# Patient Record
Sex: Female | Born: 1985 | Hispanic: No | Marital: Single | Smoking: Never smoker
Health system: Southern US, Community
[De-identification: ages and names within clinical notes are randomized; demographics above are authoritative.]

## PROBLEM LIST (undated history)

## (undated) DIAGNOSIS — N926 Irregular menstruation, unspecified: Secondary | ICD-10-CM

## (undated) DIAGNOSIS — J45909 Unspecified asthma, uncomplicated: Secondary | ICD-10-CM

## (undated) DIAGNOSIS — Z8709 Personal history of other diseases of the respiratory system: Secondary | ICD-10-CM

## (undated) DIAGNOSIS — I871 Compression of vein: Secondary | ICD-10-CM

## (undated) DIAGNOSIS — Z87442 Personal history of urinary calculi: Secondary | ICD-10-CM

## (undated) DIAGNOSIS — Z86718 Personal history of other venous thrombosis and embolism: Secondary | ICD-10-CM

## (undated) HISTORY — PX: TUBAL LIGATION: SHX77

---

## 2013-11-24 ENCOUNTER — Ambulatory Visit: Payer: Medicaid Other | Admitting: Advanced Practice Midwife

## 2014-11-09 IMAGING — US US OB TRANSVAGINAL
1 series · 14 of 28 positions shown · non-contrast
Comparison: None for this pregnancy

CLINICAL DATA: 29-year-old pregnant female with lower abdominal
pain.

EXAM:
OBSTETRIC <14 WK US AND TRANSVAGINAL OB US
TECHNIQUE: Both transabdominal and transvaginal ultrasound examinations were
performed for complete evaluation of the gestation as well as the
maternal uterus, adnexal regions, and pelvic cul-de-sac.
Transvaginal technique was performed to assess early pregnancy.

[Series 1: us ob transvaginal · 0.15mm/px · 14 of 70 slices shown]
[im 3/70]
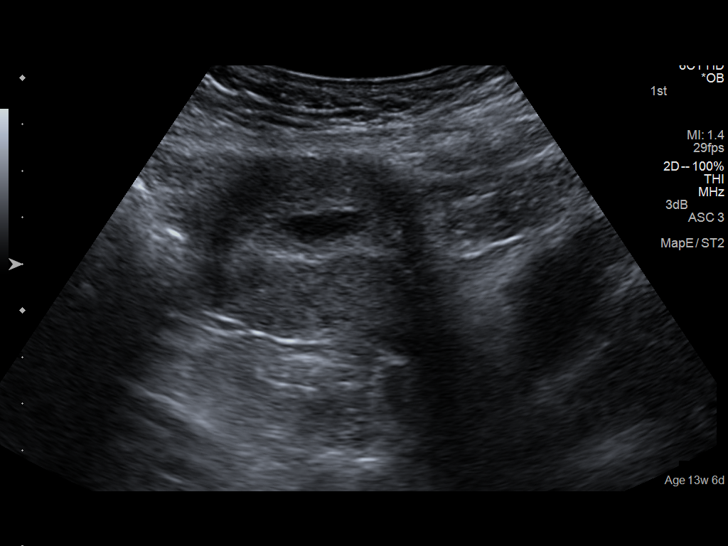
[im 8/70]
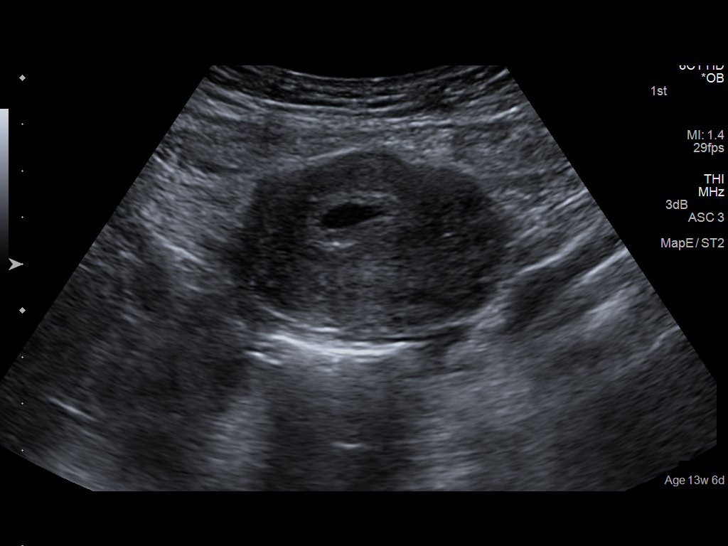
[im 13/70]
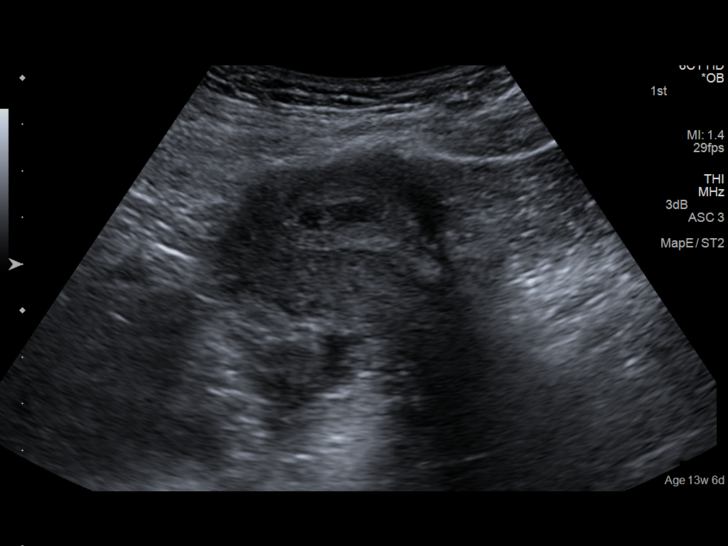
[im 18/70]
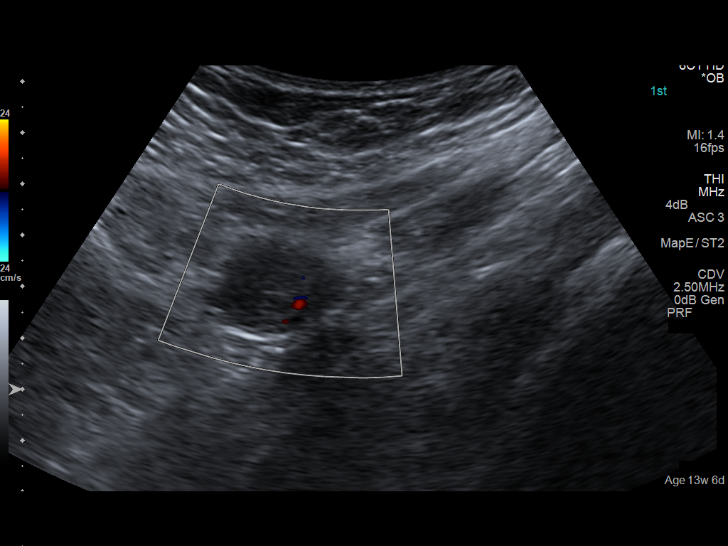
[im 24/70]
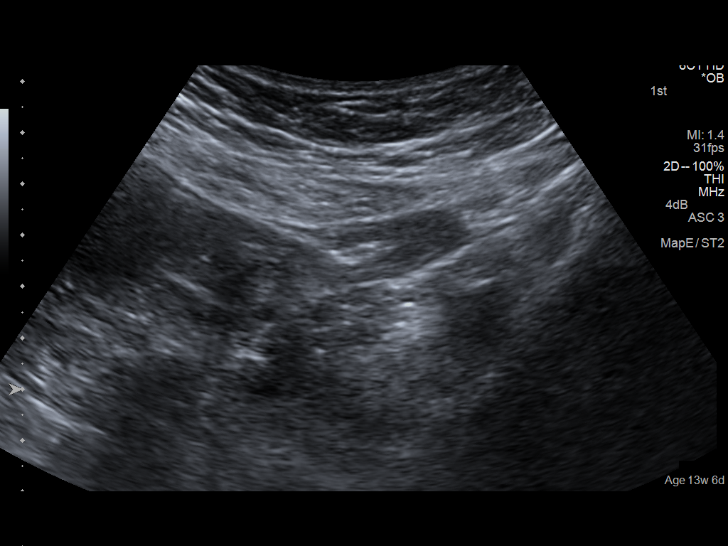
[im 29/70]
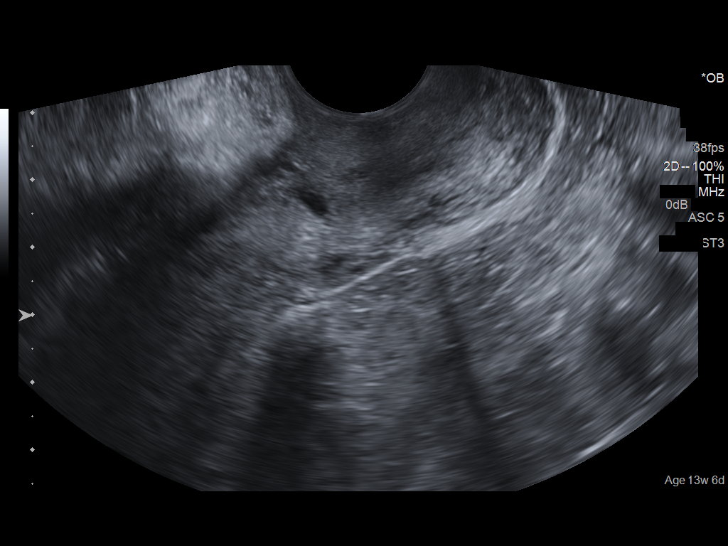
[im 34/70]
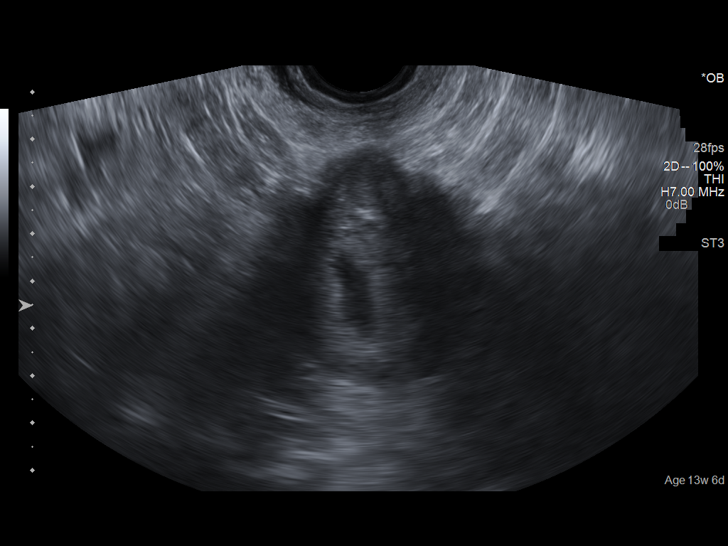
[im 39/70]
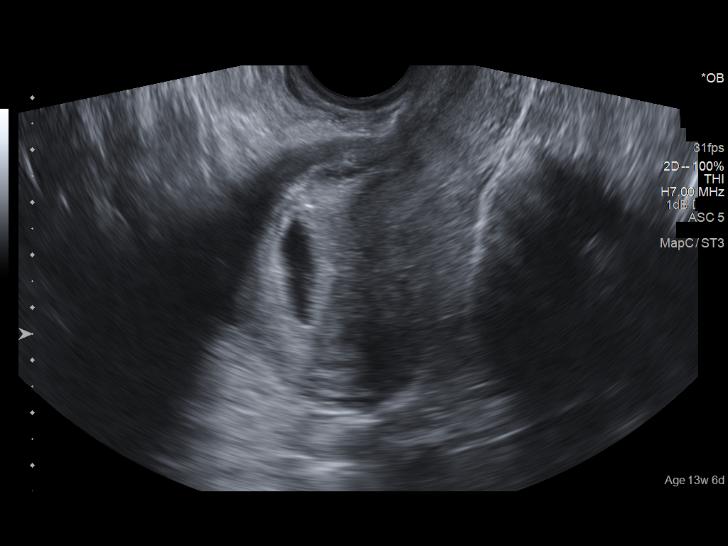
[im 44/70]
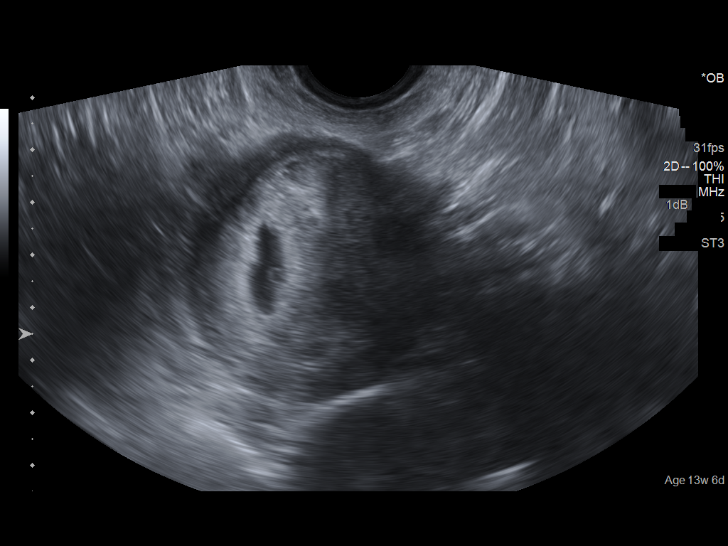
[im 49/70]
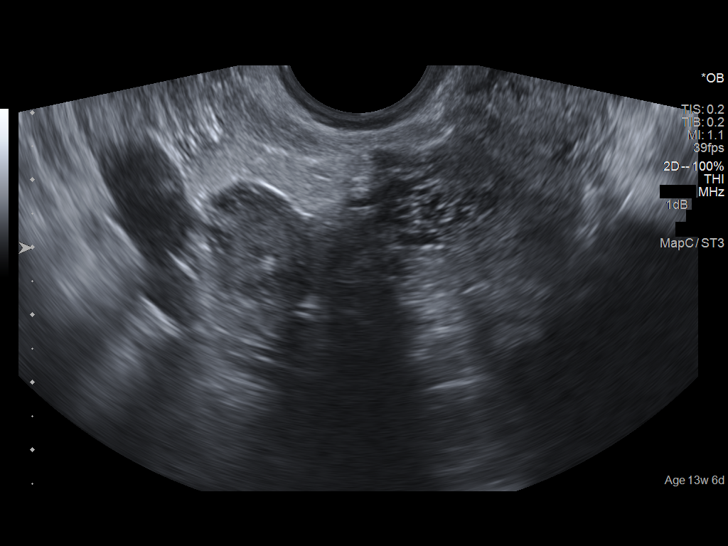
[im 54/70]
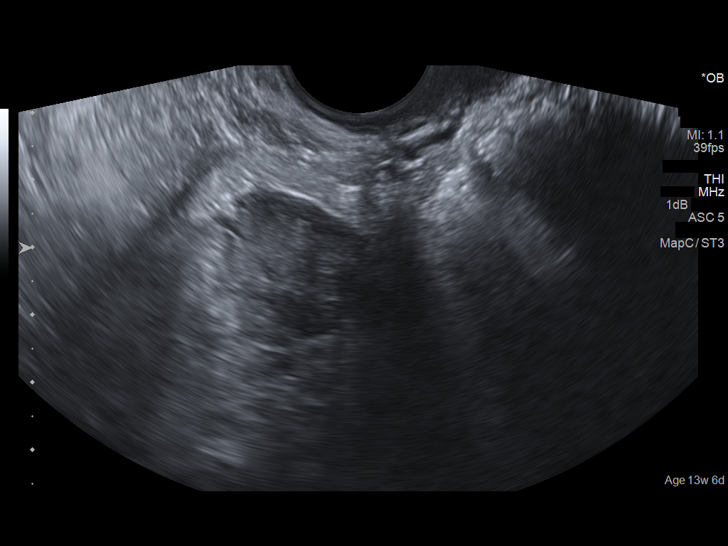
[im 59/70]
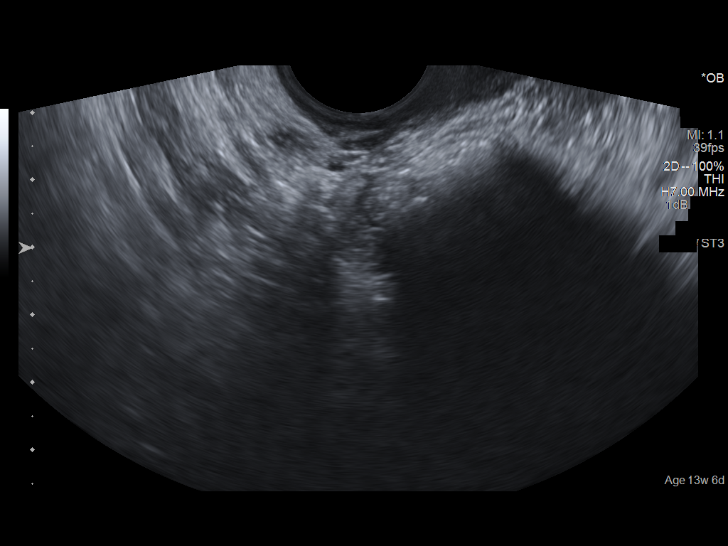
[im 64/70]
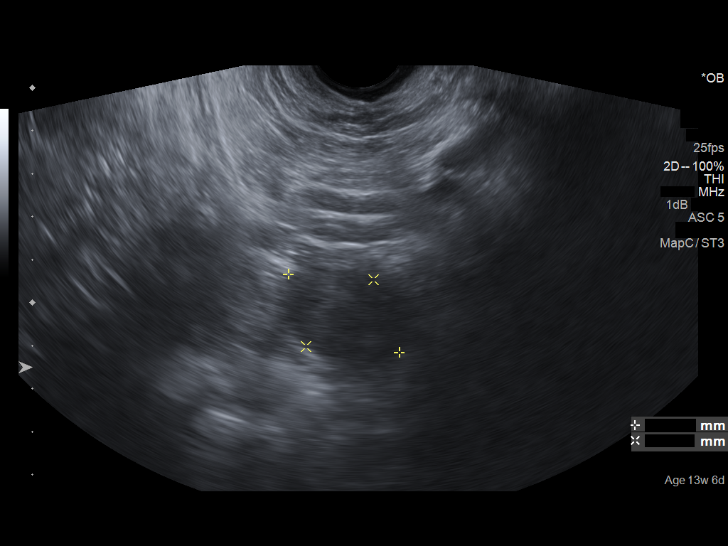
[im 70/70]
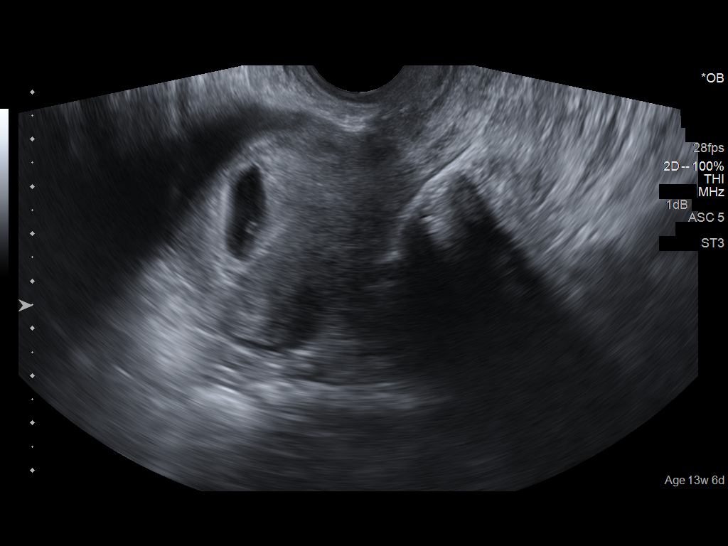

[14 of 28 positions shown; findings below may reference images not displayed]

FINDINGS: Intrauterine gestational sac: Visualized/normal in shape.

Yolk sac:  Visualized

Embryo:  Not seen

Cardiac Activity: NA

Heart Rate: NA  Bpm

MSD: 11   mm   5 w   6  d

Maternal uterus/adnexae: The uterus and the ovaries appear
unremarkable.
IMPRESSION: Single intrauterine gestational sac with an estimated gestational
age of 5 weeks, 6 days. No fetal pole identified at this time.
Follow-up recommended.

## 2014-12-01 IMAGING — US US OB COMP LESS 14 WK
1 series · 13 of 28 positions shown · non-contrast
Comparison: Seventy-[REDACTED]

CLINICAL DATA: 29-year-old pregnant female with vaginal bleeding
and pelvic pain. Estimated gestational age of 9 weeks 0 days by
first ultrasound.

EXAM:
OBSTETRIC <14 WK ULTRASOUND
TECHNIQUE: Transabdominal ultrasound was performed for evaluation of the
gestation as well as the maternal uterus and adnexal regions.

[Series 1: us ob comp less 14 wk · 0.20mm/px · 38 acquisitions, 13 frames shown]
[im 2/38]
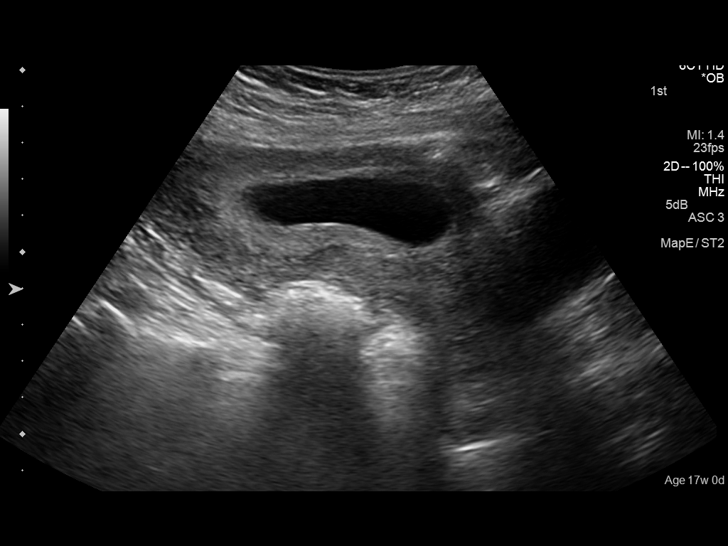
[im 5/38]
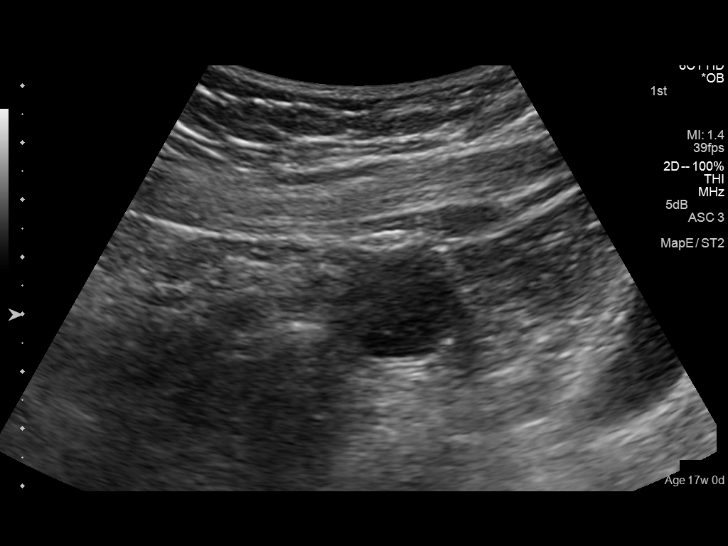
[im 7/38]
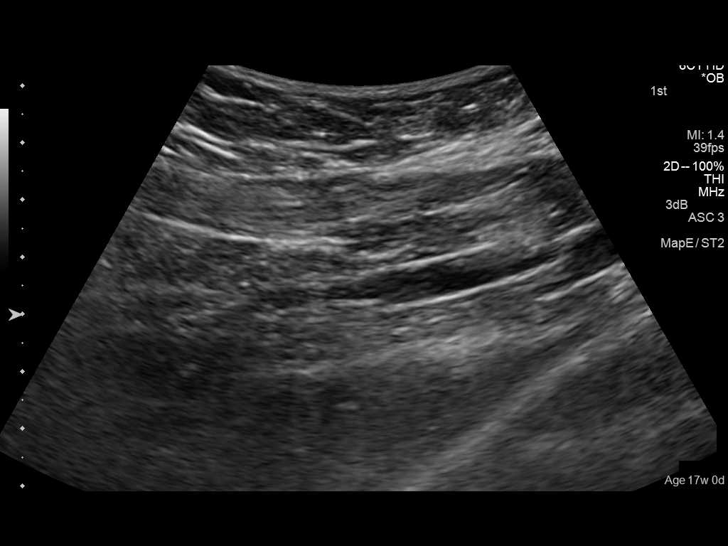
[im 10/38]
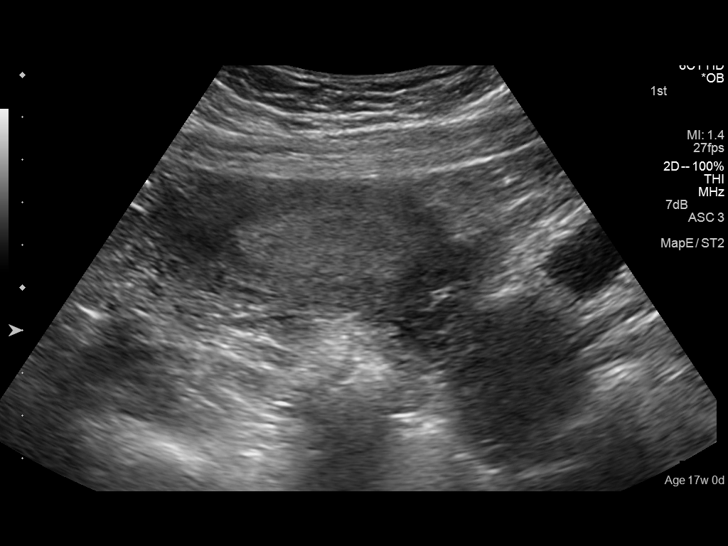
[im 13/38]
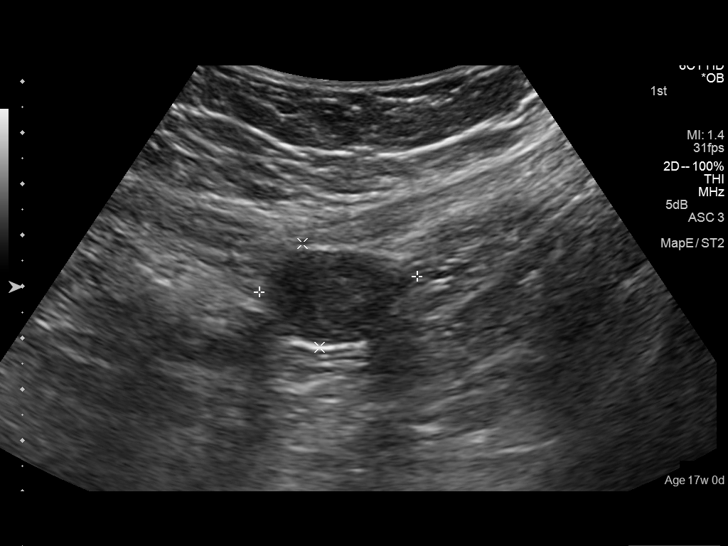
[im 16/38]
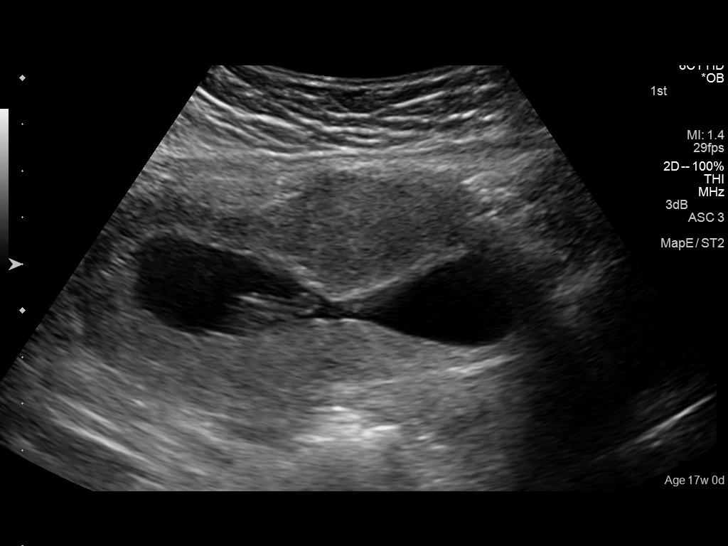
[im 20/38]
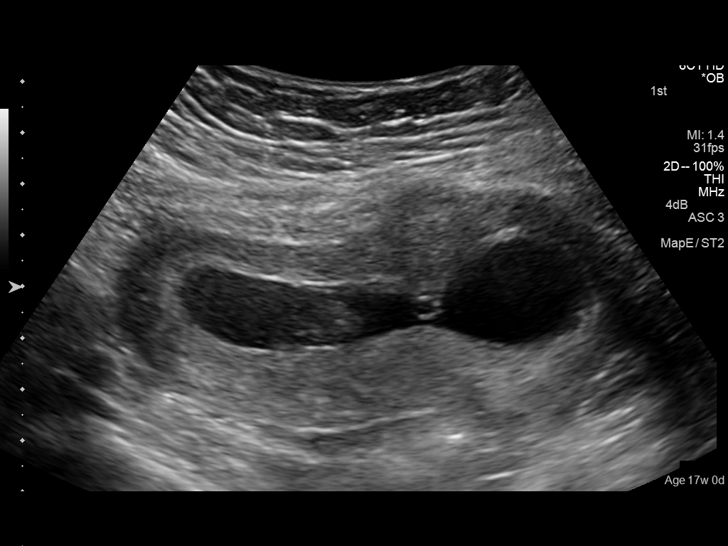
[im 22/38]
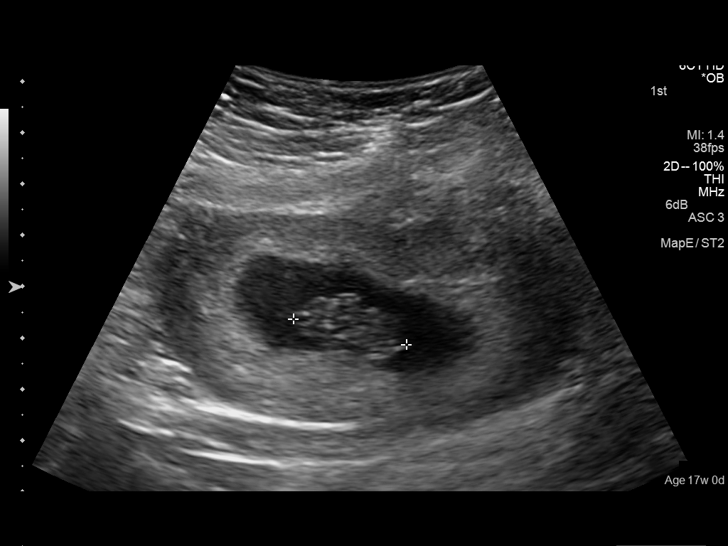
[im 25/38]
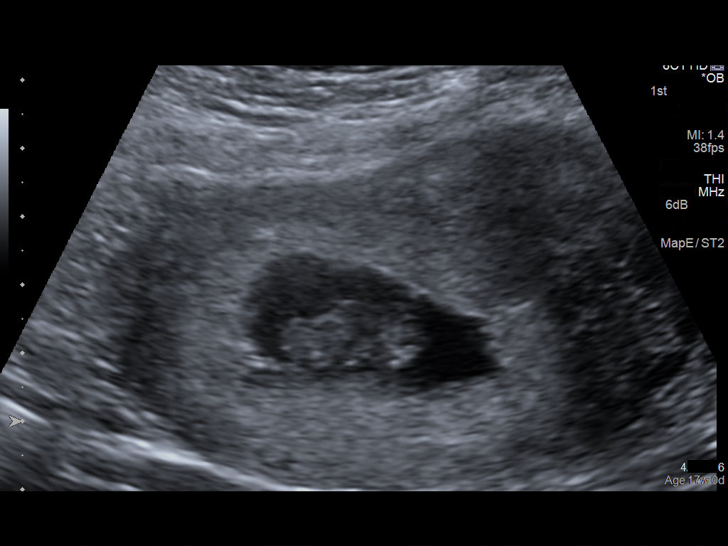
[im 28/38]
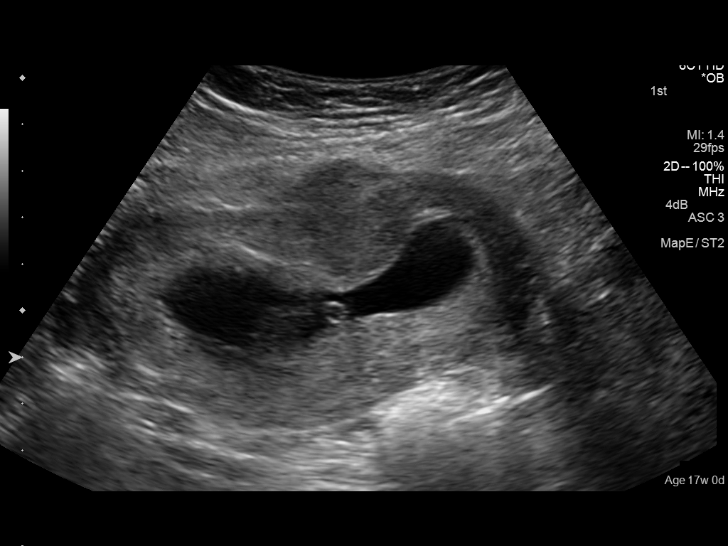
[im 31/38]
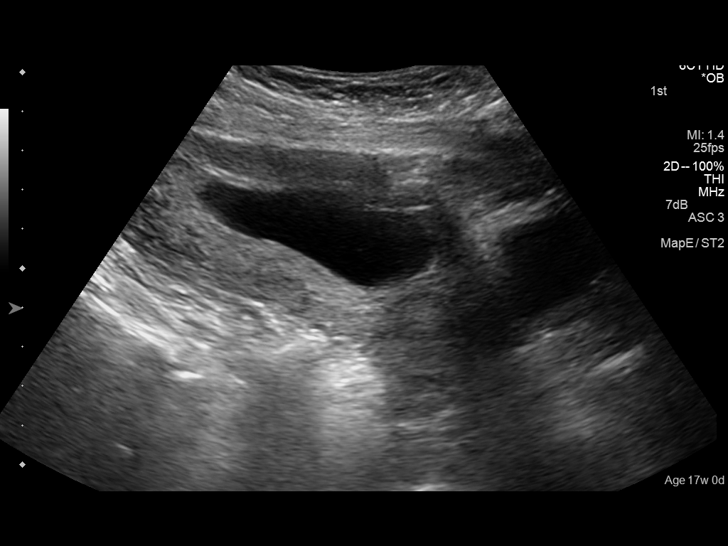
[im 33/38]
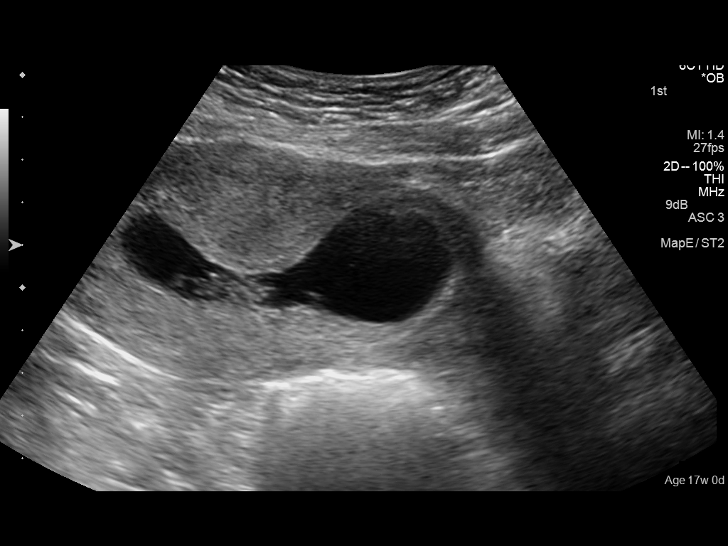
[im 36/38]
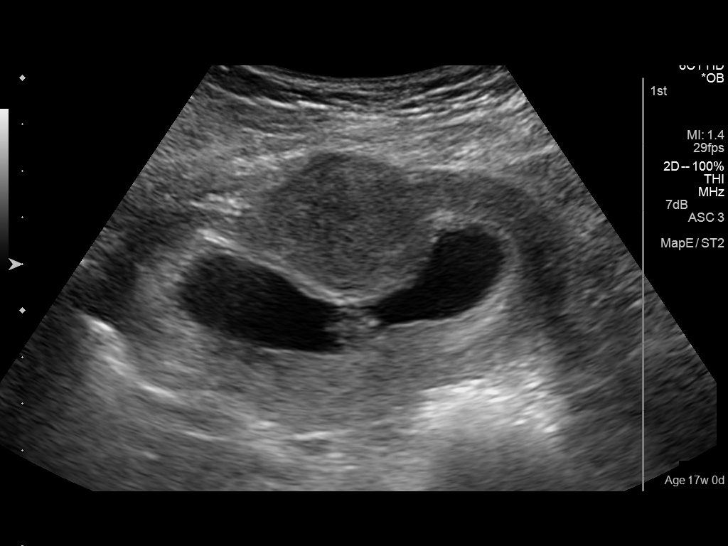

[13 of 28 positions shown; findings below may reference images not displayed]

FINDINGS: Intrauterine gestational sac: Visualized/normal in shape.

Yolk sac:  Present

Embryo:  Present

Cardiac Activity: Present

Heart Rate: 155 bpm

CRL:   22  mm   9 w 0 d                  US EDC: [DATE]

Maternal uterus/adnexae: There is no evidence of subchorionic
hemorrhage.

A 4.2 x 2.7 x 3.6 cm hypoechoic area within the anterior uterine
body does not change throughout the entire examination and most
compatible with intramural fibroid rather than a contraction.

Ovaries bilaterally are unremarkable.

No free fluid or adnexal mass identified.
IMPRESSION: Single living intrauterine gestation with assigned and estimated
gestational age of 9 weeks 0 days. No evidence of subchronic
hemorrhage.

4.2 x 2.7 x 3.8 cm probable anterior intramural intramural uterine
body fibroid. This could be further assessed on future scans.

## 2015-03-04 ENCOUNTER — Emergency Department (HOSPITAL_COMMUNITY): Payer: Medicaid Other

## 2015-03-04 ENCOUNTER — Emergency Department (HOSPITAL_COMMUNITY)
Admission: EM | Admit: 2015-03-04 | Discharge: 2015-03-04 | Disposition: A | Discharge status: Home / Self Care | Payer: Medicaid Other | Attending: Emergency Medicine | Admitting: Emergency Medicine

## 2015-03-04 ENCOUNTER — Encounter (HOSPITAL_COMMUNITY): Payer: Self-pay

## 2015-03-04 DIAGNOSIS — N202 Calculus of kidney with calculus of ureter: Secondary | ICD-10-CM | POA: Insufficient documentation

## 2015-03-04 DIAGNOSIS — N201 Calculus of ureter: Secondary | ICD-10-CM

## 2015-03-04 DIAGNOSIS — J45909 Unspecified asthma, uncomplicated: Secondary | ICD-10-CM | POA: Insufficient documentation

## 2015-03-04 DIAGNOSIS — Z3202 Encounter for pregnancy test, result negative: Secondary | ICD-10-CM | POA: Insufficient documentation

## 2015-03-04 DIAGNOSIS — R109 Unspecified abdominal pain: Secondary | ICD-10-CM | POA: Diagnosis present

## 2015-03-04 DIAGNOSIS — N200 Calculus of kidney: Secondary | ICD-10-CM

## 2015-03-04 HISTORY — DX: Unspecified asthma, uncomplicated: J45.909

## 2015-03-04 LAB — CBC WITH DIFFERENTIAL/PLATELET
BASOS PCT: 0 % (ref 0–1)
Basophils Absolute: 0 10*3/uL (ref 0.0–0.1)
EOS ABS: 0.2 10*3/uL (ref 0.0–0.7)
EOS PCT: 2 % (ref 0–5)
HCT: 39.6 % (ref 36.0–46.0)
HEMOGLOBIN: 13.2 g/dL (ref 12.0–15.0)
LYMPHS PCT: 33 % (ref 12–46)
Lymphs Abs: 3.3 10*3/uL (ref 0.7–4.0)
MCH: 31.6 pg (ref 26.0–34.0)
MCHC: 33.3 g/dL (ref 30.0–36.0)
MCV: 94.7 fL (ref 78.0–100.0)
MONO ABS: 1.1 10*3/uL — AB (ref 0.1–1.0)
MONOS PCT: 11 % (ref 3–12)
Neutro Abs: 5.5 10*3/uL (ref 1.7–7.7)
Neutrophils Relative %: 54 % (ref 43–77)
Platelets: 252 10*3/uL (ref 150–400)
RBC: 4.18 MIL/uL (ref 3.87–5.11)
RDW: 12.6 % (ref 11.5–15.5)
WBC: 10.1 10*3/uL (ref 4.0–10.5)

## 2015-03-04 LAB — URINALYSIS, ROUTINE W REFLEX MICROSCOPIC
Bilirubin Urine: NEGATIVE
GLUCOSE, UA: NEGATIVE mg/dL
Ketones, ur: NEGATIVE mg/dL
Leukocytes, UA: NEGATIVE
NITRITE: NEGATIVE
Protein, ur: NEGATIVE mg/dL
SPECIFIC GRAVITY, URINE: 1.02 (ref 1.005–1.030)
Urobilinogen, UA: 1 mg/dL (ref 0.0–1.0)
pH: 6.5 (ref 5.0–8.0)

## 2015-03-04 LAB — COMPREHENSIVE METABOLIC PANEL
ALBUMIN: 4.1 g/dL (ref 3.5–5.0)
ALT: 28 U/L (ref 14–54)
ANION GAP: 9 (ref 5–15)
AST: 23 U/L (ref 15–41)
Alkaline Phosphatase: 69 U/L (ref 38–126)
BUN: 11 mg/dL (ref 6–20)
CO2: 26 mmol/L (ref 22–32)
CREATININE: 0.66 mg/dL (ref 0.44–1.00)
Calcium: 9.1 mg/dL (ref 8.9–10.3)
Chloride: 106 mmol/L (ref 101–111)
GFR calc Af Amer: >60 mL/min (ref >60)
Glucose, Bld: 118 mg/dL — ABNORMAL HIGH (ref 65–99)
Potassium: 3.3 mmol/L — ABNORMAL LOW (ref 3.5–5.1)
Sodium: 141 mmol/L (ref 135–145)
TOTAL PROTEIN: 7.8 g/dL (ref 6.5–8.1)
Total Bilirubin: 1.2 mg/dL (ref 0.3–1.2)

## 2015-03-04 LAB — URINE MICROSCOPIC-ADD ON

## 2015-03-04 LAB — LIPASE, BLOOD: LIPASE: 23 U/L (ref 22–51)

## 2015-03-04 LAB — POC URINE PREG, ED: Preg Test, Ur: NEGATIVE

## 2015-03-04 IMAGING — CT CT RENAL STONE PROTOCOL
2 of 3 series · 16 of 34 positions shown, 18 images · non-contrast
Comparison: None.

CLINICAL DATA: Right flank pain and dysuria beginning this morning.
History of kidney stones.

EXAM:
CT ABDOMEN AND PELVIS WITHOUT CONTRAST
TECHNIQUE: Multidetector CT imaging of the abdomen and pelvis was performed
following the standard protocol without IV contrast.

[Series 3: lung · axial · 0.62mm/px · z∈[-130,-45]mm · 13 of 20 slices shown, 15 images]
[im 2/20  soft-tissue]
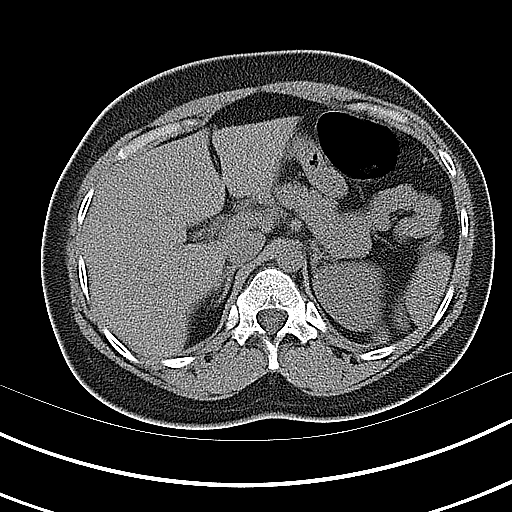
[im 2/20  bone]
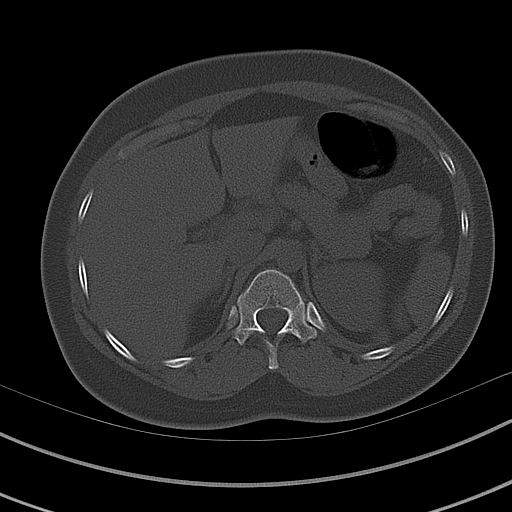
[im 4/20  soft-tissue]
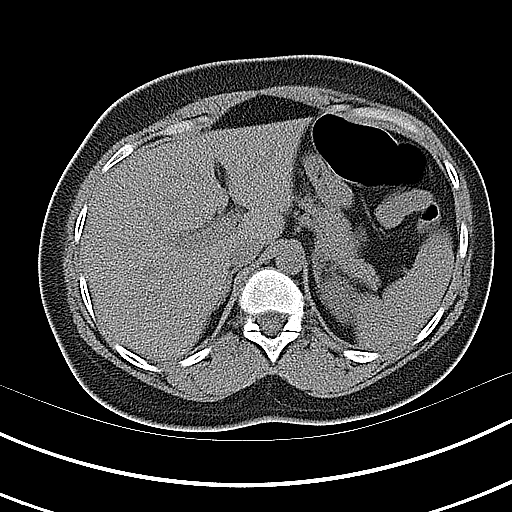
[im 5/20  soft-tissue]
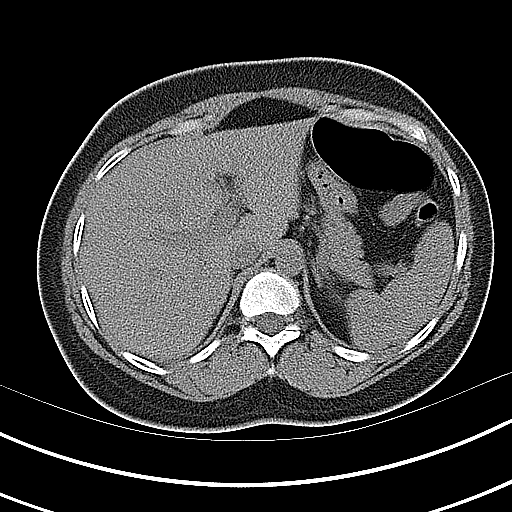
[im 6/20  soft-tissue]
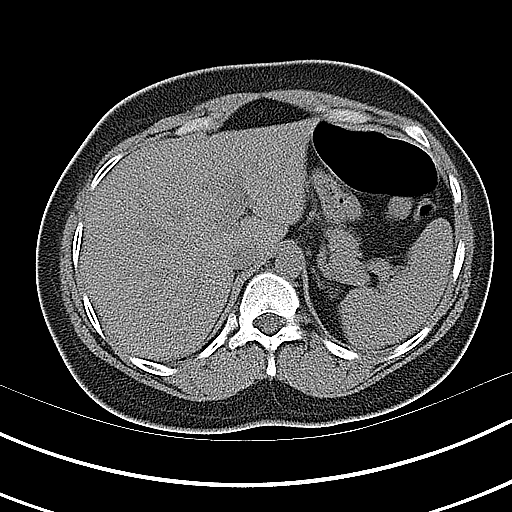
[im 8/20  soft-tissue]
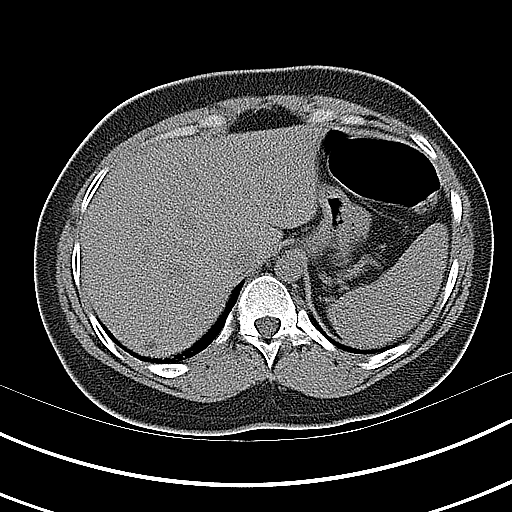
[im 9/20  soft-tissue]
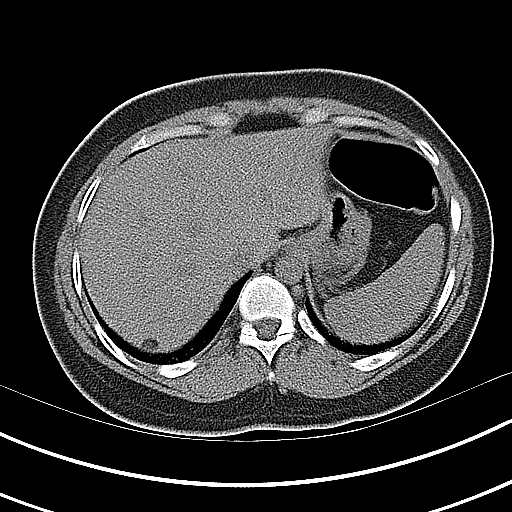
[im 11/20  soft-tissue]
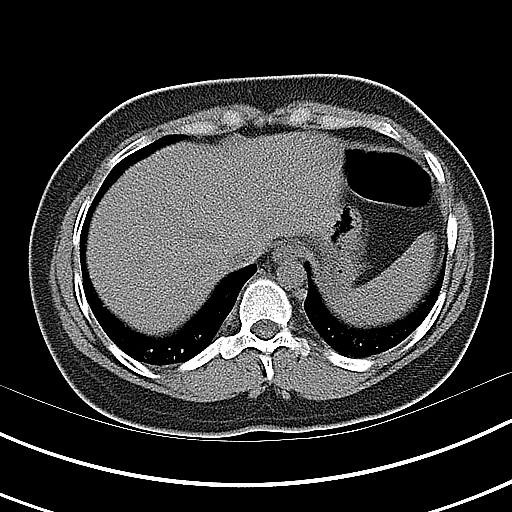
[im 12/20  soft-tissue]
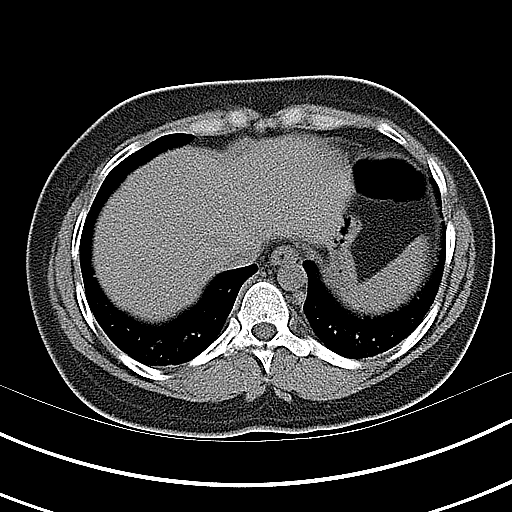
[im 13/20  soft-tissue]
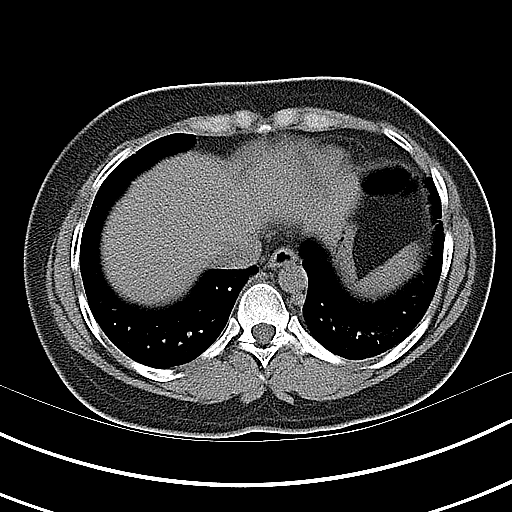
[im 13/20  bone]
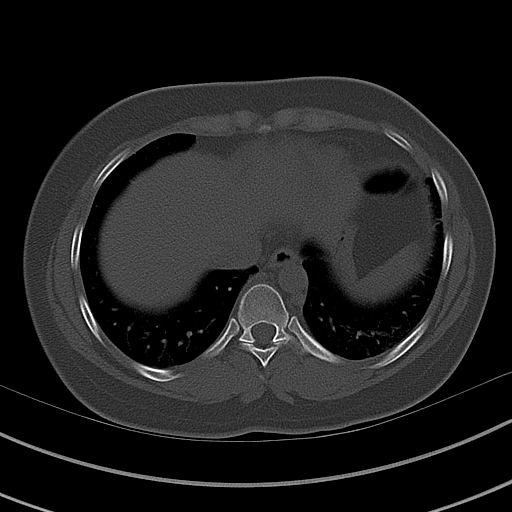
[im 15/20  soft-tissue]
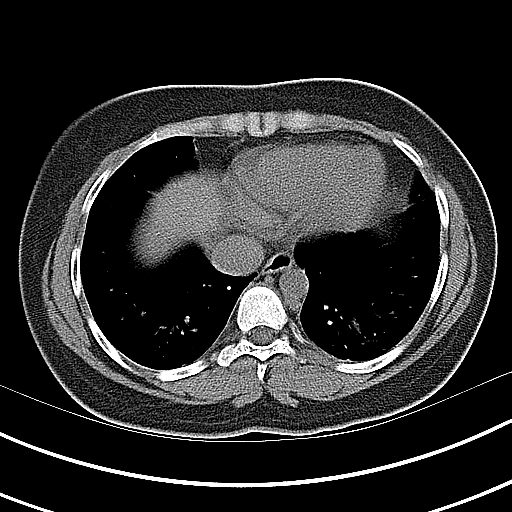
[im 16/20  soft-tissue]
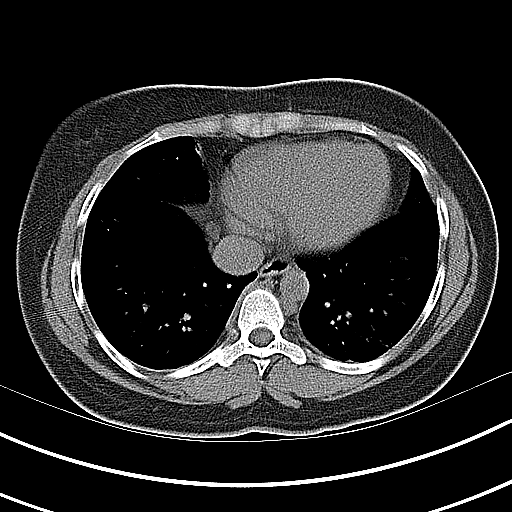
[im 17/20  soft-tissue]
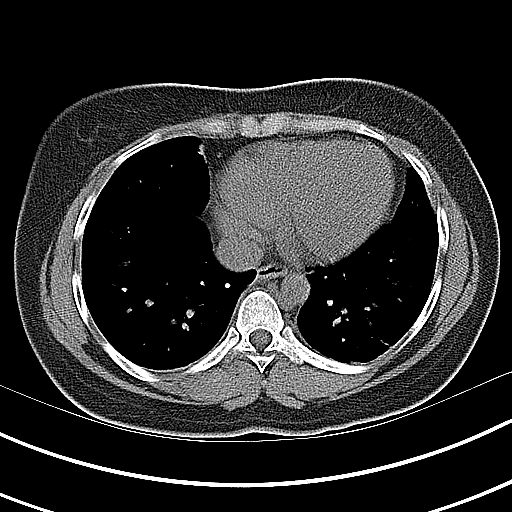
[im 19/20  soft-tissue]
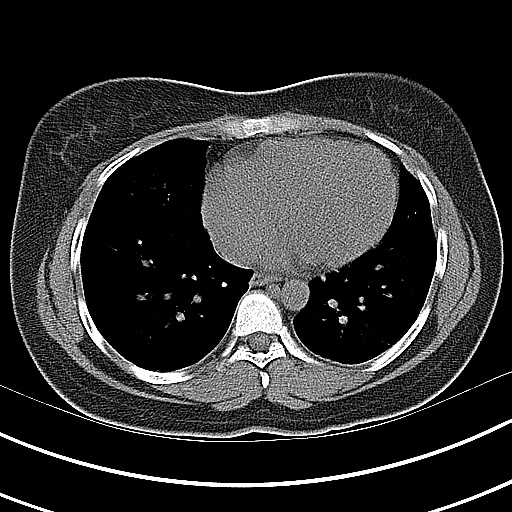

[Series 4: coronal · coronal · 0.68mm/px · 3 of 101 slices shown]
[im 34/101  soft-tissue]
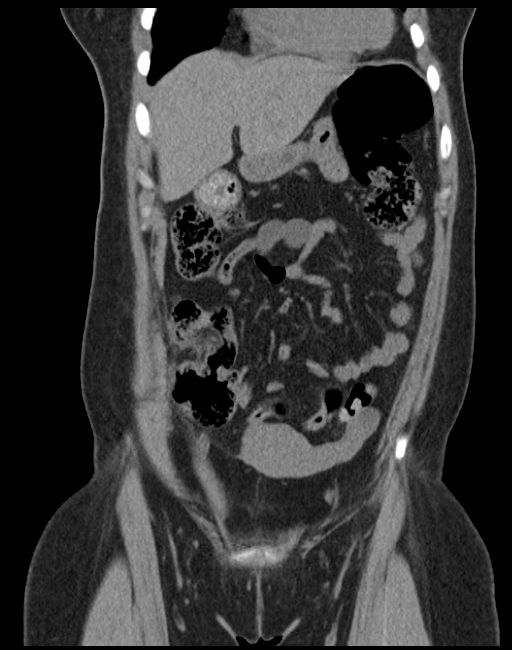
[im 45/101  soft-tissue]
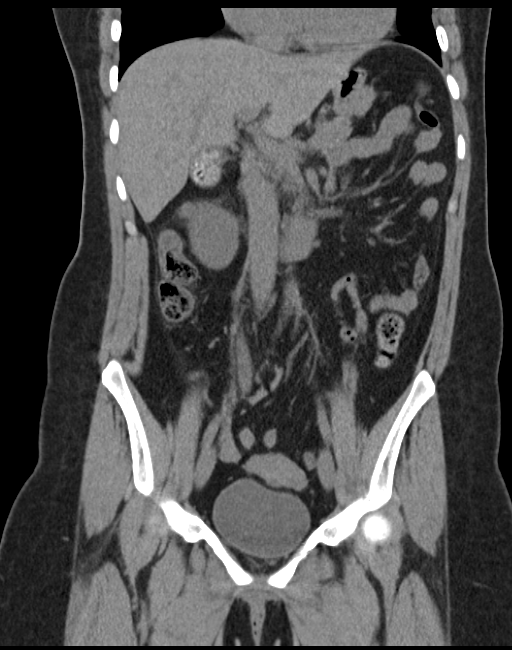
[im 56/101  soft-tissue]
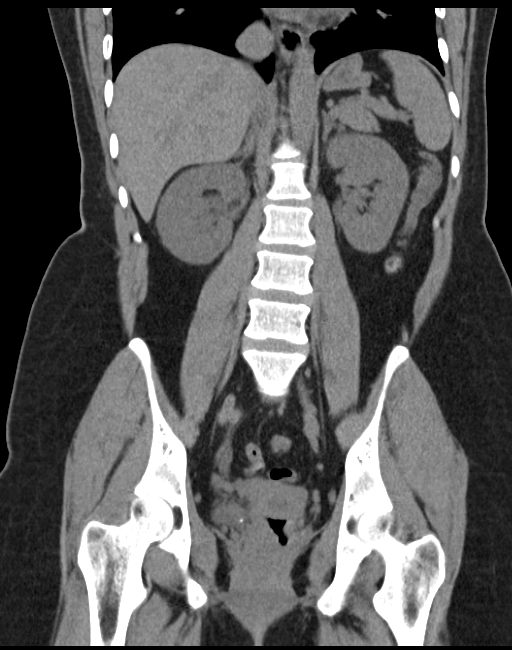

[16 of 34 positions shown; findings below may reference images not displayed]

FINDINGS: BODY WALL: No contributory findings.

LOWER CHEST: No contributory findings.

ABDOMEN/PELVIS:

Liver: Incidental 9 mm capsule pseudo lipoma posteriorly.

Biliary: Numerous gallstones. No evidence of biliary obstruction or
inflammation.

Pancreas: Unremarkable.

Spleen: Unremarkable.

Adrenals: Unremarkable.

Kidneys and ureters: Moderate right hydroureteronephrosis secondary
to a 2 mm stone at the right ureteral vesicular junction. 2 mm stone
also seen in the interpolar left kidney.

Bladder: Unremarkable.

Reproductive: The uterine fundus appears adhesed to the abdominal
wall, likely from previous Cesarean section.

Bowel: No obstruction. No appendicitis.

Retroperitoneum: No mass or adenopathy.

Peritoneum: No ascites or pneumoperitoneum.

Vascular: No acute abnormality.

OSSEOUS: No acute abnormalities.
IMPRESSION: 1. Obstructing 2 mm stone at the right ureteral vesicular junction.
2. 2 mm left renal calculus.
3. Cholelithiasis.

## 2015-03-04 MED ORDER — MORPHINE SULFATE 4 MG/ML IJ SOLN
4.0000 mg | Freq: Once | INTRAMUSCULAR | Status: AC
  Administered 2015-03-04: 4 mg via INTRAVENOUS
  Filled 2015-03-04: qty 1

## 2015-03-04 MED ORDER — FENTANYL CITRATE (PF) 100 MCG/2ML IJ SOLN
25.0000 ug | Freq: Once | INTRAMUSCULAR | Status: DC
  Filled 2015-03-04: qty 2

## 2015-03-04 MED ORDER — ONDANSETRON HCL 4 MG/2ML IJ SOLN
4.0000 mg | Freq: Once | INTRAMUSCULAR | Status: AC
  Administered 2015-03-04: 4 mg via INTRAVENOUS
  Filled 2015-03-04: qty 2

## 2015-03-04 MED ORDER — OXYCODONE-ACETAMINOPHEN 5-325 MG PO TABS: 2.0000 | ORAL_TABLET | ORAL | Status: DC | PRN

## 2015-03-04 MED ORDER — TAMSULOSIN HCL 0.4 MG PO CAPS: 0.4000 mg | ORAL_CAPSULE | Freq: Two times a day (BID) | ORAL | Status: DC

## 2015-03-04 MED ORDER — HYDROMORPHONE HCL 1 MG/ML IJ SOLN
1.0000 mg | Freq: Once | INTRAMUSCULAR | Status: AC
  Administered 2015-03-04: 1 mg via INTRAVENOUS
  Filled 2015-03-04: qty 1

## 2015-03-04 MED ORDER — ONDANSETRON 4 MG PO TBDP: ORAL_TABLET | ORAL | Status: DC

## 2015-03-04 NOTE — ED Provider Notes (Signed)
CSN: [id number]     Arrival date & time 03/04/15  [id number] History   None    Chief Complaint  Patient presents with  . Flank Pain     (Consider location/radiation/quality/duration/timing/severity/associated sxs/prior Treatment) HPI Paula Warner is a 29 y.o. female who comes in for evaluation of constant right-sided flank pain. Patient states his pain started at approximately 4 AM. She characterizes sensation is a tightness. She rates the pain as 10/10. She reports having a history of kidney stones and is unsure if this pain is similar. She denies dysuria, but does report "I feel like I have to strain to pee". She denies any abdominal pain, vaginal bleeding or discharge. She has not tried anything to improve her symptoms. Nothing makes it better or worse. She does endorse difficulty finding a comfortable position.  Past Medical History  Diagnosis Date  . Asthma    History reviewed. No pertinent past surgical history. History reviewed. No pertinent family history. History  Substance Use Topics  . Smoking status: Never Smoker   . Smokeless tobacco: Not on file  . Alcohol Use: No   OB History    No data available     Review of Systems A 10 point review of systems was completed and was negative except for pertinent positives and negatives as mentioned in the history of present illness    Allergies  Review of patient's allergies indicates no known allergies.  Home Medications   Prior to Admission medications   Not on File   BP 136/97 mmHg  Pulse 98  Resp 20  SpO2 100%  LMP 02/01/2015 Physical Exam  Constitutional: She is oriented to person, place, and time. She appears well-developed and well-nourished.  HENT:  Head: Normocephalic and atraumatic.  Mouth/Throat: Oropharynx is clear and moist.  Eyes: Conjunctivae are normal. Pupils are equal, round, and reactive to light. Right eye exhibits no discharge. Left eye exhibits no discharge. No scleral icterus.  Neck: Neck supple.   Cardiovascular: Normal rate, regular rhythm and normal heart sounds.   Pulmonary/Chest: Effort normal and breath sounds normal. No respiratory distress. She has no wheezes. She has no rales.  Abdominal: Soft. There is no tenderness.  No CVA tenderness. Abdomen is soft, nondistended without tenderness, rebound or guarding. No lesions, masses or other deformities noted.  Musculoskeletal: She exhibits no tenderness.  No obvious lesions or deformities over right flank. No tenderness to palpation.  Neurological: She is alert and oriented to person, place, and time.  Cranial Nerves II-XII grossly intact  Skin: Skin is warm and dry. No rash noted.  Psychiatric: She has a normal mood and affect.  Nursing note and vitals reviewed.   ED Course  Procedures (including critical care time) Labs Review Labs Reviewed  CBC WITH DIFFERENTIAL/PLATELET - Abnormal; Notable for the following:    Monocytes Absolute 1.1 (*)    All other components within normal limits  COMPREHENSIVE METABOLIC PANEL - Abnormal; Notable for the following:    Potassium 3.3 (*)    Glucose, Bld 118 (*)    All other components within normal limits  URINALYSIS, ROUTINE W REFLEX MICROSCOPIC (NOT AT [facility]) - Abnormal; Notable for the following:    APPearance CLOUDY (*)    Hgb urine dipstick MODERATE (*)    All other components within normal limits  URINE MICROSCOPIC-ADD ON - Abnormal; Notable for the following:    Squamous Epithelial / LPF MANY (*)    All other components within normal limits  LIPASE, BLOOD  POC  URINE PREG, ED    Imaging Review Ct Renal Stone Study  03/04/2015   CLINICAL DATA:  Right flank pain and dysuria beginning this morning. History of kidney stones.  EXAM: CT ABDOMEN AND PELVIS WITHOUT CONTRAST  TECHNIQUE: Multidetector CT imaging of the abdomen and pelvis was performed following the standard protocol without IV contrast.  COMPARISON:  None.  FINDINGS: BODY WALL: No contributory findings.  LOWER CHEST: No  contributory findings.  ABDOMEN/PELVIS:  Liver: Incidental 9 mm capsule pseudo lipoma posteriorly.  Biliary: Numerous gallstones. No evidence of biliary obstruction or inflammation.  Pancreas: Unremarkable.  Spleen: Unremarkable.  Adrenals: Unremarkable.  Kidneys and ureters: Moderate right hydroureteronephrosis secondary to a 2 mm stone at the right ureteral vesicular junction. 2 mm stone also seen in the interpolar left kidney.  Bladder: Unremarkable.  Reproductive: The uterine fundus appears adhesed to the abdominal wall, likely from previous Cesarean section.  Bowel: No obstruction. No appendicitis.  Retroperitoneum: No mass or adenopathy.  Peritoneum: No ascites or pneumoperitoneum.  Vascular: No acute abnormality.  OSSEOUS: No acute abnormalities.  IMPRESSION: 1. Obstructing 2 mm stone at the right ureteral vesicular junction. 2. 2 mm left renal calculus. 3. Cholelithiasis.   Electronically Signed   By: Monte Fantasia M.D.   On: 03/04/2015 08:11     EKG Interpretation None     Meds given in ED:  Medications  ondansetron [facility]) injection 4 mg (4 mg Intravenous Given 03/04/15 0617)  morphine 4 MG/ML injection 4 mg (4 mg Intravenous Given 03/04/15 0617)  HYDROmorphone (DILAUDID) injection 1 mg (1 mg Intravenous Given 03/04/15 0654)    New Prescriptions   No medications on file   Filed Vitals:   03/04/15 0547  BP: 136/97  Pulse: 98  Resp: 20  SpO2: 100%    MDM  Vitals stable - WNL -afebrile Pt resting comfortably in ED. Pain controlled in ED and pt feels much better PE--benign abdominal exam. No CVA tenderness. Person on physical exam. Labwork--hemoglobin evident on urinalysis. No UTI. Labs otherwise noncontributory. Imaging--CT renal stone study shows 2 mm obstructing stone at the right UVJ, 2 mm stone also seen in the interpolar left kidney. Also evidence of leak cholelithiasis.  DDX--patient presents with right-sided flank pain found to have 2 mm obstructing stone at right UVJ.  No evidence of urosepsis. Vital signs stable. Pain controlled in the ED. We'll discharge with pain medicines, antiemetics and referral to follow up with urology. No evidence of other acute or emergent pathology at this time.  I discussed all relevant lab findings and imaging results with pt and they verbalized understanding. Discussed f/u with PCP within 48 hrs and return precautions, pt very amenable to plan.  Final diagnoses:  Ureterolithiasis  Nephrolith       Comer Locket, PA-C 03/04/15 2100  Merryl Hacker, MD 03/05/15 [phone]

## 2015-03-04 NOTE — ED Notes (Signed)
Pt complains of right flank pain that started a few hours ago, hx of kidney stones and she states this pain is different. Pt is nauseated and is currently vomiting

## 2015-03-04 NOTE — ED Notes (Signed)
Patient ambulatory from bathroom back to patient room. C/o no relief of flank pain.

## 2015-03-04 NOTE — Discharge Instructions (Signed)
You were evaluated in the ER today for your right-sided flank pain. Your found to have kidney stones. It is important for you to take these medications as prescribed. Please follow-up with urology for further evaluation and management of your symptoms. Return to ED for new or worsening symptoms.  Kidney Stones Kidney stones (urolithiasis) are solid masses that form inside your kidneys. The intense pain is caused by the stone moving through the kidney, ureter, bladder, and urethra (urinary tract). When the stone moves, the ureter starts to spasm around the stone. The stone is usually passed in your pee (urine).  HOME CARE  Drink enough fluids to keep your pee clear or pale yellow. This helps to get the stone out.  Strain all pee through the provided strainer. Do not pee without peeing through the strainer, not even once. If you pee the stone out, catch it in the strainer. The stone may be as small as a grain of salt. Take this to your doctor. This will help your doctor figure out what you can do to try to prevent more kidney stones.  Only take medicine as told by your doctor.  Follow up with your doctor as told.  Get follow-up X-rays as told by your doctor. GET HELP IF: You have pain that gets worse even if you have been taking pain medicine. GET HELP RIGHT AWAY IF:   Your pain does not get better with medicine.  You have a fever or shaking chills.  Your pain increases and gets worse over 18 hours.  You have new belly (abdominal) pain.  You feel faint or pass out.  You are unable to pee. MAKE SURE YOU:   Understand these instructions.  Will watch your condition.  Will get help right away if you are not doing well or get worse. Document Released: 02/27/2008 Document Revised: 05/13/2013 Document Reviewed: 02/11/2013 [facility] Patient Information 2015 [place], [place]. This information is not intended to replace advice given to you by your health care provider. Make sure you discuss  any questions you have with your health care provider.

## 2015-04-01 ENCOUNTER — Emergency Department (HOSPITAL_COMMUNITY)
Admission: EM | Admit: 2015-04-01 | Discharge: 2015-04-01 | Disposition: A | Discharge status: Home / Self Care | Payer: Medicaid Other | Attending: Emergency Medicine | Admitting: Emergency Medicine

## 2015-04-01 ENCOUNTER — Emergency Department (HOSPITAL_COMMUNITY): Payer: Medicaid Other

## 2015-04-01 ENCOUNTER — Encounter (HOSPITAL_COMMUNITY): Payer: Self-pay | Admitting: Emergency Medicine

## 2015-04-01 DIAGNOSIS — R109 Unspecified abdominal pain: Secondary | ICD-10-CM | POA: Insufficient documentation

## 2015-04-01 DIAGNOSIS — O210 Mild hyperemesis gravidarum: Secondary | ICD-10-CM | POA: Diagnosis not present

## 2015-04-01 DIAGNOSIS — Z3A01 Less than 8 weeks gestation of pregnancy: Secondary | ICD-10-CM | POA: Diagnosis not present

## 2015-04-01 DIAGNOSIS — O99511 Diseases of the respiratory system complicating pregnancy, first trimester: Secondary | ICD-10-CM | POA: Diagnosis not present

## 2015-04-01 DIAGNOSIS — Z3490 Encounter for supervision of normal pregnancy, unspecified, unspecified trimester: Secondary | ICD-10-CM

## 2015-04-01 DIAGNOSIS — R110 Nausea: Secondary | ICD-10-CM

## 2015-04-01 DIAGNOSIS — J45909 Unspecified asthma, uncomplicated: Secondary | ICD-10-CM | POA: Insufficient documentation

## 2015-04-01 DIAGNOSIS — O9989 Other specified diseases and conditions complicating pregnancy, childbirth and the puerperium: Secondary | ICD-10-CM | POA: Diagnosis present

## 2015-04-01 LAB — URINALYSIS, ROUTINE W REFLEX MICROSCOPIC
BILIRUBIN URINE: NEGATIVE
Glucose, UA: NEGATIVE mg/dL
Hgb urine dipstick: NEGATIVE
NITRITE: NEGATIVE
Protein, ur: NEGATIVE mg/dL
Specific Gravity, Urine: 1.027 (ref 1.005–1.030)
Urobilinogen, UA: 1 mg/dL (ref 0.0–1.0)
pH: 6 (ref 5.0–8.0)

## 2015-04-01 LAB — URINE MICROSCOPIC-ADD ON

## 2015-04-01 LAB — WET PREP, GENITAL
Clue Cells Wet Prep HPF POC: NONE SEEN
TRICH WET PREP: NONE SEEN
Yeast Wet Prep HPF POC: NONE SEEN

## 2015-04-01 LAB — HCG, QUANTITATIVE, PREGNANCY: hCG, Beta Chain, Quant, S: 26354 m[IU]/mL — ABNORMAL HIGH (ref <5)

## 2015-04-01 LAB — I-STAT BETA HCG BLOOD, ED (MC, WL, AP ONLY): I-stat hCG, quantitative: >2000 m[IU]/mL — ABNORMAL HIGH (ref <5)

## 2015-04-01 LAB — POC URINE PREG, ED: PREG TEST UR: POSITIVE — AB

## 2015-04-01 NOTE — ED Notes (Signed)
MD and PA at bedside with ultrasound

## 2015-04-01 NOTE — Discharge Instructions (Signed)
Your ultrasound showed a 5 week 6 day fetus in your uterus but no heart beat today. This needs to be followed up with at the [facility] outpatient clinic.   First Trimester of Pregnancy The first trimester of pregnancy is from week 1 until the end of week 12 (months 1 through 3). A week after a sperm fertilizes an egg, the egg will implant on the wall of the uterus. This embryo will begin to develop into a baby. Genes from you and your partner are forming the baby. The female genes determine whether the baby is a boy or a girl. At 6-8 weeks, the eyes and face are formed, and the heartbeat can be seen on ultrasound. At the end of 12 weeks, all the baby's organs are formed.  Now that you are pregnant, you will want to do everything you can to have a healthy baby. Two of the most important things are to get good prenatal care and to follow your health care provider's instructions. Prenatal care is all the medical care you receive before the baby's birth. This care will help prevent, find, and treat any problems during the pregnancy and childbirth. BODY CHANGES Your body goes through many changes during pregnancy. The changes vary from woman to woman.   You may gain or lose a couple of pounds at first.  You may feel sick to your stomach (nauseous) and throw up (vomit). If the vomiting is uncontrollable, call your health care provider.  You may tire easily.  You may develop headaches that can be relieved by medicines approved by your health care provider.  You may urinate more often. Painful urination may mean you have a bladder infection.  You may develop heartburn as a result of your pregnancy.  You may develop constipation because certain hormones are causing the muscles that push waste through your intestines to slow down.  You may develop hemorrhoids or swollen, bulging veins (varicose veins).  Your breasts may begin to grow larger and become tender. Your nipples may stick out more, and the tissue  that surrounds them (areola) may become darker.  Your gums may bleed and may be sensitive to brushing and flossing.  Dark spots or blotches (chloasma, mask of pregnancy) may develop on your face. This will likely fade after the baby is born.  Your menstrual periods will stop.  You may have a loss of appetite.  You may develop cravings for certain kinds of food.  You may have changes in your emotions from day to day, such as being excited to be pregnant or being concerned that something may go wrong with the pregnancy and baby.  You may have more vivid and strange dreams.  You may have changes in your hair. These can include thickening of your hair, rapid growth, and changes in texture. Some women also have hair loss during or after pregnancy, or hair that feels dry or thin. Your hair will most likely return to normal after your baby is born. WHAT TO EXPECT AT YOUR PRENATAL VISITS During a routine prenatal visit:  You will be weighed to make sure you and the baby are growing normally.  Your blood pressure will be taken.  Your abdomen will be measured to track your baby's growth.  The fetal heartbeat will be listened to starting around week 10 or 12 of your pregnancy.  Test results from any previous visits will be discussed. Your health care provider may ask you:  How you are feeling.  If you  are feeling the baby move.  If you have had any abnormal symptoms, such as leaking fluid, bleeding, severe headaches, or abdominal cramping.  If you have any questions. Other tests that may be performed during your first trimester include:  Blood tests to find your blood type and to check for the presence of any previous infections. They will also be used to check for low iron levels (anemia) and Rh antibodies. Later in the pregnancy, blood tests for diabetes will be done along with other tests if problems develop.  Urine tests to check for infections, diabetes, or protein in the  urine.  An ultrasound to confirm the proper growth and development of the baby.  An amniocentesis to check for possible genetic problems.  Fetal screens for spina bifida and Down syndrome.  You may need other tests to make sure you and the baby are doing well. HOME CARE INSTRUCTIONS  Medicines  Follow your health care provider's instructions regarding medicine use. Specific medicines may be either safe or unsafe to take during pregnancy.  Take your prenatal vitamins as directed.  If you develop constipation, try taking a stool softener if your health care provider approves. Diet  Eat regular, well-balanced meals. Choose a variety of foods, such as meat or vegetable-based protein, fish, milk and low-fat dairy products, vegetables, fruits, and whole grain breads and cereals. Your health care provider will help you determine the amount of weight gain that is right for you.  Avoid raw meat and uncooked cheese. These carry germs that can cause birth defects in the baby.  Eating four or five small meals rather than three large meals a day may help relieve nausea and vomiting. If you start to feel nauseous, eating a few soda crackers can be helpful. Drinking liquids between meals instead of during meals also seems to help nausea and vomiting.  If you develop constipation, eat more high-fiber foods, such as fresh vegetables or fruit and whole grains. Drink enough fluids to keep your urine clear or pale yellow. Activity and Exercise  Exercise only as directed by your health care provider. Exercising will help you:  Control your weight.  Stay in shape.  Be prepared for labor and delivery.  Experiencing pain or cramping in the lower abdomen or low back is a good sign that you should stop exercising. Check with your health care provider before continuing normal exercises.  Try to avoid standing for long periods of time. Move your legs often if you must stand in one place for a long  time.  Avoid heavy lifting.  Wear low-heeled shoes, and practice good posture.  You may continue to have sex unless your health care provider directs you otherwise. Relief of Pain or Discomfort  Wear a good support bra for breast tenderness.   Take warm sitz baths to soothe any pain or discomfort caused by hemorrhoids. Use hemorrhoid cream if your health care provider approves.   Rest with your legs elevated if you have leg cramps or low back pain.  If you develop varicose veins in your legs, wear support hose. Elevate your feet for 15 minutes, 3-4 times a day. Limit salt in your diet. Prenatal Care  Schedule your prenatal visits by the twelfth week of pregnancy. They are usually scheduled monthly at first, then more often in the last 2 months before delivery.  Write down your questions. Take them to your prenatal visits.  Keep all your prenatal visits as directed by your health care provider. Safety  Wear your seat belt at all times when driving.  Make a list of emergency phone numbers, including numbers for family, friends, the hospital, and police and fire departments. General Tips  Ask your health care provider for a referral to a local prenatal education class. Begin classes no later than at the beginning of month 6 of your pregnancy.  Ask for help if you have counseling or nutritional needs during pregnancy. Your health care provider can offer advice or refer you to specialists for help with various needs.  Do not use hot tubs, steam rooms, or saunas.  Do not douche or use tampons or scented sanitary pads.  Do not cross your legs for long periods of time.  Avoid cat litter boxes and soil used by cats. These carry germs that can cause birth defects in the baby and possibly loss of the fetus by miscarriage or stillbirth.  Avoid all smoking, herbs, alcohol, and medicines not prescribed by your health care provider. Chemicals in these affect the formation and growth of  the baby.  Schedule a dentist appointment. At home, brush your teeth with a soft toothbrush and be gentle when you floss. SEEK MEDICAL CARE IF:   You have dizziness.  You have mild pelvic cramps, pelvic pressure, or nagging pain in the abdominal area.  You have persistent nausea, vomiting, or diarrhea.  You have a bad smelling vaginal discharge.  You have pain with urination.  You notice increased swelling in your face, hands, legs, or ankles. SEEK IMMEDIATE MEDICAL CARE IF:   You have a fever.  You are leaking fluid from your vagina.  You have spotting or bleeding from your vagina.  You have severe abdominal cramping or pain.  You have rapid weight gain or loss.  You vomit blood or material that looks like coffee grounds.  You are exposed to [place] measles and have never had them.  You are exposed to fifth disease or chickenpox.  You develop a severe headache.  You have shortness of breath.  You have any kind of trauma, such as from a fall or a car accident. Document Released: 09/04/2001 Document Revised: 01/25/2014 Document Reviewed: 07/21/2013 [facility] Patient Information 2015 [place], [place]. This information is not intended to replace advice given to you by your health care provider. Make sure you discuss any questions you have with your health care provider. Morning Sickness Morning sickness is when you feel sick to your stomach (nauseous) during pregnancy. This nauseous feeling may or may not come with vomiting. It often occurs in the morning but can be a problem any time of day. Morning sickness is most common during the first trimester, but it may continue throughout pregnancy. While morning sickness is unpleasant, it is usually harmless unless you develop severe and continual vomiting (hyperemesis gravidarum). This condition requires more intense treatment.  CAUSES  The cause of morning sickness is not completely known but seems to be related to normal hormonal  changes that occur in pregnancy. RISK FACTORS You are at greater risk if you:  Experienced nausea or vomiting before your pregnancy.  Had morning sickness during a previous pregnancy.  Are pregnant with more than one baby, such as twins. TREATMENT  Do not use any medicines (prescription, over-the-counter, or herbal) for morning sickness without first talking to your health care provider. Your health care provider may prescribe or recommend:  Vitamin B6 supplements.  Anti-nausea medicines.  The herbal medicine ginger. HOME CARE INSTRUCTIONS   Only take over-the-counter or prescription medicines  as directed by your health care provider.  Taking multivitamins before getting pregnant can prevent or decrease the severity of morning sickness in most women.  Eat a piece of dry toast or unsalted crackers before getting out of bed in the morning.  Eat five or six small meals a day.  Eat dry and bland foods (rice, baked potato). Foods high in carbohydrates are often helpful.  Do not drink liquids with your meals. Drink liquids between meals.  Avoid greasy, fatty, and spicy foods.  Get someone to cook for you if the smell of any food causes nausea and vomiting.  If you feel nauseous after taking prenatal vitamins, take the vitamins at night or with a snack.  Snack on protein foods (nuts, yogurt, cheese) between meals if you are hungry.  Eat unsweetened gelatins for desserts.  Wearing an acupressure wristband (worn for sea sickness) may be helpful.  Acupuncture may be helpful.  Do not smoke.  Get a humidifier to keep the air in your house free of odors.  Get plenty of fresh air. SEEK MEDICAL CARE IF:   Your home remedies are not working, and you need medicine.  You feel dizzy or lightheaded.  You are losing weight. SEEK IMMEDIATE MEDICAL CARE IF:   You have persistent and uncontrolled nausea and vomiting.  You pass out (faint). MAKE SURE YOU:  Understand these  instructions.  Will watch your condition.  Will get help right away if you are not doing well or get worse. Document Released: 11/01/2006 Document Revised: 09/15/2013 Document Reviewed: 02/25/2013 [facility] Patient Information 2015 [place], [place]. This information is not intended to replace advice given to you by your health care provider. Make sure you discuss any questions you have with your health care provider.

## 2015-04-01 NOTE — ED Notes (Signed)
Pt sts lower abd pain and nausea; pt sts LMP was in April and had positive pregnancy test yesterday; pt sts G7 P2 A2 M2

## 2015-04-01 NOTE — ED Provider Notes (Signed)
CSN: [id number]     Arrival date & time 04/01/15  1242 History   First MD Initiated Contact with Patient 04/01/15 1316     Chief Complaint  Patient presents with  . Abdominal Pain  . Possible Pregnancy  . Nausea   Paula Warner is a 29 y.o. female who is [id number] who presents to the ED complaining of intermittent nausea and abdominal pain for the past three days. Patient reports she's been feeling nauseated, worse in the mornings, over the past 3 days. She also complains of bilateral lower abdominal pain intermittently for the past 3 days. She reports she has not had a menstrual cycle since April and is not on birth control. She reports she is sexually active and does not use protection. She reports nausea but no vomiting. She reports that currently she has no abdominal pain, however she had some earlier today. She is unsure of her blood type, but reports she has never required rhogam before. The patient denies fevers, chills, urinary symptoms, hematuria, vaginal bleeding, vaginal discharge, diarrhea, constipation, vomiting or rashes.   (Consider location/radiation/quality/duration/timing/severity/associated sxs/prior Treatment) HPI  Past Medical History  Diagnosis Date  . Asthma    History reviewed. No pertinent past surgical history. History reviewed. No pertinent family history. History  Substance Use Topics  . Smoking status: Never Smoker   . Smokeless tobacco: Not on file  . Alcohol Use: No   OB History    No data available     Review of Systems  Constitutional: Negative for fever, chills and appetite change.  HENT: Negative for congestion and sore throat.   Eyes: Negative for visual disturbance.  Respiratory: Negative for cough, shortness of breath and wheezing.   Cardiovascular: Negative for chest pain and palpitations.  Gastrointestinal: Positive for nausea and abdominal pain. Negative for vomiting, diarrhea and blood in stool.  Genitourinary: Negative for dysuria,  urgency, frequency, hematuria, vaginal bleeding, vaginal discharge, difficulty urinating and pelvic pain.  Musculoskeletal: Negative for back pain and neck pain.  Skin: Negative for rash.  Neurological: Negative for dizziness, syncope, weakness, light-headedness and headaches.      Allergies  Review of patient's allergies indicates no known allergies.  Home Medications   Prior to Admission medications   Medication Sig Start Date End Date Taking? Authorizing Provider  ondansetron (ZOFRAN ODT) 4 MG disintegrating tablet 4mg  ODT q4 hours prn nausea/vomit Patient not taking: Reported on 04/01/2015 03/04/15   Comer Locket, PA-C  oxyCODONE-acetaminophen (PERCOCET) 5-325 MG per tablet Take 2 tablets by mouth every 4 (four) hours as needed. Patient not taking: Reported on 04/01/2015 03/04/15   Comer Locket, PA-C  tamsulosin (FLOMAX) 0.4 MG CAPS capsule Take 1 capsule (0.4 mg total) by mouth 2 (two) times daily. Patient not taking: Reported on 04/01/2015 03/04/15   Comer Locket, PA-C   BP 112/64 mmHg  Pulse 69  Temp(Src) 97.7 F (36.5 C) (Oral)  Resp 20  SpO2 100%  LMP 02/01/2015 Physical Exam  Constitutional: She is oriented to person, place, and time. She appears well-developed and well-nourished. No distress.  Nontoxic appearing.  HENT:  Head: Normocephalic and atraumatic.  Mouth/Throat: Oropharynx is clear and moist. No oropharyngeal exudate.  Eyes: Conjunctivae are normal. Pupils are equal, round, and reactive to light. Right eye exhibits no discharge. Left eye exhibits no discharge.  Neck: Neck supple.  Cardiovascular: Normal rate, regular rhythm, normal heart sounds and intact distal pulses.  Exam reveals no gallop and no friction rub.   No murmur heard. Pulmonary/Chest: Effort  normal and breath sounds normal. No respiratory distress. She has no wheezes. She has no rales.  Abdominal: Soft. Bowel sounds are normal. She exhibits no distension and no mass. There is no  tenderness. There is no rebound and no guarding.  Abdomen is soft and nontender to palpation. Bowel sounds are present. No peritoneal signs.  Genitourinary:  Pelvic exam performed by me with female nurse tech as chaperone. No external lesions are noted. No vaginal bleeding. Cervix is closed. No cervical motion tenderness. No adnexal tenderness or fullness. No uterine tenderness.   Musculoskeletal: She exhibits no edema.  Lymphadenopathy:    She has no cervical adenopathy.  Neurological: She is alert and oriented to person, place, and time. Coordination normal.  Skin: Skin is warm and dry. No rash noted. She is not diaphoretic. No erythema. No pallor.  Psychiatric: She has a normal mood and affect. Her behavior is normal.  Nursing note and vitals reviewed.   ED Course  Procedures (including critical care time) Labs Review Labs Reviewed  WET PREP, GENITAL - Abnormal; Notable for the following:    WBC, Wet Prep HPF POC FEW (*)    All other components within normal limits  URINALYSIS, ROUTINE W REFLEX MICROSCOPIC (NOT AT [facility]) - Abnormal; Notable for the following:    Color, Urine AMBER (*)    APPearance HAZY (*)    Ketones, ur >80 (*)    Leukocytes, UA SMALL (*)    All other components within normal limits  URINE MICROSCOPIC-ADD ON - Abnormal; Notable for the following:    Squamous Epithelial / LPF MANY (*)    Bacteria, UA MANY (*)    All other components within normal limits  POC URINE PREG, ED - Abnormal; Notable for the following:    Preg Test, Ur POSITIVE (*)    All other components within normal limits  I-STAT BETA HCG BLOOD, ED ([facility], [facility], [facility] ONLY) - Abnormal; Notable for the following:    I-stat hCG, quantitative >2000.0 (*)    All other components within normal limits  HIV ANTIBODY (ROUTINE TESTING)  RPR  HCG, QUANTITATIVE, PREGNANCY  GC/CHLAMYDIA PROBE AMP ([place]) NOT AT [facility]    Imaging Review US Ob Comp Less 14 Wks  04/01/2015   CLINICAL DATA:  29 year old  pregnant female with lower abdominal pain.  EXAM: OBSTETRIC <14 WK [place] AND TRANSVAGINAL OB US  TECHNIQUE: Both transabdominal and transvaginal ultrasound examinations were performed for complete evaluation of the gestation as well as the maternal uterus, adnexal regions, and pelvic cul-de-sac. Transvaginal technique was performed to assess early pregnancy.  COMPARISON:  None for this pregnancy  FINDINGS: Intrauterine gestational sac: Visualized/normal in shape.  Yolk sac:  Visualized  Embryo:  Not seen  Cardiac Activity: NA  Heart Rate: NA  Bpm  MSD: 11   mm   5 w   6  d  Maternal uterus/adnexae: The uterus and the ovaries appear unremarkable.  IMPRESSION: Single intrauterine gestational sac with an estimated gestational age of [redacted] weeks, 6 days. No fetal pole identified at this time. Follow-up recommended.   Electronically Signed   By: Anner Crete M.D.   On: 04/01/2015 15:09   US Ob Transvaginal  04/01/2015   CLINICAL DATA:  29 year old pregnant female with lower abdominal pain.  EXAM: OBSTETRIC <14 WK [place] AND TRANSVAGINAL OB US  TECHNIQUE: Both transabdominal and transvaginal ultrasound examinations were performed for complete evaluation of the gestation as well as the maternal uterus, adnexal regions, and pelvic cul-de-sac.  Transvaginal technique was performed to assess early pregnancy.  COMPARISON:  None for this pregnancy  FINDINGS: Intrauterine gestational sac: Visualized/normal in shape.  Yolk sac:  Visualized  Embryo:  Not seen  Cardiac Activity: NA  Heart Rate: NA  Bpm  MSD: 11   mm   5 w   6  d  Maternal uterus/adnexae: The uterus and the ovaries appear unremarkable.  IMPRESSION: Single intrauterine gestational sac with an estimated gestational age of [redacted] weeks, 6 days. No fetal pole identified at this time. Follow-up recommended.   Electronically Signed   By: Anner Crete M.D.   On: 04/01/2015 15:09     EKG Interpretation None      Filed Vitals:   04/01/15 1345 04/01/15 1400 04/01/15 1415  04/01/15 1605  BP: 107/68 116/70 122/68 112/64  Pulse: 65 90 78 69  Temp:      TempSrc:      Resp:    20  SpO2: 98% 100% 100% 100%     MDM   Final diagnoses:  Pregnancy  Nausea without vomiting   This  is a 29 y.o. female who is [id number] who presents to the ED complaining of intermittent nausea and abdominal pain for the past three days. Patient reports she's been feeling nauseated, worse in the mornings, over the past 3 days. She also complains of bilateral lower abdominal pain intermittently for the past 3 days. She reports she has not had a menstrual cycle since April and is not on birth control. She reports she is sexually active and does not use protection. She reports nausea but no vomiting. She denies current abdominal pain. On exam patient is afebrile and nontoxic appearing. Her abdomen is soft and nontender to palpation. Patient had a negative urine pregnancy test 4 weeks ago. Today she has a positive urine pregnancy test even i-STAT hCG greater than 2000. Quantitative hCG is pending. She is unsure of her blood type that she is never required rhogam.  in her previous pregnancies or abortions. Urinalysis shows small leukocytes with many squamous epithelial. She denies urinary symptoms. Wet prep is unremarkable. Pelvic exam [place] only for moderate amount of white vaginal discharge. Cervix is closed. No cervical motion tenderness. Patient has pending HIV, RPR, gonorrhea and chlamydia. Pelvic ultrasound indicated single intrauterine gestational sac with an estimated gestational age of [redacted] weeks and 6 days. No fetal pole is identified at this time. Will recommend follow-up for the patient with the women's outpatient clinic. The patient is in agreement and understanding with the reason for follow-up. Prior to discharge she denies nausea or abdominal pain. I discussed symptomatic treatment of her nausea during pregnancy. Education provided on first trimester pregnancy. I advised the patient to  follow-up with the women's outpatient clinic. Strict return precautions provided. I advised the patient to return to the emergency department with new or worsening symptoms or new concerns. The patient verbalized understanding and agreement with plan.  This patient was discussed with and evaluated by Dr. Maryan Rued who agrees with assessment and plan.       Paula Pean, PA-C 04/01/15 1627  Blanchie Dessert, MD 04/04/15 [phone]

## 2015-04-01 NOTE — ED Notes (Signed)
Patient and family are requesting orange juice.  RN made pt aware that she would not be able to eat or drink until she goes for ultrasound.  Pt verbalizes understanding.  Orange juice provided to children.

## 2015-04-02 LAB — RPR: RPR Ser Ql: NONREACTIVE

## 2015-04-02 LAB — HIV ANTIBODY (ROUTINE TESTING W REFLEX): HIV Screen 4th Generation wRfx: NONREACTIVE

## 2015-04-04 LAB — GC/CHLAMYDIA PROBE AMP (~~LOC~~) NOT AT ARMC
Chlamydia: NEGATIVE
Neisseria Gonorrhea: NEGATIVE

## 2015-04-23 ENCOUNTER — Emergency Department (HOSPITAL_COMMUNITY): Payer: Medicaid Other

## 2015-04-23 ENCOUNTER — Emergency Department (HOSPITAL_COMMUNITY)
Admission: EM | Admit: 2015-04-23 | Discharge: 2015-04-23 | Disposition: A | Discharge status: Home / Self Care | Payer: Medicaid Other | Attending: Emergency Medicine | Admitting: Emergency Medicine

## 2015-04-23 ENCOUNTER — Encounter (HOSPITAL_COMMUNITY): Payer: Self-pay

## 2015-04-23 DIAGNOSIS — O99511 Diseases of the respiratory system complicating pregnancy, first trimester: Secondary | ICD-10-CM | POA: Insufficient documentation

## 2015-04-23 DIAGNOSIS — O23511 Infections of cervix in pregnancy, first trimester: Secondary | ICD-10-CM | POA: Insufficient documentation

## 2015-04-23 DIAGNOSIS — Z3A01 Less than 8 weeks gestation of pregnancy: Secondary | ICD-10-CM | POA: Diagnosis not present

## 2015-04-23 DIAGNOSIS — J45909 Unspecified asthma, uncomplicated: Secondary | ICD-10-CM | POA: Diagnosis not present

## 2015-04-23 DIAGNOSIS — N72 Inflammatory disease of cervix uteri: Secondary | ICD-10-CM

## 2015-04-23 DIAGNOSIS — O021 Missed abortion: Secondary | ICD-10-CM

## 2015-04-23 DIAGNOSIS — Z3490 Encounter for supervision of normal pregnancy, unspecified, unspecified trimester: Secondary | ICD-10-CM

## 2015-04-23 LAB — HCG, QUANTITATIVE, PREGNANCY: hCG, Beta Chain, Quant, S: 525193 m[IU]/mL — ABNORMAL HIGH (ref <5)

## 2015-04-23 LAB — WET PREP, GENITAL
Trich, Wet Prep: NONE SEEN
YEAST WET PREP: NONE SEEN

## 2015-04-23 LAB — URINALYSIS, ROUTINE W REFLEX MICROSCOPIC
Glucose, UA: NEGATIVE mg/dL
Hgb urine dipstick: NEGATIVE
NITRITE: POSITIVE — AB
PH: 6 (ref 5.0–8.0)
PROTEIN: NEGATIVE mg/dL
Specific Gravity, Urine: 1.029 (ref 1.005–1.030)
Urobilinogen, UA: 1 mg/dL (ref 0.0–1.0)

## 2015-04-23 LAB — URINE MICROSCOPIC-ADD ON

## 2015-04-23 IMAGING — US US OB COMP LESS 14 WK
1 series · 3 of 3 positions shown · non-contrast
Comparison: Seventy-[REDACTED]

CLINICAL DATA: 29-year-old pregnant female with vaginal bleeding
and pelvic pain. Estimated gestational age of 9 weeks 0 days by
first ultrasound.

EXAM:
OBSTETRIC <14 WK ULTRASOUND
TECHNIQUE: Transabdominal ultrasound was performed for evaluation of the
gestation as well as the maternal uterus and adnexal regions.

[Series 1: us ob comp less 14 wk · 0.15mm/px · 3 of 3 slices shown]
[im 1/3]
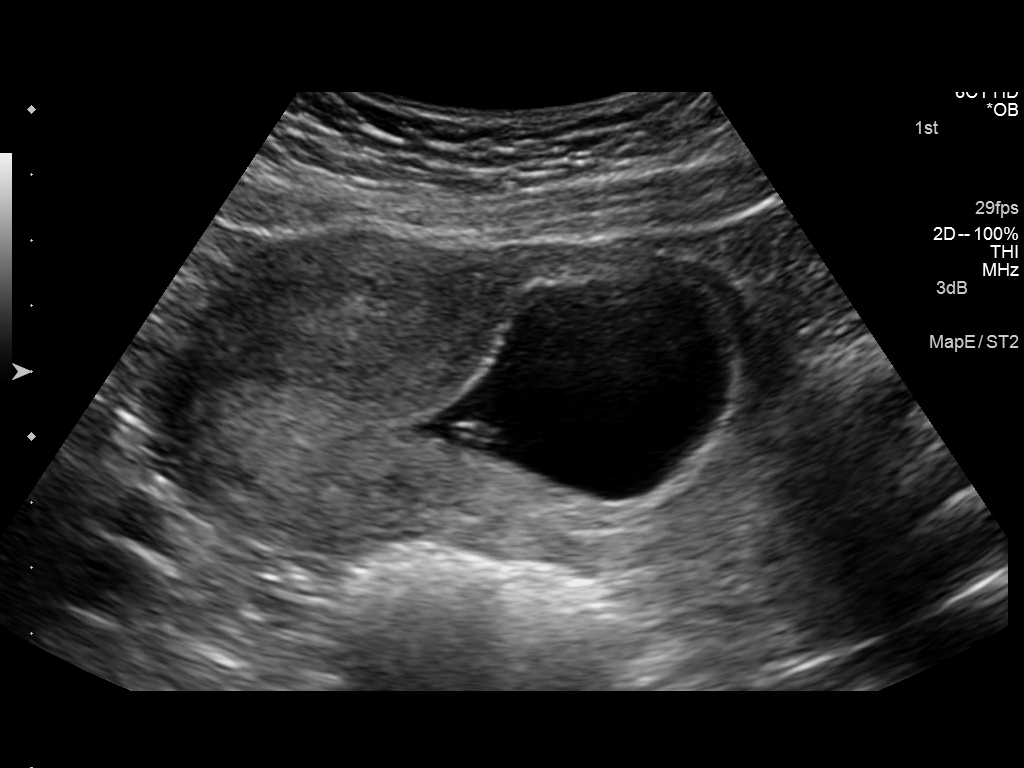
[im 2/3]
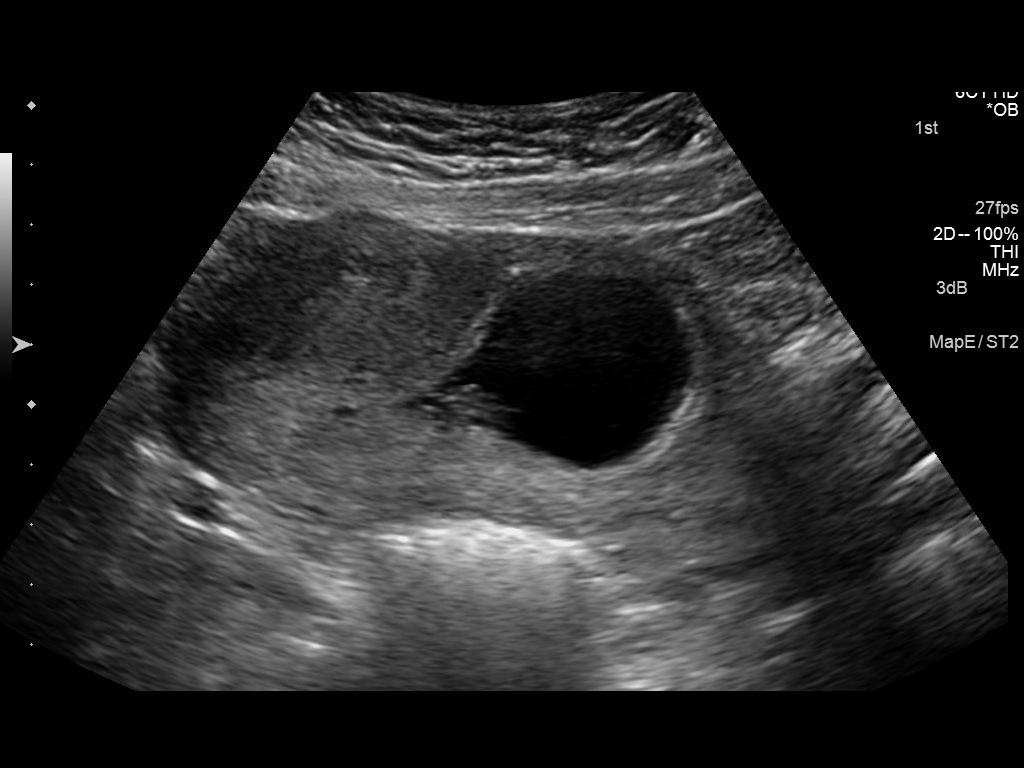
[im 3/3]
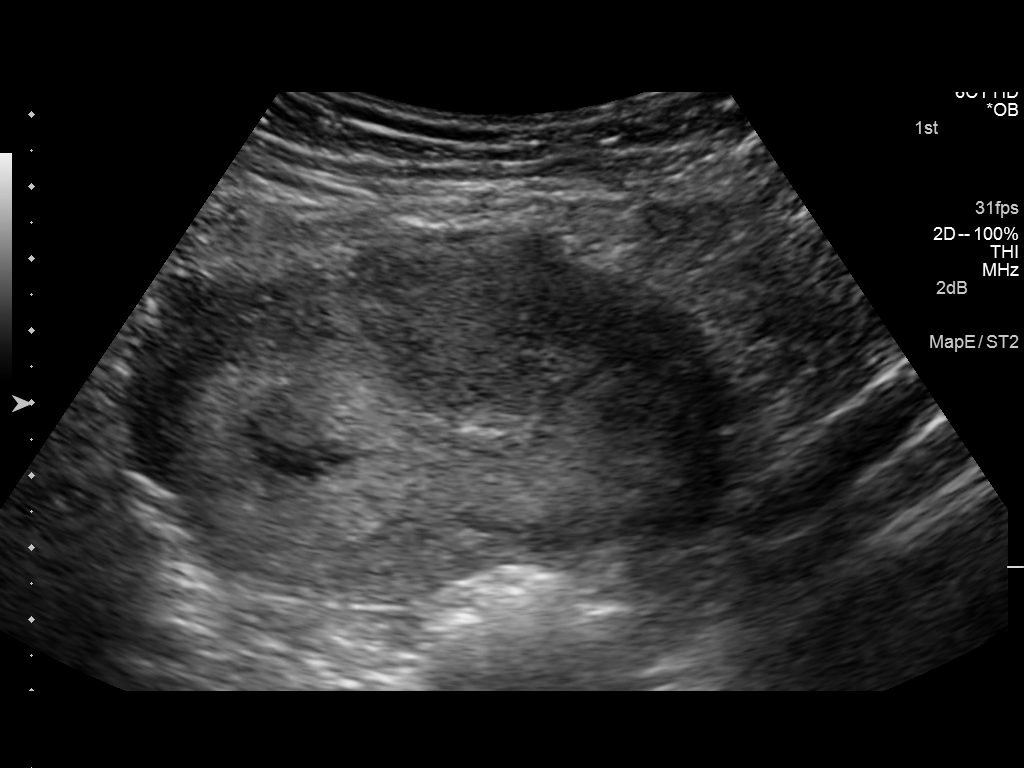

[3 of 3 positions shown; findings below may reference images not displayed]

FINDINGS: Intrauterine gestational sac: Visualized/normal in shape.

Yolk sac:  Present

Embryo:  Present

Cardiac Activity: Present

Heart Rate: 155 bpm

CRL:   22  mm   9 w 0 d                  US EDC: [DATE]

Maternal uterus/adnexae: There is no evidence of subchorionic
hemorrhage.

A 4.2 x 2.7 x 3.6 cm hypoechoic area within the anterior uterine
body does not change throughout the entire examination and most
compatible with intramural fibroid rather than a contraction.

Ovaries bilaterally are unremarkable.

No free fluid or adnexal mass identified.
IMPRESSION: Single living intrauterine gestation with assigned and estimated
gestational age of 9 weeks 0 days. No evidence of subchronic
hemorrhage.

4.2 x 2.7 x 3.8 cm probable anterior intramural intramural uterine
body fibroid. This could be further assessed on future scans.

## 2015-04-23 MED ORDER — LIDOCAINE HCL 2 % IJ SOLN
INTRAMUSCULAR | Status: AC
  Filled 2015-04-23: qty 20

## 2015-04-23 MED ORDER — PRENATAL COMPLETE 14-0.4 MG PO TABS: 1.0000 | ORAL_TABLET | Freq: Every day | ORAL | Status: DC

## 2015-04-23 MED ORDER — METRONIDAZOLE 500 MG PO TABS
500.0000 mg | ORAL_TABLET | Freq: Once | ORAL | Status: AC
  Administered 2015-04-23: 500 mg via ORAL
  Filled 2015-04-23: qty 1

## 2015-04-23 MED ORDER — AZITHROMYCIN 250 MG PO TABS
1000.0000 mg | ORAL_TABLET | Freq: Once | ORAL | Status: AC
  Administered 2015-04-23: 1000 mg via ORAL
  Filled 2015-04-23: qty 4

## 2015-04-23 MED ORDER — CEFTRIAXONE SODIUM 250 MG IJ SOLR
250.0000 mg | Freq: Once | INTRAMUSCULAR | Status: AC
  Administered 2015-04-23: 250 mg via INTRAMUSCULAR
  Filled 2015-04-23: qty 250

## 2015-04-23 MED ORDER — METRONIDAZOLE 500 MG PO TABS: 500.0000 mg | ORAL_TABLET | Freq: Two times a day (BID) | ORAL | Status: DC

## 2015-04-23 MED ORDER — AZITHROMYCIN 1 G PO PACK
1.0000 g | PACK | Freq: Once | ORAL | Status: DC
  Filled 2015-04-23: qty 1

## 2015-04-23 MED ORDER — METRONIDAZOLE 500 MG PO TABS: 2000.0000 mg | ORAL_TABLET | Freq: Once | ORAL | Status: DC

## 2015-04-23 MED ORDER — HYDROCODONE-ACETAMINOPHEN 5-325 MG PO TABS: 1.0000 | ORAL_TABLET | ORAL | Status: DC | PRN

## 2015-04-23 NOTE — ED Notes (Signed)
She c/o 4-day hx of "pinkish" vaginal d/c and 2-day hx of lower abd. Discomfort.  Seen at [facility] July 5, at which time u/s showed ~[redacted] week gestation and no fetal heartbeat detected.  She is remarkably calm and in no distress.

## 2015-04-23 NOTE — ED Notes (Signed)
Patient waiting on utra sound and pelvic exam.

## 2015-04-23 NOTE — ED Provider Notes (Signed)
CSN: [id number]     Arrival date & time 04/23/15  1044 History   First MD Initiated Contact with Patient 04/23/15 1048     Chief Complaint  Patient presents with  . Vaginal Discharge     (Consider location/radiation/quality/duration/timing/severity/associated sxs/prior Treatment) Patient is a 29 y.o. female presenting with female genitourinary complaint. The history is provided by the patient.  Female GU Problem This is a new problem. The current episode started more than 2 days ago. The problem occurs constantly. The problem has not changed since onset.Pertinent negatives include no abdominal pain. The symptoms are aggravated by intercourse. Nothing relieves the symptoms. She has tried nothing for the symptoms. The treatment provided no relief.    Past Medical History  Diagnosis Date  . Asthma    No past surgical history on file. No family history on file. History  Substance Use Topics  . Smoking status: Never Smoker   . Smokeless tobacco: Not on file  . Alcohol Use: No   OB History    Gravida Para Term Preterm AB TAB SAB Ectopic Multiple Living   1              Review of Systems  Gastrointestinal: Negative for abdominal pain.  Genitourinary: Positive for vaginal discharge (with foul odor, pink). Negative for vaginal bleeding.  All other systems reviewed and are negative.     Allergies  Review of patient's allergies indicates no known allergies.  Home Medications   Prior to Admission medications   Medication Sig Start Date End Date Taking? Authorizing Provider  metroNIDAZOLE (FLAGYL) 500 MG tablet Take 1 tablet (500 mg total) by mouth 2 (two) times daily. 04/23/15   Leo Grosser, MD  ondansetron (ZOFRAN ODT) 4 MG disintegrating tablet 4mg  ODT q4 hours prn nausea/vomit Patient not taking: Reported on 04/01/2015 03/04/15   Comer Locket, PA-C  oxyCODONE-acetaminophen (PERCOCET) 5-325 MG per tablet Take 2 tablets by mouth every 4 (four) hours as needed. Patient not  taking: Reported on 04/01/2015 03/04/15   Comer Locket, PA-C  Prenatal Vit-Fe Fumarate-FA (PRENATAL COMPLETE) 14-0.4 MG TABS Take 1 tablet by mouth daily. 04/23/15   Leo Grosser, MD  tamsulosin (FLOMAX) 0.4 MG CAPS capsule Take 1 capsule (0.4 mg total) by mouth 2 (two) times daily. Patient not taking: Reported on 04/01/2015 03/04/15   Comer Locket, PA-C   BP 124/73 mmHg  Pulse 93  Temp(Src) 98.5 F (36.9 C) (Oral)  Resp 16  SpO2 99%  LMP 02/01/2015 Physical Exam  Constitutional: She is oriented to person, place, and time. She appears well-developed and well-nourished. No distress.  HENT:  Head: Normocephalic.  Eyes: Conjunctivae are normal.  Neck: Neck supple. No tracheal deviation present.  Cardiovascular: Normal rate and regular rhythm.   Pulmonary/Chest: Effort normal. No respiratory distress.  Abdominal: Soft. She exhibits no distension.  Genitourinary: Uterus normal. Cervix exhibits motion tenderness. Right adnexum displays no tenderness. Left adnexum displays no tenderness. No bleeding in the vagina. Vaginal discharge (yellow mucopurulent) found.  Neurological: She is alert and oriented to person, place, and time.  Skin: Skin is warm and dry.  Psychiatric: She has a normal mood and affect.    ED Course  Procedures (including critical care time) Labs Review Labs Reviewed  WET PREP, GENITAL - Abnormal; Notable for the following:    Clue Cells Wet Prep HPF POC FEW (*)    WBC, Wet Prep HPF POC FEW (*)    All other components within normal limits  HCG, QUANTITATIVE, PREGNANCY -  Abnormal; Notable for the following:    hCG, Beta Neomia Dear [id number] (*)    All other components within normal limits  URINALYSIS, ROUTINE W REFLEX MICROSCOPIC (NOT AT [facility]) - Abnormal; Notable for the following:    Color, Urine AMBER (*)    APPearance CLOUDY (*)    Bilirubin Urine SMALL (*)    Ketones, ur >80 (*)    Nitrite POSITIVE (*)    Leukocytes, UA TRACE (*)    All other components  within normal limits  URINE MICROSCOPIC-ADD ON - Abnormal; Notable for the following:    Squamous Epithelial / LPF MANY (*)    Bacteria, UA MANY (*)    All other components within normal limits  URINE CULTURE  GC/CHLAMYDIA PROBE AMP ([place]) NOT AT [facility]    Imaging Review US Ob Comp Less 14 Wks  04/23/2015   CLINICAL DATA:  29 year old pregnant female with vaginal bleeding and pelvic pain. Estimated gestational age of [redacted] weeks 0 days by first ultrasound.  EXAM: OBSTETRIC <14 WK ULTRASOUND  TECHNIQUE: Transabdominal ultrasound was performed for evaluation of the gestation as well as the maternal uterus and adnexal regions.  COMPARISON:  Seventy-two thousand sixteen  FINDINGS: Intrauterine gestational sac: Visualized/normal in shape.  Yolk sac:  Present  Embryo:  Present  Cardiac Activity: Present  Heart Rate: 155 bpm  CRL:   22  mm   9 w 0 d                  [place] EDC: 11/26/2015  Maternal uterus/adnexae: There is no evidence of subchorionic hemorrhage.  A 4.2 x 2.7 x 3.6 cm hypoechoic area within the anterior uterine body does not change throughout the entire examination and most compatible with intramural fibroid rather than a contraction.  Ovaries bilaterally are unremarkable.  No free fluid or adnexal mass identified.  IMPRESSION: Single living intrauterine gestation with assigned and estimated gestational age of [redacted] weeks 0 days. No evidence of subchronic hemorrhage.  4.2 x 2.7 x 3.8 cm probable anterior intramural intramural uterine body fibroid. This could be further assessed on future scans.   Electronically Signed   By: Margarette Canada M.D.   On: 04/23/2015 14:13   I independently viewed and interpreted the above radiology studies and agree with radiologist report.    EKG Interpretation None      MDM   Final diagnoses:  Cervicitis  Pregnancy    29 year old female presents with unknown pregnancy status after having an intrauterine pregnancy identified without clear fetal heartbeat on  previous ED visit. Has developed vaginal d/c that is "pink" but no frank bleeding. Has lower abdominal discomfort. IUP confirmed with fetal heartbeat today. Exam c/w gonorrhea or other bacterial infection. Covered for STI and will treat empirically for BV. Culture sent for positive nitrite on UA but no urinary symptoms currently, more likely related to pelvic infection based on clinical appearance. Provided prenatal vitamin Rx and emphasized need to f/u closely with OB for rest of pregnancy as well as consider treatment of partners.    Leo Grosser, MD 04/23/15 [phone]

## 2015-04-23 NOTE — ED Notes (Signed)
Patient transported to Ultrasound 

## 2015-04-23 NOTE — ED Notes (Signed)
Verbal order given by dr. Laneta Simmers to collect urine sample and set patient up for pelvic exam.  Task completed.  Urine at bed side.  No room set for pelvic exam.  No other orders placed.

## 2015-04-23 NOTE — Discharge Instructions (Signed)
Cervicitis Cervicitis is a soreness and swelling (inflammation) of the cervix. Your cervix is located at the bottom of your uterus. It opens up to the vagina. CAUSES   Sexually transmitted infections (STIs).   Allergic reaction.   Medicines or birth control devices that are put in the vagina.   Injury to the cervix.   Bacterial infections.  RISK FACTORS You are at greater risk if you:  Have unprotected sexual intercourse.  Have sexual intercourse with many partners.  Began sexual intercourse at an early age.  Have a history of STIs. SYMPTOMS  There may be no symptoms. If symptoms occur, they may include:   Gray, white, yellow, or bad-smelling vaginal discharge.   Pain or itching of the area outside the vagina.   Painful sexual intercourse.   Lower abdominal or lower back pain, especially during intercourse.   Frequent urination.   Abnormal vaginal bleeding between periods, after sexual intercourse, or after menopause.   Pressure or a heavy feeling in the pelvis.  DIAGNOSIS  Diagnosis is made after a pelvic exam. Other tests may include:   Examination of any discharge under a microscope (wet prep).   A Pap test.  TREATMENT  Treatment will depend on the cause of cervicitis. If it is caused by an STI, both you and your partner will need to be treated. Antibiotic medicines will be given.  HOME CARE INSTRUCTIONS   Do not have sexual intercourse until your health care provider says it is okay.   Do not have sexual intercourse until your partner has been treated, if your cervicitis is caused by an STI.   Take your antibiotics as directed. Finish them even if you start to feel better.  SEEK MEDICAL CARE IF:  Your symptoms come back.   You have a fever.  MAKE SURE YOU:   Understand these instructions.  Will watch your condition.  Will get help right away if you are not doing well or get worse. Document Released: 09/10/2005 Document Revised:  09/15/2013 Document Reviewed: 03/04/2013 [facility] Patient Information 2015 [place], [place]. This information is not intended to replace advice given to you by your health care provider. Make sure you discuss any questions you have with your health care provider.  First Trimester of Pregnancy The first trimester of pregnancy is from week 1 until the end of week 12 (months 1 through 3). A week after a sperm fertilizes an egg, the egg will implant on the wall of the uterus. This embryo will begin to develop into a baby. Genes from you and your partner are forming the baby. The female genes determine whether the baby is a boy or a girl. At 6-8 weeks, the eyes and face are formed, and the heartbeat can be seen on ultrasound. At the end of 12 weeks, all the baby's organs are formed.  Now that you are pregnant, you will want to do everything you can to have a healthy baby. Two of the most important things are to get good prenatal care and to follow your health care provider's instructions. Prenatal care is all the medical care you receive before the baby's birth. This care will help prevent, find, and treat any problems during the pregnancy and childbirth. BODY CHANGES Your body goes through many changes during pregnancy. The changes vary from woman to woman.   You may gain or lose a couple of pounds at first.  You may feel sick to your stomach (nauseous) and throw up (vomit). If the vomiting is  uncontrollable, call your health care provider.  You may tire easily.  You may develop headaches that can be relieved by medicines approved by your health care provider.  You may urinate more often. Painful urination may mean you have a bladder infection.  You may develop heartburn as a result of your pregnancy.  You may develop constipation because certain hormones are causing the muscles that push waste through your intestines to slow down.  You may develop hemorrhoids or swollen, bulging veins (varicose  veins).  Your breasts may begin to grow larger and become tender. Your nipples may stick out more, and the tissue that surrounds them (areola) may become darker.  Your gums may bleed and may be sensitive to brushing and flossing.  Dark spots or blotches (chloasma, mask of pregnancy) may develop on your face. This will likely fade after the baby is born.  Your menstrual periods will stop.  You may have a loss of appetite.  You may develop cravings for certain kinds of food.  You may have changes in your emotions from day to day, such as being excited to be pregnant or being concerned that something may go wrong with the pregnancy and baby.  You may have more vivid and strange dreams.  You may have changes in your hair. These can include thickening of your hair, rapid growth, and changes in texture. Some women also have hair loss during or after pregnancy, or hair that feels dry or thin. Your hair will most likely return to normal after your baby is born. WHAT TO EXPECT AT YOUR PRENATAL VISITS During a routine prenatal visit:  You will be weighed to make sure you and the baby are growing normally.  Your blood pressure will be taken.  Your abdomen will be measured to track your baby's growth.  The fetal heartbeat will be listened to starting around week 10 or 12 of your pregnancy.  Test results from any previous visits will be discussed. Your health care provider may ask you:  How you are feeling.  If you are feeling the baby move.  If you have had any abnormal symptoms, such as leaking fluid, bleeding, severe headaches, or abdominal cramping.  If you have any questions. Other tests that may be performed during your first trimester include:  Blood tests to find your blood type and to check for the presence of any previous infections. They will also be used to check for low iron levels (anemia) and Rh antibodies. Later in the pregnancy, blood tests for diabetes will be done  along with other tests if problems develop.  Urine tests to check for infections, diabetes, or protein in the urine.  An ultrasound to confirm the proper growth and development of the baby.  An amniocentesis to check for possible genetic problems.  Fetal screens for spina bifida and Down syndrome.  You may need other tests to make sure you and the baby are doing well. HOME CARE INSTRUCTIONS  Medicines  Follow your health care provider's instructions regarding medicine use. Specific medicines may be either safe or unsafe to take during pregnancy.  Take your prenatal vitamins as directed.  If you develop constipation, try taking a stool softener if your health care provider approves. Diet  Eat regular, well-balanced meals. Choose a variety of foods, such as meat or vegetable-based protein, fish, milk and low-fat dairy products, vegetables, fruits, and whole grain breads and cereals. Your health care provider will help you determine the amount of weight gain that  is right for you.  Avoid raw meat and uncooked cheese. These carry germs that can cause birth defects in the baby.  Eating four or five small meals rather than three large meals a day may help relieve nausea and vomiting. If you start to feel nauseous, eating a few soda crackers can be helpful. Drinking liquids between meals instead of during meals also seems to help nausea and vomiting.  If you develop constipation, eat more high-fiber foods, such as fresh vegetables or fruit and whole grains. Drink enough fluids to keep your urine clear or pale yellow. Activity and Exercise  Exercise only as directed by your health care provider. Exercising will help you:  Control your weight.  Stay in shape.  Be prepared for labor and delivery.  Experiencing pain or cramping in the lower abdomen or low back is a good sign that you should stop exercising. Check with your health care provider before continuing normal exercises.  Try to  avoid standing for long periods of time. Move your legs often if you must stand in one place for a long time.  Avoid heavy lifting.  Wear low-heeled shoes, and practice good posture.  You may continue to have sex unless your health care provider directs you otherwise. Relief of Pain or Discomfort  Wear a good support bra for breast tenderness.   Take warm sitz baths to soothe any pain or discomfort caused by hemorrhoids. Use hemorrhoid cream if your health care provider approves.   Rest with your legs elevated if you have leg cramps or low back pain.  If you develop varicose veins in your legs, wear support hose. Elevate your feet for 15 minutes, 3-4 times a day. Limit salt in your diet. Prenatal Care  Schedule your prenatal visits by the twelfth week of pregnancy. They are usually scheduled monthly at first, then more often in the last 2 months before delivery.  Write down your questions. Take them to your prenatal visits.  Keep all your prenatal visits as directed by your health care provider. Safety  Wear your seat belt at all times when driving.  Make a list of emergency phone numbers, including numbers for family, friends, the hospital, and police and fire departments. General Tips  Ask your health care provider for a referral to a local prenatal education class. Begin classes no later than at the beginning of month 6 of your pregnancy.  Ask for help if you have counseling or nutritional needs during pregnancy. Your health care provider can offer advice or refer you to specialists for help with various needs.  Do not use hot tubs, steam rooms, or saunas.  Do not douche or use tampons or scented sanitary pads.  Do not cross your legs for long periods of time.  Avoid cat litter boxes and soil used by cats. These carry germs that can cause birth defects in the baby and possibly loss of the fetus by miscarriage or stillbirth.  Avoid all smoking, herbs, alcohol, and  medicines not prescribed by your health care provider. Chemicals in these affect the formation and growth of the baby.  Schedule a dentist appointment. At home, brush your teeth with a soft toothbrush and be gentle when you floss. SEEK MEDICAL CARE IF:   You have dizziness.  You have mild pelvic cramps, pelvic pressure, or nagging pain in the abdominal area.  You have persistent nausea, vomiting, or diarrhea.  You have a bad smelling vaginal discharge.  You have pain with urination.  You notice increased swelling in your face, hands, legs, or ankles. SEEK IMMEDIATE MEDICAL CARE IF:   You have a fever.  You are leaking fluid from your vagina.  You have spotting or bleeding from your vagina.  You have severe abdominal cramping or pain.  You have rapid weight gain or loss.  You vomit blood or material that looks like coffee grounds.  You are exposed to [place] measles and have never had them.  You are exposed to fifth disease or chickenpox.  You develop a severe headache.  You have shortness of breath.  You have any kind of trauma, such as from a fall or a car accident. Document Released: 09/04/2001 Document Revised: 01/25/2014 Document Reviewed: 07/21/2013 [facility] Patient Information 2015 [place], [place]. This information is not intended to replace advice given to you by your health care provider. Make sure you discuss any questions you have with your health care provider.

## 2015-04-25 LAB — URINE CULTURE

## 2015-04-25 LAB — GC/CHLAMYDIA PROBE AMP (~~LOC~~) NOT AT ARMC
Chlamydia: NEGATIVE
NEISSERIA GONORRHEA: NEGATIVE

## 2015-06-23 ENCOUNTER — Encounter: Payer: Self-pay | Admitting: Certified Nurse Midwife

## 2015-06-23 ENCOUNTER — Other Ambulatory Visit: Payer: Self-pay | Admitting: Certified Nurse Midwife

## 2015-06-23 ENCOUNTER — Ambulatory Visit (INDEPENDENT_AMBULATORY_CARE_PROVIDER_SITE_OTHER): Payer: Medicaid Other | Admitting: Certified Nurse Midwife

## 2015-06-23 VITALS — BP 96/63 | HR 61 | Temp 98.0°F | Ht 61.0 in | Wt 117.0 lb

## 2015-06-23 DIAGNOSIS — Z309 Encounter for contraceptive management, unspecified: Secondary | ICD-10-CM

## 2015-06-23 DIAGNOSIS — Z8742 Personal history of other diseases of the female genital tract: Secondary | ICD-10-CM | POA: Diagnosis not present

## 2015-06-23 DIAGNOSIS — Z3202 Encounter for pregnancy test, result negative: Secondary | ICD-10-CM | POA: Diagnosis not present

## 2015-06-23 DIAGNOSIS — N939 Abnormal uterine and vaginal bleeding, unspecified: Secondary | ICD-10-CM | POA: Diagnosis not present

## 2015-06-23 DIAGNOSIS — Z3009 Encounter for other general counseling and advice on contraception: Secondary | ICD-10-CM

## 2015-06-23 DIAGNOSIS — N926 Irregular menstruation, unspecified: Secondary | ICD-10-CM

## 2015-06-23 LAB — CBC WITH DIFFERENTIAL/PLATELET
Basophils Absolute: 0 10*3/uL (ref 0.0–0.1)
Basophils Relative: 0 % (ref 0–1)
EOS ABS: 0.1 10*3/uL (ref 0.0–0.7)
Eosinophils Relative: 1 % (ref 0–5)
HCT: 41.7 % (ref 36.0–46.0)
HEMOGLOBIN: 13.9 g/dL (ref 12.0–15.0)
Lymphocytes Relative: 25 % (ref 12–46)
Lymphs Abs: 2.4 10*3/uL (ref 0.7–4.0)
MCH: 31.7 pg (ref 26.0–34.0)
MCHC: 33.3 g/dL (ref 30.0–36.0)
MCV: 95.2 fL (ref 78.0–100.0)
MONO ABS: 0.9 10*3/uL (ref 0.1–1.0)
MONOS PCT: 9 % (ref 3–12)
MPV: 10.7 fL (ref 8.6–12.4)
Neutro Abs: 6.3 10*3/uL (ref 1.7–7.7)
Neutrophils Relative %: 65 % (ref 43–77)
PLATELETS: 243 10*3/uL (ref 150–400)
RBC: 4.38 MIL/uL (ref 3.87–5.11)
RDW: 13 % (ref 11.5–15.5)
WBC: 9.7 10*3/uL (ref 4.0–10.5)

## 2015-06-23 LAB — COMPREHENSIVE METABOLIC PANEL
ALBUMIN: 4.4 g/dL (ref 3.6–5.1)
ALT: 22 U/L (ref 6–29)
AST: 18 U/L (ref 10–30)
Alkaline Phosphatase: 47 U/L (ref 33–115)
BUN: 10 mg/dL (ref 7–25)
CHLORIDE: 103 mmol/L (ref 98–110)
CO2: 29 mmol/L (ref 20–31)
Calcium: 9.6 mg/dL (ref 8.6–10.2)
Creat: 0.53 mg/dL (ref 0.50–1.10)
Glucose, Bld: 75 mg/dL (ref 65–99)
POTASSIUM: 3.7 mmol/L (ref 3.5–5.3)
Sodium: 144 mmol/L (ref 135–146)
Total Bilirubin: 2.1 mg/dL — ABNORMAL HIGH (ref 0.2–1.2)
Total Protein: 7.3 g/dL (ref 6.1–8.1)

## 2015-06-23 LAB — POCT URINE PREGNANCY: Preg Test, Ur: NEGATIVE

## 2015-06-23 NOTE — Progress Notes (Signed)
Patient ID: Paula Warner, female   DOB: May 06, 1986, 29 y.o.   MRN: [id number]  Subjective:    Paula Warner is a 29 y.o. female who presents for contraception counseling. The patient has no complaints today. The patient is sexually active. Pertinent past medical history: none.  TAB on August 5th, on birth control for one month, stopped taking the pills the second week of September.  Has not had a period in September.  No condom use.  Is having regular sexual intercourse.  Was on Depo in [place].  Had not had period for over a year.  Then had gotten pills to help her have a period and got pregnant in May.  August period was from the abortion.    The information documented in the HPI was reviewed and verified.  Menstrual History: OB History    Gravida Para Term Preterm AB TAB SAB Ectopic Multiple Living   4 1   1 1    0 2     G6, P2, SAB 2, TAB 2,  L2 Menarche age: 29  Patient's last menstrual period was 02/01/2015 (exact date).   There are no active problems to display for this patient.  Past Medical History  Diagnosis Date  . Asthma     Past Surgical History  Procedure Laterality Date  . Induced abortion    . Cesarean section       Current outpatient prescriptions:  .  metroNIDAZOLE (FLAGYL) 500 MG tablet, Take 1 tablet (500 mg total) by mouth 2 (two) times daily. (Patient not taking: Reported on 06/23/2015), Disp: 14 tablet, Rfl: 0 .  ondansetron (ZOFRAN ODT) 4 MG disintegrating tablet, 4mg  ODT q4 hours prn nausea/vomit (Patient not taking: Reported on 04/01/2015), Disp: 20 tablet, Rfl: 0 .  oxyCODONE-acetaminophen (PERCOCET) 5-325 MG per tablet, Take 2 tablets by mouth every 4 (four) hours as needed. (Patient not taking: Reported on 04/01/2015), Disp: 10 tablet, Rfl: 0 .  Prenatal Vit-Fe Fumarate-FA (PRENATAL COMPLETE) 14-0.4 MG TABS, Take 1 tablet by mouth daily. (Patient not taking: Reported on 06/23/2015), Disp: 60 each, Rfl: 0 .  tamsulosin (FLOMAX) 0.4 MG CAPS capsule, Take 1 capsule  (0.4 mg total) by mouth 2 (two) times daily. (Patient not taking: Reported on 04/01/2015), Disp: 10 capsule, Rfl: 0 No Known Allergies  Social History  Substance Use Topics  . Smoking status: Never Smoker   . Smokeless tobacco: Not on file  . Alcohol Use: No    No family history on file.     Review of Systems Constitutional: negative for weight loss Genitourinary:negative for abnormal menstrual periods and vaginal discharge   Objective:   BP 96/63 mmHg  Pulse 61  Temp(Src) 98 F (36.7 C)  Ht 5\' 1"  (1.549 m)  Wt 117 lb (53.071 kg)  BMI 22.12 kg/m2  LMP 02/01/2015 (Exact Date)  Breastfeeding? Unknown   General:   alert  Skin:   no rash or abnormalities  Lungs:   clear to auscultation bilaterally  Heart:   regular rate and rhythm, S1, S2 normal, no murmur, click, rub or gallop  Breasts:   normal without suspicious masses, skin or nipple changes or axillary nodes  Abdomen:  normal findings: no organomegaly, soft, non-tender and no hernia  Pelvis: deferred   Lab Review Urine pregnancy test Labs reviewed yes Radiologic studies reviewed no  50% of 30 min visit spent on counseling and coordination of care.   Assessment:    29 y.o., starting OCP (estrogen/progesterone), no contraindications.   Pending negative  serum HCG. Plan:    All questions answered. Pregnancy test, result: pending.  No orders of the defined types were placed in this encounter.   Orders Placed This Encounter  Procedures  . POCT urine pregnancy   Need to obtain previous records Follow up in 2 weeks

## 2015-06-24 LAB — HCG, SERUM, QUALITATIVE: PREG SERUM: NEGATIVE

## 2015-06-24 LAB — TSH: TSH: 0.143 u[IU]/mL — ABNORMAL LOW (ref 0.350–4.500)

## 2015-06-24 LAB — PROGESTERONE: Progesterone: 0.7 ng/mL

## 2015-06-24 LAB — TESTOSTERONE, FREE, TOTAL, SHBG
SEX HORMONE BINDING: 61 nmol/L (ref 17–124)
TESTOSTERONE FREE: 8.2 pg/mL — AB (ref 0.6–6.8)
Testosterone-% Free: 1.2 % (ref 0.4–2.4)
Testosterone: 68 ng/dL (ref 10–70)

## 2015-06-24 LAB — PROLACTIN: PROLACTIN: 3.5 ng/mL

## 2015-06-27 LAB — 17-HYDROXYPROGESTERONE: 17-OH-Progesterone, LC/MS/MS: 37 ng/dL

## 2015-06-30 LAB — THYROID PROFILE - CHCC
FREE THYROXINE INDEX: 2.8 (ref 1.4–3.8)
T3 UPTAKE: 30 % (ref 22–35)
T4, Total: 9.4 ug/dL (ref 4.5–12.0)

## 2015-07-07 ENCOUNTER — Ambulatory Visit: Payer: Medicaid Other | Admitting: Certified Nurse Midwife

## 2015-07-07 ENCOUNTER — Other Ambulatory Visit: Payer: Medicaid Other

## 2015-07-12 ENCOUNTER — Telehealth: Payer: Self-pay | Admitting: Certified Nurse Midwife

## 2015-07-12 ENCOUNTER — Ambulatory Visit: Payer: Medicaid Other | Admitting: Certified Nurse Midwife

## 2015-07-15 NOTE — Telephone Encounter (Signed)
NO RETURN CALLS FROM PATIENT

## 2015-07-21 ENCOUNTER — Other Ambulatory Visit: Payer: Medicaid Other

## 2015-07-29 ENCOUNTER — Ambulatory Visit: Payer: Medicaid Other | Admitting: Certified Nurse Midwife

## 2015-08-04 ENCOUNTER — Encounter: Payer: Self-pay | Admitting: Certified Nurse Midwife

## 2015-08-04 ENCOUNTER — Ambulatory Visit (INDEPENDENT_AMBULATORY_CARE_PROVIDER_SITE_OTHER): Payer: Medicaid Other | Admitting: Certified Nurse Midwife

## 2015-08-04 VITALS — BP 109/72 | HR 67 | Wt 110.0 lb

## 2015-08-04 DIAGNOSIS — Z3009 Encounter for other general counseling and advice on contraception: Secondary | ICD-10-CM | POA: Diagnosis not present

## 2015-08-04 DIAGNOSIS — Z3200 Encounter for pregnancy test, result unknown: Secondary | ICD-10-CM

## 2015-08-04 DIAGNOSIS — Z113 Encounter for screening for infections with a predominantly sexual mode of transmission: Secondary | ICD-10-CM

## 2015-08-04 DIAGNOSIS — N760 Acute vaginitis: Secondary | ICD-10-CM | POA: Diagnosis not present

## 2015-08-04 DIAGNOSIS — N9489 Other specified conditions associated with female genital organs and menstrual cycle: Secondary | ICD-10-CM | POA: Diagnosis not present

## 2015-08-04 DIAGNOSIS — A499 Bacterial infection, unspecified: Secondary | ICD-10-CM | POA: Diagnosis not present

## 2015-08-04 DIAGNOSIS — Z3202 Encounter for pregnancy test, result negative: Secondary | ICD-10-CM

## 2015-08-04 DIAGNOSIS — N898 Other specified noninflammatory disorders of vagina: Secondary | ICD-10-CM

## 2015-08-04 DIAGNOSIS — B9689 Other specified bacterial agents as the cause of diseases classified elsewhere: Secondary | ICD-10-CM

## 2015-08-04 DIAGNOSIS — Z7251 High risk heterosexual behavior: Secondary | ICD-10-CM | POA: Diagnosis not present

## 2015-08-04 MED ORDER — TERCONAZOLE 0.4 % VA CREA: 1.0000 | TOPICAL_CREAM | Freq: Every day | VAGINAL | Status: DC

## 2015-08-04 MED ORDER — METRONIDAZOLE 500 MG PO TABS: 500.0000 mg | ORAL_TABLET | Freq: Two times a day (BID) | ORAL | Status: DC

## 2015-08-04 MED ORDER — LEVONORGEST-ETH ESTRAD 91-DAY 0.15-0.03 &0.01 MG PO TABS: 1.0000 | ORAL_TABLET | Freq: Every day | ORAL | Status: DC

## 2015-08-04 MED ORDER — FLUCONAZOLE 100 MG PO TABS: 100.0000 mg | ORAL_TABLET | Freq: Once | ORAL | Status: DC

## 2015-08-04 NOTE — Progress Notes (Signed)
Patient ID: Paula Warner, female   DOB: 05-05-1986, 29 y.o.   MRN: [id number]   Chief Complaint  Patient presents with  . Follow-up    HPI Paula Warner is a 29 y.o. female.  Is here for vaginal odor that has been occuring for several days and is increased in odor with sexual intercourse.  Is not on birth control.  Reports using condoms occasionally.  Does not desire pregnancy at this time.  Last sexual encounter was one week ago.  Desires to take birth control pills. Desires to have a period about every three months.  Periods are usually five days with heavy bleeding and cramping.    HPI  Past Medical History  Diagnosis Date  . Asthma     Past Surgical History  Procedure Laterality Date  . Induced abortion    . Cesarean section      No family history on file.  Social History Social History  Substance Use Topics  . Smoking status: Never Smoker   . Smokeless tobacco: None  . Alcohol Use: No    No Known Allergies  Current Outpatient Prescriptions  Medication Sig Dispense Refill  . fluconazole (DIFLUCAN) 100 MG tablet Take 1 tablet (100 mg total) by mouth once. Repeat dose in 48-72 hour. 3 tablet 0  . Levonorgestrel-Ethinyl Estradiol (SEASONIQUE) 0.15-0.03 &0.01 MG tablet Take 1 tablet by mouth daily. 1 Package 4  . metroNIDAZOLE (FLAGYL) 500 MG tablet Take 1 tablet (500 mg total) by mouth 2 (two) times daily. 14 tablet 0  . ondansetron (ZOFRAN ODT) 4 MG disintegrating tablet 4mg  ODT q4 hours prn nausea/vomit (Patient not taking: Reported on 04/01/2015) 20 tablet 0  . oxyCODONE-acetaminophen (PERCOCET) 5-325 MG per tablet Take 2 tablets by mouth every 4 (four) hours as needed. (Patient not taking: Reported on 04/01/2015) 10 tablet 0  . Prenatal Vit-Fe Fumarate-FA (PRENATAL COMPLETE) 14-0.4 MG TABS Take 1 tablet by mouth daily. (Patient not taking: Reported on 06/23/2015) 60 each 0  . tamsulosin (FLOMAX) 0.4 MG CAPS capsule Take 1 capsule (0.4 mg total) by mouth 2 (two) times  daily. (Patient not taking: Reported on 04/01/2015) 10 capsule 0  . terconazole (TERAZOL 7) 0.4 % vaginal cream Place 1 applicator vaginally at bedtime. 45 g 0   No current facility-administered medications for this visit.    Review of Systems Review of Systems Constitutional: negative for fatigue and weight loss Respiratory: negative for cough and wheezing Cardiovascular: negative for chest pain, fatigue and palpitations Gastrointestinal: negative for abdominal pain and change in bowel habits Genitourinary: + vaginal odor Integument/breast: negative for nipple discharge Musculoskeletal:negative for myalgias Neurological: negative for gait problems and tremors Behavioral/Psych: negative for abusive relationship, depression Endocrine: negative for temperature intolerance     Blood pressure 109/72, pulse 67, weight 110 lb (49.896 kg), last menstrual period 07/08/2015, unknown if currently breastfeeding.  Physical Exam Physical Exam General:   alert  Skin:   no rash or abnormalities  Lungs:   clear to auscultation bilaterally  Heart:   regular rate and rhythm, S1, S2 normal, no murmur, click, rub or gallop  Breasts:   deferred  Abdomen:  normal findings: no organomegaly, soft, non-tender and no hernia  Pelvis:  External genitalia: normal general appearance Urinary system: urethral meatus normal and bladder without fullness, nontender Vaginal: normal without tenderness, induration or masses Cervix: normal appearance Adnexa: normal bimanual exam Uterus: anteverted and non-tender, normal size    50% of 15 min visit spent on counseling and coordination of care.  Data Reviewed Previous medical hx, labs, meds  Assessment     BV Vaginitis High risk sexual behaviors STD screening exam     Plan    Orders Placed This Encounter  Procedures  . SureSwab, Vaginosis/Vaginitis Plus  . POCT urine pregnancy   Meds ordered this encounter  Medications  . Levonorgestrel-Ethinyl  Estradiol (SEASONIQUE) 0.15-0.03 &0.01 MG tablet    Sig: Take 1 tablet by mouth daily.    Dispense:  1 Package    Refill:  4  . metroNIDAZOLE (FLAGYL) 500 MG tablet    Sig: Take 1 tablet (500 mg total) by mouth 2 (two) times daily.    Dispense:  14 tablet    Refill:  0  . fluconazole (DIFLUCAN) 100 MG tablet    Sig: Take 1 tablet (100 mg total) by mouth once. Repeat dose in 48-72 hour.    Dispense:  3 tablet    Refill:  0  . terconazole (TERAZOL 7) 0.4 % vaginal cream    Sig: Place 1 applicator vaginally at bedtime.    Dispense:  45 g    Refill:  0    Possible management options include: another form of contraception Follow up in 3 months.

## 2015-08-08 LAB — SURESWAB, VAGINOSIS/VAGINITIS PLUS
ATOPOBIUM VAGINAE: 7.3 Log (cells/mL)
C. TRACHOMATIS RNA, TMA: NOT DETECTED
C. albicans, DNA: NOT DETECTED
C. glabrata, DNA: NOT DETECTED
C. parapsilosis, DNA: NOT DETECTED
C. tropicalis, DNA: NOT DETECTED
Gardnerella vaginalis: >8 Log (cells/mL)
LACTOBACILLUS SPECIES: NOT DETECTED Log (cells/mL)
N. GONORRHOEAE RNA, TMA: NOT DETECTED
T. vaginalis RNA, QL TMA: NOT DETECTED

## 2015-08-10 ENCOUNTER — Other Ambulatory Visit: Payer: Self-pay | Admitting: Certified Nurse Midwife

## 2015-08-28 IMAGING — US US PELVIS COMPLETE
1 series · 13 of 25 positions shown · non-contrast
Comparison: None.

CLINICAL DATA: Left lower quadrant pain.  Pain for 3 days.

EXAM:
TRANSABDOMINAL AND TRANSVAGINAL ULTRASOUND OF PELVIS
DOPPLER ULTRASOUND OF OVARIES
TECHNIQUE: Both transabdominal and transvaginal ultrasound examinations of the
pelvis were performed. Transabdominal technique was performed for
global imaging of the pelvis including uterus, ovaries, adnexal
regions, and pelvic cul-de-sac.
It was necessary to proceed with endovaginal exam following the
transabdominal exam to visualize the endometrium and ovaries. Color
and duplex Doppler ultrasound was utilized to evaluate blood flow to
the ovaries.

[Series 1: us pelvis complete · 0.18mm/px · 13 of 46 slices shown]
[im 1/46]
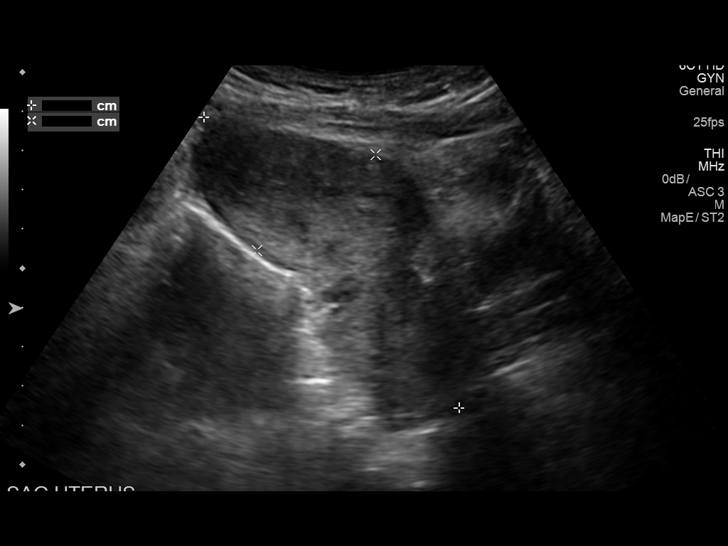
[im 4/46]
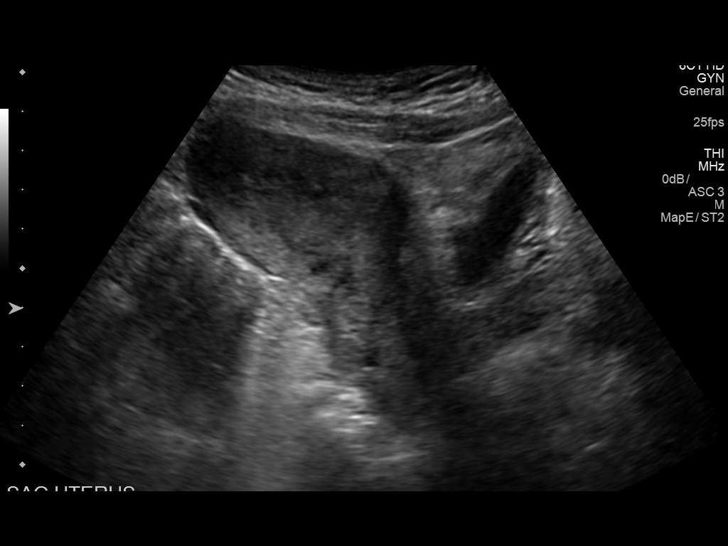
[im 8/46]
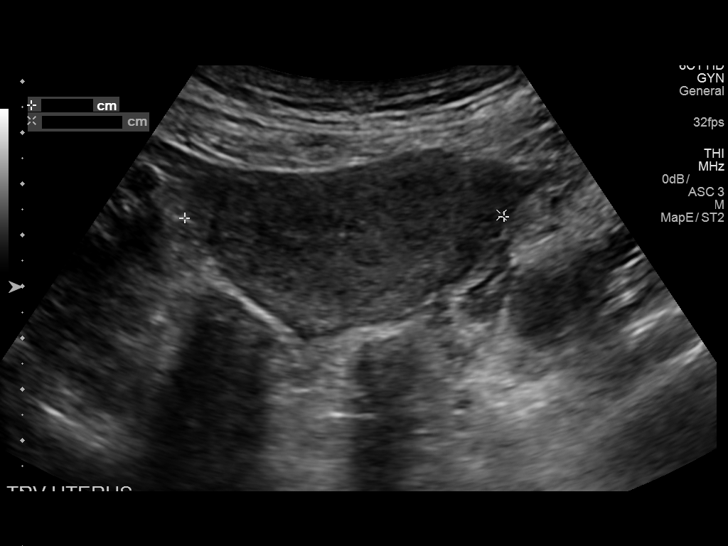
[im 12/46]
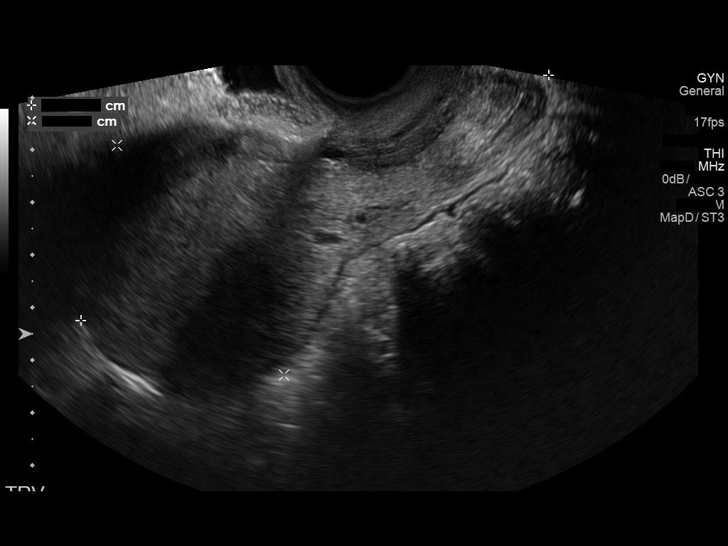
[im 16/46]
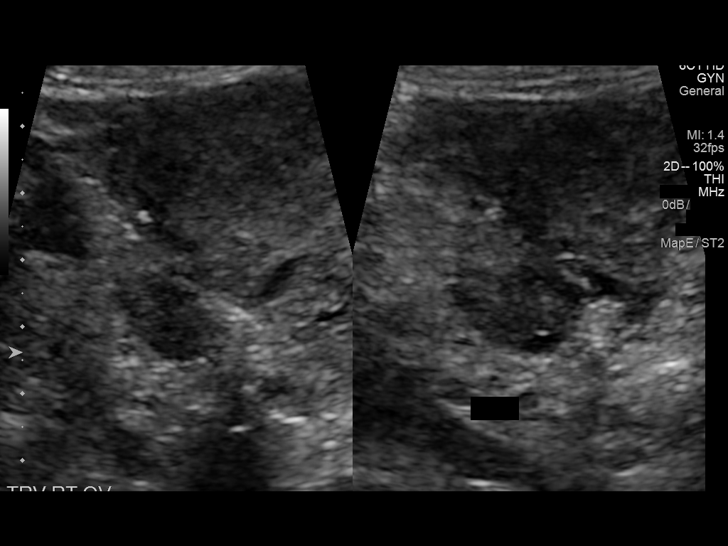
[im 19/46]
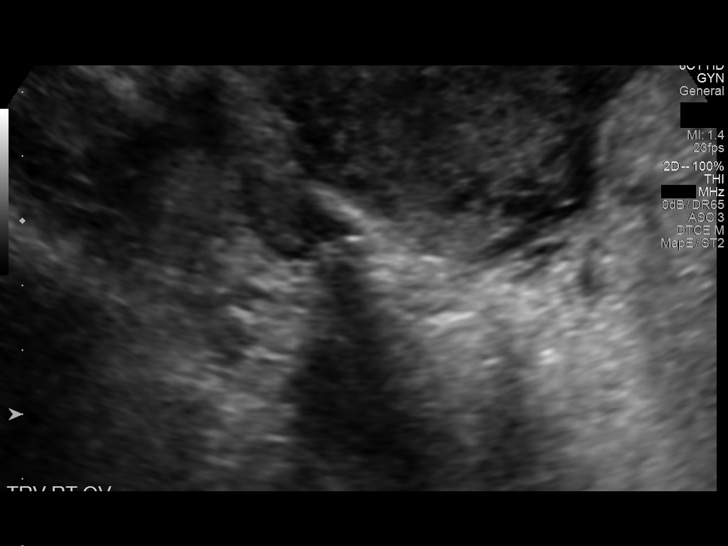
[im 23/46]
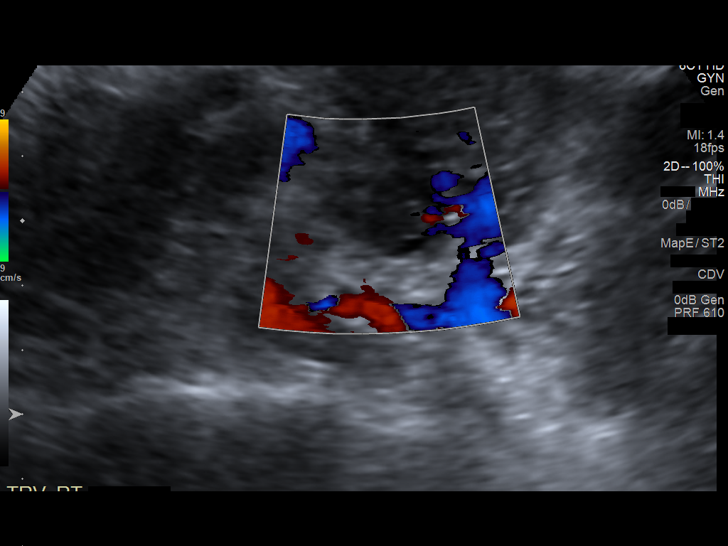
[im 27/46]
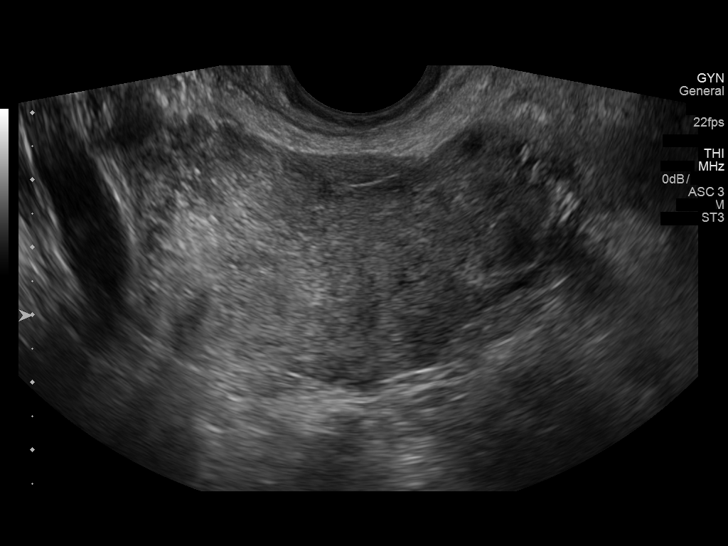
[im 31/46]
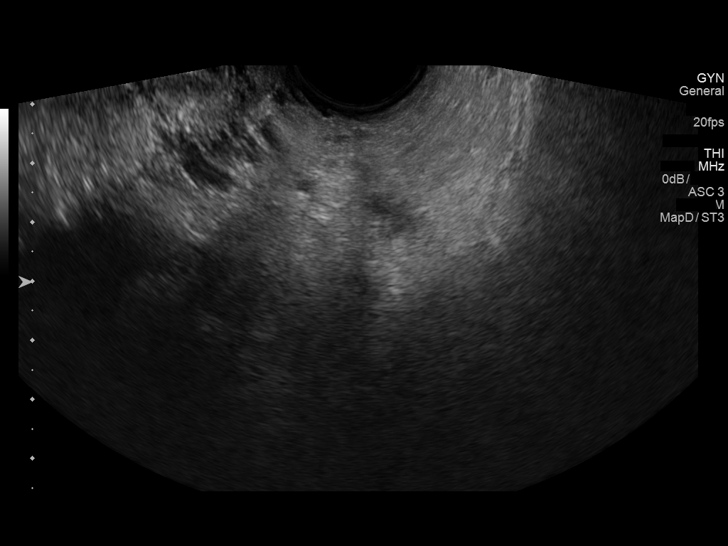
[im 34/46]
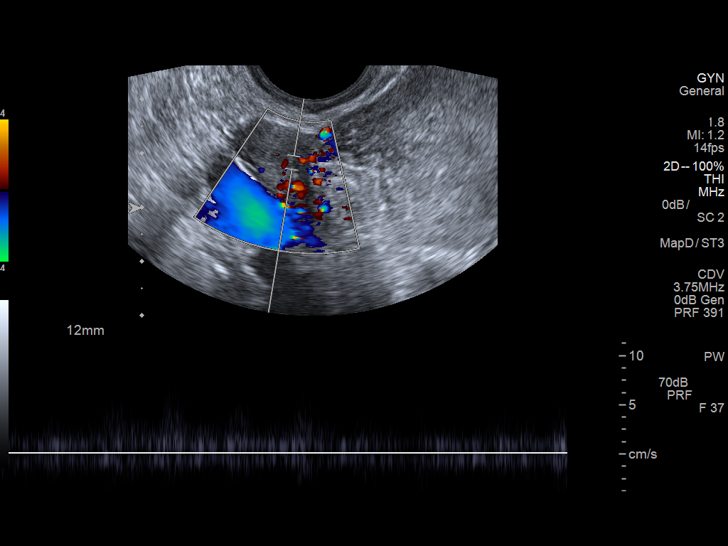
[im 38/46]
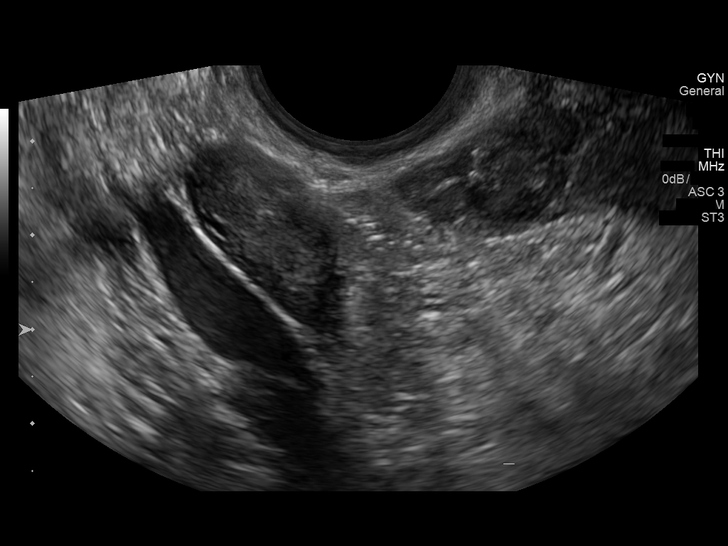
[im 42/46]
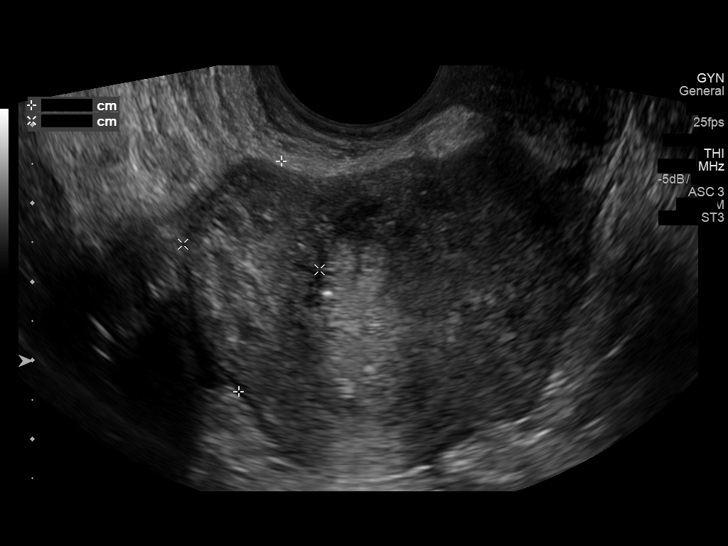
[im 46/46]
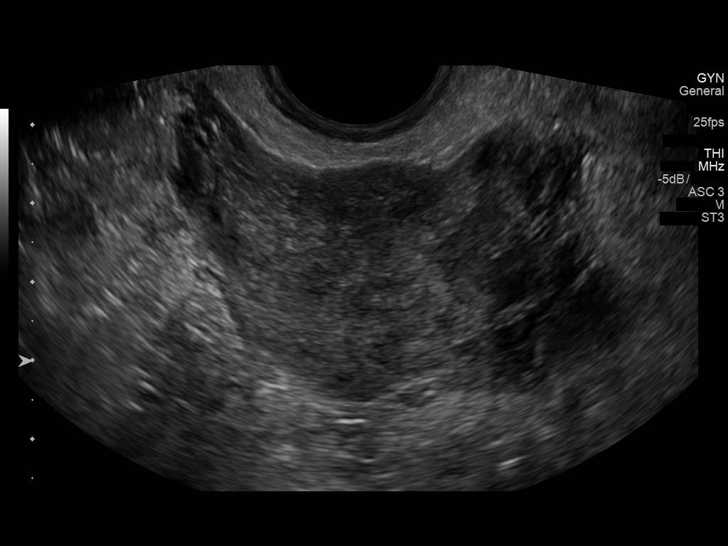

[13 of 25 positions shown; findings below may reference images not displayed]

FINDINGS: Uterus

Measurements: 10.1 x 5.4 x 6 cm. Possible 3 x 1.8 x 1.6 cm
hypoechoic right fundal uterine mass likely representing a sub
serosal fibroid.

Endometrium

Thickness: 6.5 mm.  No focal abnormality visualized.

Right ovary

Measurements: 1.6 x 1.2 x 1.8 cm. Normal appearance/no adnexal mass.

Left ovary

Not visualize

Pulsed Doppler evaluation of the right ovary demonstrates normal
low-resistance arterial and venous waveforms.

Other findings

No abnormal free fluid.
IMPRESSION: 1. Nonvisualized left ovary.
2. Normal right ovary.  No right ovarian torsion.

## 2015-09-25 HISTORY — PX: STENT PLACEMENT VASCULAR (ARMC HX): HXRAD1737

## 2015-10-16 ENCOUNTER — Encounter (HOSPITAL_COMMUNITY): Payer: Self-pay | Admitting: *Deleted

## 2015-10-16 ENCOUNTER — Emergency Department (HOSPITAL_COMMUNITY)
Admission: EM | Admit: 2015-10-16 | Discharge: 2015-10-16 | Disposition: A | Discharge status: Home / Self Care | Payer: Medicaid Other | Attending: Emergency Medicine | Admitting: Emergency Medicine

## 2015-10-16 DIAGNOSIS — Z793 Long term (current) use of hormonal contraceptives: Secondary | ICD-10-CM | POA: Insufficient documentation

## 2015-10-16 DIAGNOSIS — Z79899 Other long term (current) drug therapy: Secondary | ICD-10-CM | POA: Insufficient documentation

## 2015-10-16 DIAGNOSIS — Z3202 Encounter for pregnancy test, result negative: Secondary | ICD-10-CM | POA: Insufficient documentation

## 2015-10-16 DIAGNOSIS — A64 Unspecified sexually transmitted disease: Secondary | ICD-10-CM | POA: Insufficient documentation

## 2015-10-16 DIAGNOSIS — N898 Other specified noninflammatory disorders of vagina: Secondary | ICD-10-CM

## 2015-10-16 DIAGNOSIS — J45909 Unspecified asthma, uncomplicated: Secondary | ICD-10-CM | POA: Insufficient documentation

## 2015-10-16 DIAGNOSIS — Z792 Long term (current) use of antibiotics: Secondary | ICD-10-CM | POA: Insufficient documentation

## 2015-10-16 LAB — POC URINE PREG, ED: PREG TEST UR: NEGATIVE

## 2015-10-16 MED ORDER — AZITHROMYCIN 250 MG PO TABS
1000.0000 mg | ORAL_TABLET | Freq: Once | ORAL | Status: AC
  Administered 2015-10-16: 1000 mg via ORAL
  Filled 2015-10-16: qty 4

## 2015-10-16 MED ORDER — METRONIDAZOLE 500 MG PO TABS
2000.0000 mg | ORAL_TABLET | Freq: Once | ORAL | Status: AC
  Administered 2015-10-16: 2000 mg via ORAL
  Filled 2015-10-16: qty 4

## 2015-10-16 MED ORDER — ONDANSETRON 8 MG PO TBDP
8.0000 mg | ORAL_TABLET | Freq: Once | ORAL | Status: AC
  Administered 2015-10-16: 8 mg via ORAL
  Filled 2015-10-16: qty 1

## 2015-10-16 MED ORDER — FLUCONAZOLE 150 MG PO TABS
150.0000 mg | ORAL_TABLET | Freq: Once | ORAL | Status: AC
  Administered 2015-10-16: 150 mg via ORAL
  Filled 2015-10-16: qty 1

## 2015-10-16 MED ORDER — LIDOCAINE HCL (PF) 1 % IJ SOLN
INTRAMUSCULAR | Status: AC
  Administered 2015-10-16: 5 mL
  Filled 2015-10-16: qty 5

## 2015-10-16 MED ORDER — CEFTRIAXONE SODIUM 250 MG IJ SOLR
250.0000 mg | Freq: Once | INTRAMUSCULAR | Status: AC
  Administered 2015-10-16: 250 mg via INTRAMUSCULAR
  Filled 2015-10-16: qty 250

## 2015-10-16 NOTE — ED Provider Notes (Signed)
CSN: [id number]     Arrival date & time 10/16/15  [id number] History   First MD Initiated Contact with Patient 10/16/15 [phone]     Chief Complaint  Patient presents with  . Vaginal Discharge     (Consider location/radiation/quality/duration/timing/severity/associated sxs/prior Treatment) HPI Comments: Paula Warner is a 30 y.o. female with a PMHx of asthma, who presents to the ED with complaints of ongoing vaginal discharge. Patient states she was seen last week at her primary care doctor's office, had a pelvic exam, was treated with one dose of pills for a +chlamydia (no injection given, presumed to be azithromycin but pt unsure), and was treated for a yeast infection but she cannot recall the name of the medicine. Her doctor's office called stating that she was positive for chlamydia but are eventrated. She states she continues to have brownish nonodorous vaginal discharge, which has been ongoing for weeks. She also has dysuria only after intercourse, but none otherwise. She is sexually active with one female partner, unprotected. He tested negative last week, but he is here today to be retested since she tested positive and they are sexually active. She denies any fevers, chills, chest pain, shortness breath, abdominal pain, nausea, vomiting, diarrhea, constipation, vaginal itching, genital lesions, vaginal bleeding, hematuria, urinary frequency, flank pain, numbness, tingling, or weakness. LMP was in August 2016, she has a regular menses.  She states her PCP tested for HIV and RPR which were neg. States she didn't have BV that day, and her U/A did not show a UTI.   Patient is a 30 y.o. female presenting with vaginal discharge. The history is provided by the patient. No language interpreter was used.  Vaginal Discharge Quality:  [place] Severity:  Mild Onset quality:  Gradual Duration:  2 weeks Timing:  Constant Progression:  Unchanged Chronicity:  New Context: spontaneously   Relieved by:  None  tried Worsened by:  Nothing tried Ineffective treatments:  None tried Associated symptoms: dysuria (only after intercourse, none otherwise)   Associated symptoms: no abdominal pain, no dyspareunia, no fever, no nausea, no urinary frequency, no vaginal itching and no vomiting   Risk factors: STI and unprotected sex     Past Medical History  Diagnosis Date  . Asthma    Past Surgical History  Procedure Laterality Date  . Induced abortion    . Cesarean section     No family history on file. Social History  Substance Use Topics  . Smoking status: Never Smoker   . Smokeless tobacco: None  . Alcohol Use: No   OB History    Gravida Para Term Preterm AB TAB SAB Ectopic Multiple Living   3 1   1 1    0 2     Review of Systems  Constitutional: Negative for fever and chills.  Respiratory: Negative for shortness of breath.   Cardiovascular: Negative for chest pain.  Gastrointestinal: Negative for nausea, vomiting, abdominal pain, diarrhea and constipation.  Genitourinary: Positive for dysuria (only after intercourse, none otherwise) and vaginal discharge. Negative for frequency, hematuria, vaginal bleeding, genital sores, vaginal pain, pelvic pain and dyspareunia.  Musculoskeletal: Negative for myalgias and arthralgias.  Skin: Negative for color change.  Allergic/Immunologic: Negative for immunocompromised state.  Neurological: Negative for weakness and numbness.  Psychiatric/Behavioral: Negative for confusion.   10 Systems reviewed and are negative for acute change except as noted in the HPI.    Allergies  Review of patient's allergies indicates no known allergies.  Home Medications   Prior to Admission  medications   Medication Sig Start Date End Date Taking? Authorizing Provider  fluconazole (DIFLUCAN) 100 MG tablet Take 1 tablet (100 mg total) by mouth once. Repeat dose in 48-72 hour. 08/04/15   Rachelle A Denney, CNM  Levonorgestrel-Ethinyl Estradiol (SEASONIQUE) 0.15-0.03  &0.01 MG tablet Take 1 tablet by mouth daily. 08/04/15   Rachelle A Denney, CNM  metroNIDAZOLE (FLAGYL) 500 MG tablet Take 1 tablet (500 mg total) by mouth 2 (two) times daily. 08/04/15   Rachelle A Denney, CNM  ondansetron (ZOFRAN ODT) 4 MG disintegrating tablet 4mg  ODT q4 hours prn nausea/vomit Patient not taking: Reported on 04/01/2015 03/04/15   Comer Locket, PA-C  oxyCODONE-acetaminophen (PERCOCET) 5-325 MG per tablet Take 2 tablets by mouth every 4 (four) hours as needed. Patient not taking: Reported on 04/01/2015 03/04/15   Comer Locket, PA-C  Prenatal Vit-Fe Fumarate-FA (PRENATAL COMPLETE) 14-0.4 MG TABS Take 1 tablet by mouth daily. Patient not taking: Reported on 06/23/2015 04/23/15   Leo Grosser, MD  tamsulosin (FLOMAX) 0.4 MG CAPS capsule Take 1 capsule (0.4 mg total) by mouth 2 (two) times daily. Patient not taking: Reported on 04/01/2015 03/04/15   Comer Locket, PA-C  terconazole (TERAZOL 7) 0.4 % vaginal cream Place 1 applicator vaginally at bedtime. 08/04/15   Rachelle A Denney, CNM   BP 112/78 mmHg  Pulse 87  Temp(Src) 98.3 F (36.8 C) (Oral)  Resp 18  Ht 5\' 2"  (1.575 m)  Wt 49.896 kg  BMI 20.11 kg/m2  SpO2 98% Physical Exam  Constitutional: She is oriented to person, place, and time. Vital signs are normal. She appears well-developed and well-nourished.  Non-toxic appearance. No distress.  Afebrile, nontoxic, NAD  HENT:  Head: Normocephalic and atraumatic.  Mouth/Throat: Oropharynx is clear and moist and mucous membranes are normal.  Eyes: Conjunctivae and EOM are normal. Right eye exhibits no discharge. Left eye exhibits no discharge.  Neck: Normal range of motion. Neck supple.  Cardiovascular: Normal rate, regular rhythm, normal heart sounds and intact distal pulses.  Exam reveals no gallop and no friction rub.   No murmur heard. Pulmonary/Chest: Effort normal and breath sounds normal. No respiratory distress. She has no decreased breath sounds. She has no  wheezes. She has no rhonchi. She has no rales.  Abdominal: Soft. Normal appearance and bowel sounds are normal. She exhibits no distension. There is no tenderness. There is no rigidity, no rebound, no guarding, no CVA tenderness, no tenderness at McBurney's point and negative Murphy's sign.  Soft, NTND, +BS throughout, no r/g/r, neg murphy's, neg mcburney's, no CVA TTP   Genitourinary:  Deferred given recent pelvic exam  Musculoskeletal: Normal range of motion.  Neurological: She is alert and oriented to person, place, and time. She has normal strength. No sensory deficit.  Skin: Skin is warm, dry and intact. No rash noted.  Psychiatric: She has a normal mood and affect.  Nursing note and vitals reviewed.   ED Course  Procedures (including critical care time) Labs Review Labs Reviewed  POC URINE PREG, ED    Imaging Review No results found. I have personally reviewed and evaluated these images and lab results as part of my medical decision-making.   EKG Interpretation None      MDM   Final diagnoses:  Vaginal discharge  STD (female)    30 y.o. female here with ongoing vaginal discharge. Had a pelvic exam last week at her PCP's office, was treated for yeast and states she was treated for chlamydia but isn't sure what she  got treated with. Unable to see in chart review what she was given but didn't get a shot at all, only a one time dose of pills (presumably azithromycin). No abdominal tenderness, Upreg here neg. Doubt need for repeat vaginal exam since she already had it one week ago, since we aren't sure if she was fully treated for GC/CT given the +chlamydia, will go ahead and empirically treat for GC/CT again. Will also treat for trichomonas since this is another cause of discharge and can sometimes be missed. Will re-treat for yeast since she isn't sure what she took for it. Discussed f/up with PCP in 1wk for recheck of symptoms. Doubt need for U/A since pt already had one last  week, and has no dysuria aside from right after intercourse, which is likely just urethritis. I explained the diagnosis and have given explicit precautions to return to the ER including for any other new or worsening symptoms. The patient understands and accepts the medical plan as it's been dictated and I have answered their questions. Discharge instructions concerning home care and prescriptions have been given. The patient is STABLE and is discharged to home in good condition.  BP 112/78 mmHg  Pulse 87  Temp(Src) 98.3 F (36.8 C) (Oral)  Resp 18  Ht 5\' 2"  (1.575 m)  Wt 49.896 kg  BMI 20.11 kg/m2  SpO2 98%  Meds ordered this encounter  Medications  . fluconazole (DIFLUCAN) tablet 150 mg    Sig:   . azithromycin (ZITHROMAX) tablet 1,000 mg    Sig:    And  . cefTRIAXone (ROCEPHIN) injection 250 mg    Sig:     Order Specific Question:  Antibiotic Indication:    Answer:  STD   And  . metroNIDAZOLE (FLAGYL) tablet 2,000 mg    Sig:   . lidocaine (PF) (XYLOCAINE) 1 % injection    Sig:     Smith, Tim   : cabinet override       Cesar Alf Camprubi-Soms, PA-C 10/16/15 1018  Sherisa Gilvin Camprubi-Soms, PA-C 10/16/15 1019  ADDENDUM: After pt was discharged, she was in her boyfriend's room and she vomited the pills up because she didn't get crackers before they were given. Will give zofran, and re-dose azithromycin, flagyl, and diflucan WITH FOOD. Pt agrees with this, states she doesn't really feel nauseated, she just didn't have anything on her stomach. Pt stable at time of re-dosing and repeat discharge.  BP 112/78 mmHg  Pulse 87  Temp(Src) 98.3 F (36.8 C) (Oral)  Resp 18  Ht 5\' 2"  (1.575 m)  Wt 49.896 kg  BMI 20.11 kg/m2  SpO2 98%  Meds ordered this encounter  Medications  . fluconazole (DIFLUCAN) tablet 150 mg    Sig:   . azithromycin (ZITHROMAX) tablet 1,000 mg    Sig:    And  . cefTRIAXone (ROCEPHIN) injection 250 mg    Sig:     Order Specific Question:  Antibiotic  Indication:    Answer:  STD   And  . metroNIDAZOLE (FLAGYL) tablet 2,000 mg    Sig:   . lidocaine (PF) (XYLOCAINE) 1 % injection    Sig:     Smith, Tim   : cabinet override  . ondansetron (ZOFRAN-ODT) disintegrating tablet 8 mg    Sig:   . azithromycin (ZITHROMAX) tablet 1,000 mg    Sig:    And  . metroNIDAZOLE (FLAGYL) tablet 2,000 mg    Sig:   . fluconazole (DIFLUCAN) tablet 150 mg    Sig:  [address] [place], PA-C 10/16/15 [place], MD 10/18/15 [phone]

## 2015-10-16 NOTE — Discharge Instructions (Signed)
You have been treated for gonorrhea and chlamydia again in the ER, as well as trichomonas and for yeast infection again. Do not engage in sexual intercourse until your partner has been tested and treated as well. Follow up with your regular doctor in 1 week for ongoing management for your symptoms. Return to the ER for changes or worsening symptoms.   Sexually Transmitted Disease A sexually transmitted disease (STD) is a disease or infection that may be passed (transmitted) from person to person, usually during sexual activity. This may happen by way of saliva, semen, blood, vaginal mucus, or urine. Common STDs include:  Gonorrhea.  Chlamydia.  Syphilis.  HIV and AIDS.  Genital herpes.  Hepatitis B and C.  Trichomonas.  Human papillomavirus (HPV).  Pubic lice.  Scabies.  Mites.  Bacterial vaginosis. WHAT ARE CAUSES OF STDs? An STD may be caused by bacteria, a virus, or parasites. STDs are often transmitted during sexual activity if one person is infected. However, they may also be transmitted through nonsexual means. STDs may be transmitted after:   Sexual intercourse with an infected person.  Sharing sex toys with an infected person.  Sharing needles with an infected person or using unclean piercing or tattoo needles.  Having intimate contact with the genitals, mouth, or rectal areas of an infected person.  Exposure to infected fluids during birth. WHAT ARE THE SIGNS AND SYMPTOMS OF STDs? Different STDs have different symptoms. Some people may not have any symptoms. If symptoms are present, they may include:  Painful or bloody urination.  Pain in the pelvis, abdomen, vagina, anus, throat, or eyes.  A skin rash, itching, or irritation.  Growths, ulcerations, blisters, or sores in the genital and anal areas.  Abnormal vaginal discharge with or without bad odor.  Penile discharge in men.  Fever.  Pain or bleeding during sexual intercourse.  Swollen glands in  the groin area.  Yellow skin and eyes (jaundice). This is seen with hepatitis.  Swollen testicles.  Infertility.  Sores and blisters in the mouth. HOW ARE STDs DIAGNOSED? To make a diagnosis, your health care provider may:  Take a medical history.  Perform a physical exam.  Take a sample of any discharge to examine.  Swab the throat, cervix, opening to the penis, rectum, or vagina for testing.  Test a sample of your first morning urine.  Perform blood tests.  Perform a Pap test, if this applies.  Perform a colposcopy.  Perform a laparoscopy. HOW ARE STDs TREATED? Treatment depends on the STD. Some STDs may be treated but not cured.  Chlamydia, gonorrhea, trichomonas, and syphilis can be cured with antibiotic medicine.  Genital herpes, hepatitis, and HIV can be treated, but not cured, with prescribed medicines. The medicines lessen symptoms.  Genital warts from HPV can be treated with medicine or by freezing, burning (electrocautery), or surgery. Warts may come back.  HPV cannot be cured with medicine or surgery. However, abnormal areas may be removed from the cervix, vagina, or vulva.  If your diagnosis is confirmed, your recent sexual partners need treatment. This is true even if they are symptom-free or have a negative culture or evaluation. They should not have sex until their health care providers say it is okay.  Your health care provider may test you for infection again 3 months after treatment. HOW CAN I REDUCE MY RISK OF GETTING AN STD? Take these steps to reduce your risk of getting an STD:  Use latex condoms, dental dams, and water-soluble lubricants  during sexual activity. Do not use petroleum jelly or oils.  Avoid having multiple sex partners.  Do not have sex with someone who has other sex partners  Do not have sex with anyone you do not know or who is at high risk for an STD.  Avoid risky sex practices that can break your skin.  Do not have sex if  you have open sores on your mouth or skin.  Avoid drinking too much alcohol or taking illegal drugs. Alcohol and drugs can affect your judgment and put you in a vulnerable position.  Avoid engaging in oral and anal sex acts.  Get vaccinated for HPV and hepatitis. If you have not received these vaccines in the past, talk to your health care provider about whether one or both might be right for you.  If you are at risk of being infected with HIV, it is recommended that you take a prescription medicine daily to prevent HIV infection. This is called pre-exposure prophylaxis (PrEP). You are considered at risk if:  You are a man who has sex with other men (MSM).  You are a heterosexual man or woman and are sexually active with more than one partner.  You take drugs by injection.  You are sexually active with a partner who has HIV.  Talk with your health care provider about whether you are at high risk of being infected with HIV. If you choose to begin PrEP, you should first be tested for HIV. You should then be tested every 3 months for as long as you are taking PrEP. WHAT SHOULD I DO IF I THINK I HAVE AN STD?  See your health care provider.  Tell your sexual partner(s). They should be tested and treated for any STDs.  Do not have sex until your health care provider says it is okay. WHEN SHOULD I GET IMMEDIATE MEDICAL CARE? Contact your health care provider right away if:   You have severe abdominal pain.  You are a man and notice swelling or pain in your testicles.  You are a woman and notice swelling or pain in your vagina.   This information is not intended to replace advice given to you by your health care provider. Make sure you discuss any questions you have with your health care provider.   Document Released: 12/01/2002 Document Revised: 10/01/2014 Document Reviewed: 03/31/2013 Elsevier Interactive Patient Education [employer].

## 2015-10-16 NOTE — ED Notes (Signed)
Pt reports she was seen at [facility] Monday for  Vaginal dc and dysuria.  Received a call Wednesday d/t +chlamydia and yeast infection.  Pt reports she took her abx as instructed and is still having sxs.  Pt was wondering if there's any other way to contract chlamydia because she reports that her boyfriend was checked and was negative and "I only have one partner."  Pt also reports dysuria only after having sex.

## 2015-10-16 NOTE — ED Notes (Signed)
Signature pad not working. 

## 2015-10-26 ENCOUNTER — Ambulatory Visit (INDEPENDENT_AMBULATORY_CARE_PROVIDER_SITE_OTHER): Payer: Medicaid Other | Admitting: Certified Nurse Midwife

## 2015-10-26 ENCOUNTER — Encounter: Payer: Self-pay | Admitting: Certified Nurse Midwife

## 2015-10-26 VITALS — BP 115/78 | HR 74 | Temp 98.2°F | Ht 61.25 in | Wt 114.0 lb

## 2015-10-26 DIAGNOSIS — Z01419 Encounter for gynecological examination (general) (routine) without abnormal findings: Secondary | ICD-10-CM

## 2015-10-26 DIAGNOSIS — Z0000 Encounter for general adult medical examination without abnormal findings: Secondary | ICD-10-CM | POA: Diagnosis not present

## 2015-10-26 DIAGNOSIS — N72 Inflammatory disease of cervix uteri: Secondary | ICD-10-CM

## 2015-10-26 DIAGNOSIS — N760 Acute vaginitis: Secondary | ICD-10-CM

## 2015-10-26 MED ORDER — IBUPROFEN 800 MG PO TABS: 800.0000 mg | ORAL_TABLET | Freq: Three times a day (TID) | ORAL | Status: DC | PRN

## 2015-10-26 MED ORDER — DOXYCYCLINE HYCLATE 100 MG PO TBEC: 100.0000 mg | DELAYED_RELEASE_TABLET | Freq: Two times a day (BID) | ORAL | Status: DC

## 2015-10-26 MED ORDER — TERCONAZOLE 0.4 % VA CREA: 1.0000 | TOPICAL_CREAM | Freq: Every day | VAGINAL | Status: DC

## 2015-10-26 MED ORDER — FLUCONAZOLE 100 MG PO TABS: 100.0000 mg | ORAL_TABLET | Freq: Once | ORAL | Status: DC

## 2015-10-26 NOTE — Progress Notes (Signed)
Patient ID: Paula Warner, female   DOB: 02-Aug-1986, 30 y.o.   MRN: [id number]    Subjective:      Paula Warner is a 30 y.o. female here for a routine exam.  Current complaints: vaginal discharge with itching, recent dx of chlamydia and treated.    Not having periods.  Is on Seasonique currently.  Reports bleeding and burning with sexual intercourse.  Yeast infection and chlamydia at urgent care in December.  January 17 th, was retreated.    Personal health questionnaire:  Is patient Paula Warner, have a family history of breast and/or ovarian cancer: no Is there a family history of uterine cancer diagnosed at age < 43, gastrointestinal cancer, urinary tract cancer, family member who is a Field seismologist syndrome-associated carrier: no Is the patient overweight and hypertensive, family history of diabetes, personal history of gestational diabetes, preeclampsia or PCOS: no Is patient over 9, have PCOS,  family history of premature CHD under age 65, diabetes, smoke, have hypertension or peripheral artery disease:  no At any time, has a partner hit, kicked or otherwise hurt or frightened you?: no Over the past 2 weeks, have you felt down, depressed or hopeless?: no Over the past 2 weeks, have you felt little interest or pleasure in doing things?:no   Gynecologic History No LMP recorded. Patient is not currently having periods (Reason: Oral contraceptives). Contraception: OCP (estrogen/progesterone) Last Pap: unknown. Results were: normal according to the patient Last mammogram: N/A.   Obstetric History OB History  Gravida Para Term Preterm AB SAB TAB Ectopic Multiple Living  3 1   1  1   0 2    # Outcome Date GA Lbr Len/2nd Weight Sex Delivery Anes PTL Lv  3 TAB 04/29/15          2 Gravida 04/15/10 [redacted]w[redacted]d  6 lb 6 oz (2.892 kg) M CS-LTranv Spinal N Y  1 Para 12/01/04 [redacted]w[redacted]d  6 lb 7 oz (2.92 kg) M CS-LTranv Spinal N Y     Complications: Dysfunctional Labor      Past Medical History   Diagnosis Date  . Asthma     Past Surgical History  Procedure Laterality Date  . Induced abortion    . Cesarean section       Current outpatient prescriptions:  .  Levonorgestrel-Ethinyl Estradiol (SEASONIQUE) 0.15-0.03 &0.01 MG tablet, Take 1 tablet by mouth daily., Disp: 1 Package, Rfl: 4 .  doxycycline (DORYX) 100 MG EC tablet, Take 1 tablet (100 mg total) by mouth 2 (two) times daily., Disp: 14 tablet, Rfl: 0 .  fluconazole (DIFLUCAN) 100 MG tablet, Take 1 tablet (100 mg total) by mouth once. Repeat dose in 48-72 hour., Disp: 3 tablet, Rfl: 0 .  ibuprofen (ADVIL,MOTRIN) 800 MG tablet, Take 1 tablet (800 mg total) by mouth every 8 (eight) hours as needed., Disp: 60 tablet, Rfl: 1 .  terconazole (TERAZOL 7) 0.4 % vaginal cream, Place 1 applicator vaginally at bedtime., Disp: 45 g, Rfl: 0 No Known Allergies  Social History  Substance Use Topics  . Smoking status: Never Smoker   . Smokeless tobacco: Not on file  . Alcohol Use: No    History reviewed. No pertinent family history.    Review of Systems  Constitutional: negative for fatigue and weight loss Respiratory: negative for cough and wheezing Cardiovascular: negative for chest pain, fatigue and palpitations Gastrointestinal: negative for abdominal pain and change in bowel habits Musculoskeletal:negative for myalgias Neurological: negative for gait problems and tremors Behavioral/Psych: negative for abusive  relationship, depression Endocrine: negative for temperature intolerance   Genitourinary:negative for abnormal menstrual periods, genital lesions, hot flashes, sexual problems. + vaginal discharge Integument/breast: negative for breast lump, breast tenderness, nipple discharge and skin lesion(s)    Objective:       BP 115/78 mmHg  Pulse 74  Temp(Src) 98.2 F (36.8 C)  Ht 5' 1.25" (1.556 m)  Wt 114 lb (51.71 kg)  BMI 21.36 kg/m2  Breastfeeding? No General:   alert  Skin:   no rash or abnormalities  Lungs:    clear to auscultation bilaterally  Heart:   regular rate and rhythm, S1, S2 normal, no murmur, click, rub or gallop  Breasts:   normal without suspicious masses, skin or nipple changes or axillary nodes  Abdomen:  normal findings: no organomegaly, soft, non-tender and no hernia  Pelvis:  External genitalia: normal general appearance Urinary system: urethral meatus normal and bladder without fullness, nontender Vaginal: normal without tenderness, induration or masses Cervix: irritated, red, friable Adnexa: normal bimanual exam Uterus: anteverted and non-tender, normal size   Lab Review Urine pregnancy test Labs reviewed yes Radiologic studies reviewed no  50% of 30 min visit spent on counseling and coordination of care.   Assessment:    Healthy female exam.   Vaginal discharge/pain  Cervicitis  STD screening  Plan:    Education reviewed: calcium supplements, depression evaluation, low fat, low cholesterol diet, safe sex/STD prevention, self breast exams, skin cancer screening and weight bearing exercise. Contraception: OCP (estrogen/progesterone). Follow up in: 1 years.   Meds ordered this encounter  Medications  . terconazole (TERAZOL 7) 0.4 % vaginal cream    Sig: Place 1 applicator vaginally at bedtime.    Dispense:  45 g    Refill:  0  . fluconazole (DIFLUCAN) 100 MG tablet    Sig: Take 1 tablet (100 mg total) by mouth once. Repeat dose in 48-72 hour.    Dispense:  3 tablet    Refill:  0  . ibuprofen (ADVIL,MOTRIN) 800 MG tablet    Sig: Take 1 tablet (800 mg total) by mouth every 8 (eight) hours as needed.    Dispense:  60 tablet    Refill:  1  . doxycycline (DORYX) 100 MG EC tablet    Sig: Take 1 tablet (100 mg total) by mouth 2 (two) times daily.    Dispense:  14 tablet    Refill:  0   Orders Placed This Encounter  Procedures  . Urine culture    Order Specific Question:  Source    Answer:  urine  . SureSwab Bacterial Vaginosis/itis  . GC/Chlamydia Probe  Amp    Order Specific Question:  Source    Answer:  urine   Need to obtain previous records Possible management options include: LARK Follow up as needed.

## 2015-10-27 LAB — PAP IG W/ RFLX HPV ASCU

## 2015-10-28 LAB — URINE CULTURE: Colony Count: 2000

## 2015-10-29 LAB — GC/CHLAMYDIA PROBE AMP
CT Probe RNA: NOT DETECTED
GC Probe RNA: NOT DETECTED

## 2015-10-31 LAB — SURESWAB BACTERIAL VAGINOSIS/ITIS
Atopobium vaginae: NOT DETECTED Log (cells/mL)
C. ALBICANS, DNA: NOT DETECTED
C. GLABRATA, DNA: NOT DETECTED
C. TROPICALIS, DNA: NOT DETECTED
C. parapsilosis, DNA: NOT DETECTED
Gardnerella vaginalis: NOT DETECTED Log (cells/mL)
LACTOBACILLUS SPECIES: NOT DETECTED Log (cells/mL)
MEGASPHAERA SPECIES: NOT DETECTED Log (cells/mL)
T. VAGINALIS RNA, QL TMA: NOT DETECTED

## 2015-11-23 ENCOUNTER — Ambulatory Visit (INDEPENDENT_AMBULATORY_CARE_PROVIDER_SITE_OTHER): Payer: Medicaid Other | Admitting: Certified Nurse Midwife

## 2015-11-23 ENCOUNTER — Encounter: Payer: Self-pay | Admitting: Certified Nurse Midwife

## 2015-11-23 VITALS — BP 102/66 | HR 73 | Temp 98.4°F | Wt 116.0 lb

## 2015-11-23 DIAGNOSIS — N760 Acute vaginitis: Secondary | ICD-10-CM | POA: Diagnosis not present

## 2015-11-23 MED ORDER — TERCONAZOLE 0.4 % VA CREA: 1.0000 | TOPICAL_CREAM | Freq: Every day | VAGINAL | Status: DC

## 2015-11-23 MED ORDER — FLUCONAZOLE 100 MG PO TABS: 100.0000 mg | ORAL_TABLET | Freq: Once | ORAL | Status: DC

## 2015-11-23 NOTE — Progress Notes (Signed)
Patient ID: Paula Warner, female   DOB: 05-Dec-1985, 30 y.o.   MRN: [id number]  Chief Complaint  Patient presents with  . Vaginitis    Itching, discharge, Burning sensation when urine passes over tissue    HPI Paula Warner is a 30 y.o. female.  Here for labial burning and itching that has been going on for a few weeks.  Denies any urinary symptoms.  Has tried OTC monistat cream and vagisil wipes.   Educated patient on proper perineal care, discouraged douching.  Patient verbalized understanding.  Is taking her Seasonique and doing well.  Desires to continue with OCPs.  HPI  Past Medical History  Diagnosis Date  . Asthma     Past Surgical History  Procedure Laterality Date  . Induced abortion    . Cesarean section      History reviewed. No pertinent family history.  Social History Social History  Substance Use Topics  . Smoking status: Never Smoker   . Smokeless tobacco: None  . Alcohol Use: No    No Known Allergies  Current Outpatient Prescriptions  Medication Sig Dispense Refill  . ibuprofen (ADVIL,MOTRIN) 800 MG tablet Take 1 tablet (800 mg total) by mouth every 8 (eight) hours as needed. 60 tablet 1  . Levonorgestrel-Ethinyl Estradiol (SEASONIQUE) 0.15-0.03 &0.01 MG tablet Take 1 tablet by mouth daily. 1 Package 4  . fluconazole (DIFLUCAN) 100 MG tablet Take 1 tablet (100 mg total) by mouth once. Repeat dose in 48-72 hour. 3 tablet 0  . terconazole (TERAZOL 7) 0.4 % vaginal cream Place 1 applicator vaginally at bedtime. 45 g 0   No current facility-administered medications for this visit.    Review of Systems Review of Systems Constitutional: negative for fatigue and weight loss Respiratory: negative for cough and wheezing Cardiovascular: negative for chest pain, fatigue and palpitations Gastrointestinal: negative for abdominal pain and change in bowel habits Genitourinary: + vulvovaginal irriatation Integument/breast: negative for nipple  discharge Musculoskeletal:negative for myalgias Neurological: negative for gait problems and tremors Behavioral/Psych: negative for abusive relationship, depression Endocrine: negative for temperature intolerance     Blood pressure 102/66, pulse 73, temperature 98.4 F (36.9 C), weight 116 lb (52.617 kg).  Physical Exam Physical Exam General:   alert  Skin:   no rash or abnormalities  Lungs:   clear to auscultation bilaterally  Heart:   regular rate and rhythm, S1, S2 normal, no murmur, click, rub or gallop  Breasts:   deferred  Abdomen:  normal findings: no organomegaly, soft, non-tender and no hernia  Pelvis:  External genitalia: normal general appearance, + white discharge Urinary system: urethral meatus normal and bladder without fullness, nontender Vaginal: normal without tenderness, induration or masses, + erythema Cervix: no CMT Adnexa: normal bimanual exam Uterus: anteverted and non-tender, normal size    50% of 15 min visit spent on counseling and coordination of care.   Data Reviewed Previous medical hx, meds  Assessment     Vaginitis  Contraception survallance     Plan    No orders of the defined types were placed in this encounter.   Meds ordered this encounter  Medications  . fluconazole (DIFLUCAN) 100 MG tablet    Sig: Take 1 tablet (100 mg total) by mouth once. Repeat dose in 48-72 hour.    Dispense:  3 tablet    Refill:  0  . terconazole (TERAZOL 7) 0.4 % vaginal cream    Sig: Place 1 applicator vaginally at bedtime.    Dispense:  45 g  Refill:  0    Possible management options include: biopsy if labial irritation continues Follow up as needed.

## 2015-11-27 LAB — SURESWAB, VAGINOSIS/VAGINITIS PLUS
ATOPOBIUM VAGINAE: NOT DETECTED Log (cells/mL)
C. albicans, DNA: DETECTED — AB
C. glabrata, DNA: NOT DETECTED
C. parapsilosis, DNA: NOT DETECTED
C. trachomatis RNA, TMA: NOT DETECTED
C. tropicalis, DNA: NOT DETECTED
GARDNERELLA VAGINALIS: NOT DETECTED Log (cells/mL)
LACTOBACILLUS SPECIES: 6.6 Log (cells/mL)
MEGASPHAERA SPECIES: NOT DETECTED Log (cells/mL)
N. gonorrhoeae RNA, TMA: NOT DETECTED
T. VAGINALIS RNA, QL TMA: NOT DETECTED

## 2015-11-30 ENCOUNTER — Other Ambulatory Visit: Payer: Self-pay | Admitting: Certified Nurse Midwife

## 2016-01-05 ENCOUNTER — Encounter: Payer: Self-pay | Admitting: Podiatry

## 2016-01-05 ENCOUNTER — Ambulatory Visit (INDEPENDENT_AMBULATORY_CARE_PROVIDER_SITE_OTHER): Payer: Medicaid Other | Admitting: Podiatry

## 2016-01-05 VITALS — BP 107/66 | HR 61 | Resp 12

## 2016-01-05 DIAGNOSIS — B353 Tinea pedis: Secondary | ICD-10-CM

## 2016-01-05 NOTE — Progress Notes (Signed)
   Subjective:    Patient ID: Paula Warner, female    DOB: 11-02-1985, 30 y.o.   MRN: [id number]  HPI this patient presents to the office with chief complaint of fungus between the toes on both feet. She says that the toes become moist and causes significant amount of itching between her toes both feet. She says she wants tried Lamisil spray, but discontinued its use, thinking it was ineffective. She presents the office today stating that her problem persists and there is pain and itching from between the toes on both feet. She presents the office today for an evaluation and treatment of this condition    Review of Systems  All other systems reviewed and are negative.      Objective:   Physical Exam GENERAL APPEARANCE: Alert, conversant. Appropriately groomed. No acute distress.  VASCULAR: Pedal pulses are  palpable at  [facility] and PT bilateral.  Capillary refill time is immediate to all digits,  Normal temperature gradient.  Digital hair growth is present bilateral  NEUROLOGIC: sensation is normal to 5.07 monofilament at 5/5 sites bilateral.  Light touch is intact bilateral, Muscle strength normal.  MUSCULOSKELETAL: acceptable muscle strength, tone and stability bilateral.  Intrinsic muscluature intact bilateral.  Rectus appearance of foot and digits noted bilateral.   DERMATOLOGIC: skin color, texture, and turgor are within normal limits.  No preulcerative lesions or ulcers  are seen, no interdigital maceration noted.  No open lesions present.  Digital nails are asymptomatic. No drainage noted. Maceration noted between the third and fourth left in the fourth and fifth right toes. No evidence of any redness or inflammation noted only maceration noted. At this point         Assessment & Plan:  Tinea pedis B/L  IE. Discussed Conservative care for her toes. Told her to use Lamisil cream or Lotrimin cream. She also needs to use powders in an effort to dry the area between the toes explained to  her fungus loves  to grow in a wet dark environment.  Told her to try these remedies and I believe her problem will resolve quickly   Gardiner Barefoot DPM

## 2016-01-18 ENCOUNTER — Emergency Department (HOSPITAL_COMMUNITY)
Admission: EM | Admit: 2016-01-18 | Discharge: 2016-01-18 | Disposition: A | Discharge status: Home / Self Care | Payer: Medicaid Other | Attending: Emergency Medicine | Admitting: Emergency Medicine

## 2016-01-18 ENCOUNTER — Emergency Department (HOSPITAL_COMMUNITY): Payer: Medicaid Other

## 2016-01-18 ENCOUNTER — Encounter (HOSPITAL_COMMUNITY): Payer: Self-pay

## 2016-01-18 DIAGNOSIS — R102 Pelvic and perineal pain: Secondary | ICD-10-CM

## 2016-01-18 DIAGNOSIS — Z793 Long term (current) use of hormonal contraceptives: Secondary | ICD-10-CM | POA: Insufficient documentation

## 2016-01-18 DIAGNOSIS — R52 Pain, unspecified: Secondary | ICD-10-CM

## 2016-01-18 DIAGNOSIS — J45909 Unspecified asthma, uncomplicated: Secondary | ICD-10-CM | POA: Diagnosis not present

## 2016-01-18 DIAGNOSIS — Z3202 Encounter for pregnancy test, result negative: Secondary | ICD-10-CM | POA: Diagnosis not present

## 2016-01-18 DIAGNOSIS — Z9889 Other specified postprocedural states: Secondary | ICD-10-CM | POA: Diagnosis not present

## 2016-01-18 DIAGNOSIS — R1032 Left lower quadrant pain: Secondary | ICD-10-CM | POA: Diagnosis present

## 2016-01-18 LAB — URINE MICROSCOPIC-ADD ON

## 2016-01-18 LAB — URINALYSIS, ROUTINE W REFLEX MICROSCOPIC
BILIRUBIN URINE: NEGATIVE
GLUCOSE, UA: NEGATIVE mg/dL
Hgb urine dipstick: NEGATIVE
KETONES UR: NEGATIVE mg/dL
Nitrite: NEGATIVE
Protein, ur: NEGATIVE mg/dL
Specific Gravity, Urine: 1.035 — ABNORMAL HIGH (ref 1.005–1.030)
pH: 6.5 (ref 5.0–8.0)

## 2016-01-18 LAB — WET PREP, GENITAL
Clue Cells Wet Prep HPF POC: NONE SEEN
SPERM: NONE SEEN
Trich, Wet Prep: NONE SEEN
Yeast Wet Prep HPF POC: NONE SEEN

## 2016-01-18 LAB — I-STAT BETA HCG BLOOD, ED (MC, WL, AP ONLY)

## 2016-01-18 MED ORDER — LIDOCAINE HCL (PF) 1 % IJ SOLN
INTRAMUSCULAR | Status: AC
  Administered 2016-01-18: 0.9 mL
  Filled 2016-01-18: qty 5

## 2016-01-18 MED ORDER — CEFTRIAXONE SODIUM 250 MG IJ SOLR
250.0000 mg | INTRAMUSCULAR | Status: DC
  Administered 2016-01-18: 250 mg via INTRAMUSCULAR
  Filled 2016-01-18: qty 250

## 2016-01-18 MED ORDER — AZITHROMYCIN 250 MG PO TABS
1000.0000 mg | ORAL_TABLET | Freq: Once | ORAL | Status: AC
  Administered 2016-01-18: 1000 mg via ORAL
  Filled 2016-01-18: qty 4

## 2016-01-18 NOTE — Discharge Instructions (Signed)

## 2016-01-18 NOTE — ED Notes (Signed)
Patient c/o left groin pain that radiates into the left thigh x 3 days. Patient states when she sneezes or coughs the pain radiates across the the pelvic area to the right groin area.

## 2016-01-18 NOTE — ED Provider Notes (Signed)
CSN: [id number]     Arrival date & time 01/18/16  1342 History   First MD Initiated Contact with Patient 01/18/16 1814     Chief Complaint  Patient presents with  . Groin Pain     (Consider location/radiation/quality/duration/timing/severity/associated sxs/prior Treatment) HPI Comments: Patient presents with pain in her left groin. She states it started about 3 days ago and has been gradually worsening since then. It starts in her left lower abdomen and radiates to the groin. It's worse with coughing and sneezing. She denies any nausea or vomiting. No vaginal bleeding or discharge. No urinary symptoms. No back pain. She denies any fevers. Bowel movements. She hasn't taken anything for the pain.   Past Medical History  Diagnosis Date  . Asthma    Past Surgical History  Procedure Laterality Date  . Induced abortion    . Cesarean section     No family history on file. Social History  Substance Use Topics  . Smoking status: Never Smoker   . Smokeless tobacco: None  . Alcohol Use: No   OB History    Gravida Para Term Preterm AB TAB SAB Ectopic Multiple Living   3 1   1 1    0 2     Review of Systems  Constitutional: Negative for fever, chills, diaphoresis and fatigue.  HENT: Negative for congestion, rhinorrhea and sneezing.   Eyes: Negative.   Respiratory: Negative for cough, chest tightness and shortness of breath.   Cardiovascular: Negative for chest pain and leg swelling.  Gastrointestinal: Negative for nausea, vomiting, abdominal pain, diarrhea and blood in stool.  Genitourinary: Positive for pelvic pain. Negative for frequency, hematuria, flank pain, vaginal bleeding, vaginal discharge, difficulty urinating and vaginal pain.  Musculoskeletal: Negative for back pain and arthralgias.  Skin: Negative for rash.  Neurological: Negative for dizziness, speech difficulty, weakness, numbness and headaches.      Allergies  Review of patient's allergies indicates no known  allergies.  Home Medications   Prior to Admission medications   Medication Sig Start Date End Date Taking? Authorizing Provider  Levonorgestrel-Ethinyl Estradiol (SEASONIQUE) 0.15-0.03 &0.01 MG tablet Take 1 tablet by mouth daily. 08/04/15  Yes Rachelle A Denney, CNM  fluconazole (DIFLUCAN) 100 MG tablet Take 1 tablet (100 mg total) by mouth once. Repeat dose in 48-72 hour. Patient not taking: Reported on 01/05/2016 11/23/15   Rachelle A Denney, CNM  ibuprofen (ADVIL,MOTRIN) 800 MG tablet Take 1 tablet (800 mg total) by mouth every 8 (eight) hours as needed. Patient not taking: Reported on 01/05/2016 10/26/15   Morene Crocker, CNM  terconazole (TERAZOL 7) 0.4 % vaginal cream Place 1 applicator vaginally at bedtime. Patient not taking: Reported on 01/05/2016 11/23/15   Rachelle A Denney, CNM   BP 108/73 mmHg  Pulse 82  Temp(Src) 98.5 F (36.9 C) (Oral)  Resp 18  Ht 5\' 1"  (1.549 m)  Wt 123 lb (55.792 kg)  BMI 23.25 kg/m2  SpO2 100%  LMP 10/26/2015 Physical Exam  Constitutional: She is oriented to person, place, and time. She appears well-developed and well-nourished.  HENT:  Head: Normocephalic and atraumatic.  Eyes: Pupils are equal, round, and reactive to light.  Neck: Normal range of motion. Neck supple.  Cardiovascular: Normal rate, regular rhythm and normal heart sounds.   Pulmonary/Chest: Effort normal and breath sounds normal. No respiratory distress. She has no wheezes. She has no rales. She exhibits no tenderness.  Abdominal: Soft. Bowel sounds are normal. There is no tenderness. There is no rebound  and no guarding.  +tenderness in left pelvic area, no CVA tenderness, no suprapubic tenderness  Genitourinary:  Moderate amount of thin, clear discharge.  No CMT.  +left adnexal tenderness  Musculoskeletal: Normal range of motion. She exhibits no edema.  Lymphadenopathy:    She has no cervical adenopathy.  Neurological: She is alert and oriented to person, place, and time.  Skin:  Skin is warm and dry. No rash noted.  Psychiatric: She has a normal mood and affect.    ED Course  Procedures (including critical care time) Labs Review Results for orders placed or performed during the hospital encounter of 01/18/16  Wet prep, genital  Result Value Ref Range   Yeast Wet Prep HPF POC NONE SEEN NONE SEEN   Trich, Wet Prep NONE SEEN NONE SEEN   Clue Cells Wet Prep HPF POC NONE SEEN NONE SEEN   WBC, Wet Prep HPF POC FEW (A) NONE SEEN   Sperm NONE SEEN   Urinalysis, Routine w reflex microscopic  Result Value Ref Range   Color, Urine YELLOW YELLOW   APPearance CLOUDY (A) CLEAR   Specific Gravity, Urine 1.035 (H) 1.005 - 1.030   pH 6.5 5.0 - 8.0   Glucose, UA NEGATIVE NEGATIVE mg/dL   Hgb urine dipstick NEGATIVE NEGATIVE   Bilirubin Urine NEGATIVE NEGATIVE   Ketones, ur NEGATIVE NEGATIVE mg/dL   Protein, ur NEGATIVE NEGATIVE mg/dL   Nitrite NEGATIVE NEGATIVE   Leukocytes, UA SMALL (A) NEGATIVE  Urine microscopic-add on  Result Value Ref Range   Squamous Epithelial / LPF 6-30 (A) NONE SEEN   WBC, UA 0-5 0 - 5 WBC/hpf   RBC / HPF 0-5 0 - 5 RBC/hpf   Bacteria, UA MANY (A) NONE SEEN   Urine-Other MUCOUS PRESENT   I-Stat beta hCG blood, ED  Result Value Ref Range   I-stat hCG, quantitative <5.0 <5 mIU/mL   Comment 3           US Transvaginal Non-ob  01/18/2016  CLINICAL DATA:  Left lower quadrant pain.  Pain for 3 days. EXAM: TRANSABDOMINAL AND TRANSVAGINAL ULTRASOUND OF PELVIS DOPPLER ULTRASOUND OF OVARIES TECHNIQUE: Both transabdominal and transvaginal ultrasound examinations of the pelvis were performed. Transabdominal technique was performed for global imaging of the pelvis including uterus, ovaries, adnexal regions, and pelvic cul-de-sac. It was necessary to proceed with endovaginal exam following the transabdominal exam to visualize the endometrium and ovaries. Color and duplex Doppler ultrasound was utilized to evaluate blood flow to the ovaries. COMPARISON:   None. FINDINGS: Uterus Measurements: 10.1 x 5.4 x 6 cm. Possible 3 x 1.8 x 1.6 cm hypoechoic right fundal uterine mass likely representing a sub serosal fibroid. Endometrium Thickness: 6.5 mm.  No focal abnormality visualized. Right ovary Measurements: 1.6 x 1.2 x 1.8 cm. Normal appearance/no adnexal mass. Left ovary Not visualize Pulsed Doppler evaluation of the right ovary demonstrates normal low-resistance arterial and venous waveforms. Other findings No abnormal free fluid. IMPRESSION: 1. Nonvisualized left ovary. 2. Normal right ovary.  No right ovarian torsion. Electronically Signed   By: Kathreen Devoid   On: 01/18/2016 19:55   US Pelvis Complete  01/18/2016  CLINICAL DATA:  Left lower quadrant pain.  Pain for 3 days. EXAM: TRANSABDOMINAL AND TRANSVAGINAL ULTRASOUND OF PELVIS DOPPLER ULTRASOUND OF OVARIES TECHNIQUE: Both transabdominal and transvaginal ultrasound examinations of the pelvis were performed. Transabdominal technique was performed for global imaging of the pelvis including uterus, ovaries, adnexal regions, and pelvic cul-de-sac. It was necessary to proceed with endovaginal  exam following the transabdominal exam to visualize the endometrium and ovaries. Color and duplex Doppler ultrasound was utilized to evaluate blood flow to the ovaries. COMPARISON:  None. FINDINGS: Uterus Measurements: 10.1 x 5.4 x 6 cm. Possible 3 x 1.8 x 1.6 cm hypoechoic right fundal uterine mass likely representing a sub serosal fibroid. Endometrium Thickness: 6.5 mm.  No focal abnormality visualized. Right ovary Measurements: 1.6 x 1.2 x 1.8 cm. Normal appearance/no adnexal mass. Left ovary Not visualize Pulsed Doppler evaluation of the right ovary demonstrates normal low-resistance arterial and venous waveforms. Other findings No abnormal free fluid. IMPRESSION: 1. Nonvisualized left ovary. 2. Normal right ovary.  No right ovarian torsion. Electronically Signed   By: Kathreen Devoid   On: 01/18/2016 19:55   [place] Art/ven Flow  Abd Pelv Doppler  01/18/2016  CLINICAL DATA:  Left lower quadrant pain.  Pain for 3 days. EXAM: TRANSABDOMINAL AND TRANSVAGINAL ULTRASOUND OF PELVIS DOPPLER ULTRASOUND OF OVARIES TECHNIQUE: Both transabdominal and transvaginal ultrasound examinations of the pelvis were performed. Transabdominal technique was performed for global imaging of the pelvis including uterus, ovaries, adnexal regions, and pelvic cul-de-sac. It was necessary to proceed with endovaginal exam following the transabdominal exam to visualize the endometrium and ovaries. Color and duplex Doppler ultrasound was utilized to evaluate blood flow to the ovaries. COMPARISON:  None. FINDINGS: Uterus Measurements: 10.1 x 5.4 x 6 cm. Possible 3 x 1.8 x 1.6 cm hypoechoic right fundal uterine mass likely representing a sub serosal fibroid. Endometrium Thickness: 6.5 mm.  No focal abnormality visualized. Right ovary Measurements: 1.6 x 1.2 x 1.8 cm. Normal appearance/no adnexal mass. Left ovary Not visualize Pulsed Doppler evaluation of the right ovary demonstrates normal low-resistance arterial and venous waveforms. Other findings No abnormal free fluid. IMPRESSION: 1. Nonvisualized left ovary. 2. Normal right ovary.  No right ovarian torsion. Electronically Signed   By: Kathreen Devoid   On: 01/18/2016 19:55      Imaging Review US Transvaginal Non-ob  01/18/2016  CLINICAL DATA:  Left lower quadrant pain.  Pain for 3 days. EXAM: TRANSABDOMINAL AND TRANSVAGINAL ULTRASOUND OF PELVIS DOPPLER ULTRASOUND OF OVARIES TECHNIQUE: Both transabdominal and transvaginal ultrasound examinations of the pelvis were performed. Transabdominal technique was performed for global imaging of the pelvis including uterus, ovaries, adnexal regions, and pelvic cul-de-sac. It was necessary to proceed with endovaginal exam following the transabdominal exam to visualize the endometrium and ovaries. Color and duplex Doppler ultrasound was utilized to evaluate blood flow to the  ovaries. COMPARISON:  None. FINDINGS: Uterus Measurements: 10.1 x 5.4 x 6 cm. Possible 3 x 1.8 x 1.6 cm hypoechoic right fundal uterine mass likely representing a sub serosal fibroid. Endometrium Thickness: 6.5 mm.  No focal abnormality visualized. Right ovary Measurements: 1.6 x 1.2 x 1.8 cm. Normal appearance/no adnexal mass. Left ovary Not visualize Pulsed Doppler evaluation of the right ovary demonstrates normal low-resistance arterial and venous waveforms. Other findings No abnormal free fluid. IMPRESSION: 1. Nonvisualized left ovary. 2. Normal right ovary.  No right ovarian torsion. Electronically Signed   By: Kathreen Devoid   On: 01/18/2016 19:55   US Pelvis Complete  01/18/2016  CLINICAL DATA:  Left lower quadrant pain.  Pain for 3 days. EXAM: TRANSABDOMINAL AND TRANSVAGINAL ULTRASOUND OF PELVIS DOPPLER ULTRASOUND OF OVARIES TECHNIQUE: Both transabdominal and transvaginal ultrasound examinations of the pelvis were performed. Transabdominal technique was performed for global imaging of the pelvis including uterus, ovaries, adnexal regions, and pelvic cul-de-sac. It was necessary to proceed with endovaginal exam following  the transabdominal exam to visualize the endometrium and ovaries. Color and duplex Doppler ultrasound was utilized to evaluate blood flow to the ovaries. COMPARISON:  None. FINDINGS: Uterus Measurements: 10.1 x 5.4 x 6 cm. Possible 3 x 1.8 x 1.6 cm hypoechoic right fundal uterine mass likely representing a sub serosal fibroid. Endometrium Thickness: 6.5 mm.  No focal abnormality visualized. Right ovary Measurements: 1.6 x 1.2 x 1.8 cm. Normal appearance/no adnexal mass. Left ovary Not visualize Pulsed Doppler evaluation of the right ovary demonstrates normal low-resistance arterial and venous waveforms. Other findings No abnormal free fluid. IMPRESSION: 1. Nonvisualized left ovary. 2. Normal right ovary.  No right ovarian torsion. Electronically Signed   By: Kathreen Devoid   On: 01/18/2016  19:55   [place] Art/ven Flow Abd Pelv Doppler  01/18/2016  CLINICAL DATA:  Left lower quadrant pain.  Pain for 3 days. EXAM: TRANSABDOMINAL AND TRANSVAGINAL ULTRASOUND OF PELVIS DOPPLER ULTRASOUND OF OVARIES TECHNIQUE: Both transabdominal and transvaginal ultrasound examinations of the pelvis were performed. Transabdominal technique was performed for global imaging of the pelvis including uterus, ovaries, adnexal regions, and pelvic cul-de-sac. It was necessary to proceed with endovaginal exam following the transabdominal exam to visualize the endometrium and ovaries. Color and duplex Doppler ultrasound was utilized to evaluate blood flow to the ovaries. COMPARISON:  None. FINDINGS: Uterus Measurements: 10.1 x 5.4 x 6 cm. Possible 3 x 1.8 x 1.6 cm hypoechoic right fundal uterine mass likely representing a sub serosal fibroid. Endometrium Thickness: 6.5 mm.  No focal abnormality visualized. Right ovary Measurements: 1.6 x 1.2 x 1.8 cm. Normal appearance/no adnexal mass. Left ovary Not visualize Pulsed Doppler evaluation of the right ovary demonstrates normal low-resistance arterial and venous waveforms. Other findings No abnormal free fluid. IMPRESSION: 1. Nonvisualized left ovary. 2. Normal right ovary.  No right ovarian torsion. Electronically Signed   By: Kathreen Devoid   On: 01/18/2016 19:55   I have personally reviewed and evaluated these images and lab results as part of my medical decision-making.   EKG Interpretation None      MDM   Final diagnoses:  Pelvic pain in female    Patient presents with left-sided pelvic pain. There is no evidence of torsion. Her pregnancy test is negative. There is no evidence of tubo-ovarian abscess. She is not systemically ill appearing. She does have positive vaginal discharge. She was treated in the ED with Rocephin and Zithromax. She was advised to use ibuprofen or Tylenol for symptomatic relief. She was advised to follow-up with her oncologist if her symptoms are  not better within the next 2-3 days or return here as needed for any worsening symptoms.    Malvin Johns, MD 01/18/16 2002

## 2016-01-18 NOTE — ED Notes (Signed)
Bed: [id number] Expected date:  Expected time:  Means of arrival:  Comments: EMS- 30yo M, arm infection/cellulitis?

## 2016-01-18 NOTE — ED Notes (Signed)
[place] in to room.

## 2016-01-19 LAB — GC/CHLAMYDIA PROBE AMP (~~LOC~~) NOT AT ARMC
CHLAMYDIA, DNA PROBE: NEGATIVE
Neisseria Gonorrhea: NEGATIVE

## 2016-01-19 LAB — RPR: RPR Ser Ql: NONREACTIVE

## 2016-01-19 LAB — HIV ANTIBODY (ROUTINE TESTING W REFLEX): HIV SCREEN 4TH GENERATION: NONREACTIVE

## 2016-01-20 ENCOUNTER — Encounter (HOSPITAL_COMMUNITY): Payer: Self-pay | Admitting: *Deleted

## 2016-01-20 ENCOUNTER — Emergency Department (HOSPITAL_COMMUNITY): Payer: Medicaid Other

## 2016-01-20 ENCOUNTER — Other Ambulatory Visit: Payer: Self-pay | Admitting: Certified Nurse Midwife

## 2016-01-20 ENCOUNTER — Ambulatory Visit (HOSPITAL_BASED_OUTPATIENT_CLINIC_OR_DEPARTMENT_OTHER)
Admit: 2016-01-20 | Discharge: 2016-01-20 | Disposition: A | Discharge status: Home / Self Care | Payer: Medicaid Other | Attending: Emergency Medicine | Admitting: Emergency Medicine

## 2016-01-20 ENCOUNTER — Emergency Department (HOSPITAL_COMMUNITY)
Admission: EM | Admit: 2016-01-20 | Discharge: 2016-01-20 | Disposition: A | Discharge status: Home / Self Care | Payer: Medicaid Other | Attending: Emergency Medicine | Admitting: Emergency Medicine

## 2016-01-20 DIAGNOSIS — Z791 Long term (current) use of non-steroidal anti-inflammatories (NSAID): Secondary | ICD-10-CM | POA: Diagnosis not present

## 2016-01-20 DIAGNOSIS — M79652 Pain in left thigh: Secondary | ICD-10-CM | POA: Diagnosis present

## 2016-01-20 DIAGNOSIS — J45909 Unspecified asthma, uncomplicated: Secondary | ICD-10-CM | POA: Diagnosis not present

## 2016-01-20 DIAGNOSIS — I824Y2 Acute embolism and thrombosis of unspecified deep veins of left proximal lower extremity: Secondary | ICD-10-CM

## 2016-01-20 DIAGNOSIS — I8000 Phlebitis and thrombophlebitis of superficial vessels of unspecified lower extremity: Secondary | ICD-10-CM | POA: Diagnosis not present

## 2016-01-20 DIAGNOSIS — Z79899 Other long term (current) drug therapy: Secondary | ICD-10-CM | POA: Insufficient documentation

## 2016-01-20 LAB — CBC WITH DIFFERENTIAL/PLATELET
BASOS PCT: 0 %
Basophils Absolute: 0 10*3/uL (ref 0.0–0.1)
EOS ABS: 0.1 10*3/uL (ref 0.0–0.7)
EOS PCT: 1 %
HCT: 38.4 % (ref 36.0–46.0)
Hemoglobin: 12.8 g/dL (ref 12.0–15.0)
Lymphocytes Relative: 12 %
Lymphs Abs: 1.7 10*3/uL (ref 0.7–4.0)
MCH: 31.9 pg (ref 26.0–34.0)
MCHC: 33.3 g/dL (ref 30.0–36.0)
MCV: 95.8 fL (ref 78.0–100.0)
MONO ABS: 1.2 10*3/uL — AB (ref 0.1–1.0)
MONOS PCT: 8 %
Neutro Abs: 11.3 10*3/uL — ABNORMAL HIGH (ref 1.7–7.7)
Neutrophils Relative %: 79 %
Platelets: 223 10*3/uL (ref 150–400)
RBC: 4.01 MIL/uL (ref 3.87–5.11)
RDW: 12.6 % (ref 11.5–15.5)
WBC: 14.3 10*3/uL — ABNORMAL HIGH (ref 4.0–10.5)

## 2016-01-20 LAB — I-STAT CHEM 8, ED
BUN: 10 mg/dL (ref 6–20)
CALCIUM ION: 1.05 mmol/L — AB (ref 1.12–1.23)
Chloride: 104 mmol/L (ref 101–111)
Creatinine, Ser: 0.6 mg/dL (ref 0.44–1.00)
Glucose, Bld: 74 mg/dL (ref 65–99)
HEMATOCRIT: 43 % (ref 36.0–46.0)
HEMOGLOBIN: 14.6 g/dL (ref 12.0–15.0)
Potassium: 3.8 mmol/L (ref 3.5–5.1)
SODIUM: 141 mmol/L (ref 135–145)
TCO2: 25 mmol/L (ref 0–100)

## 2016-01-20 IMAGING — CR DG FEMUR 2+V*L*
4 series · 4 of 4 positions shown · non-contrast
Comparison: None.

CLINICAL DATA: Left thigh pain, left groin pain for approximately 5
days.

EXAM:
LEFT FEMUR 2 VIEWS

[t femur proximal ap left]
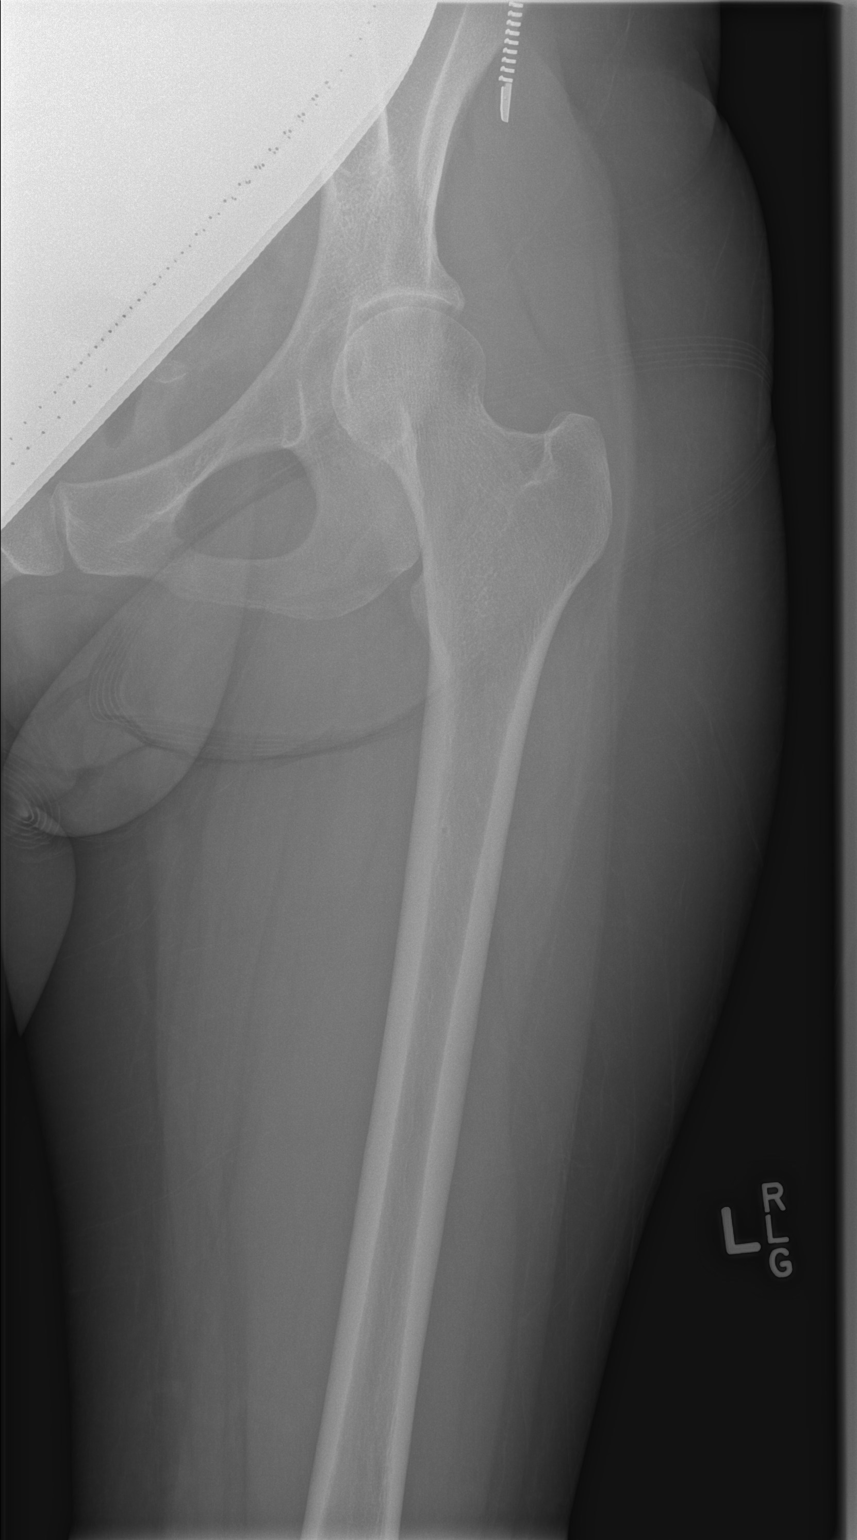

[t femur distal ap left]
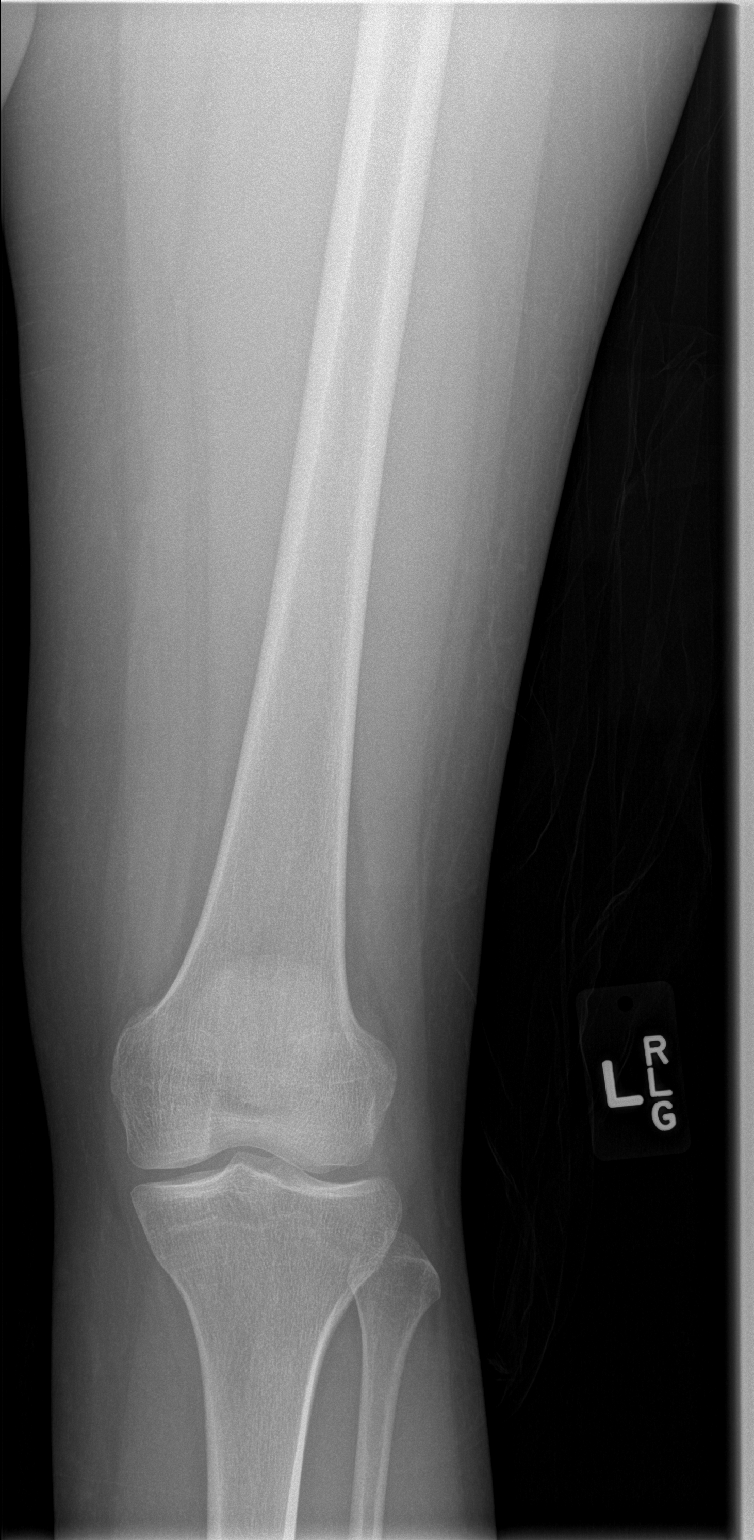

[t femur proximal lat left]
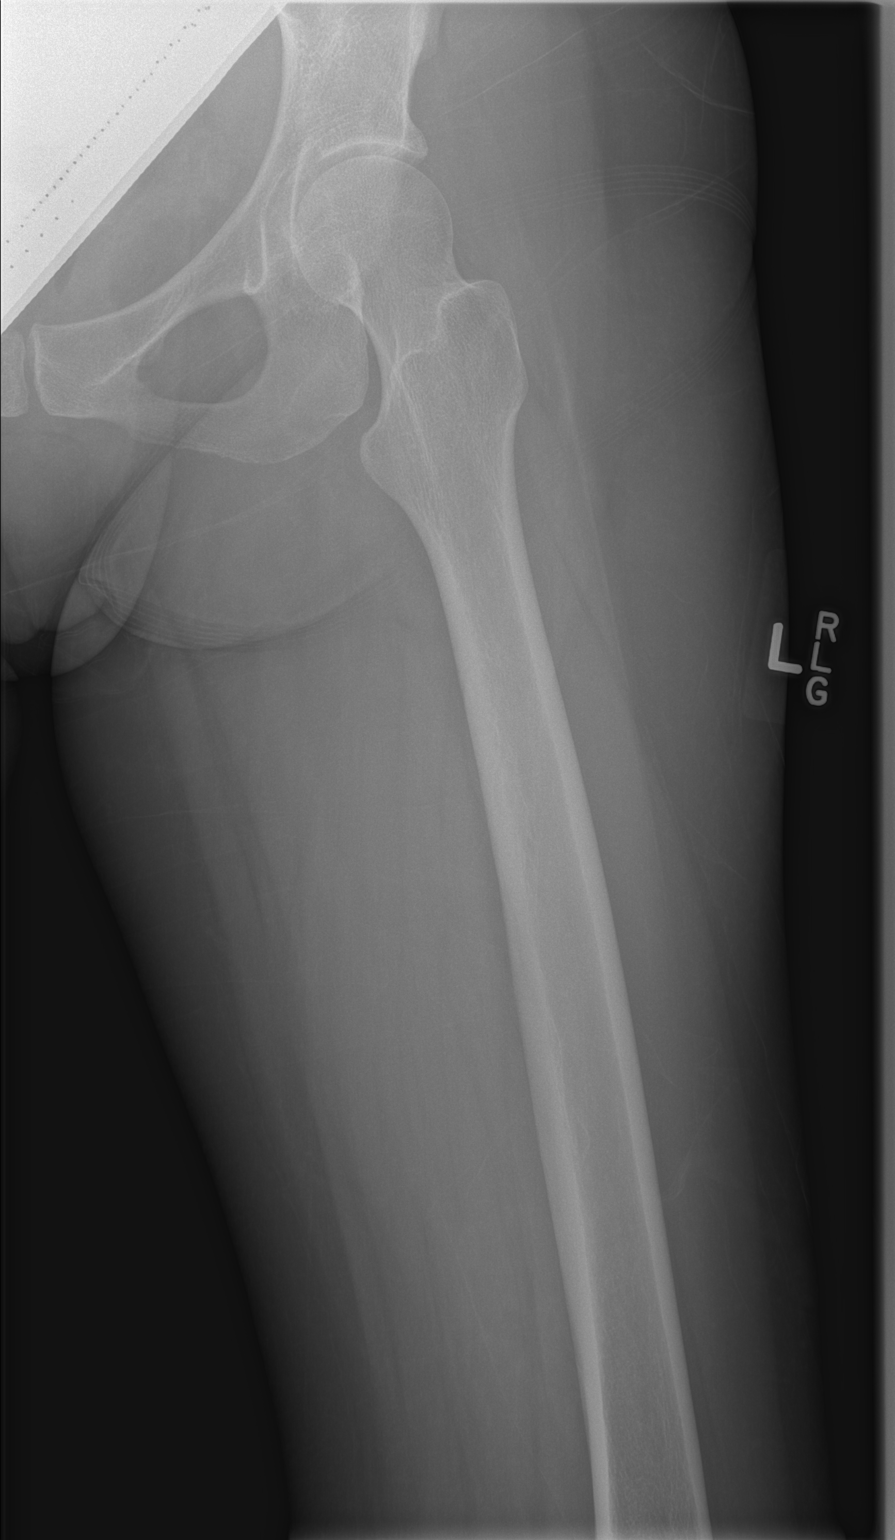

[t femur distal lat left]
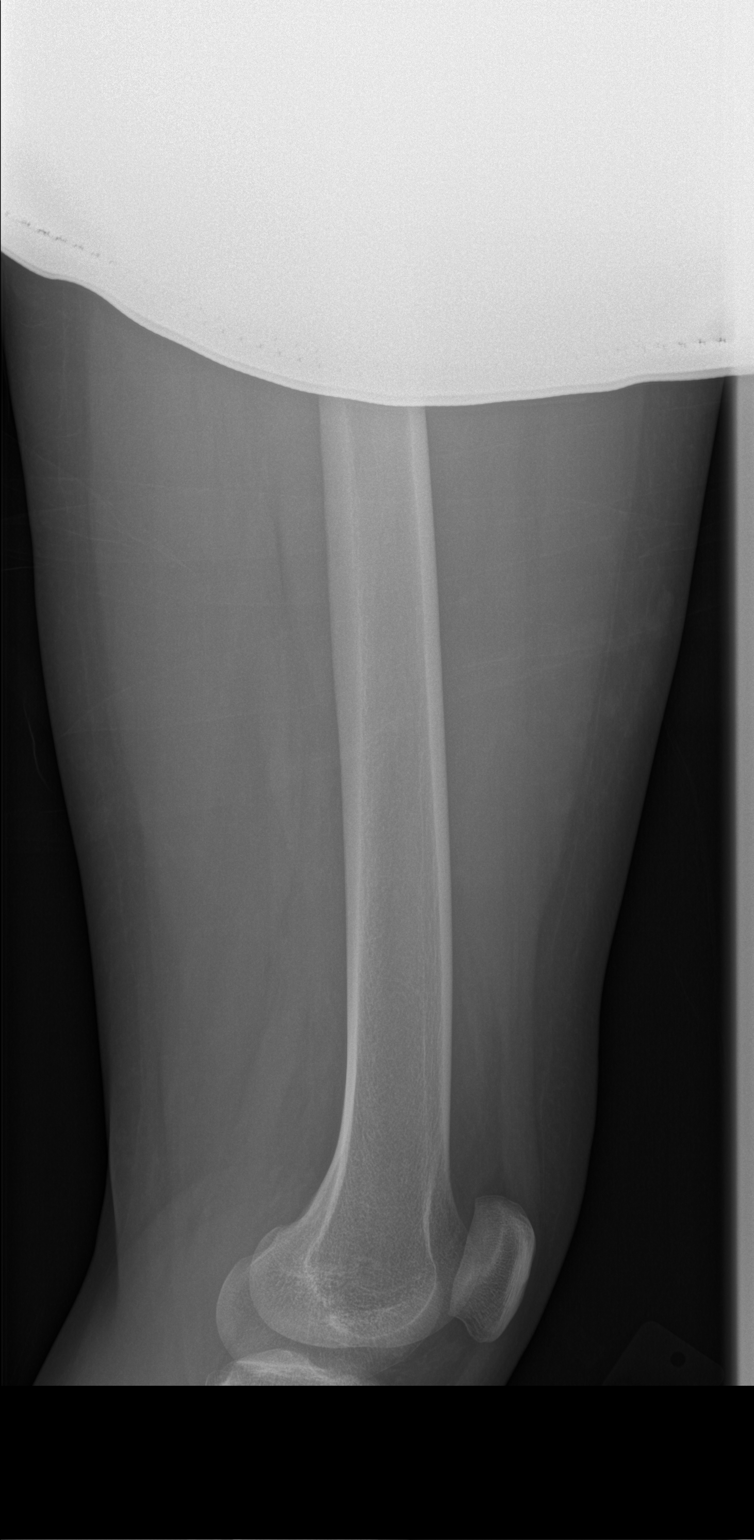

[4 of 4 positions shown; findings below may reference images not displayed]

FINDINGS: There is no evidence of fracture or other focal bone lesions. Soft
tissues are unremarkable.
IMPRESSION: Negative.

## 2016-01-20 MED ORDER — HYDROCODONE-ACETAMINOPHEN 5-325 MG PO TABS: 1.0000 | ORAL_TABLET | Freq: Four times a day (QID) | ORAL | Status: DC | PRN

## 2016-01-20 MED ORDER — RIVAROXABAN (XARELTO) EDUCATION KIT FOR DVT/PE PATIENTS
PACK | Freq: Once | Status: AC
  Administered 2016-01-20: 15:00:00
  Filled 2016-01-20: qty 1

## 2016-01-20 MED ORDER — RIVAROXABAN 15 MG PO TABS
15.0000 mg | ORAL_TABLET | Freq: Once | ORAL | Status: AC
  Administered 2016-01-20: 15 mg via ORAL
  Filled 2016-01-20: qty 1

## 2016-01-20 MED ORDER — RIVAROXABAN (XARELTO) VTE STARTER PACK (15 & 20 MG): ORAL_TABLET | ORAL | Status: DC

## 2016-01-20 NOTE — ED Notes (Signed)
C/o pain in left groin area and upper anterior left thigh. Seen here 2 days ago for same

## 2016-01-20 NOTE — ED Notes (Signed)
Bed: WA02 Expected date:  Expected time:  Means of arrival:  Comments: 

## 2016-01-20 NOTE — Discharge Instructions (Signed)
You have been diagnosed with having Blood clot in your left leg. Please take blood thinning medication (Xarelto) as prescribed for the full duration. You would likely need to be on blood thinning medication for at least 6 months so therefore please follow-up with your doctor for medication refill and close monitoring. Take pain medication as needed for pain.  Stop taking birth control pills as it may have caused your blood clot.  Return to the ER if you develop acute onset of chest pain, shortness of breath or coughing up blood.  Deep Vein Thrombosis A deep vein thrombosis (DVT) is a blood clot (thrombus) that usually occurs in a deep, larger vein of the lower leg or the pelvis, or in an upper extremity such as the arm. These are dangerous and can lead to serious and even life-threatening complications if the clot travels to the lungs. A DVT can damage the valves in your leg veins so that instead of flowing upward, the blood pools in the lower leg. This is called post-thrombotic syndrome, and it can result in pain, swelling, discoloration, and sores on the leg. CAUSES A DVT is caused by the formation of a blood clot in your leg, pelvis, or arm. Usually, several things contribute to the formation of blood clots. A clot may develop when:  Your blood flow slows down.  Your vein becomes damaged in some way.  You have a condition that makes your blood clot more easily. RISK FACTORS A DVT is more likely to develop in:  People who are older, especially over 74 years of age.  People who are overweight (obese).  People who sit or lie still for a long time, such as during long-distance travel (over 4 hours), bed rest, hospitalization, or during recovery from certain medical conditions like a stroke.  People who do not engage in much physical activity (sedentary lifestyle).  People who have chronic breathing disorders.  People who have a personal or family history of blood clots or blood clotting  disease.  People who have peripheral vascular disease (PVD), diabetes, or some types of cancer.  People who have heart disease, especially if the person had a recent heart attack or has congestive heart failure.  People who have neurological diseases that affect the legs (leg paresis).  People who have had a traumatic injury, such as breaking a hip or leg.  People who have recently had major or lengthy surgery, especially on the hip, knee, or abdomen.  People who have had a central line placed inside a large vein.  People who take medicines that contain the hormone estrogen. These include birth control pills and hormone replacement therapy.  Pregnancy or during childbirth or the postpartum period.  Long plane flights (over 8 hours). SIGNS AND SYMPTOMS Symptoms of a DVT can include:   Swelling of your leg or arm, especially if one side is much worse.  Warmth and redness of your leg or arm, especially if one side is much worse.  Pain in your arm or leg. If the clot is in your leg, symptoms may be more noticeable or worse when you stand or walk.  A feeling of pins and needles, if the clot is in the arm. The symptoms of a DVT that has traveled to the lungs (pulmonary embolism, PE) usually start suddenly and include:  Shortness of breath while active or at rest.  Coughing or coughing up blood or blood-tinged mucus.  Chest pain that is often worse with deep breaths.  Rapid  or irregular heartbeat.  Feeling light-headed or dizzy.  Fainting.  Feeling anxious.  Sweating. There may also be pain and swelling in a leg if that is where the blood clot started. These symptoms may represent a serious problem that is an emergency. Do not wait to see if the symptoms will go away. Get medical help right away. Call your local emergency services (911 in the [place] Do not drive yourself to the hospital. DIAGNOSIS Your health care provider will take a medical history and perform a physical  exam. You may also have other tests, including:  Blood tests to assess the clotting properties of your blood.  Imaging tests, such as CT, ultrasound, MRI, X-ray, and other tests to see if you have clots anywhere in your body. TREATMENT After a DVT is identified, it can be treated. The type of treatment that you receive depends on many factors, such as the cause of your DVT, your risk for bleeding or developing more clots, and other medical conditions that you have. Sometimes, a combination of treatments is necessary. Treatment options may be combined and include:  Monitoring the blood clot with ultrasound.  Taking medicines by mouth, such as newer blood thinners (anticoagulants), thrombolytics, or warfarin.  Taking anticoagulant medicine by injection or through an IV tube.  Wearing compression stockings or using different types ofdevices.  Surgery (rare) to remove the blood clot or to place a filter in your abdomen to stop the blood clot from traveling to your lungs. Treatments for a DVT are often divided into immediate treatment and long-term treatment (up to 3 months after DVT). You can work with your health care provider to choose the treatment program that is best for you. HOME CARE INSTRUCTIONS If you are taking a newer oral anticoagulant:  Take the medicine every single day at the same time each day.  Understand what foods and drugs interact with this medicine.  Understand that there are no regular blood tests required when using this medicine.  Understand the side effects of this medicine, including excessive bruising or bleeding. Ask your health care provider or pharmacist about other possible side effects. If you are taking warfarin:  Understand how to take warfarin and know which foods can affect how warfarin works in Veterinary surgeon.  Understand that it is dangerous to take too much or too little warfarin. Too much warfarin increases the risk of bleeding. Too little warfarin  continues to allow the risk for blood clots.  Follow your PT and INR blood testing schedule. The PT and INR results allow your health care provider to adjust your dose of warfarin. It is very important that you have your PT and INR tested as often as told by your health care provider.  Avoid major changes in your diet, or tell your health care provider before you change your diet. Arrange a visit with a registered dietitian to answer your questions. Many foods, especially foods that are high in vitamin K, can interfere with warfarin and affect the PT and INR results. Eat a consistent amount of foods that are high in vitamin K, such as:  Spinach, kale, broccoli, cabbage, collard greens, turnip greens, Brussels sprouts, peas, cauliflower, seaweed, and parsley.  Beef liver and pork liver.  Green tea.  Soybean oil.  Tell your health care provider about any and all medicines, vitamins, and supplements that you take, including aspirin and other over-the-counter anti-inflammatory medicines. Be especially cautious with aspirin and anti-inflammatory medicines. Do not take those before you ask  your health care provider if it is safe to do so. This is important because many medicines can interfere with warfarin and affect the PT and INR results.  Do not start or stop taking any over-the-counter or prescription medicine unless your health care provider or pharmacist tells you to do so. If you take warfarin, you will also need to do these things:  Hold pressure over cuts for longer than usual.  Tell your dentist and other health care providers that you are taking warfarin before you have any procedures in which bleeding may occur.  Avoid alcohol or drink very small amounts. Tell your health care provider if you change your alcohol intake.  Do not use tobacco products, including cigarettes, chewing tobacco, and e-cigarettes. If you need help quitting, ask your health care provider.  Avoid contact  sports. General Instructions  Take over-the-counter and prescription medicines only as told by your health care provider. Anticoagulant medicines can have side effects, including easy bruising and difficulty stopping bleeding. If you are prescribed an anticoagulant, you will also need to do these things:  Hold pressure over cuts for longer than usual.  Tell your dentist and other health care providers that you are taking anticoagulants before you have any procedures in which bleeding may occur.  Avoid contact sports.  Wear a medical alert bracelet or carry a medical alert card that says you have had a PE.  Ask your health care provider how soon you can go back to your normal activities. Stay active to prevent new blood clots from forming.  Make sure to exercise while traveling or when you have been sitting or standing for a long period of time. It is very important to exercise. Exercise your legs by walking or by tightening and relaxing your leg muscles often. Take frequent walks.  Wear compression stockings as told by your health care provider to help prevent more blood clots from forming.  Do not use tobacco products, including cigarettes, chewing tobacco, and e-cigarettes. If you need help quitting, ask your health care provider.  Keep all follow-up appointments with your health care provider. This is important. PREVENTION Take these actions to decrease your risk of developing another DVT:  Exercise regularly. For at least 30 minutes every day, engage in:  Activity that involves moving your arms and legs.  Activity that encourages good blood flow through your body by increasing your heart rate.  Exercise your arms and legs every hour during long-distance travel (over 4 hours). Drink plenty of water and avoid drinking alcohol while traveling.  Avoid sitting or lying in bed for long periods of time without moving your legs.  Maintain a weight that is appropriate for your height.  Ask your health care provider what weight is healthy for you.  If you are a woman who is over 65 years of age, avoid unnecessary use of medicines that contain estrogen. These include birth control pills.  Do not smoke, especially if you take estrogen medicines. If you need help quitting, ask your health care provider. If you are hospitalized, prevention measures may include:  Early walking after surgery, as soon as your health care provider says that it is safe.  Receiving anticoagulants to prevent blood clots.If you cannot take anticoagulants, other options may be available, such as wearing compression stockings or using different types of devices. SEEK IMMEDIATE MEDICAL CARE IF:  You have new or increased pain, swelling, or redness in an arm or leg.  You have numbness or tingling in  an arm or leg.  You have shortness of breath while active or at rest.  You have chest pain.  You have a rapid or irregular heartbeat.  You feel light-headed or dizzy.  You cough up blood.  You notice blood in your vomit, bowel movement, or urine. These symptoms may represent a serious problem that is an emergency. Do not wait to see if the symptoms will go away. Get medical help right away. Call your local emergency services (911 in the [place] Do not drive yourself to the hospital.   This information is not intended to replace advice given to you by your health care provider. Make sure you discuss any questions you have with your health care provider.   Rivaroxaban (Xarelto) ED Discharge Instructions   Patient received a prescription for  Xarelto 15 & 20 mg - 51 tablet VTE STARTER PACK.   Patient understands only the FIRST 30 DAYS OF TREATMENT  will be provided by the starter pack.   Patient understands to contact primary care doctor or ED immediately if for any reason is unable to fill the starter pack prescription.  Patient must schedule a follow-up appointment with primary care doctor within  15 days of discharge in order to receive the maintenance prescription and clinical follow up.  Patient has received an education kit containing ([facility] Trial Offer Card, DVT/PE brochure, Dosing Diary, and Xarelto Medication Guide).   If not performed in the ED, patient will receive medication counseling by a [facility] pharmacist via phone follow-up within the next 72 hours. Pharmacist to review signs and symptoms of bleeding and proper use of this medication.   Call 911 or return immediately to the nearest ED if you develop bleeding (e.g. nose, gums, vomit, urine, bloody or dark stools), unusual bruising, head trauma (even if minor), severe headache, altered mental status, change in speech, weakness on one side of body, shortness of breath, swollen lips/tongue/face/neck, chest pain, or other concerns.    Information on my medicine - XARELTO (rivaroxaban)  This medication education was provided to me or my healthcare representative as part of my discharge instructions.   WHY WAS XARELTO PRESCRIBED FOR YOU?  Xarelto was prescribed to treat blood clots that may have been found in the veins of your legs (deep vein thrombosis) or in your lungs (pulmonary embolism) and to reduce the risk of them occurring again.   WHAT DO YOU NEED TO KNOW ABOUT XARELTO?  The starting dose is one 15 mg tablet taken TWICE daily with food for the FIRST 21 DAYS then on day 22 the dose is changed to one 20 mg tablet taken ONCE A DAY with your evening meal.   DO NOT stop taking Xarelto without talking to the health care provider who prescribed the medication. Refill your prescription for 20 mg tablets before you run out.  After discharge, you should have regular check-up appointments with your healthcare provider that is prescribing your Xarelto. In the future your dose may need to be changed if your kidney function changes by a significant amount.   WHAT DO YOU DO IF YOU MISS A DOSE?  If you are taking Xarelto  TWICE DAILY and you miss a dose, take it as soon as you remember. You may take two 15 mg tablets (total 30 mg) at the same time then resume your regularly scheduled 15 mg twice daily the next day.   If you are taking Xarelto ONCE DAILY and you miss a dose, take it as  soon as you remember on the same day then continue your regularly scheduled once daily regimen the next day. Do not take two doses of Xarelto at the same time.   IMPORTANT SAFETY INFORMATION  Xarelto is a blood thinner medicine that can cause bleeding. You should call your healthcare provider right away if you experience any of the following:  -  Bleeding from an injury or your nose that does not stop.  -  Unusual colored urine (red or dark brown) or unusual colored stools (red or black).  -  Unusual bruising for unknown reasons.  -  A serious fall or if you hit your head (even if there is no bleeding).   Some medicines may interact with Xarelto and might increase your risk of bleeding while on Xarelto. To help avoid this, consult your healthcare provider or pharmacist prior to using any new prescription or non-prescription medications, including herbals, vitamins, non-steroidal anti-inflammatory drugs (NSAIDs) and supplements.   This website has more information on Xarelto: [email or web address]

## 2016-01-20 NOTE — ED Notes (Signed)
Abnormal lab results shown to [facility]

## 2016-01-20 NOTE — ED Notes (Signed)
Vascular at bedside

## 2016-01-20 NOTE — ED Notes (Signed)
Pt awaiting pharmacist education on use of xarelto.

## 2016-01-20 NOTE — ED Provider Notes (Signed)
CSN: [id number]     Arrival date & time 01/20/16  [id number] History   First MD Initiated Contact with Patient 01/20/16 1036     Chief Complaint  Patient presents with  . thigh pain      (Consider location/radiation/quality/duration/timing/severity/associated sxs/prior Treatment) HPI    30 year old female presenting with complaints of left thigh pain. Patient report for the past 5 days she has had persistent left groin/left thigh pain. She described pain as a burning sensation with occasional sharp pain worsening with movement but sometimes keeping her up at night. Her pain is unrelieved despite taking ibuprofen around the clock. She is having difficulty Ambulating secondary to the pain. She denies any associated fever, chills, back pain, bowel bladder incontinence, saddle anesthesia, focal numbness or weakness. She was seen in the ED 2 days ago for similar complaint but at that time patient state  She had blood work done, pelvic examination as well as pelvic ultrasound without any definitive diagnosis. She was also treated for potential STDs. Her symptoms persist.  Patient is currently on oral birth control pill. She has no prior history of PE or DVT, recent surgery, prolonged bed rest, unilateral leg swelling or calf pain, or rash.  Past Medical History  Diagnosis Date  . Asthma    Past Surgical History  Procedure Laterality Date  . Induced abortion    . Cesarean section     No family history on file. Social History  Substance Use Topics  . Smoking status: Never Smoker   . Smokeless tobacco: None  . Alcohol Use: No   OB History    Gravida Para Term Preterm AB TAB SAB Ectopic Multiple Living   3 1   1 1    0 2     Review of Systems  All other systems reviewed and are negative.     Allergies  Review of patient's allergies indicates no known allergies.  Home Medications   Prior to Admission medications   Medication Sig Start Date End Date Taking? Authorizing Provider   ibuprofen (ADVIL,MOTRIN) 800 MG tablet Take 1 tablet (800 mg total) by mouth every 8 (eight) hours as needed. Patient taking differently: Take 800 mg by mouth every 8 (eight) hours as needed for moderate pain.  10/26/15  Yes Rachelle A Denney, CNM  Levonorgestrel-Ethinyl Estradiol (SEASONIQUE) 0.15-0.03 &0.01 MG tablet Take 1 tablet by mouth daily. 08/04/15  Yes Rachelle A Denney, CNM  fluconazole (DIFLUCAN) 100 MG tablet Take 1 tablet (100 mg total) by mouth once. Repeat dose in 48-72 hour. Patient not taking: Reported on 01/05/2016 11/23/15   Morene Crocker, CNM  terconazole (TERAZOL 7) 0.4 % vaginal cream Place 1 applicator vaginally at bedtime. Patient not taking: Reported on 01/05/2016 11/23/15   Ernst Bowler A Denney, CNM   BP 106/69 mmHg  Pulse 91  Temp(Src) 97.7 F (36.5 C) (Oral)  Resp 16  Ht 5\' 2"  (1.575 m)  Wt 55.792 kg  BMI 22.49 kg/m2  SpO2 100%  LMP 10/26/2015 Physical Exam  Constitutional: She appears well-developed and well-nourished. No distress.  HENT:  Head: Atraumatic.  Eyes: Conjunctivae are normal.  Neck: Neck supple.  Abdominal: Soft. She exhibits no distension. There is no tenderness.  Musculoskeletal: She exhibits tenderness ( left groin and left anterior medial thigh on palpation with obvious palpable cord. NO erythema or edema. L hip with normal hip flexion extension  abduction and abduction. No hernia noted, no lymphadenopathy.).  Neurological: She is alert.  Skin: No rash noted.  Psychiatric:  She has a normal mood and affect.  Nursing note and vitals reviewed.   ED Course  Procedures (including critical care time) Labs Review Labs Reviewed - No data to display  Imaging Review US Transvaginal Non-ob  01/18/2016  CLINICAL DATA:  Left lower quadrant pain.  Pain for 3 days. EXAM: TRANSABDOMINAL AND TRANSVAGINAL ULTRASOUND OF PELVIS DOPPLER ULTRASOUND OF OVARIES TECHNIQUE: Both transabdominal and transvaginal ultrasound examinations of the pelvis were  performed. Transabdominal technique was performed for global imaging of the pelvis including uterus, ovaries, adnexal regions, and pelvic cul-de-sac. It was necessary to proceed with endovaginal exam following the transabdominal exam to visualize the endometrium and ovaries. Color and duplex Doppler ultrasound was utilized to evaluate blood flow to the ovaries. COMPARISON:  None. FINDINGS: Uterus Measurements: 10.1 x 5.4 x 6 cm. Possible 3 x 1.8 x 1.6 cm hypoechoic right fundal uterine mass likely representing a sub serosal fibroid. Endometrium Thickness: 6.5 mm.  No focal abnormality visualized. Right ovary Measurements: 1.6 x 1.2 x 1.8 cm. Normal appearance/no adnexal mass. Left ovary Not visualize Pulsed Doppler evaluation of the right ovary demonstrates normal low-resistance arterial and venous waveforms. Other findings No abnormal free fluid. IMPRESSION: 1. Nonvisualized left ovary. 2. Normal right ovary.  No right ovarian torsion. Electronically Signed   By: Kathreen Devoid   On: 01/18/2016 19:55   US Pelvis Complete  01/18/2016  CLINICAL DATA:  Left lower quadrant pain.  Pain for 3 days. EXAM: TRANSABDOMINAL AND TRANSVAGINAL ULTRASOUND OF PELVIS DOPPLER ULTRASOUND OF OVARIES TECHNIQUE: Both transabdominal and transvaginal ultrasound examinations of the pelvis were performed. Transabdominal technique was performed for global imaging of the pelvis including uterus, ovaries, adnexal regions, and pelvic cul-de-sac. It was necessary to proceed with endovaginal exam following the transabdominal exam to visualize the endometrium and ovaries. Color and duplex Doppler ultrasound was utilized to evaluate blood flow to the ovaries. COMPARISON:  None. FINDINGS: Uterus Measurements: 10.1 x 5.4 x 6 cm. Possible 3 x 1.8 x 1.6 cm hypoechoic right fundal uterine mass likely representing a sub serosal fibroid. Endometrium Thickness: 6.5 mm.  No focal abnormality visualized. Right ovary Measurements: 1.6 x 1.2 x 1.8 cm. Normal  appearance/no adnexal mass. Left ovary Not visualize Pulsed Doppler evaluation of the right ovary demonstrates normal low-resistance arterial and venous waveforms. Other findings No abnormal free fluid. IMPRESSION: 1. Nonvisualized left ovary. 2. Normal right ovary.  No right ovarian torsion. Electronically Signed   By: Kathreen Devoid   On: 01/18/2016 19:55   [place] Art/ven Flow Abd Pelv Doppler  01/18/2016  CLINICAL DATA:  Left lower quadrant pain.  Pain for 3 days. EXAM: TRANSABDOMINAL AND TRANSVAGINAL ULTRASOUND OF PELVIS DOPPLER ULTRASOUND OF OVARIES TECHNIQUE: Both transabdominal and transvaginal ultrasound examinations of the pelvis were performed. Transabdominal technique was performed for global imaging of the pelvis including uterus, ovaries, adnexal regions, and pelvic cul-de-sac. It was necessary to proceed with endovaginal exam following the transabdominal exam to visualize the endometrium and ovaries. Color and duplex Doppler ultrasound was utilized to evaluate blood flow to the ovaries. COMPARISON:  None. FINDINGS: Uterus Measurements: 10.1 x 5.4 x 6 cm. Possible 3 x 1.8 x 1.6 cm hypoechoic right fundal uterine mass likely representing a sub serosal fibroid. Endometrium Thickness: 6.5 mm.  No focal abnormality visualized. Right ovary Measurements: 1.6 x 1.2 x 1.8 cm. Normal appearance/no adnexal mass. Left ovary Not visualize Pulsed Doppler evaluation of the right ovary demonstrates normal low-resistance arterial and venous waveforms. Other findings No abnormal free fluid. IMPRESSION:  1. Nonvisualized left ovary. 2. Normal right ovary.  No right ovarian torsion. Electronically Signed   By: Kathreen Devoid   On: 01/18/2016 19:55   I have personally reviewed and evaluated these images and lab results as part of my medical decision-making.   EKG Interpretation None      MDM   Final diagnoses:  Upper leg DVT (deep venous thromboembolism), acute, left (HCC)    BP 116/92 mmHg  Pulse 83  Temp(Src)  97.7 F (36.5 C) (Oral)  Resp 16  Ht 5\' 2"  (1.575 m)  Wt 55.792 kg  BMI 22.49 kg/m2  SpO2 99%  LMP 10/26/2015   11:32 AM  Patient presents with reproducible left thigh pain. She is on birth control pills which may increase her risk of DVT. I do not appreciate any signs of cellulitis on her thigh. She does not have any radicular pain on examination. Low suspicion for sciatica. She is neurovascularly intact.  palpable cord noted but no erythema or edema, a vascular Doppler study will be obtained to rule out DVT. Patient also requests a set of crutches and also requests x-ray of her left thigh. This likely to be a low yield test and I explained that to patient but she is persistent on having an x-ray. X-ray ordered.  DVT study is positive.  Pt is stable.  xarelto starter pack was prescribed.  Clear discharge instruction provided. Return precaution discussed.  VASCULAR LAB PRELIMINARY PRELIMINARY PRELIMINARY PRELIMINARY  Bilateral lower extremity venous duplex completed.   Preliminary report: Right: No evidence of DVT, superficial thrombosis, or Baker's cyst. Left: DVT noted in the CFV, FV, and popliteal v. Superficial thrombosis noted in the GSV to the mid/distal thigh. No Baker's cyst.  Cestone,Helene, RVT 01/20/2016, 3:48 PM  Domenic Moras, PA-C 01/21/16 [id number]  Merrily Pew, MD 01/21/16 [phone]

## 2016-01-20 NOTE — Progress Notes (Signed)
VASCULAR LAB PRELIMINARY  PRELIMINARY  PRELIMINARY  PRELIMINARY  Bilateral lower extremity venous duplex  completed.    Preliminary report:  Right:  No evidence of DVT, superficial thrombosis, or Baker's cyst.  Left: DVT noted in the CFV, FV, and popliteal v.  Superficial thrombosis noted in the GSV to the mid/distal thigh.  No Baker's cyst.  Deshun Sedivy, RVT 01/20/2016, 3:48 PM

## 2016-01-22 ENCOUNTER — Encounter (HOSPITAL_COMMUNITY): Payer: Self-pay | Admitting: Emergency Medicine

## 2016-01-22 ENCOUNTER — Emergency Department (HOSPITAL_COMMUNITY)
Admission: EM | Admit: 2016-01-22 | Discharge: 2016-01-22 | Disposition: A | Discharge status: Home / Self Care | Payer: Medicaid Other | Attending: Emergency Medicine | Admitting: Emergency Medicine

## 2016-01-22 DIAGNOSIS — I82409 Acute embolism and thrombosis of unspecified deep veins of unspecified lower extremity: Secondary | ICD-10-CM

## 2016-01-22 DIAGNOSIS — Z79899 Other long term (current) drug therapy: Secondary | ICD-10-CM | POA: Diagnosis not present

## 2016-01-22 DIAGNOSIS — J45909 Unspecified asthma, uncomplicated: Secondary | ICD-10-CM | POA: Diagnosis not present

## 2016-01-22 DIAGNOSIS — I82402 Acute embolism and thrombosis of unspecified deep veins of left lower extremity: Secondary | ICD-10-CM | POA: Diagnosis not present

## 2016-01-22 DIAGNOSIS — R1032 Left lower quadrant pain: Secondary | ICD-10-CM | POA: Insufficient documentation

## 2016-01-22 DIAGNOSIS — M7989 Other specified soft tissue disorders: Secondary | ICD-10-CM | POA: Diagnosis present

## 2016-01-22 DIAGNOSIS — Z7901 Long term (current) use of anticoagulants: Secondary | ICD-10-CM | POA: Diagnosis not present

## 2016-01-22 MED ORDER — TRAMADOL HCL 50 MG PO TABS: 50.0000 mg | ORAL_TABLET | Freq: Four times a day (QID) | ORAL | Status: DC | PRN

## 2016-01-22 NOTE — ED Notes (Signed)
Pt diagnosed with L groin DVT on 4/28. Was given rx for xarelto and oxycodone. Pt reports pain and swelling are unchanged.

## 2016-01-22 NOTE — Discharge Instructions (Signed)
Deep Vein Thrombosis °A deep vein thrombosis (DVT) is a blood clot (thrombus) that usually occurs in a deep, larger vein of the lower leg or the pelvis, or in an upper extremity such as the arm. These are dangerous and can lead to serious and even life-threatening complications if the clot travels to the lungs. °A DVT can damage the valves in your leg veins so that instead of flowing upward, the blood pools in the lower leg. This is called post-thrombotic syndrome, and it can result in pain, swelling, discoloration, and sores on the leg. °CAUSES °A DVT is caused by the formation of a blood clot in your leg, pelvis, or arm. Usually, several things contribute to the formation of blood clots. A clot may develop when: °· Your blood flow slows down. °· Your vein becomes damaged in some way. °· You have a condition that makes your blood clot more easily. °RISK FACTORS °A DVT is more likely to develop in: °· People who are older, especially over 60 years of age. °· People who are overweight (obese). °· People who sit or lie still for a long time, such as during long-distance travel (over 4 hours), bed rest, hospitalization, or during recovery from certain medical conditions like a stroke. °· People who do not engage in much physical activity (sedentary lifestyle). °· People who have chronic breathing disorders. °· People who have a personal or family history of blood clots or blood clotting disease. °· People who have peripheral vascular disease (PVD), diabetes, or some types of cancer. °· People who have heart disease, especially if the person had a recent heart attack or has congestive heart failure. °· People who have neurological diseases that affect the legs (leg paresis). °· People who have had a traumatic injury, such as breaking a hip or leg. °· People who have recently had major or lengthy surgery, especially on the hip, knee, or abdomen. °· People who have had a central line placed inside a large vein. °· People  who take medicines that contain the hormone estrogen. These include birth control pills and hormone replacement therapy. °· Pregnancy or during childbirth or the postpartum period. °· Long plane flights (over 8 hours). °SIGNS AND SYMPTOMS °Symptoms of a DVT can include:  °· Swelling of your leg or arm, especially if one side is much worse. °· Warmth and redness of your leg or arm, especially if one side is much worse. °· Pain in your arm or leg. If the clot is in your leg, symptoms may be more noticeable or worse when you stand or walk. °· A feeling of pins and needles, if the clot is in the arm. °The symptoms of a DVT that has traveled to the lungs (pulmonary embolism, PE) usually start suddenly and include: °· Shortness of breath while active or at rest. °· Coughing or coughing up blood or blood-tinged mucus. °· Chest pain that is often worse with deep breaths. °· Rapid or irregular heartbeat. °· Feeling light-headed or dizzy. °· Fainting. °· Feeling anxious. °· Sweating. °There may also be pain and swelling in a leg if that is where the blood clot started. °These symptoms may represent a serious problem that is an emergency. Do not wait to see if the symptoms will go away. Get medical help right away. Call your local emergency services (911 in the [place] Do not drive yourself to the hospital. °DIAGNOSIS °Your health care provider will take a medical history and perform a physical exam. You may also   have other tests, including: °· Blood tests to assess the clotting properties of your blood. °· Imaging tests, such as CT, ultrasound, MRI, X-ray, and other tests to see if you have clots anywhere in your body. °TREATMENT °After a DVT is identified, it can be treated. The type of treatment that you receive depends on many factors, such as the cause of your DVT, your risk for bleeding or developing more clots, and other medical conditions that you have. Sometimes, a combination of treatments is necessary. Treatment  options may be combined and include: °· Monitoring the blood clot with ultrasound. °· Taking medicines by mouth, such as newer blood thinners (anticoagulants), thrombolytics, or warfarin. °· Taking anticoagulant medicine by injection or through an IV tube. °· Wearing compression stockings or using different types of devices. °· Surgery (rare) to remove the blood clot or to place a filter in your abdomen to stop the blood clot from traveling to your lungs. °Treatments for a DVT are often divided into immediate treatment and long-term treatment (up to 3 months after DVT). You can work with your health care provider to choose the treatment program that is best for you. °HOME CARE INSTRUCTIONS °If you are taking a newer oral anticoagulant: °· Take the medicine every single day at the same time each day. °· Understand what foods and drugs interact with this medicine. °· Understand that there are no regular blood tests required when using this medicine. °· Understand the side effects of this medicine, including excessive bruising or bleeding. Ask your health care provider or pharmacist about other possible side effects. °If you are taking warfarin: °· Understand how to take warfarin and know which foods can affect how warfarin works in your body. °· Understand that it is dangerous to take too much or too little warfarin. Too much warfarin increases the risk of bleeding. Too little warfarin continues to allow the risk for blood clots. °· Follow your PT and INR blood testing schedule. The PT and INR results allow your health care provider to adjust your dose of warfarin. It is very important that you have your PT and INR tested as often as told by your health care provider. °· Avoid major changes in your diet, or tell your health care provider before you change your diet. Arrange a visit with a registered dietitian to answer your questions. Many foods, especially foods that are high in vitamin K, can interfere with warfarin  and affect the PT and INR results. Eat a consistent amount of foods that are high in vitamin K, such as: °¨ Spinach, kale, broccoli, cabbage, collard greens, turnip greens, Brussels sprouts, peas, cauliflower, seaweed, and parsley. °¨ Beef liver and pork liver. °¨ Green tea. °¨ Soybean oil. °· Tell your health care provider about any and all medicines, vitamins, and supplements that you take, including aspirin and other over-the-counter anti-inflammatory medicines. Be especially cautious with aspirin and anti-inflammatory medicines. Do not take those before you ask your health care provider if it is safe to do so. This is important because many medicines can interfere with warfarin and affect the PT and INR results. °· Do not start or stop taking any over-the-counter or prescription medicine unless your health care provider or pharmacist tells you to do so. °If you take warfarin, you will also need to do these things: °· Hold pressure over cuts for longer than usual. °· Tell your dentist and other health care providers that you are taking warfarin before you have any procedures in which   bleeding may occur. °· Avoid alcohol or drink very small amounts. Tell your health care provider if you change your alcohol intake. °· Do not use tobacco products, including cigarettes, chewing tobacco, and e-cigarettes. If you need help quitting, ask your health care provider. °· Avoid contact sports. °General Instructions °· Take over-the-counter and prescription medicines only as told by your health care provider. Anticoagulant medicines can have side effects, including easy bruising and difficulty stopping bleeding. If you are prescribed an anticoagulant, you will also need to do these things: °¨ Hold pressure over cuts for longer than usual. °¨ Tell your dentist and other health care providers that you are taking anticoagulants before you have any procedures in which bleeding may occur. °¨ Avoid contact sports. °· Wear a medical  alert bracelet or carry a medical alert card that says you have had a PE. °· Ask your health care provider how soon you can go back to your normal activities. Stay active to prevent new blood clots from forming. °· Make sure to exercise while traveling or when you have been sitting or standing for a long period of time. It is very important to exercise. Exercise your legs by walking or by tightening and relaxing your leg muscles often. Take frequent walks. °· Wear compression stockings as told by your health care provider to help prevent more blood clots from forming. °· Do not use tobacco products, including cigarettes, chewing tobacco, and e-cigarettes. If you need help quitting, ask your health care provider. °· Keep all follow-up appointments with your health care provider. This is important. °PREVENTION °Take these actions to decrease your risk of developing another DVT: °· Exercise regularly. For at least 30 minutes every day, engage in: °¨ Activity that involves moving your arms and legs. °¨ Activity that encourages good blood flow through your body by increasing your heart rate. °· Exercise your arms and legs every hour during long-distance travel (over 4 hours). Drink plenty of water and avoid drinking alcohol while traveling. °· Avoid sitting or lying in bed for long periods of time without moving your legs. °· Maintain a weight that is appropriate for your height. Ask your health care provider what weight is healthy for you. °· If you are a woman who is over 35 years of age, avoid unnecessary use of medicines that contain estrogen. These include birth control pills. °· Do not smoke, especially if you take estrogen medicines. If you need help quitting, ask your health care provider. °If you are hospitalized, prevention measures may include: °· Early walking after surgery, as soon as your health care provider says that it is safe. °· Receiving anticoagulants to prevent blood clots. If you cannot take  anticoagulants, other options may be available, such as wearing compression stockings or using different types of devices. °SEEK IMMEDIATE MEDICAL CARE IF: °· You have new or increased pain, swelling, or redness in an arm or leg. °· You have numbness or tingling in an arm or leg. °· You have shortness of breath while active or at rest. °· You have chest pain. °· You have a rapid or irregular heartbeat. °· You feel light-headed or dizzy. °· You cough up blood. °· You notice blood in your vomit, bowel movement, or urine. °These symptoms may represent a serious problem that is an emergency. Do not wait to see if the symptoms will go away. Get medical help right away. Call your local emergency services (911 in the [place] Do not drive yourself to the hospital. °  °  This information is not intended to replace advice given to you by your health care provider. Make sure you discuss any questions you have with your health care provider. °  °Document Released: 09/10/2005 Document Revised: 06/01/2015 Document Reviewed: 01/05/2015 °[employer] Interactive Patient Education ©2016 [employer] ° °

## 2016-01-22 NOTE — ED Provider Notes (Signed)
CSN: [id number]     Arrival date & time 01/22/16  1005 History   First MD Initiated Contact with Patient 01/22/16 1057     Chief Complaint  Patient presents with  . Leg Swelling   HPI Patient was recently seen in the emergency room on April 28 after having had 5 days of pain on the left groin area and left thigh. She had a Doppler ultrasound which showed a deep venous thrombosis on the left side. She was discharged home with a prescription for hydrocodone and Xarelto. The patient presented back to the emergency room today because she feels no better. Her thigh continues to hurt and she scheduled to go back to work tomorrow and she does not feel like she can do that. She denies any fever. She denies any chest pain or shortness of breath patient.  She had been taking birth control pills and she has stopped taking  those.  She is being compliant with her Xarelto Past Medical History  Diagnosis Date  . Asthma   . DVT (deep venous thrombosis) [facility])    Past Surgical History  Procedure Laterality Date  . Induced abortion    . Cesarean section     History reviewed. No pertinent family history. Social History  Substance Use Topics  . Smoking status: Never Smoker   . Smokeless tobacco: None  . Alcohol Use: No   OB History    Gravida Para Term Preterm AB TAB SAB Ectopic Multiple Living   3 1   1 1    0 2     Review of Systems  All other systems reviewed and are negative.     Allergies  Review of patient's allergies indicates no known allergies.  Home Medications   Prior to Admission medications   Medication Sig Start Date End Date Taking? Authorizing Provider  Rivaroxaban 15 & 20 MG TBPK Take as directed on package: Start with one 15mg  tablet by mouth twice a day with food. On Day 22, switch to one 20mg  tablet once a day with food. 01/20/16  Yes Domenic Moras, PA-C  fluconazole (DIFLUCAN) 100 MG tablet Take 1 tablet (100 mg total) by mouth once. Repeat dose in 48-72 hour. Patient not  taking: Reported on 01/05/2016 11/23/15   Morene Crocker, CNM  terconazole (TERAZOL 7) 0.4 % vaginal cream Place 1 applicator vaginally at bedtime. Patient not taking: Reported on 01/05/2016 11/23/15   Morene Crocker, CNM  traMADol (ULTRAM) 50 MG tablet Take 1 tablet (50 mg total) by mouth every 6 (six) hours as needed. 01/22/16   Dorie Rank, MD   BP 111/92 mmHg  Pulse 93  Temp(Src) 98.3 F (36.8 C) (Oral)  Resp 16  SpO2 95%  LMP 10/26/2015 Physical Exam  Constitutional: She appears well-developed and well-nourished. No distress.  HENT:  Head: Normocephalic and atraumatic.  Right Ear: External ear normal.  Left Ear: External ear normal.  Eyes: Conjunctivae are normal. Right eye exhibits no discharge. Left eye exhibits no discharge. No scleral icterus.  Neck: Neck supple. No tracheal deviation present.  Cardiovascular: Normal rate, regular rhythm and intact distal pulses.   Pulmonary/Chest: Effort normal and breath sounds normal. No stridor. No respiratory distress. She has no wheezes. She has no rales.  Abdominal: Soft. Bowel sounds are normal. She exhibits no distension. There is no tenderness. There is no rebound and no guarding.  Musculoskeletal: She exhibits tenderness. She exhibits no edema.  No significant edema in her left ankle or left calf,  she does have tenderness to palpation in the left medial thigh  Neurological: She is alert. She has normal strength. No cranial nerve deficit (no facial droop, extraocular movements intact, no slurred speech) or sensory deficit. She exhibits normal muscle tone. She displays no seizure activity. Coordination normal.  Skin: Skin is warm and dry. No rash noted.  Psychiatric: She has a normal mood and affect.  Nursing note and vitals reviewed.   ED Course  Procedures (including critical care time)    MDM   Final diagnoses:  DVT (deep venous thrombosis), unspecified laterality    I explained to the patient that it's not uncommon that  she would not feel better just 2 days after starting treatment for her deep venous thrombosis. She is not having any symptoms that are concerning for pulmonary embolism.  We can try Ultram and Tylenol as opposed to her hydrocodone. I explained her that it will take several weeks for her symptoms to improve. We can give her a work note and she should consider following up with her primary doctor or discuss  with her job if they have an occupational health provider to discuss modified duty as she continues treatment for her venous thrombosis.    Dorie Rank, MD 01/22/16 [phone]

## 2016-01-24 ENCOUNTER — Encounter: Payer: Self-pay | Admitting: Certified Nurse Midwife

## 2016-01-24 ENCOUNTER — Ambulatory Visit (INDEPENDENT_AMBULATORY_CARE_PROVIDER_SITE_OTHER): Payer: Medicaid Other | Admitting: Certified Nurse Midwife

## 2016-01-24 VITALS — BP 117/82 | HR 103 | Wt 119.0 lb

## 2016-01-24 DIAGNOSIS — I82402 Acute embolism and thrombosis of unspecified deep veins of left lower extremity: Secondary | ICD-10-CM

## 2016-01-24 DIAGNOSIS — I82492 Acute embolism and thrombosis of other specified deep vein of left lower extremity: Secondary | ICD-10-CM | POA: Diagnosis not present

## 2016-01-24 DIAGNOSIS — I82409 Acute embolism and thrombosis of unspecified deep veins of unspecified lower extremity: Secondary | ICD-10-CM | POA: Insufficient documentation

## 2016-01-24 MED ORDER — HYDROCODONE-ACETAMINOPHEN 5-300 MG PO TABS: 1.0000 | ORAL_TABLET | Freq: Four times a day (QID) | ORAL | Status: DC | PRN

## 2016-01-24 NOTE — Progress Notes (Signed)
Patient ID: Paula Warner, female   DOB: 07/07/1986, 30 y.o.   MRN: [id number]   Chief Complaint  Patient presents with  . Follow-up    post DVT, hospital visit from 4/28-30    HPI Paula Warner is a 30 y.o. female.  Here for f/u from ED was dx with left lower extremity DVT 01/22/16.  Does not have a PCP provider.  Was told to f/u with PCP.  Is having lots of LLE pain, is low on Vicodin.  Desires refill.  Also, needs work note.  Work note given for one week until she can get to a PCP provider.  PCP provider referral completed.  OCPs stopped.  Discussed f/u after she is completed with DVT to discuss contraception/annual exam.  Patient verbalized understanding.  States that she is taking her Xarelto.     HPI  Past Medical History  Diagnosis Date  . Asthma   . DVT (deep venous thrombosis) [facility])     Past Surgical History  Procedure Laterality Date  . Induced abortion    . Cesarean section      No family history on file.  Social History Social History  Substance Use Topics  . Smoking status: Never Smoker   . Smokeless tobacco: Not on file  . Alcohol Use: No    No Known Allergies  Current Outpatient Prescriptions  Medication Sig Dispense Refill  . Rivaroxaban 15 & 20 MG TBPK Take as directed on package: Start with one 15mg  tablet by mouth twice a day with food. On Day 22, switch to one 20mg  tablet once a day with food. 51 each 0  . fluconazole (DIFLUCAN) 100 MG tablet Take 1 tablet (100 mg total) by mouth once. Repeat dose in 48-72 hour. (Patient not taking: Reported on 01/05/2016) 3 tablet 0  . Hydrocodone-Acetaminophen 5-300 MG TABS Take 1-2 tablets by mouth every 6 (six) hours as needed (Severe pain). 40 each 0  . terconazole (TERAZOL 7) 0.4 % vaginal cream Place 1 applicator vaginally at bedtime. (Patient not taking: Reported on 01/05/2016) 45 g 0  . traMADol (ULTRAM) 50 MG tablet Take 1 tablet (50 mg total) by mouth every 6 (six) hours as needed. 20 tablet 0  . [DISCONTINUED]  Levonorgestrel-Ethinyl Estradiol (SEASONIQUE) 0.15-0.03 &0.01 MG tablet Take 1 tablet by mouth daily. 1 Package 4   No current facility-administered medications for this visit.    Review of Systems Review of Systems Constitutional: negative for fatigue and weight loss Respiratory: negative for cough and wheezing Cardiovascular: negative for chest pain, fatigue and palpitations Gastrointestinal: negative for abdominal pain and change in bowel habits Genitourinary:negative Integument/breast: negative for nipple discharge Musculoskeletal:negative for myalgias, +LLE DVT Neurological: negative for gait problems and tremors Behavioral/Psych: negative for abusive relationship, depression Endocrine: negative for temperature intolerance     Blood pressure 117/82, pulse 103, weight 119 lb (53.978 kg), last menstrual period 01/24/2016.  Physical Exam Physical Exam General:   alert  Skin:   no rash or abnormalities    95% of 15 min visit spent on counseling and coordination of care.   Data Reviewed Previous medical hx, meds, labs  Assessment     LLE DVT     Plan    Orders Placed This Encounter  Procedures  . Ambulatory referral to Internal Medicine    Referral Priority:  Urgent    Referral Type:  Consultation    Referral Reason:  Specialty Services Required    Requested Specialty:  Internal Medicine    Number  of Visits Requested:  1   Meds ordered this encounter  Medications  . DISCONTD: HYDROcodone-acetaminophen (NORCO/VICODIN) 5-325 MG tablet    Sig: Take 1 tablet by mouth every 6 (six) hours as needed for moderate pain.  Marland Kitchen Hydrocodone-Acetaminophen 5-300 MG TABS    Sig: Take 1-2 tablets by mouth every 6 (six) hours as needed (Severe pain).    Dispense:  40 each    Refill:  0     Follow up after stabilized for contraception possible paraguard IUD, and annual exam.

## 2016-02-22 ENCOUNTER — Telehealth: Payer: Self-pay | Admitting: *Deleted

## 2016-02-22 DIAGNOSIS — N760 Acute vaginitis: Secondary | ICD-10-CM

## 2016-02-22 MED ORDER — FLUCONAZOLE 100 MG PO TABS: 100.0000 mg | ORAL_TABLET | Freq: Once | ORAL | Status: DC

## 2016-02-22 NOTE — Telephone Encounter (Signed)
Patient request call. 2:05 Call to patient- she has symptoms of yeast infection and would like treatment if possible. Patient was given an appointment for next week. Told patient we could send something in- if her symptoms improve she can cancel the appointment- if not she can come in to get checked.

## 2016-02-29 ENCOUNTER — Ambulatory Visit (INDEPENDENT_AMBULATORY_CARE_PROVIDER_SITE_OTHER): Payer: Medicaid Other | Admitting: Certified Nurse Midwife

## 2016-02-29 VITALS — BP 113/75 | HR 69 | Wt 122.0 lb

## 2016-02-29 DIAGNOSIS — N926 Irregular menstruation, unspecified: Secondary | ICD-10-CM | POA: Diagnosis not present

## 2016-02-29 DIAGNOSIS — Z3202 Encounter for pregnancy test, result negative: Secondary | ICD-10-CM

## 2016-02-29 DIAGNOSIS — R309 Painful micturition, unspecified: Secondary | ICD-10-CM | POA: Diagnosis not present

## 2016-02-29 DIAGNOSIS — N760 Acute vaginitis: Secondary | ICD-10-CM | POA: Diagnosis not present

## 2016-02-29 DIAGNOSIS — Z7251 High risk heterosexual behavior: Secondary | ICD-10-CM | POA: Diagnosis not present

## 2016-02-29 LAB — POCT URINALYSIS DIPSTICK
Bilirubin, UA: NEGATIVE
Blood, UA: 50
Glucose, UA: NEGATIVE
Ketones, UA: NEGATIVE
LEUKOCYTES UA: NEGATIVE
NITRITE UA: NEGATIVE
PH UA: 5
PROTEIN UA: NEGATIVE
Spec Grav, UA: 1.025
UROBILINOGEN UA: NEGATIVE

## 2016-02-29 LAB — POCT URINE PREGNANCY: PREG TEST UR: NEGATIVE

## 2016-02-29 NOTE — Progress Notes (Signed)
Patient ID: Paula Warner, female   DOB: [birth year], 30 y.o.   MRN: [id number]   Chief Complaint  Patient presents with  . Vaginitis    was treated for yeast on 5-31. pt staets no better. vaginal itch/burning    HPI Paula Warner is a 30 y.o. female.  Currently on Xarelto for recent DVT.  States had 2 periods in May, with the second one around mid may with lighter bleeding for around 3 days.  Is currently sexually active not using birth control.  Desires Nexplanon.  Discussed waiting until it has been 3 months since DVT.  Discussed non-hormonal option of Paraguard IUD, patient declines.  States that she has been having vaginal itching for about 1 week and that it is worse with sexual intercourse.  Was seen by hematology on May 26th, is not due to return for 3 months.     HPI  Past Medical History  Diagnosis Date  . Asthma   . DVT (deep venous thrombosis) [facility])     Past Surgical History  Procedure Laterality Date  . Induced abortion    . Cesarean section      No family history on file.  Social History Social History  Substance Use Topics  . Smoking status: Never Smoker   . Smokeless tobacco: Not on file  . Alcohol Use: No    No Known Allergies  Current Outpatient Prescriptions  Medication Sig Dispense Refill  . Rivaroxaban 15 & 20 MG TBPK Take as directed on package: Start with one 15mg  tablet by mouth twice a day with food. On Day 22, switch to one 20mg  tablet once a day with food. 51 each 0  . Hydrocodone-Acetaminophen 5-300 MG TABS Take 1-2 tablets by mouth every 6 (six) hours as needed (Severe pain). (Patient not taking: Reported on 02/29/2016) 40 each 0  . traMADol (ULTRAM) 50 MG tablet Take 1 tablet (50 mg total) by mouth every 6 (six) hours as needed. (Patient not taking: Reported on 02/29/2016) 20 tablet 0  . [DISCONTINUED] Levonorgestrel-Ethinyl Estradiol (SEASONIQUE) 0.15-0.03 &0.01 MG tablet Take 1 tablet by mouth daily. 1 Package 4   No current  facility-administered medications for this visit.    Review of Systems Review of Systems Constitutional: negative for fatigue and weight loss Respiratory: negative for cough and wheezing Cardiovascular: negative for chest pain, fatigue and palpitations Gastrointestinal: negative for abdominal pain and change in bowel habits Genitourinary: + vaginal discharge, itching, 2 periods in May Integument/breast: negative for nipple discharge Musculoskeletal:negative for myalgias Neurological: negative for gait problems and tremors Behavioral/Psych: negative for abusive relationship, depression Endocrine: negative for temperature intolerance     Blood pressure 113/75, pulse 69, weight 122 lb (55.339 kg), last menstrual period 01/23/2016.  Physical Exam Physical Exam General:   alert  Skin:   no rash or abnormalities  Lungs:   clear to auscultation bilaterally  Heart:   regular rate and rhythm, S1, S2 normal, no murmur, click, rub or gallop  Breasts:   deferred  Abdomen:  normal findings: no organomegaly, soft, non-tender and no hernia  Pelvis:  External genitalia: normal general appearance Urinary system: urethral meatus normal and bladder without fullness, nontender Vaginal: normal without tenderness, induration or masses     50% of 15 min visit spent on counseling and coordination of care.   Data Reviewed Previous medical hx, meds  Assessment     H/O recent DVT High risk sexual behaviors  Hematuria    Plan   Order Nexplanon.  Plan  placement in Mid July  Encouraged safe sex practices and condom use  Orders Placed This Encounter  Procedures  . Urine culture  . Beta HCG, Quant  . POCT urine pregnancy  . POCT urinalysis dipstick   No orders of the defined types were placed in this encounter.    Possible management options include: Paraguard IUD, condoms Follow up as needed or with Nexplanon insertion in 1 month, pending negative pregnancy testing.

## 2016-03-01 LAB — URINE CULTURE: Organism ID, Bacteria: NO GROWTH

## 2016-03-01 LAB — BETA HCG QUANT (REF LAB)

## 2016-03-03 ENCOUNTER — Encounter: Payer: Self-pay | Admitting: Certified Nurse Midwife

## 2016-03-05 LAB — NUSWAB VG+, CANDIDA 6SP
CANDIDA LUSITANIAE, NAA: NEGATIVE
CANDIDA PARAPSILOSIS, NAA: NEGATIVE
Candida albicans, NAA: NEGATIVE
Candida glabrata, NAA: NEGATIVE
Candida krusei, NAA: NEGATIVE
Candida tropicalis, NAA: NEGATIVE
Chlamydia trachomatis, NAA: NEGATIVE
Neisseria gonorrhoeae, NAA: NEGATIVE
Trich vag by NAA: NEGATIVE

## 2016-04-04 ENCOUNTER — Ambulatory Visit (INDEPENDENT_AMBULATORY_CARE_PROVIDER_SITE_OTHER): Payer: Medicaid Other | Admitting: Certified Nurse Midwife

## 2016-04-04 VITALS — BP 103/72 | HR 54 | Temp 98.4°F | Wt 123.6 lb

## 2016-04-04 DIAGNOSIS — Z3202 Encounter for pregnancy test, result negative: Secondary | ICD-10-CM

## 2016-04-04 DIAGNOSIS — N926 Irregular menstruation, unspecified: Secondary | ICD-10-CM

## 2016-04-04 DIAGNOSIS — Z01812 Encounter for preprocedural laboratory examination: Secondary | ICD-10-CM

## 2016-04-04 LAB — POCT URINE PREGNANCY: PREG TEST UR: NEGATIVE

## 2016-04-04 NOTE — Progress Notes (Signed)
Pt states that she has not had a cycle for this month. LMP 03-02-16. Pt is on no other birth control.  Pt states that last had intercourse on 04-03-16.  Per R. Denney, CNM pt is to have Qualitative HcG drawn today, return to office in 2 weeks for UPT and Nexplanon Insertion. Pt was advised to abstain from intercourse for 2 weeks and return to office in 2 weeks. Pt states understanding.

## 2016-04-05 ENCOUNTER — Encounter: Payer: Self-pay | Admitting: Certified Nurse Midwife

## 2016-04-05 LAB — HCG, SERUM, QUALITATIVE: hCG,Beta Subunit,Qual,Serum: NEGATIVE m[IU]/mL (ref <6)

## 2016-04-09 ENCOUNTER — Telehealth: Payer: Self-pay | Admitting: *Deleted

## 2016-04-09 NOTE — Telephone Encounter (Signed)
Patient requested call back. 1649 Attempted to call patient back- VM- LM labs are negative please call back if needs.

## 2016-04-11 ENCOUNTER — Telehealth: Payer: Self-pay | Admitting: *Deleted

## 2016-04-11 NOTE — Telephone Encounter (Signed)
See telephone note for this encounter.

## 2016-04-18 ENCOUNTER — Encounter: Payer: Self-pay | Admitting: Certified Nurse Midwife

## 2016-04-18 ENCOUNTER — Ambulatory Visit (INDEPENDENT_AMBULATORY_CARE_PROVIDER_SITE_OTHER): Payer: Medicaid Other | Admitting: Certified Nurse Midwife

## 2016-04-18 VITALS — BP 106/76 | HR 66 | Temp 98.6°F | Wt 127.1 lb

## 2016-04-18 DIAGNOSIS — Z3202 Encounter for pregnancy test, result negative: Secondary | ICD-10-CM

## 2016-04-18 DIAGNOSIS — Z01818 Encounter for other preprocedural examination: Secondary | ICD-10-CM

## 2016-04-18 DIAGNOSIS — Z3049 Encounter for surveillance of other contraceptives: Secondary | ICD-10-CM

## 2016-04-18 DIAGNOSIS — Z30017 Encounter for initial prescription of implantable subdermal contraceptive: Secondary | ICD-10-CM

## 2016-04-18 LAB — POCT URINE PREGNANCY: PREG TEST UR: NEGATIVE

## 2016-04-18 NOTE — Progress Notes (Signed)
Nexplanon Procedure Note   PRE-OP DIAGNOSIS: desired long-term, reversible contraception, recent DVT, has completed treatment  POST-OP DIAGNOSIS: Same  PROCEDURE: Nexplanon  placement Performing Provider: Kandis Cocking CNM   Patient education prior to procedure, explained risk, benefits of Nexplanon, reviewed alternative options. Patient reported understanding. Gave consent to continue with procedure.   PROCEDURE:  Pregnancy Text :  Negative Site (check):      left arm         Sterile Preparation:   Betadinex3 Lot # [id number]  [id number] Expiration Date 04/2018  Insertion site was selected 8 - 10 cm from medial epicondyle and marked along with guiding site using sterile marker. Procedure area was prepped and draped in a sterile fashion. 1% Lidocaine 1.5 ml given prior to procedure. Nexplanon  was inserted subcutaneously.Needle was removed from the insertion site. Nexplanon capsule was palpated by provider and patient to assure satisfactory placement. And a bandage applied and the arm was wrapped with gauze bandage.     Followup: The patient tolerated the procedure well without complications.  Instructions:  The patient was instructed to remove the dressing in 24 hours and that some bruising is to be expected.  She was advised to use over the counter analgesics as needed for any pain at the site.  She is to keep the area dry for 24 hours and to call if her hand or arm becomes cold, numb, or blue.   Kandis Cocking CNM

## 2016-05-23 ENCOUNTER — Ambulatory Visit: Payer: Medicaid Other | Admitting: Certified Nurse Midwife

## 2016-06-07 ENCOUNTER — Ambulatory Visit: Payer: Medicaid Other | Admitting: Obstetrics & Gynecology

## 2016-06-24 DIAGNOSIS — Z86718 Personal history of other venous thrombosis and embolism: Secondary | ICD-10-CM

## 2016-06-24 HISTORY — DX: Personal history of other venous thrombosis and embolism: Z86.718

## 2016-07-08 ENCOUNTER — Emergency Department (HOSPITAL_COMMUNITY)
Admission: EM | Admit: 2016-07-08 | Discharge: 2016-07-08 | Disposition: A | Discharge status: Home / Self Care | Payer: Medicaid Other | Attending: Emergency Medicine | Admitting: Emergency Medicine

## 2016-07-08 ENCOUNTER — Emergency Department (HOSPITAL_BASED_OUTPATIENT_CLINIC_OR_DEPARTMENT_OTHER)
Admit: 2016-07-08 | Discharge: 2016-07-08 | Disposition: A | Discharge status: Home / Self Care | Payer: Medicaid Other | Attending: Student | Admitting: Student

## 2016-07-08 ENCOUNTER — Encounter (HOSPITAL_COMMUNITY): Payer: Self-pay | Admitting: Emergency Medicine

## 2016-07-08 DIAGNOSIS — J45909 Unspecified asthma, uncomplicated: Secondary | ICD-10-CM | POA: Diagnosis not present

## 2016-07-08 DIAGNOSIS — I82412 Acute embolism and thrombosis of left femoral vein: Secondary | ICD-10-CM | POA: Diagnosis not present

## 2016-07-08 DIAGNOSIS — M79609 Pain in unspecified limb: Secondary | ICD-10-CM

## 2016-07-08 DIAGNOSIS — Z7901 Long term (current) use of anticoagulants: Secondary | ICD-10-CM | POA: Diagnosis not present

## 2016-07-08 DIAGNOSIS — R1032 Left lower quadrant pain: Secondary | ICD-10-CM | POA: Diagnosis present

## 2016-07-08 DIAGNOSIS — I82512 Chronic embolism and thrombosis of left femoral vein: Secondary | ICD-10-CM

## 2016-07-08 LAB — I-STAT CHEM 8, ED
BUN: 10 mg/dL (ref 6–20)
CALCIUM ION: 1.28 mmol/L (ref 1.15–1.40)
Chloride: 102 mmol/L (ref 101–111)
Creatinine, Ser: 0.6 mg/dL (ref 0.44–1.00)
Glucose, Bld: 97 mg/dL (ref 65–99)
HCT: 42 % (ref 36.0–46.0)
HEMOGLOBIN: 14.3 g/dL (ref 12.0–15.0)
Potassium: 3.7 mmol/L (ref 3.5–5.1)
SODIUM: 145 mmol/L (ref 135–145)
TCO2: 29 mmol/L (ref 0–100)

## 2016-07-08 LAB — I-STAT BETA HCG BLOOD, ED (MC, WL, AP ONLY)

## 2016-07-08 MED ORDER — ENOXAPARIN SODIUM 100 MG/ML ~~LOC~~ SOLN: 100.0000 mg | SUBCUTANEOUS | Status: DC

## 2016-07-08 MED ORDER — ENOXAPARIN SODIUM 100 MG/ML ~~LOC~~ SOLN: 60.0000 mg | Freq: Two times a day (BID) | SUBCUTANEOUS | 0 refills | Status: DC

## 2016-07-08 MED ORDER — ENOXAPARIN SODIUM 100 MG/ML ~~LOC~~ SOLN: 100.0000 mg | SUBCUTANEOUS | 0 refills | Status: DC

## 2016-07-08 MED ORDER — ENOXAPARIN SODIUM 60 MG/0.6ML ~~LOC~~ SOLN
1.0000 mg/kg | Freq: Two times a day (BID) | SUBCUTANEOUS | Status: DC
  Administered 2016-07-08: 60 mg via SUBCUTANEOUS
  Filled 2016-07-08: qty 0.6

## 2016-07-08 NOTE — Progress Notes (Signed)
**  Preliminary report by tech**  There is evidence of acute deep vein thrombosis involving the left common femoral, and femoral vein. Thrombosis of the femoral vein extends from the proximal into the mid segment. There is no evidence of superficial vein thrombosis involving the left lower extremity. There is no evidence of deep or superficial vein thrombosis involving the right lower extremity. There is no evidence of a Baker's cyst bilaterally. Results given to Ocie Cornfield.  07/08/16 3:21 PM Carlos Levering RVT

## 2016-07-08 NOTE — ED Triage Notes (Signed)
patient states back in May was diagnosed with DVT in left groin and was put on blood thinners.  Patient states for the past week been having pain in left groin again with ambulation and palpation.

## 2016-07-08 NOTE — Discharge Instructions (Signed)
Please use the Lovenox injection as prescribed. You will do 60 mg injections twice a day for 2 days. Then you'll do 100 mg injections once a day for 1 month. You need to follow up with your primary care doctor tomorrow for further management. Please return to the ED if you develops any chest pains or shortness of breath.

## 2016-07-08 NOTE — ED Notes (Signed)
US at bedside

## 2016-07-08 NOTE — ED Provider Notes (Signed)
[facility] DEPT Provider Note   CSN: [id number] Arrival date & time: 07/08/16  1007     History   Chief Complaint Chief Complaint  Patient presents with  . Groin Pain    HPI Paula Warner is a 30 y.o. female.  30 year old African-American female past medical history significant for DVT presents to the ED today with pain in left groin. Patient states that she was diagnosed with a DVT in her left groin and March of this year. Patient was started on Xarelto. patient states that she is supposed to stop her medication November 1. She states that she has been doing well up until one week ago. Last week she noticed pain in her left groin returned close the same spot as prior. Patient states that the pain is worse with ambulation and palpation. She has not tried anything for the pain. Nothing makes the pain better. States that it is intermittent. She denies any chest pain or shortness of breath. She denies any fever, chills, headache, vision changes, lightheadedness, dizziness, chest pain, shortness of breath, abdominal pain, nausea, emesis, urinary symptoms, change in bowel habits, numbness/tinlgling, unilateral leg swelling. Patient states prior to her first DVT she was on oral contraceptives. However she stopped the oral birth control and March and started explanon. She denies any tobacco use.      Past Medical History:  Diagnosis Date  . Asthma   . DVT (deep venous thrombosis) [facility])     Patient Active Problem List   Diagnosis Date Noted  . Nexplanon insertion 04/18/2016  . DVT of lower extremity (deep venous thrombosis) ([place]) 01/24/2016  . High risk sexual behavior 08/04/2015    Past Surgical History:  Procedure Laterality Date  . CESAREAN SECTION    . INDUCED ABORTION      OB History    Gravida Para Term Preterm AB Living   3 1     1 2    SAB TAB Ectopic Multiple Live Births     1   0 2       Home Medications    Prior to Admission medications   Medication Sig  Start Date End Date Taking? Authorizing Provider  rivaroxaban (XARELTO) 10 MG TABS tablet Take 20 mg by mouth daily.     Historical Provider, MD    Family History No family history on file.  Social History Social History  Substance Use Topics  . Smoking status: Never Smoker  . Smokeless tobacco: Never Used  . Alcohol use No     Allergies   Review of patient's allergies indicates no known allergies.   Review of Systems Review of Systems  Constitutional: Negative for chills and fever.  HENT: Negative for ear pain and sore throat.   Eyes: Negative for pain and visual disturbance.  Respiratory: Negative for cough and shortness of breath.   Cardiovascular: Negative for chest pain, palpitations and leg swelling.  Gastrointestinal: Negative for abdominal pain, nausea and vomiting.  Genitourinary: Negative for dysuria and hematuria.  Musculoskeletal: Positive for myalgias. Negative for arthralgias and back pain.  Skin: Negative for color change and rash.  Neurological: Negative for dizziness, syncope, weakness, light-headedness and headaches.  All other systems reviewed and are negative.    Physical Exam Updated Vital Signs BP 127/92   Pulse 77   Temp 98.1 F (36.7 C) (Oral)   Resp 14   Ht 5\' 2"  (1.575 m)   Wt 61.2 kg   SpO2 100%   BMI 24.69 kg/m   Physical  Exam  Constitutional: She appears well-developed and well-nourished. No distress.  HENT:  Head: Normocephalic and atraumatic.  Mouth/Throat: Oropharynx is clear and moist.  Eyes: Conjunctivae are normal. Right eye exhibits no discharge. Left eye exhibits no discharge. No scleral icterus.  Neck: Normal range of motion. Neck supple. No thyromegaly present.  Cardiovascular: Normal rate, regular rhythm, normal heart sounds and intact distal pulses.  Exam reveals no gallop and no friction rub.   No murmur heard. Pulmonary/Chest: Effort normal and breath sounds normal. No respiratory distress. She has no wheezes.    Abdominal: Soft. Bowel sounds are normal. She exhibits no distension. There is no tenderness. There is no rebound and no guarding.  Musculoskeletal: Normal range of motion.  Patient with pain to left groin that is tender to palpation. No ecchymosis or erythema noted. No edema noted. No lump or cyst appreciated. DP pulses are 2+ bilaterally. Sensation intact. Cap refill is normal. Patient is able to ambulate with minimal pain.  Lymphadenopathy:    She has no cervical adenopathy.  Neurological: She is alert.  Skin: Skin is warm and dry. Capillary refill takes less than 2 seconds.  Nursing note and vitals reviewed.    ED Treatments / Results  Labs (all labs ordered are listed, but only abnormal results are displayed) Labs Reviewed  I-STAT CHEM 8, ED  I-STAT BETA HCG BLOOD, ED ([facility], [facility], [facility] ONLY)    EKG  EKG Interpretation None       Radiology No results found.  Procedures Procedures (including critical care time)  Medications Ordered in ED Medications - No data to display   Initial Impression / Assessment and Plan / ED Course  I have reviewed the triage vital signs and the nursing notes.  Pertinent labs & imaging results that were available during my care of the patient were reviewed by me and considered in my medical decision making (see chart for details).  Clinical Course  Patient presents to ED with left groin pain with history of dvt on xarel since April. [place] of lower extremity shows acute dvt. Patient has been on xarelto since April. She is suppose to discontinue Nov 1. She has been pain free until last week. She denies any cp or sob. Given length of treatment and US showing acute dvt discussed with Dr. Eulis Foster who agree patient has failed xarelto therapy and suggest switching to Lovenox. Discussed with patient and she is agreeable to plan. Creatine was normal. Discussed with pharmacy and they recommend 60 mg BID for 2-3 days and then 100 mg QD for 3 months. Patient was  given first injection in ED with teaching. She feels comfortable with plan. Dr. Eulis Foster is agreeable to plan. Patient is encouraged to follow up with pcp this week for continued care. She verbalizes understanding with plan. Prescriptions given. Discharged home in NAD with stable vs. Strict return precautions given including sob or cp. Final Clinical Impressions(s) / ED Diagnoses   Final diagnoses:  Chronic deep vein thrombosis (DVT) of femoral vein of left lower extremity ([place])    New Prescriptions Discharge Medication List as of 07/08/2016  4:42 PM    START taking these medications   Details  !! enoxaparin (LOVENOX) 100 MG/ML injection Inject 0.6 mLs (60 mg total) into the skin 2 (two) times daily. 60 mg BID for 2 days., Starting Sun 07/08/2016, Print    !! enoxaparin (LOVENOX) 100 MG/ML injection Inject 1 mL (100 mg total) into the skin daily. 100 mg once a day for  1 month starting Wednesday 07/11/2016, Starting Sun 07/08/2016, Print     !! - Potential duplicate medications found. Please discuss with provider.       Doristine Devoid, PA-C 07/09/16 [place], MD 07/11/16 [phone]

## 2016-07-08 NOTE — Progress Notes (Signed)
ANTICOAGULATION CONSULT NOTE - Initial Consult  Pharmacy Consult for Lovenox Indication: DVT  No Known Allergies  Patient Measurements: Height: 5\' 2"  (157.5 cm) Weight: 135 lb (61.2 kg) IBW/kg (Calculated) : 50.1  Vital Signs: Temp: 98.1 F (36.7 C) (10/15 1015) Temp Source: Oral (10/15 1015) BP: 102/63 (10/15 1302) Pulse Rate: 64 (10/15 1302)  Labs: No results for input(s): HGB, HCT, PLT, APTT, LABPROT, INR, HEPARINUNFRC, HEPRLOWMOCWT, CREATININE, CKTOTAL, CKMB, TROPONINI in the last 72 hours.  CrCl cannot be calculated (Patient's most recent lab result is older than the maximum 21 days allowed.).   Medical History: Past Medical History:  Diagnosis Date  . Asthma   . DVT (deep venous thrombosis) (HCC)     Medications:   (Not in a hospital admission) Scheduled:   Assessment: 30yo F with groin pain similar to previously when diagnosed with DVT in March 2017. She has been on Xarelto since then, last taken at 8pm on 07/07/16. Doppler performed. H/H and SCr are wnl. Pharmacy is asked to dose Lovenox.    Goal of Therapy:  Anti-Xa level 0.6-1 units/ml 4hrs after LMWH dose given Monitor platelets by anticoagulation protocol: Yes   Plan:  Start Lovenox 60mg  sq q12h. CBC daily.  F/u daily.  Romeo Rabon, PharmD, pager [phone]. 07/08/2016,3:18 PM.

## 2016-07-10 ENCOUNTER — Encounter (HOSPITAL_COMMUNITY): Payer: Self-pay | Admitting: *Deleted

## 2016-07-10 ENCOUNTER — Other Ambulatory Visit: Payer: Self-pay

## 2016-07-10 DIAGNOSIS — I82412 Acute embolism and thrombosis of left femoral vein: Secondary | ICD-10-CM

## 2016-07-10 DIAGNOSIS — Z01810 Encounter for preprocedural cardiovascular examination: Secondary | ICD-10-CM

## 2016-07-10 NOTE — Progress Notes (Signed)
Spoke with pt for pre-op call. Pt denies cardiac history, chest pain or sob. States she took her last dose of Lovenox this AM as instructed by Dr. Stephens [place] office.

## 2016-07-11 ENCOUNTER — Encounter (HOSPITAL_COMMUNITY): Payer: Self-pay

## 2016-07-11 ENCOUNTER — Ambulatory Visit (HOSPITAL_BASED_OUTPATIENT_CLINIC_OR_DEPARTMENT_OTHER)
Admission: RE | Admit: 2016-07-11 | Discharge: 2016-07-11 | Disposition: A | Discharge status: Home / Self Care | Payer: Medicaid Other | Source: Ambulatory Visit

## 2016-07-11 ENCOUNTER — Observation Stay (HOSPITAL_COMMUNITY)
Admission: RE | Admit: 2016-07-11 | Discharge: 2016-07-12 | Disposition: A | Discharge status: Home / Self Care | Payer: Medicaid Other | Source: Ambulatory Visit | Attending: Surgery | Admitting: Surgery

## 2016-07-11 ENCOUNTER — Inpatient Hospital Stay (HOSPITAL_COMMUNITY): Payer: Medicaid Other | Admitting: Certified Registered Nurse Anesthetist

## 2016-07-11 ENCOUNTER — Ambulatory Visit (HOSPITAL_COMMUNITY): Admit: 2016-07-11 | Payer: Medicaid Other | Admitting: Vascular Surgery

## 2016-07-11 ENCOUNTER — Encounter (HOSPITAL_COMMUNITY): Payer: Self-pay | Admitting: Urology

## 2016-07-11 ENCOUNTER — Encounter (HOSPITAL_COMMUNITY)
Admission: RE | Disposition: A | Discharge status: Home / Self Care | Payer: Self-pay | Source: Ambulatory Visit | Attending: Surgery

## 2016-07-11 DIAGNOSIS — I825Z2 Chronic embolism and thrombosis of unspecified deep veins of left distal lower extremity: Secondary | ICD-10-CM | POA: Diagnosis not present

## 2016-07-11 DIAGNOSIS — Z87442 Personal history of urinary calculi: Secondary | ICD-10-CM | POA: Diagnosis not present

## 2016-07-11 DIAGNOSIS — Z8709 Personal history of other diseases of the respiratory system: Secondary | ICD-10-CM | POA: Insufficient documentation

## 2016-07-11 DIAGNOSIS — I871 Compression of vein: Secondary | ICD-10-CM | POA: Diagnosis not present

## 2016-07-11 DIAGNOSIS — I82502 Chronic embolism and thrombosis of unspecified deep veins of left lower extremity: Secondary | ICD-10-CM | POA: Diagnosis not present

## 2016-07-11 DIAGNOSIS — I82412 Acute embolism and thrombosis of left femoral vein: Secondary | ICD-10-CM | POA: Diagnosis not present

## 2016-07-11 DIAGNOSIS — Z86718 Personal history of other venous thrombosis and embolism: Secondary | ICD-10-CM | POA: Diagnosis not present

## 2016-07-11 DIAGNOSIS — N186 End stage renal disease: Secondary | ICD-10-CM | POA: Insufficient documentation

## 2016-07-11 DIAGNOSIS — Z7901 Long term (current) use of anticoagulants: Secondary | ICD-10-CM | POA: Insufficient documentation

## 2016-07-11 DIAGNOSIS — Z793 Long term (current) use of hormonal contraceptives: Secondary | ICD-10-CM | POA: Insufficient documentation

## 2016-07-11 DIAGNOSIS — I82409 Acute embolism and thrombosis of unspecified deep veins of unspecified lower extremity: Secondary | ICD-10-CM | POA: Diagnosis present

## 2016-07-11 DIAGNOSIS — Z01810 Encounter for preprocedural cardiovascular examination: Secondary | ICD-10-CM | POA: Diagnosis not present

## 2016-07-11 HISTORY — PX: LOWER EXTREMITY ANGIOGRAM: SHX5508

## 2016-07-11 HISTORY — DX: Personal history of urinary calculi: Z87.442

## 2016-07-11 LAB — COMPREHENSIVE METABOLIC PANEL
ALK PHOS: 43 U/L (ref 38–126)
ALT: 49 U/L (ref 14–54)
AST: 49 U/L — AB (ref 15–41)
Albumin: 3.3 g/dL — ABNORMAL LOW (ref 3.5–5.0)
Anion gap: 4 — ABNORMAL LOW (ref 5–15)
BUN: 10 mg/dL (ref 6–20)
CALCIUM: 9 mg/dL (ref 8.9–10.3)
CHLORIDE: 108 mmol/L (ref 101–111)
CO2: 27 mmol/L (ref 22–32)
CREATININE: 0.8 mg/dL (ref 0.44–1.00)
Glucose, Bld: 129 mg/dL — ABNORMAL HIGH (ref 65–99)
Potassium: 3.7 mmol/L (ref 3.5–5.1)
SODIUM: 139 mmol/L (ref 135–145)
Total Bilirubin: 3.3 mg/dL — ABNORMAL HIGH (ref 0.3–1.2)
Total Protein: 5.9 g/dL — ABNORMAL LOW (ref 6.5–8.1)

## 2016-07-11 LAB — POCT I-STAT 4, (NA,K, GLUC, HGB,HCT)
Glucose, Bld: 85 mg/dL (ref 65–99)
HCT: 47 % — ABNORMAL HIGH (ref 36.0–46.0)
HEMOGLOBIN: 16 g/dL — AB (ref 12.0–15.0)
Potassium: 4 mmol/L (ref 3.5–5.1)
Sodium: 143 mmol/L (ref 135–145)

## 2016-07-11 LAB — SURGICAL PCR SCREEN
MRSA, PCR: NEGATIVE
Staphylococcus aureus: NEGATIVE

## 2016-07-11 LAB — CBC
HCT: 34.2 % — ABNORMAL LOW (ref 36.0–46.0)
HEMOGLOBIN: 11.9 g/dL — AB (ref 12.0–15.0)
MCH: 32.6 pg (ref 26.0–34.0)
MCHC: 34.8 g/dL (ref 30.0–36.0)
MCV: 93.7 fL (ref 78.0–100.0)
PLATELETS: 177 10*3/uL (ref 150–400)
RBC: 3.65 MIL/uL — AB (ref 3.87–5.11)
RDW: 12.5 % (ref 11.5–15.5)
WBC: 11.2 10*3/uL — AB (ref 4.0–10.5)

## 2016-07-11 LAB — APTT: APTT: 31 s (ref 24–36)

## 2016-07-11 LAB — PROTIME-INR
INR: 0.94
INR: 1.16
PROTHROMBIN TIME: 12.5 s (ref 11.4–15.2)
Prothrombin Time: 14.8 seconds (ref 11.4–15.2)

## 2016-07-11 SURGERY — ANGIOGRAM, LOWER EXTREMITY
Anesthesia: General | Site: Leg Upper | Laterality: Left

## 2016-07-11 SURGERY — LOWER EXTREMITY VENOGRAPHY

## 2016-07-11 MED ORDER — MORPHINE SULFATE (PF) 2 MG/ML IV SOLN: 2.0000 mg | INTRAVENOUS | Status: DC | PRN

## 2016-07-11 MED ORDER — SODIUM CHLORIDE 0.9 % IV SOLN: INTRAVENOUS | Status: DC

## 2016-07-11 MED ORDER — POTASSIUM CHLORIDE CRYS ER 20 MEQ PO TBCR
20.0000 meq | EXTENDED_RELEASE_TABLET | Freq: Once | ORAL | Status: AC
  Administered 2016-07-11: 20 meq via ORAL
  Filled 2016-07-11: qty 1

## 2016-07-11 MED ORDER — PANTOPRAZOLE SODIUM 40 MG PO TBEC
40.0000 mg | DELAYED_RELEASE_TABLET | Freq: Every day | ORAL | Status: DC
  Administered 2016-07-11 – 2016-07-12 (×2): 40 mg via ORAL
  Filled 2016-07-11 (×3): qty 1

## 2016-07-11 MED ORDER — HEPARIN (PORCINE) IN NACL 100-0.45 UNIT/ML-% IJ SOLN
850.0000 [IU]/h | INTRAMUSCULAR | Status: DC
  Administered 2016-07-12: 800 [IU]/h via INTRAVENOUS
  Filled 2016-07-11: qty 250

## 2016-07-11 MED ORDER — ALUM & MAG HYDROXIDE-SIMETH 200-200-20 MG/5ML PO SUSP: 15.0000 mL | ORAL | Status: DC | PRN

## 2016-07-11 MED ORDER — LIDOCAINE HCL (CARDIAC) 20 MG/ML IV SOLN
INTRAVENOUS | Status: DC | PRN
  Administered 2016-07-11: 60 mg via INTRAVENOUS

## 2016-07-11 MED ORDER — GLYCOPYRROLATE 0.2 MG/ML IV SOSY
PREFILLED_SYRINGE | INTRAVENOUS | Status: AC
  Filled 2016-07-11: qty 3

## 2016-07-11 MED ORDER — PANTOPRAZOLE SODIUM 40 MG PO TBEC: 40.0000 mg | DELAYED_RELEASE_TABLET | Freq: Every day | ORAL | Status: DC

## 2016-07-11 MED ORDER — MUPIROCIN 2 % EX OINT
1.0000 "application " | TOPICAL_OINTMENT | Freq: Once | CUTANEOUS | Status: AC
  Administered 2016-07-11: 1 via TOPICAL

## 2016-07-11 MED ORDER — ONDANSETRON HCL 4 MG/2ML IJ SOLN: 4.0000 mg | Freq: Four times a day (QID) | INTRAMUSCULAR | Status: DC | PRN

## 2016-07-11 MED ORDER — LABETALOL HCL 5 MG/ML IV SOLN: 10.0000 mg | INTRAVENOUS | Status: DC | PRN

## 2016-07-11 MED ORDER — FENTANYL CITRATE (PF) 100 MCG/2ML IJ SOLN
25.0000 ug | INTRAMUSCULAR | Status: DC | PRN
  Administered 2016-07-11 (×2): 50 ug via INTRAVENOUS

## 2016-07-11 MED ORDER — GLYCOPYRROLATE 0.2 MG/ML IJ SOLN
INTRAMUSCULAR | Status: DC | PRN
  Administered 2016-07-11: 0.4 mg via INTRAVENOUS

## 2016-07-11 MED ORDER — PROMETHAZINE HCL 25 MG/ML IJ SOLN
INTRAMUSCULAR | Status: AC
  Filled 2016-07-11: qty 1

## 2016-07-11 MED ORDER — NEOSTIGMINE METHYLSULFATE 10 MG/10ML IV SOLN
INTRAVENOUS | Status: DC | PRN
  Administered 2016-07-11: 3 mg via INTRAVENOUS

## 2016-07-11 MED ORDER — FENTANYL CITRATE (PF) 100 MCG/2ML IJ SOLN
INTRAMUSCULAR | Status: AC
  Filled 2016-07-11: qty 2

## 2016-07-11 MED ORDER — POTASSIUM CHLORIDE CRYS ER 20 MEQ PO TBCR: 20.0000 meq | EXTENDED_RELEASE_TABLET | Freq: Once | ORAL | Status: DC

## 2016-07-11 MED ORDER — HYDRALAZINE HCL 20 MG/ML IJ SOLN: 5.0000 mg | INTRAMUSCULAR | Status: DC | PRN

## 2016-07-11 MED ORDER — SODIUM CHLORIDE 0.9 % IV SOLN
INTRAVENOUS | Status: DC | PRN
  Administered 2016-07-11: 1000 mL via INTRAMUSCULAR

## 2016-07-11 MED ORDER — SODIUM CHLORIDE 0.9 % IV SOLN
INTRAVENOUS | Status: DC | PRN
  Administered 2016-07-11: 10 mg via INTRAVENOUS

## 2016-07-11 MED ORDER — OXYCODONE HCL 5 MG PO TABS
5.0000 mg | ORAL_TABLET | ORAL | Status: DC | PRN
  Administered 2016-07-11 – 2016-07-12 (×2): 10 mg via ORAL
  Filled 2016-07-11 (×2): qty 2

## 2016-07-11 MED ORDER — MIDAZOLAM HCL 2 MG/2ML IJ SOLN
INTRAMUSCULAR | Status: AC
  Filled 2016-07-11: qty 2

## 2016-07-11 MED ORDER — METOPROLOL TARTRATE 5 MG/5ML IV SOLN: 2.0000 mg | INTRAVENOUS | Status: DC | PRN

## 2016-07-11 MED ORDER — IOPAMIDOL (ISOVUE-300) INJECTION 61%
INTRAVENOUS | Status: DC | PRN
  Administered 2016-07-11: 100 mL via INTRA_ARTERIAL

## 2016-07-11 MED ORDER — HEPARIN SODIUM (PORCINE) 1000 UNIT/ML IJ SOLN
INTRAMUSCULAR | Status: DC | PRN
  Administered 2016-07-11: 6000 [IU] via INTRAVENOUS
  Administered 2016-07-11: 2000 [IU] via INTRAVENOUS

## 2016-07-11 MED ORDER — FENTANYL CITRATE (PF) 100 MCG/2ML IJ SOLN
INTRAMUSCULAR | Status: DC | PRN
  Administered 2016-07-11 (×3): 50 ug via INTRAVENOUS

## 2016-07-11 MED ORDER — KETOROLAC TROMETHAMINE 30 MG/ML IJ SOLN
INTRAMUSCULAR | Status: DC | PRN
  Administered 2016-07-11: 30 mg via INTRAVENOUS

## 2016-07-11 MED ORDER — PROPOFOL 10 MG/ML IV BOLUS
INTRAVENOUS | Status: DC | PRN
  Administered 2016-07-11: 200 mg via INTRAVENOUS

## 2016-07-11 MED ORDER — 0.9 % SODIUM CHLORIDE (POUR BTL) OPTIME
TOPICAL | Status: DC | PRN
  Administered 2016-07-11: 1000 mL

## 2016-07-11 MED ORDER — HYDROCODONE-ACETAMINOPHEN 7.5-325 MG PO TABS: 1.0000 | ORAL_TABLET | Freq: Once | ORAL | Status: DC | PRN

## 2016-07-11 MED ORDER — GUAIFENESIN-DM 100-10 MG/5ML PO SYRP: 15.0000 mL | ORAL_SOLUTION | ORAL | Status: DC | PRN

## 2016-07-11 MED ORDER — FENTANYL CITRATE (PF) 250 MCG/5ML IJ SOLN
INTRAMUSCULAR | Status: AC
  Filled 2016-07-11: qty 5

## 2016-07-11 MED ORDER — MIDAZOLAM HCL 2 MG/2ML IJ SOLN
INTRAMUSCULAR | Status: DC | PRN
  Administered 2016-07-11: 2 mg via INTRAVENOUS

## 2016-07-11 MED ORDER — CHLORHEXIDINE GLUCONATE CLOTH 2 % EX PADS: 6.0000 | MEDICATED_PAD | Freq: Once | CUTANEOUS | Status: DC

## 2016-07-11 MED ORDER — SODIUM CHLORIDE 0.9 % IV SOLN
INTRAVENOUS | Status: DC
  Filled 2016-07-11: qty 10

## 2016-07-11 MED ORDER — SUCCINYLCHOLINE CHLORIDE 20 MG/ML IJ SOLN
INTRAMUSCULAR | Status: DC | PRN
  Administered 2016-07-11: 70 mg via INTRAVENOUS

## 2016-07-11 MED ORDER — MUPIROCIN 2 % EX OINT
TOPICAL_OINTMENT | CUTANEOUS | Status: AC
  Filled 2016-07-11: qty 22

## 2016-07-11 MED ORDER — HEPARIN SODIUM (PORCINE) 5000 UNIT/ML IJ SOLN
INTRAMUSCULAR | Status: DC | PRN
  Administered 2016-07-11: 500 mL

## 2016-07-11 MED ORDER — PROMETHAZINE HCL 25 MG/ML IJ SOLN
6.2500 mg | INTRAMUSCULAR | Status: DC | PRN
  Administered 2016-07-11: 12.5 mg via INTRAVENOUS

## 2016-07-11 MED ORDER — HEPARIN SODIUM (PORCINE) 1000 UNIT/ML IJ SOLN
INTRAMUSCULAR | Status: AC
  Filled 2016-07-11: qty 1

## 2016-07-11 MED ORDER — PHENOL 1.4 % MT LIQD: 1.0000 | OROMUCOSAL | Status: DC | PRN

## 2016-07-11 MED ORDER — LACTATED RINGERS IV SOLN
INTRAVENOUS | Status: DC
  Administered 2016-07-11 (×3): via INTRAVENOUS

## 2016-07-11 MED ORDER — ACETAMINOPHEN 325 MG RE SUPP
325.0000 mg | RECTAL | Status: DC | PRN
Start: 2016-07-11 — End: 2016-07-12

## 2016-07-11 MED ORDER — OXYCODONE-ACETAMINOPHEN 5-325 MG PO TABS: 1.0000 | ORAL_TABLET | ORAL | Status: DC | PRN

## 2016-07-11 MED ORDER — PROPOFOL 10 MG/ML IV BOLUS
INTRAVENOUS | Status: AC
  Filled 2016-07-11: qty 20

## 2016-07-11 MED ORDER — ACETAMINOPHEN 325 MG PO TABS
325.0000 mg | ORAL_TABLET | ORAL | Status: DC | PRN
  Filled 2016-07-11: qty 2

## 2016-07-11 MED ORDER — DEXTROSE 5 % IV SOLN
1.5000 g | INTRAVENOUS | Status: AC
  Administered 2016-07-11: 1.5 g via INTRAVENOUS
  Filled 2016-07-11: qty 1.5

## 2016-07-11 MED ORDER — ROCURONIUM BROMIDE 100 MG/10ML IV SOLN
INTRAVENOUS | Status: DC | PRN
  Administered 2016-07-11: 10 mg via INTRAVENOUS
  Administered 2016-07-11: 30 mg via INTRAVENOUS
  Administered 2016-07-11: 10 mg via INTRAVENOUS

## 2016-07-11 MED ORDER — KETOROLAC TROMETHAMINE 30 MG/ML IJ SOLN
INTRAMUSCULAR | Status: AC
  Filled 2016-07-11: qty 1

## 2016-07-11 MED ORDER — NEOSTIGMINE METHYLSULFATE 5 MG/5ML IV SOSY
PREFILLED_SYRINGE | INTRAVENOUS | Status: AC
  Filled 2016-07-11: qty 5

## 2016-07-11 MED ORDER — HYDRALAZINE HCL 20 MG/ML IJ SOLN
5.0000 mg | INTRAMUSCULAR | Status: DC | PRN
Start: 2016-07-11 — End: 2016-07-11

## 2016-07-11 SURGICAL SUPPLY — 96 items
BAG BANDED W/RUBBER/TAPE 36X54 (MISCELLANEOUS) ×2 IMPLANT
BAG SNAP BAND KOVER 36X36 (MISCELLANEOUS) ×4 IMPLANT
BALLN ATLAS 12X40X75 (BALLOONS) ×2
BALLN ATLAS 14X40X75 (BALLOONS) ×2
BALLOON ATLAS 12X40X75 (BALLOONS) ×1 IMPLANT
BALLOON ATLAS 14X40X75 (BALLOONS) ×1 IMPLANT
BANDAGE ACE 4X5 VEL STRL LF (GAUZE/BANDAGES/DRESSINGS) ×2 IMPLANT
BANDAGE ACE 6X5 VEL STRL LF (GAUZE/BANDAGES/DRESSINGS) ×2 IMPLANT
BANDAGE ESMARK 6X9 LF (GAUZE/BANDAGES/DRESSINGS) IMPLANT
BLADE 11 SAFETY STRL DISP (BLADE) ×2 IMPLANT
BLADE SURG 11 STRL SS (BLADE) ×2 IMPLANT
BNDG ESMARK 6X9 LF (GAUZE/BANDAGES/DRESSINGS)
CANISTER SUCTION 2500CC (MISCELLANEOUS) ×2 IMPLANT
CATH ANGIO 5F BER2 65CM (CATHETERS) ×2 IMPLANT
CATH OMNI FLUSH .035X70CM (CATHETERS) ×4 IMPLANT
CATH VISIONS PV .035 IVUS (CATHETERS) ×2 IMPLANT
CLIP TI MEDIUM 24 (CLIP) IMPLANT
CLIP TI WIDE RED SMALL 24 (CLIP) IMPLANT
COVER DOME SNAP 22 D (MISCELLANEOUS) ×6 IMPLANT
COVER PROBE W GEL 5X96 (DRAPES) ×2 IMPLANT
COVER SURGICAL LIGHT HANDLE (MISCELLANEOUS) ×2 IMPLANT
CUFF TOURNIQUET SINGLE 24IN (TOURNIQUET CUFF) IMPLANT
CUFF TOURNIQUET SINGLE 34IN LL (TOURNIQUET CUFF) IMPLANT
CUFF TOURNIQUET SINGLE 44IN (TOURNIQUET CUFF) IMPLANT
DERMABOND ADVANCED (GAUZE/BANDAGES/DRESSINGS) ×1
DERMABOND ADVANCED .7 DNX12 (GAUZE/BANDAGES/DRESSINGS) ×1 IMPLANT
DEVICE INFLATION ENCORE 26 (MISCELLANEOUS) ×2 IMPLANT
DEVICE TORQUE H2O (MISCELLANEOUS) ×2 IMPLANT
DRAIN CHANNEL 15F RND FF W/TCR (WOUND CARE) IMPLANT
DRAPE X-RAY CASS 24X20 (DRAPES) IMPLANT
DRSG COVADERM 4X10 (GAUZE/BANDAGES/DRESSINGS) IMPLANT
DRSG COVADERM 4X8 (GAUZE/BANDAGES/DRESSINGS) IMPLANT
DRSG TEGADERM 2-3/8X2-3/4 SM (GAUZE/BANDAGES/DRESSINGS) ×2 IMPLANT
ELECT REM PT RETURN 9FT ADLT (ELECTROSURGICAL)
ELECTRODE REM PT RTRN 9FT ADLT (ELECTROSURGICAL) IMPLANT
EVACUATOR SILICONE 100CC (DRAIN) IMPLANT
GAUZE SPONGE 2X2 8PLY STRL LF (GAUZE/BANDAGES/DRESSINGS) ×1 IMPLANT
GAUZE SPONGE 4X4 16PLY XRAY LF (GAUZE/BANDAGES/DRESSINGS) ×2 IMPLANT
GLOVE BIOGEL PI IND STRL 7.5 (GLOVE) ×1 IMPLANT
GLOVE BIOGEL PI INDICATOR 7.5 (GLOVE) ×1
GLOVE SKINSENSE NS SZ7.0 (GLOVE) ×2
GLOVE SKINSENSE STRL SZ7.0 (GLOVE) ×2 IMPLANT
GLOVE SURG SS PI 7.5 STRL IVOR (GLOVE) ×2 IMPLANT
GOWN STRL REUS W/ TWL LRG LVL3 (GOWN DISPOSABLE) ×2 IMPLANT
GOWN STRL REUS W/ TWL XL LVL3 (GOWN DISPOSABLE) ×1 IMPLANT
GOWN STRL REUS W/TWL LRG LVL3 (GOWN DISPOSABLE) ×2
GOWN STRL REUS W/TWL XL LVL3 (GOWN DISPOSABLE) ×1
GUIDEWIRE AMPLATZ SS .035X260 (WIRE) ×2 IMPLANT
GUIDEWIRE ANGLED .035X260CM (WIRE) ×2 IMPLANT
HEMOSTAT SNOW SURGICEL 2X4 (HEMOSTASIS) IMPLANT
KIT BASIN OR (CUSTOM PROCEDURE TRAY) ×2 IMPLANT
KIT HEART LEFT (KITS) ×2 IMPLANT
KIT ROOM TURNOVER OR (KITS) ×2 IMPLANT
MARKER GRAFT CORONARY BYPASS (MISCELLANEOUS) IMPLANT
NEEDLE PERC 18GX7CM (NEEDLE) ×4 IMPLANT
NS IRRIG 1000ML POUR BTL (IV SOLUTION) IMPLANT
PACK GENERAL/GYN (CUSTOM PROCEDURE TRAY) ×2 IMPLANT
PACK PERIPHERAL VASCULAR (CUSTOM PROCEDURE TRAY) IMPLANT
PAD ARMBOARD 7.5X6 YLW CONV (MISCELLANEOUS) ×4 IMPLANT
PADDING CAST ABS 6INX4YD NS (CAST SUPPLIES)
PADDING CAST ABS COTTON 6X4 NS (CAST SUPPLIES) IMPLANT
SET COLLECT BLD 21X3/4 12 (NEEDLE) IMPLANT
SET MICROPUNCTURE 5F STIFF (MISCELLANEOUS) ×2 IMPLANT
SET ZELANTE DVT THROMB (CATHETERS) ×2 IMPLANT
SHEATH AVANTI 11CM 5FR (MISCELLANEOUS) ×2 IMPLANT
SHEATH AVANTI 11CM 8FR (MISCELLANEOUS) ×2 IMPLANT
SHEATH PINNACLE 9F 10CM (SHEATH) ×2 IMPLANT
SHIELD RADPAD SCOOP 12X17 (MISCELLANEOUS) ×4 IMPLANT
SPONGE GAUZE 2X2 STER 10/PKG (GAUZE/BANDAGES/DRESSINGS) ×1
STENT WALLSTENT 12X20X75 (Permanent Stent) ×2 IMPLANT
STENT WALLSTENTÂ  12X60X75 (Permanent Stent) ×2 IMPLANT
STOPCOCK 4 WAY LG BORE MALE ST (IV SETS) IMPLANT
STOPCOCK MORSE 400PSI 3WAY (MISCELLANEOUS) ×4 IMPLANT
SUT ETHILON 3 0 PS 1 (SUTURE) IMPLANT
SUT PROLENE 5 0 C 1 24 (SUTURE) IMPLANT
SUT PROLENE 6 0 BV (SUTURE) IMPLANT
SUT PROLENE 7 0 BV 1 (SUTURE) IMPLANT
SUT SILK 2 0 SH (SUTURE) IMPLANT
SUT SILK 3 0 (SUTURE)
SUT SILK 3-0 18XBRD TIE 12 (SUTURE) IMPLANT
SUT VIC AB 2-0 CT1 27 (SUTURE)
SUT VIC AB 2-0 CT1 TAPERPNT 27 (SUTURE) IMPLANT
SUT VIC AB 3-0 SH 27 (SUTURE)
SUT VIC AB 3-0 SH 27X BRD (SUTURE) IMPLANT
SUT VICRYL 4-0 PS2 18IN ABS (SUTURE) IMPLANT
SYR 20CC LL (SYRINGE) ×6 IMPLANT
SYR 30ML LL (SYRINGE) ×4 IMPLANT
SYR MEDRAD MARK V 150ML (SYRINGE) ×4 IMPLANT
SYRINGE 10CC LL (SYRINGE) ×4 IMPLANT
TOWEL OR 17X26 10 PK STRL BLUE (TOWEL DISPOSABLE) ×2 IMPLANT
TRAY FOLEY W/METER SILVER 16FR (SET/KITS/TRAYS/PACK) IMPLANT
TUBING EXTENTION W/L.L. (IV SETS) IMPLANT
TUBING HIGH PRESSURE 120CM (CONNECTOR) ×4 IMPLANT
UNDERPAD 30X30 (UNDERPADS AND DIAPERS) IMPLANT
WATER STERILE IRR 1000ML POUR (IV SOLUTION) IMPLANT
WIRE BENTSON .035X145CM (WIRE) ×4 IMPLANT

## 2016-07-11 NOTE — Anesthesia Preprocedure Evaluation (Addendum)
Anesthesia Evaluation  Patient identified by MRN, date of birth, ID band Patient awake    Reviewed: Allergy & Precautions, H&P , NPO status , Patient's Chart, lab work & pertinent test results  History of Anesthesia Complications Negative for: history of anesthetic complications  Airway Mallampati: II  TM Distance: >3 FB Neck ROM: full    Dental no notable dental hx. (+) Teeth Intact, Dental Advisory Given   Pulmonary asthma ,    Pulmonary exam normal breath sounds clear to auscultation       Cardiovascular + Peripheral Vascular Disease  Normal cardiovascular exam Rhythm:regular Rate:Normal     Neuro/Psych negative neurological ROS     GI/Hepatic negative GI ROS, Neg liver ROS,   Endo/Other  negative endocrine ROS  Renal/GU negative Renal ROS     Musculoskeletal   Abdominal   Peds  Hematology negative hematology ROS (+)   Anesthesia Other Findings DVT  Reproductive/Obstetrics negative OB ROS                            Anesthesia Physical Anesthesia Plan  ASA: II  Anesthesia Plan: General   Post-op Pain Management:    Induction: Intravenous  Airway Management Planned: LMA  Additional Equipment:   Intra-op Plan:   Post-operative Plan: Extubation in OR  Informed Consent: I have reviewed the patients History and Physical, chart, labs and discussed the procedure including the risks, benefits and alternatives for the proposed anesthesia with the patient or authorized representative who has indicated his/her understanding and acceptance.   Dental Advisory Given  Plan Discussed with: Anesthesiologist, CRNA and Surgeon  Anesthesia Plan Comments:         Anesthesia Quick Evaluation

## 2016-07-11 NOTE — Progress Notes (Addendum)
**  Preliminary report by tech**  Inferior vena cava venous duplex completed. Technically difficult study due to overlying bowel gas, and acoustic shadowing. 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  There is no evidence of deep vein thrombosis involving the inferior vena cava, right common iliac vein, or right external iliac vein.  07/11/16 10:51 AM Carlos Levering RVT

## 2016-07-11 NOTE — Transfer of Care (Signed)
Immediate Anesthesia Transfer of Care Note  Patient: Paula Warner  Procedure(s) Performed: Procedure(s): Percutaneous Venous Thrombectomy with IVUS and  with Stent Placement (Left)  Patient Location: PACU  Anesthesia Type:General  Level of Consciousness: awake, oriented and patient cooperative  Airway & Oxygen Therapy: Patient Spontanous Breathing and Patient connected to nasal cannula oxygen  Post-op Assessment: Report given to RN and Post -op Vital signs reviewed and stable  Post vital signs: Reviewed and stable  Last Vitals:  Vitals:   07/11/16 1158  BP: 110/71  Pulse: 75  Resp: 16  Temp: 37.3 C    Last Pain:  Vitals:   07/11/16 1900  TempSrc:   PainSc: (P) Asleep         Complications: No apparent anesthesia complications

## 2016-07-11 NOTE — Op Note (Signed)
Patient name: Paula Warner MRN: [id number] DOB: November 09, 1985 Sex: female  07/11/2016 Pre-operative Diagnosis: Recurrent left leg DVT Post-operative diagnosis:  Same Surgeon:  Annamarie Major Assistants:  Servando Snare Procedure:   #1:  U/s guided access, left popliteal vein   #2:  Ilio-caval and left leg venogram   #3:  IVUS, Left femoral, common femoral, external, common iliac vein, and inferior vena cava   #4:  Angiojet mechanical thrombectomy left femoral, common femoral, external iliac and common iliac vein   #5:  Stent left common femoral, external, and common iliac vein   #6:  Angioplasty left femoral vein Anesthesia:  General Blood Loss:  See anesthesia record Specimens:  none  Findings:  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  She recently had an acute DVT in her left leg and swelling.  She comes in today for further evaluation and possible intervention.  Procedure:  The patient was identified in the holding area and taken to [place] 16  The patient was then placed supine on the table. general anesthesia was administered.  The patient was prepped and draped in the usual sterile fashion.  A time out was called and antibiotics were administered.  Ultrasound was used to value the left popliteal artery which is widely patent.  This was cannulated under ultrasound guidance with an 18-gauge needle.  A 035 wire was advanced without resistance followed by a 9 French sheath.  The patient was fully heparinized.  Using a Berenstein 2 catheter and a Glidewire, I advanced the wire up into the common femoral vein where it met significant resistance, suggesting occlusion.  The occlusion was then successfully crossed with a Berenstein catheter and Glidewire.  I advanced a catheter into the inferior vena cava and shot a inferior venacavogram which showed a patent inferior  vena cava.  A Amplatz superstiff wire was placed.  The IVUS catheter was then inserted and advanced into the inferior vena cava.  A pullback image was recorded of the inferior vena cava, common iliac, external iliac, common femoral, and femoral vein.  This showed mostly chronic occlusive disease.  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 Next, a Zalente catheter was inserted and mechanical thrombectomy was performed of the femoral vein, common femoral, external iliac, and common iliac vein and a antegrade and retrograde utilization.  The IVUS catheter was then reinserted, and repeat ultrasound imaging's were performed which showed some residual chronic thrombus but a patent flow channel.  I then performed a venogram of the left leg imaging the femoral, common femoral, external iliac, common iliac and inferior vena cava.  There was now in line flow.  There was some residual chronic thrombus seen by IVUS.  A 12 x 40 Atlas balloon was then used to perform balloon venoplasty of the femoral vein, common femoral, external iliac, and common iliac vein.  Additional use of the AngioJet catheter was utilized in these veins.  I then elected to stent across the areas of concern.  A Wallstent, 16 x 90 device was deployed and then ballooned using a 12 mm balloon.  The common iliac and external iliac vein were stented with the proximal part of the stent just above the vena cava bifurcation.  I felt a second stent was required given where the proximal stent deployed it was not long enough.  I selected a 14 x 90 Wallstent to complete stenting of the external iliac and common femoral vein.  I made sure that the stent stayed proximal to the saphenofemoral junction.  This stent was also molded with a 12 x 40 balloon.  A follow-up image was then performed which showed in-line flow through the left leg venous system.  The  IVUS catheter was then reinserted and used to image the  stent which was widely patent.  At this point I was satisfied with our results.  Catheters and wires were removed and the 9 French sheath was removed and manual pressure was held for 15 minutes.  The leg was then wrapped with an Ace wrap from the foot up to the groin.  There were no immediate complications.  The patient was successfully extubated.   Disposition:  To PACU in stable condition.   Theotis Burrow, M.D. [facility] [place] Office: [phone] Pager:  [phone]

## 2016-07-11 NOTE — Brief Op Note (Deleted)
0

## 2016-07-11 NOTE — Progress Notes (Signed)
ANTICOAGULATION CONSULT NOTE - Initial Consult  Pharmacy Consult for Heparin Indication: DVT  Allergies  Allergen Reactions  . No Known Allergies     Patient Measurements: Height: 5\' 2"  (157.5 cm) Weight: 135 lb (61.2 kg) IBW/kg (Calculated) : 50.1   Vital Signs: Temp: 98 F (36.7 C) (10/18 2039) Temp Source: Oral (10/18 2039) BP: 138/83 (10/18 2039) Pulse Rate: 53 (10/18 2039)  Labs:  Recent Labs  07/11/16 1218 07/11/16 1250  HGB  --  16.0*  HCT  --  47.0*  APTT 31  --   LABPROT 12.5  --   INR 0.94  --     Estimated Creatinine Clearance: 90.1 mL/min (by C-G formula based on SCr of 0.6 mg/dL).   Medical History: Past Medical History:  Diagnosis Date  . Anemia    7-8 years ago (as of 06/2016)  . Asthma   . DVT (deep venous thrombosis) ([place])   . History of kidney stones      Assessment: 30yof admitted with recurrent DVT s/p thrombectomy.  Plan to start heparin drip.  CBC stable, No bleeding,   Goal of Therapy:  Heparin level 0.3-0.7 units/ml Monitor platelets by anticoagulation protocol: Yes   Plan:  Heparin drip 800 uts/hr Daily HL, CBC  Bonnita Nasuti Pharm.D. CPP, BCPS Clinical Pharmacist [phone] 07/11/2016 9:15 PM

## 2016-07-11 NOTE — Anesthesia Postprocedure Evaluation (Signed)
Anesthesia Post Note  Patient: Paula Warner  Procedure(s) Performed: Procedure(s) (LRB): Percutaneous Venous Thrombectomy with IVUS and  with Stent Placement (Left)  Patient location during evaluation: PACU Anesthesia Type: General Level of consciousness: awake and alert Pain management: pain level controlled Vital Signs Assessment: post-procedure vital signs reviewed and stable Respiratory status: spontaneous breathing, nonlabored ventilation, respiratory function stable and patient connected to nasal cannula oxygen Cardiovascular status: blood pressure returned to baseline and stable Postop Assessment: no signs of nausea or vomiting Anesthetic complications: no    Last Vitals:  Vitals:   07/11/16 1945 07/11/16 1949  BP:  (!) 126/93  Pulse: 77 70  Resp: 13 (!) 21  Temp:      Last Pain:  Vitals:   07/11/16 1935  TempSrc:   PainSc: 10-Worst pain ever                 [name]

## 2016-07-11 NOTE — Brief Op Note (Signed)
07/11/2016  9:04 PM  PATIENT:  Paula Warner  30 y.o. female  PRE-OPERATIVE DIAGNOSIS:  DVT  POST-OPERATIVE DIAGNOSIS:  DVT   PROCEDURE:  Procedure(s): Percutaneous Venous Thrombectomy with IVUS and  with Stent Placement (Left)  SURGEON:  Surgeon(s) and Role:    Serafina Mitchell, MD - Primary    * Waynetta Sandy, MD - Assisting  PHYSICIAN ASSISTANT:   ASSISTANTS: Servando Snare   ANESTHESIA:   general  EBL:  Total I/O In: 100 [I.V.:100] Out: 100 [Urine:100]  BLOOD ADMINISTERED:none  DRAINS: none   LOCAL MEDICATIONS USED:  NONE  SPECIMEN:  No Specimen  DISPOSITION OF SPECIMEN:  N/A  COUNTS:  YES  TOURNIQUET:  * No tourniquets in log *  DICTATION: .Dragon Dictation  PLAN OF CARE: Admit for overnight observation  PATIENT DISPOSITION:  PACU - hemodynamically stable.   Delay start of Pharmacological VTE agent (>24hrs) due to surgical blood loss or risk of bleeding: full dose anti-coagulation

## 2016-07-11 NOTE — Anesthesia Procedure Notes (Signed)
Procedure Name: Intubation Date/Time: 07/11/2016 4:17 PM Performed by: Sampson Si E Pre-anesthesia Checklist: Patient identified, Emergency Drugs available, Suction available and Patient being monitored Patient Re-evaluated:Patient Re-evaluated prior to inductionOxygen Delivery Method: Circle System Utilized Preoxygenation: Pre-oxygenation with 100% oxygen Intubation Type: IV induction Ventilation: Mask ventilation without difficulty Laryngoscope Size: Mac and 3 Grade View: Grade I Tube type: Oral Tube size: 7.0 mm Number of attempts: 1 Airway Equipment and Method: Stylet and Oral airway Placement Confirmation: ETT inserted through vocal cords under direct vision,  positive ETCO2 and breath sounds checked- equal and bilateral Secured at: 21 cm Tube secured with: Tape Dental Injury: Teeth and Oropharynx as per pre-operative assessment

## 2016-07-11 NOTE — H&P (Signed)
   Patient name: Paula Warner MRN: [id number] DOB: 1986-05-15 Sex: female  REASON FOR VISIT: DVT  HPI: Paula Warner is a 30 y.o. female 30 yo with recurrent DVT while on anticoagulation  Current Facility-Administered Medications  Medication Dose Route Frequency Provider Last Rate Last Dose  . 0.9 %  sodium chloride infusion   Intravenous Continuous Serafina Mitchell, MD      . cefUROXime (ZINACEF) 1.5 g in dextrose 5 % 50 mL IVPB  1.5 g Intravenous 30 min Pre-Op Serafina Mitchell, MD      . Chlorhexidine Gluconate Cloth 2 % PADS 6 each  6 each Topical Once Serafina Mitchell, MD      . lactated ringers infusion   Intravenous Continuous Jillyn Hidden, MD 10 mL/hr at 07/11/16 1242    . mupirocin ointment (BACTROBAN) 2 %             REVIEW OF SYSTEMS:  [X]  denotes positive finding, [ ]  denotes negative finding Cardiac  Comments:  Chest pain or chest pressure:    Shortness of breath upon exertion:    Short of breath when lying flat:    Irregular heart rhythm:    Constitutional    Fever or chills:      PHYSICAL EXAM: Vitals:   07/11/16 1158  BP: 110/71  Pulse: 75  Resp: 16  Temp: 99.1 F (37.3 C)  TempSrc: Oral  SpO2: 99%  Weight: 135 lb (61.2 kg)  Height: 5\' 2"  (1.575 m)    GENERAL: The patient is a well-nourished female, in no acute distress. The vital signs are documented above. CARDIOVASCULAR: There is a regular rate and rhythm. PULMONARY: There is good air exchange bilaterally without wheezing or rales. L LE edema  MEDICAL ISSUES: Plan venograam and possible thrombolysis and thrombectomy  Annamarie Major, MD [facility] [phone] Pager [phone]

## 2016-07-12 ENCOUNTER — Encounter (HOSPITAL_COMMUNITY): Payer: Self-pay

## 2016-07-12 DIAGNOSIS — I871 Compression of vein: Secondary | ICD-10-CM | POA: Diagnosis present

## 2016-07-12 DIAGNOSIS — I825Z2 Chronic embolism and thrombosis of unspecified deep veins of left distal lower extremity: Secondary | ICD-10-CM | POA: Diagnosis not present

## 2016-07-12 LAB — CBC
HEMATOCRIT: 34.1 % — AB (ref 36.0–46.0)
Hemoglobin: 11.6 g/dL — ABNORMAL LOW (ref 12.0–15.0)
MCH: 32 pg (ref 26.0–34.0)
MCHC: 34 g/dL (ref 30.0–36.0)
MCV: 93.9 fL (ref 78.0–100.0)
PLATELETS: 190 10*3/uL (ref 150–400)
RBC: 3.63 MIL/uL — ABNORMAL LOW (ref 3.87–5.11)
RDW: 12.5 % (ref 11.5–15.5)
WBC: 10.4 10*3/uL (ref 4.0–10.5)

## 2016-07-12 LAB — URINALYSIS, ROUTINE W REFLEX MICROSCOPIC
Glucose, UA: 100 mg/dL — AB
Ketones, ur: 15 mg/dL — AB
Nitrite: POSITIVE — AB
Specific Gravity, Urine: 1.01 (ref 1.005–1.030)
pH: 7.5 (ref 5.0–8.0)

## 2016-07-12 LAB — COMPREHENSIVE METABOLIC PANEL
ALBUMIN: 3.3 g/dL — AB (ref 3.5–5.0)
ALT: 44 U/L (ref 14–54)
ANION GAP: 4 — AB (ref 5–15)
AST: 43 U/L — ABNORMAL HIGH (ref 15–41)
Alkaline Phosphatase: 41 U/L (ref 38–126)
BILIRUBIN TOTAL: 4.3 mg/dL — AB (ref 0.3–1.2)
BUN: 10 mg/dL (ref 6–20)
CHLORIDE: 108 mmol/L (ref 101–111)
CO2: 27 mmol/L (ref 22–32)
Calcium: 8.7 mg/dL — ABNORMAL LOW (ref 8.9–10.3)
Creatinine, Ser: 0.73 mg/dL (ref 0.44–1.00)
GFR calc Af Amer: >60 mL/min (ref >60)
Glucose, Bld: 96 mg/dL (ref 65–99)
POTASSIUM: 3.9 mmol/L (ref 3.5–5.1)
Sodium: 139 mmol/L (ref 135–145)
TOTAL PROTEIN: 5.9 g/dL — AB (ref 6.5–8.1)

## 2016-07-12 LAB — HEPARIN LEVEL (UNFRACTIONATED)
HEPARIN UNFRACTIONATED: 0.16 [IU]/mL — AB (ref 0.30–0.70)
Heparin Unfractionated: 0.3 IU/mL (ref 0.30–0.70)

## 2016-07-12 LAB — URINE MICROSCOPIC-ADD ON

## 2016-07-12 MED ORDER — RIVAROXABAN 20 MG PO TABS: 20.0000 mg | ORAL_TABLET | Freq: Every day | ORAL | 11 refills | Status: DC

## 2016-07-12 NOTE — Care Management Note (Signed)
Case Management Note Marvetta Gibbons RN, BSN Unit 2W-Case Manager [phone]  Patient Details  Name: Paula Warner MRN: [id number] Date of Birth: 30-May-1986  Subjective/Objective:  Pt admitted   s/p Percutaneous Venous Thrombectomy with IVUS and with Stent Placement (Left)               Action/Plan: PTA pt lived at home- noted plan to restart Xarelto- pt had been on Xarelto in the past- is a preferred drug on Medicaid drug list- no CM needs noted.   Expected Discharge Date:   07/12/16               Expected Discharge Plan:  Home/Self Care  In-House Referral:     Discharge planning Services  CM Consult, Medication Assistance  Post Acute Care Choice:    Choice offered to:     DME Arranged:    DME Agency:     HH Arranged:    HH Agency:     Status of Service:  Completed, signed off  If discussed at [employer] of Stay Meetings, dates discussed:    Additional Comments:  Dawayne Patricia, RN 07/12/2016, 11:31 AM

## 2016-07-12 NOTE — Progress Notes (Addendum)
  [facility] Progress Note  Subjective  - POD #1  Having some discomfort behind left knee.   Objective Vitals:   07/12/16 0408 07/12/16 0801  BP: (!) 100/51 (!) 97/52  Pulse: 77 78  Resp: 18 18  Temp: 98.6 F (37 C) 99.3 F (37.4 C)    Intake/Output Summary (Last 24 hours) at 07/12/16 1034 Last data filed at 07/12/16 0956  Gross per 24 hour  Intake          3444.87 ml  Output             2051 ml  Net          1393.87 ml   Left popliteal fossa without hematoma.  Palpable left DP pulse.   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  D/c home today.  Continue Xarelto at discharge.  Information given on compression stockings. F/u in 4-6 weeks with iliocaval and left lower extremity venous duplexes.   Alvia Grove 07/12/2016 10:34 AM --  Laboratory CBC    Component Value Date/Time   WBC 10.4 07/12/2016 0244   HGB 11.6 (L) 07/12/2016 0244   HCT 34.1 (L) 07/12/2016 0244   PLT 190 07/12/2016 0244    BMET    Component Value Date/Time   NA 139 07/12/2016 0244   K 3.9 07/12/2016 0244   CL 108 07/12/2016 0244   CO2 27 07/12/2016 0244   GLUCOSE 96 07/12/2016 0244   BUN 10 07/12/2016 0244   CREATININE 0.73 07/12/2016 0244   CREATININE 0.53 06/23/2015 1124   CALCIUM 8.7 (L) 07/12/2016 0244   GFRNONAA >60 07/12/2016 0244   GFRAA >60 07/12/2016 0244    COAG Lab Results  Component Value Date   INR 1.16 07/11/2016   INR 0.94 07/11/2016   No results found for: PTT  Antibiotics Anti-infectives    Start     Dose/Rate Route Frequency Ordered Stop   07/11/16 1137  cefUROXime (ZINACEF) 1.5 g in dextrose 5 % 50 mL IVPB     1.5 g 100 mL/hr over 30 Minutes Intravenous 30 min pre-op 07/11/16 1137 07/11/16 [place], PA-C [facility] Office:  [phone] Pager: [phone] 07/12/2016 10:34 AM

## 2016-07-12 NOTE — Progress Notes (Signed)
[place] for Heparin Indication: DVT  Allergies  Allergen Reactions  . No Known Allergies     Patient Measurements: Height: 5\' 2"  (157.5 cm) Weight: 135 lb (61.2 kg) IBW/kg (Calculated) : 50.1   Vital Signs: Temp: 99.3 F (37.4 C) (10/19 0801) Temp Source: Oral (10/19 0801) BP: 97/52 (10/19 0801) Pulse Rate: 78 (10/19 0801)  Labs:  Recent Labs  07/11/16 1218  07/11/16 1250 07/11/16 2126 07/12/16 0244 07/12/16 0815  HGB  --   < > 16.0* 11.9* 11.6*  --   HCT  --   --  47.0* 34.2* 34.1*  --   PLT  --   --   --  177 190  --   APTT 31  --   --   --   --   --   LABPROT 12.5  --   --  14.8  --   --   INR 0.94  --   --  1.16  --   --   HEPARINUNFRC  --   --   --   --  0.16* 0.30  CREATININE  --   --   --  0.80 0.73  --   < > = values in this interval not displayed.  Estimated Creatinine Clearance: 90.1 mL/min (by C-G formula based on SCr of 0.73 mg/dL).   Medical History: Past Medical History:  Diagnosis Date  . Anemia    7-8 years ago (as of 06/2016)  . Asthma   . DVT (deep venous thrombosis) ([place])   . History of kidney stones      Assessment: 30yof admitted with recurrent DVT s/p thrombectomy.  Plan to start heparin drip.  CBC stable, No bleeding,   HL therapeutic at 0.3  Goal of Therapy:  Heparin level 0.3-0.7 units/ml Monitor platelets by anticoagulation protocol: Yes   Plan:  Increase heparin to 850 unts/hr Daily HL, CBC  Thank you Anette Guarneri, PharmD [phone] 07/12/2016 10:04 AM

## 2016-07-12 NOTE — Progress Notes (Signed)
ANTICOAGULATION CONSULT NOTE - Follow-up Consult  Pharmacy Consult for Heparin Indication: DVT  Allergies  Allergen Reactions  . No Known Allergies     Patient Measurements: Height: 5\' 2"  (157.5 cm) Weight: 135 lb (61.2 kg) IBW/kg (Calculated) : 50.1   Vital Signs: Temp: 98.6 F (37 C) (10/19 0408) Temp Source: Oral (10/19 0408) BP: 100/51 (10/19 0408) Pulse Rate: 77 (10/19 0408)  Labs:  Recent Labs  07/11/16 1218  07/11/16 1250 07/11/16 2126 07/12/16 0244  HGB  --   < > 16.0* 11.9* 11.6*  HCT  --   --  47.0* 34.2* 34.1*  PLT  --   --   --  177 190  APTT 31  --   --   --   --   LABPROT 12.5  --   --  14.8  --   INR 0.94  --   --  1.16  --   HEPARINUNFRC  --   --   --   --  0.16*  CREATININE  --   --   --  0.80 0.73  < > = values in this interval not displayed.  Estimated Creatinine Clearance: 90.1 mL/min (by C-G formula based on SCr of 0.73 mg/dL).   Assessment: 30yof admitted with recurrent DVT s/p thrombectomy. Pt on heparin gtt. Heparin level subtherapeutic (0.16) on gtt at 800 units/hr. Heparin level drawn ~0300 hours post gtt start so hard to assess. No bleeding reported. CBC stable.  Goal of Therapy:  Heparin level 0.3-0.7 units/ml Monitor platelets by anticoagulation protocol: Yes   Plan:  Continue heparin drip at 800 units/hr F/u heparin level at 0700  Sherlon Handing, PharmD, BCPS Clinical pharmacist, pager [phone] 07/12/2016 4:29 AM

## 2016-07-13 ENCOUNTER — Encounter (HOSPITAL_COMMUNITY): Payer: Self-pay

## 2016-07-13 ENCOUNTER — Emergency Department (HOSPITAL_COMMUNITY)
Admission: EM | Admit: 2016-07-13 | Discharge: 2016-07-13 | Disposition: A | Discharge status: Home / Self Care | Payer: Medicaid Other | Attending: Emergency Medicine | Admitting: Emergency Medicine

## 2016-07-13 DIAGNOSIS — J45909 Unspecified asthma, uncomplicated: Secondary | ICD-10-CM | POA: Diagnosis not present

## 2016-07-13 DIAGNOSIS — Z7901 Long term (current) use of anticoagulants: Secondary | ICD-10-CM | POA: Diagnosis not present

## 2016-07-13 DIAGNOSIS — R21 Rash and other nonspecific skin eruption: Secondary | ICD-10-CM | POA: Diagnosis present

## 2016-07-13 DIAGNOSIS — N186 End stage renal disease: Secondary | ICD-10-CM | POA: Diagnosis not present

## 2016-07-13 DIAGNOSIS — T7840XA Allergy, unspecified, initial encounter: Secondary | ICD-10-CM

## 2016-07-13 MED ORDER — PREDNISONE 10 MG PO TABS: 50.0000 mg | ORAL_TABLET | Freq: Every day | ORAL | 0 refills | Status: DC

## 2016-07-13 MED ORDER — EPINEPHRINE 0.3 MG/0.3ML IJ SOAJ: 0.3000 mg | Freq: Once | INTRAMUSCULAR | 1 refills | Status: AC

## 2016-07-13 MED ORDER — OXYCODONE-ACETAMINOPHEN 5-325 MG PO TABS
1.0000 | ORAL_TABLET | Freq: Once | ORAL | Status: AC
  Administered 2016-07-13: 1 via ORAL
  Filled 2016-07-13: qty 1

## 2016-07-13 MED ORDER — METHYLPREDNISOLONE SODIUM SUCC 125 MG IJ SOLR
125.0000 mg | Freq: Once | INTRAMUSCULAR | Status: AC
  Administered 2016-07-13: 125 mg via INTRAVENOUS
  Filled 2016-07-13: qty 2

## 2016-07-13 MED ORDER — LORATADINE 10 MG PO TABS: 10.0000 mg | ORAL_TABLET | Freq: Every day | ORAL | 0 refills | Status: DC

## 2016-07-13 MED ORDER — DIPHENHYDRAMINE HCL 50 MG/ML IJ SOLN
25.0000 mg | Freq: Once | INTRAMUSCULAR | Status: AC
  Administered 2016-07-13: 25 mg via INTRAVENOUS
  Filled 2016-07-13: qty 1

## 2016-07-13 MED ORDER — EPINEPHRINE 0.3 MG/0.3ML IJ SOAJ
0.3000 mg | Freq: Once | INTRAMUSCULAR | Status: AC
  Administered 2016-07-13: 0.3 mg via INTRAMUSCULAR
  Filled 2016-07-13: qty 0.3

## 2016-07-13 MED ORDER — EPINEPHRINE 0.3 MG/0.3ML IJ SOAJ: 0.3000 mg | Freq: Once | INTRAMUSCULAR | Status: DC

## 2016-07-13 MED ORDER — OXYCODONE-ACETAMINOPHEN 5-325 MG PO TABS: 1.0000 | ORAL_TABLET | ORAL | 0 refills | Status: DC | PRN

## 2016-07-13 MED ORDER — SODIUM CHLORIDE 0.9 % IV BOLUS (SEPSIS)
1000.0000 mL | Freq: Once | INTRAVENOUS | Status: AC
  Administered 2016-07-13: 1000 mL via INTRAVENOUS

## 2016-07-13 MED ORDER — RANITIDINE HCL 150 MG/10ML PO SYRP
150.0000 mg | ORAL_SOLUTION | Freq: Once | ORAL | Status: AC
  Administered 2016-07-13: 150 mg via ORAL
  Filled 2016-07-13: qty 10

## 2016-07-13 NOTE — Discharge Planning (Signed)
EDCM reviewed discharging chart for possible CM needs.  No needs identified.    

## 2016-07-13 NOTE — ED Provider Notes (Signed)
Pt has had rash and itching since leaving the hospital - she has SOB last night - that is improved and now has pain and rash which is diffuse - she has had controlled pain until this point but has had surgical site pain this AM - wounds look well - pt has no fever, diffuse urticarial rash - clear OP and normal VS and normal appearing airway, steroids, antihistamines, observation in ED for short period prior to determining disposition.  Pt in agreement.  Medical screening examination/treatment/procedure(s) were conducted as a shared visit with non-physician practitioner(s) and myself.  I personally evaluated the patient during the encounter.  Clinical Impression:   Final diagnoses:  None         Noemi Chapel, MD 07/15/16 [phone]

## 2016-07-13 NOTE — ED Provider Notes (Signed)
[facility] DEPT Provider Note   CSN: [id number] Arrival date & time: 07/13/16  9326     History   Chief Complaint Chief Complaint  Patient presents with  . Rash    HPI FREDI Warner is a 30 y.o. female.  HPI   30 year old female presents today with complaints of rash. Patient reports that she has a history of blood clots was taking Xarelto and developed left hip pain. She seen in the emergency room admitted to the hospital for thrombectomy. Patient went under general anesthesia on the 18th ( 2 days ago). She had no complications, was receiving cefuroxime both on the 18th. Patient reports that yesterday prior to discharge she started feeling itchy had no rash. She notes that she had one episode of vomiting. After getting home she noticed spreading redness to entire body and severe itchiness. Patient reports yesterday she felt slightly dizzy and short of breath, reports that she no longer has any chest pain, shortness of breath, dizziness, swelling of the tongue mouth or throat. Patient reports she continues to takes Xarelto and oxycodone, she notes that she has been on Xarelto for approximate 6 months and has taken oxycodone in the past. Patient denies any new lotion soaps or any other body care products, does not take any over-the-counter medications. She has no history of allergic reactions.  Past Medical History:  Diagnosis Date  . Anemia    7-8 years ago (as of 06/2016)  . Asthma   . DVT (deep venous thrombosis) ([place])   . History of kidney stones     Patient Active Problem List   Diagnosis Date Noted  . 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 ESRD (end stage renal disease) ([place]) 07/11/2016  . DVT (deep venous thrombosis) ([place]) 07/11/2016  . Nexplanon insertion 04/18/2016  . DVT of lower extremity (deep venous thrombosis) ([place]) 01/24/2016  . High risk sexual behavior 08/04/2015    Past Surgical History:  Procedure Laterality Date  . CESAREAN SECTION    . INDUCED  ABORTION    . LEG SURGERY     Pt had DVT removed   . LOWER EXTREMITY ANGIOGRAM Left 07/11/2016   Procedure: Percutaneous Venous Thrombectomy with IVUS and  with Stent Placement;  Surgeon: Serafina Mitchell, MD;  Location: [facility] OR;  Service: Vascular;  Laterality: Left;    OB History    Gravida Para Term Preterm AB Living   3 1     1 2    SAB TAB Ectopic Multiple Live Births     1   0 2       Home Medications    Prior to Admission medications   Medication Sig Start Date End Date Taking? Authorizing Provider  Etonogestrel (NEXPLANON [place]) Inject into the skin.   Yes Historical Provider, MD  rivaroxaban (XARELTO) 20 MG TABS tablet Take 1 tablet (20 mg total) by mouth daily with supper. 07/12/16  Yes Alvia Grove, PA-C  EPINEPHrine 0.3 mg/0.3 mL IJ SOAJ injection Inject 0.3 mLs (0.3 mg total) into the muscle once. 07/13/16 07/13/16  Dellis Filbert Eula Jaster, PA-C  loratadine (CLARITIN) 10 MG tablet Take 1 tablet (10 mg total) by mouth daily. 07/13/16   Okey Regal, PA-C  oxyCODONE-acetaminophen (PERCOCET/ROXICET) 5-325 MG tablet Take 1 tablet by mouth every 4 (four) hours as needed for severe pain. 07/13/16   Okey Regal, PA-C  predniSONE (DELTASONE) 10 MG tablet Take 5 tablets (50 mg total) by mouth daily. 07/13/16   Okey Regal, PA-C    Family History  Family History  Problem Relation Age of Onset  . Heart murmur Mother   . Asthma Mother   . Clotting disorder Father   . Heart disease Father     Social History Social History  Substance Use Topics  . Smoking status: Never Smoker  . Smokeless tobacco: Never Used  . Alcohol use No     Allergies   Zinacef [cefuroxime]   Review of Systems Review of Systems  All other systems reviewed and are negative.   Physical Exam Updated Vital Signs BP 132/65 (BP Location: Right Arm)   Pulse 103   Temp 98.3 F (36.8 C) (Oral)   Resp 23   Ht 5\' 2"  (1.575 m)   Wt 61.2 kg   SpO2 98%   BMI 24.69 kg/m   Physical Exam    Constitutional: She is oriented to person, place, and time. She appears well-developed and well-nourished.  HENT:  Head: Normocephalic and atraumatic.  Eyes: Conjunctivae are normal. Pupils are equal, round, and reactive to light. Right eye exhibits no discharge. Left eye exhibits no discharge. No scleral icterus.  Neck: Normal range of motion. No JVD present. No tracheal deviation present.  Cardiovascular: Regular rhythm, normal heart sounds and intact distal pulses.  Exam reveals no friction rub.   No murmur heard. Pulmonary/Chest: Effort normal and breath sounds normal. No stridor. No respiratory distress. She has no wheezes. She has no rales. She exhibits no tenderness.  Neurological: She is alert and oriented to person, place, and time. Coordination normal.  Skin: Skin is warm. Rash noted.  Diffuse allergic reaction   Psychiatric: She has a normal mood and affect. Her behavior is normal. Judgment and thought content normal.  Nursing note and vitals reviewed.    ED Treatments / Results  Labs (all labs ordered are listed, but only abnormal results are displayed) Labs Reviewed - No data to display  EKG  EKG Interpretation None       Radiology No results found.  Procedures Procedures (including critical care time)  Medications Ordered in ED Medications  diphenhydrAMINE (BENADRYL) injection 25 mg (25 mg Intravenous Given 07/13/16 1018)  ranitidine (ZANTAC) 150 MG/10ML syrup 150 mg (150 mg Oral Given 07/13/16 1018)  sodium chloride 0.9 % bolus 1,000 mL (0 mLs Intravenous Stopped 07/13/16 1137)  methylPREDNISolone sodium succinate (SOLU-MEDROL) 125 mg/2 mL injection 125 mg (125 mg Intravenous Given 07/13/16 1137)  oxyCODONE-acetaminophen (PERCOCET/ROXICET) 5-325 MG per tablet 1 tablet (1 tablet Oral Given 07/13/16 1226)  EPINEPHrine (EPI-PEN) injection 0.3 mg (0.3 mg Intramuscular Given 07/13/16 1242)     Initial Impression / Assessment and Plan / ED Course  I have  reviewed the triage vital signs and the nursing notes.  Pertinent labs & imaging results that were available during my care of the patient were reviewed by me and considered in my medical decision making (see chart for details).  Clinical Course     Final Clinical Impressions(s) / ED Diagnoses   Final diagnoses:  Allergic reaction, initial encounter    Labs:  Imaging:  Consults:  Therapeutics:  Discharge Meds:   Assessment/Plan:   30 year old female presents today with what appears to be allergic type reaction. Patient has had multiple new exposures over the last several days, question antibiotics versus medications administered with anesthesia and procedure. Patient has no signs of anaphylactic reaction on my exam, she does have diffuse rash. She has clear lung sounds no chest pain dizziness nausea vomiting. Patient tolerating by mouth here, she was given H1  and H2 antihistamines and steroids. She reports improvement in the itching, continues to have rash. Patient will be evaluated with likely disposition home on prednisone and close follow-up with primary care provider.  Patient persistent rash and itchiness, we will administer epinephrine here with close observation.  Patient's symptoms dramatically improved, still minor rash, continues to have no chest pain shortness of breath, or signs of anaphylaxis. Patient discharged home with prednisone, Claritin, and EpiPen as needed for severe signs or symptoms. Close follow-up with primary care for allergy testing to my strict impressions given.   New Prescriptions New Prescriptions   EPINEPHRINE 0.3 MG/0.3 ML IJ SOAJ INJECTION    Inject 0.3 mLs (0.3 mg total) into the muscle once.   LORATADINE (CLARITIN) 10 MG TABLET    Take 1 tablet (10 mg total) by mouth daily.   OXYCODONE-ACETAMINOPHEN (PERCOCET/ROXICET) 5-325 MG TABLET    Take 1 tablet by mouth every 4 (four) hours as needed for severe pain.   PREDNISONE (DELTASONE) 10 MG  TABLET    Take 5 tablets (50 mg total) by mouth daily.     Okey Regal, PA-C 07/13/16 [place], MD 07/15/16 [phone]

## 2016-07-13 NOTE — ED Triage Notes (Signed)
Per Pt, Pt had a surgery yesterday to remove blood clot in the left leg. Pt reports getting home and noticing a macular rash across her chest, abdomen, legs, and arms associated with pruritis. Pt reports it has not gotten any better. Also complains of some tingling in her left foot.

## 2016-07-13 NOTE — ED Notes (Signed)
Felt better at first but got up to use bathroom and she started to itch and she has red welps on her legs

## 2016-07-13 NOTE — Discharge Instructions (Signed)
Please use prednisone and Claritin as directed. Please monitor for new or worsening signs or symptoms, return the emergency room immediately if you experience any. Please follow-up with your primary care provider inform them of today's visit and all relevant data. Please request allergy testing through your primary care provider.

## 2016-07-17 ENCOUNTER — Telehealth: Payer: Self-pay | Admitting: Surgery

## 2016-07-17 NOTE — Telephone Encounter (Signed)
LVM on home # for [place] on 11/20 and F/U on 11/27  Mailed letter

## 2016-07-17 NOTE — Discharge Summary (Signed)
[facility] Discharge Summary  Paula Warner [birth year] 30 y.o. 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   Hospital Course:  The patient was admitted to the hospital and taken to the operating room on 07/11/2016 and underwent:  #1:  U/s guided access, left popliteal vein #2:  Ilio-caval and left leg venogram #3:  IVUS, Left femoral, common femoral, external, common iliac vein, and inferior vena cava #4:  Angiojet mechanical thrombectomy left femoral, common femoral, external iliac and common iliac vein #5:  Stent left common femoral, external, and common iliac vein #6:  Angioplasty left femoral vein    The patient tolerated the procedure well and was transported to the PACU in stable condition.   Post-operatively, the patient was doing well. She had some soreness behind her left knee from the cannulation site, but there was not hematoma. She had a palpable left DP pulse. She was continued on Xarelto at discharge. She was given information on compression stockings. She was discharged home on POD 1 in good condition.      CBC    Component Value Date/Time   WBC 10.4 07/12/2016 0244   RBC 3.63 (L) 07/12/2016 0244   HGB 11.6 (L) 07/12/2016 0244   HCT 34.1 (L) 07/12/2016 0244   PLT 190 07/12/2016 0244   MCV 93.9 07/12/2016 0244   MCH 32.0 07/12/2016 0244   MCHC 34.0 07/12/2016 0244   RDW 12.5 07/12/2016 0244   LYMPHSABS 1.7 01/20/2016 1300   MONOABS 1.2 (H) 01/20/2016 1300   EOSABS 0.1 01/20/2016 1300   BASOSABS 0.0 01/20/2016 1300    BMET    Component Value Date/Time   NA 139 07/12/2016 0244   K 3.9 07/12/2016 0244   CL 108 07/12/2016 0244   CO2 27 07/12/2016 0244   GLUCOSE 96 07/12/2016 0244   BUN 10 07/12/2016 0244   CREATININE 0.73 07/12/2016 0244   CREATININE 0.53  06/23/2015 1124   CALCIUM 8.7 (L) 07/12/2016 0244   GFRNONAA >60 07/12/2016 0244   GFRAA >60 07/12/2016 0244     Discharge Instructions:   The patient is discharged to home with extensive instructions on wound care and progressive ambulation.  They are instructed not to drive or perform any heavy lifting until returning to see the physician in his office.  Discharge Instructions    Call MD for:  redness, tenderness, or signs of infection (pain, swelling, bleeding, redness, odor or green/yellow discharge around incision site)    Complete by:  As directed    Call MD for:  severe or increased pain, loss or decreased feeling  in affected limb(s)    Complete by:  As directed    Call MD for:  temperature >100.5    Complete by:  As directed    Discharge instructions    Complete by:  As directed    Wear your compression stockings as soon as you get out of bed. You do not have to wear them at night. Keep your legs elevated above the level of the heart when not walking.   Discharge wound care:    Complete by:  As directed    You may take off your dressing behind the left knee tomorrow. Continue to use the ACE wrap until you buy compression stockings.   May shower     Complete  by:  As directed    Resume previous diet    Complete by:  As directed       Discharge Diagnosis:  DVT  Secondary Diagnosis: Patient Active Problem List   Diagnosis Date Noted  . 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 ESRD (end stage renal disease) ([place]) 07/11/2016  . DVT (deep venous thrombosis) ([place]) 07/11/2016  . Nexplanon insertion 04/18/2016  . DVT of lower extremity (deep venous thrombosis) ([place]) 01/24/2016  . High risk sexual behavior 08/04/2015   Past Medical History:  Diagnosis Date  . Anemia    7-8 years ago (as of 06/2016)  . Asthma   . DVT (deep venous thrombosis) ([place])   . History of kidney stones        Medication List    STOP taking these medications   enoxaparin 100 MG/ML  injection Commonly known as:  LOVENOX     TAKE these medications   NEXPLANON [place] Inject into the skin.   rivaroxaban 20 MG Tabs tablet Commonly known as:  XARELTO Take 1 tablet (20 mg total) by mouth daily with supper.       Disposition: Home  Patient's condition: is Good  Follow up: 1. Dr. Trula Slade in 4-6 weeks   Virgina Jock, PA-C [facility] [phone] 07/17/2016  12:08 PM

## 2016-07-17 NOTE — Telephone Encounter (Signed)
-----   Message from Mena Goes, RN sent at 07/12/2016 10:30 AM EDT ----- Regarding: may be duplicate   ----- Message ----- From: Alvia Grove, PA-C Sent: 07/12/2016  10:16 AM To: Vvs Charge Pool  S/p Percutaneous Venous Thrombectomy with IVUS and  with Stent Placement (Left) 07/11/16  F/u with Dr. Trula Slade in 4-6 weeks with IVC and iliac venous duplex and left leg venous duplex.  Thanks Maudie Mercury

## 2016-08-01 ENCOUNTER — Ambulatory Visit (INDEPENDENT_AMBULATORY_CARE_PROVIDER_SITE_OTHER): Payer: Medicaid Other | Admitting: Certified Nurse Midwife

## 2016-08-01 VITALS — BP 115/83 | HR 65 | Wt 138.0 lb

## 2016-08-01 DIAGNOSIS — Z3046 Encounter for surveillance of implantable subdermal contraceptive: Secondary | ICD-10-CM

## 2016-08-02 ENCOUNTER — Encounter: Payer: Self-pay | Admitting: Certified Nurse Midwife

## 2016-08-02 NOTE — Progress Notes (Signed)
Procedure Note Removal of Nexplanon, Has had recurrent DVT on treatement.  Recent DVT surgery on 07/11/16.  Desires removal d/t DVT history.  Family hx of Father and brother DVTs as well.    Patient had Nexplanon inserted in 04/18/16. Desires removal today.   Reviewed risk and benefits of procedure. Alternative options discussed Patient reported understanding and agreed to continue.   The patient's left arm was palpated and the implant device located. The area was prepped with Betadinex3. The distal end of the device was palpated and 1 cc of 1% lidocaine with epinephrine was injected. A 2 mm incision was made. Any fibrotic tissue was carefully dissected away using blunt and/or sharp dissection. Over the tip and the tip was exposed, grasped with forcep and removed intact. Steri-strips and a sterile dressing were applied to the incision.   And a bandage applied and the arm was wrapped with gauze bandage.  The patient tolerated well.  Instructions:  The patient was instructed to remove the dressing in 24 hours and that some bruising is to be expected.  She was advised to use over the counter analgesics as needed for any pain at the site.  She is to keep the area dry for 24 hours and to call if her hand or arm becomes cold, numb, or blue.  Return visit:  Return in 4 weeks Patient plans condoms, paragard IUD  Kandis Cocking CNM

## 2016-08-07 ENCOUNTER — Other Ambulatory Visit: Payer: Self-pay | Admitting: *Deleted

## 2016-08-07 DIAGNOSIS — Z9862 Peripheral vascular angioplasty status: Secondary | ICD-10-CM

## 2016-08-07 DIAGNOSIS — I872 Venous insufficiency (chronic) (peripheral): Secondary | ICD-10-CM

## 2016-08-07 DIAGNOSIS — I82419 Acute embolism and thrombosis of unspecified femoral vein: Secondary | ICD-10-CM

## 2016-08-08 ENCOUNTER — Encounter: Payer: Self-pay | Admitting: Surgery

## 2016-08-13 ENCOUNTER — Encounter (HOSPITAL_COMMUNITY): Payer: Medicaid Other

## 2016-08-13 ENCOUNTER — Encounter: Payer: Self-pay | Admitting: Certified Nurse Midwife

## 2016-08-15 ENCOUNTER — Ambulatory Visit (INDEPENDENT_AMBULATORY_CARE_PROVIDER_SITE_OTHER)
Admission: RE | Admit: 2016-08-15 | Discharge: 2016-08-15 | Disposition: A | Discharge status: Home / Self Care | Payer: Medicaid Other | Source: Ambulatory Visit | Attending: Surgery | Admitting: Surgery

## 2016-08-15 ENCOUNTER — Ambulatory Visit (HOSPITAL_COMMUNITY)
Admission: RE | Admit: 2016-08-15 | Discharge: 2016-08-15 | Disposition: A | Discharge status: Home / Self Care | Payer: Medicaid Other | Source: Ambulatory Visit | Attending: Vascular Surgery | Admitting: Vascular Surgery

## 2016-08-15 ENCOUNTER — Encounter: Payer: Self-pay | Admitting: Surgery

## 2016-08-15 DIAGNOSIS — I872 Venous insufficiency (chronic) (peripheral): Secondary | ICD-10-CM

## 2016-08-15 DIAGNOSIS — Z9862 Peripheral vascular angioplasty status: Secondary | ICD-10-CM

## 2016-08-20 ENCOUNTER — Ambulatory Visit (INDEPENDENT_AMBULATORY_CARE_PROVIDER_SITE_OTHER): Payer: Medicaid Other | Admitting: Surgery

## 2016-08-20 ENCOUNTER — Encounter: Payer: Self-pay | Admitting: Surgery

## 2016-08-20 ENCOUNTER — Ambulatory Visit: Payer: Medicaid Other | Admitting: Surgery

## 2016-08-20 VITALS — BP 106/70 | HR 78 | Temp 98.8°F | Resp 16 | Ht 62.0 in | Wt 140.0 lb

## 2016-08-20 DIAGNOSIS — I82412 Acute embolism and thrombosis of left femoral vein: Secondary | ICD-10-CM | POA: Diagnosis not present

## 2016-08-20 NOTE — Progress Notes (Signed)
[facility] [place]  Patient name: Paula Warner MRN: [id number] DOB: 1986-04-13 Sex: female  REASON FOR VISIT: follow up  HPI: Paula Warner is a 30 y.o. 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  On 1018 she underwent left iliofemoral mechanical thrombectomy using the AngioJet as well as stenting of her left common femoral, external and common iliac vein as well as angioplasty of her left femoral vein.  She remains on anticoagulation.  She states that she no longer has swelling.  She has had her birth control device removed.  Past Medical History:  Diagnosis Date  . Anemia    7-8 years ago (as of 06/2016)  . Asthma   . DVT (deep venous thrombosis) ([place])   . History of kidney stones     Family History  Problem Relation Age of Onset  . Heart murmur Mother   . Asthma Mother   . Clotting disorder Father   . Heart disease Father     SOCIAL HISTORY: Social History  Substance Use Topics  . Smoking status: Never Smoker  . Smokeless tobacco: Never Used  . Alcohol use No    Allergies  Allergen Reactions  . Zinacef [Cefuroxime] Hives, Itching and Rash    Current Outpatient Prescriptions  Medication Sig Dispense Refill  . loratadine (CLARITIN) 10 MG tablet Take 1 tablet (10 mg total) by mouth daily. 30 tablet 0  . predniSONE (DELTASONE) 10 MG tablet Take 5 tablets (50 mg total) by mouth daily. 25 tablet 0  . rivaroxaban (XARELTO) 20 MG TABS tablet Take 1 tablet (20 mg total) by mouth daily with supper. 30 tablet 11  . Etonogestrel (NEXPLANON ) Inject into the skin.    [place] Kitchen oxyCODONE-acetaminophen (PERCOCET/ROXICET) 5-325 MG tablet Take 1 tablet by mouth every 4 (four) hours as needed for severe pain. (Patient not taking: Reported on 08/20/2016) 6 tablet 0   No current facility-administered medications for this visit.     REVIEW OF SYSTEMS:  [X]  denotes positive finding, [ ]  denotes  negative finding Cardiac  Comments:  Chest pain or chest pressure:    Shortness of breath upon exertion:    Short of breath when lying flat:    Irregular heart rhythm:        Vascular    Pain in calf, thigh, or hip brought on by ambulation:    Pain in feet at night that wakes you up from your sleep:     Blood clot in your veins:    Leg swelling:         Pulmonary    Oxygen at home:    Productive cough:     Wheezing:         Neurologic    Sudden weakness in arms or legs:     Sudden numbness in arms or legs:     Sudden onset of difficulty speaking or slurred speech:    Temporary loss of vision in one eye:     Problems with dizziness:         Gastrointestinal    Blood in stool:     Vomited blood:         Genitourinary    Burning when urinating:     Blood in urine:        Psychiatric    Major depression:         Hematologic    Bleeding problems:    Problems with blood clotting too easily:  Skin    Rashes or ulcers:        Constitutional    Fever or chills:      PHYSICAL EXAM: Vitals:   08/20/16 1619  BP: 106/70  Pulse: 78  Resp: 16  Temp: 98.8 F (37.1 C)  TempSrc: Oral  SpO2: 100%  Weight: 140 lb (63.5 kg)  Height: 5\' 2"  (1.575 m)    GENERAL: The patient is a well-nourished female, in no acute distress. The vital signs are documented above. CARDIAC: There is a regular rate and rhythm.  VASCULAR: No swelling noted in either lower extremity PULMONARY: There is good air exchange bilaterally without wheezing or rales. MUSCULOSKELETAL: There are no major deformities or cyanosis. NEUROLOGIC: No focal weakness or paresthesias are detected. SKIN: There are no ulcers or rashes noted. PSYCHIATRIC: The patient has a normal affect.  DATA:  I have ordered and reviewed her venous Doppler study.  This shows that the stent in the left common iliac, external iliac and common femoral vein are occluded.  The vena cava remains widely patent.  There is occlusive  thrombus within the left common femoral vein and partially occlusive thrombus within the distal thigh femoral vein.  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  However, she remains completely asymptomatic.  She does not have any pain in the leg, and she does not have any swelling.  She has been taken off of her birth control medication.  She will remain on anticoagulation.  I will have her follow-up in 6 months.  Given the fact that she is asymptomatic have recommended that we not proceed with recanalization of her stents, and addition the timeframe when they occluded is unknown.    Annamarie Major, MD [facility] [phone] Pager [phone]

## 2016-08-24 ENCOUNTER — Encounter (HOSPITAL_COMMUNITY): Payer: Medicaid Other

## 2016-09-03 ENCOUNTER — Ambulatory Visit: Payer: Medicaid Other | Admitting: Surgery

## 2016-10-13 ENCOUNTER — Emergency Department (HOSPITAL_COMMUNITY): Payer: Medicaid Other

## 2016-10-13 ENCOUNTER — Encounter (HOSPITAL_COMMUNITY): Payer: Self-pay | Admitting: Emergency Medicine

## 2016-10-13 ENCOUNTER — Emergency Department (HOSPITAL_COMMUNITY)
Admission: EM | Admit: 2016-10-13 | Discharge: 2016-10-13 | Disposition: A | Discharge status: Home / Self Care | Payer: Medicaid Other | Attending: Emergency Medicine | Admitting: Emergency Medicine

## 2016-10-13 DIAGNOSIS — K8020 Calculus of gallbladder without cholecystitis without obstruction: Secondary | ICD-10-CM | POA: Diagnosis not present

## 2016-10-13 DIAGNOSIS — K5900 Constipation, unspecified: Secondary | ICD-10-CM | POA: Diagnosis not present

## 2016-10-13 DIAGNOSIS — J45909 Unspecified asthma, uncomplicated: Secondary | ICD-10-CM | POA: Diagnosis not present

## 2016-10-13 DIAGNOSIS — N186 End stage renal disease: Secondary | ICD-10-CM | POA: Diagnosis not present

## 2016-10-13 DIAGNOSIS — R1031 Right lower quadrant pain: Secondary | ICD-10-CM | POA: Diagnosis present

## 2016-10-13 DIAGNOSIS — Z7901 Long term (current) use of anticoagulants: Secondary | ICD-10-CM | POA: Diagnosis not present

## 2016-10-13 DIAGNOSIS — N201 Calculus of ureter: Secondary | ICD-10-CM

## 2016-10-13 DIAGNOSIS — N132 Hydronephrosis with renal and ureteral calculous obstruction: Secondary | ICD-10-CM | POA: Diagnosis not present

## 2016-10-13 LAB — CBC
HCT: 41.7 % (ref 36.0–46.0)
HEMOGLOBIN: 14 g/dL (ref 12.0–15.0)
MCH: 31.5 pg (ref 26.0–34.0)
MCHC: 33.6 g/dL (ref 30.0–36.0)
MCV: 93.7 fL (ref 78.0–100.0)
PLATELETS: 222 10*3/uL (ref 150–400)
RBC: 4.45 MIL/uL (ref 3.87–5.11)
RDW: 12.6 % (ref 11.5–15.5)
WBC: 9.3 10*3/uL (ref 4.0–10.5)

## 2016-10-13 LAB — URINALYSIS, ROUTINE W REFLEX MICROSCOPIC
Bacteria, UA: NONE SEEN
Bilirubin Urine: NEGATIVE
Glucose, UA: NEGATIVE mg/dL
KETONES UR: NEGATIVE mg/dL
Nitrite: NEGATIVE
PROTEIN: 100 mg/dL — AB
Specific Gravity, Urine: 1.026 (ref 1.005–1.030)
pH: 6 (ref 5.0–8.0)

## 2016-10-13 LAB — POC URINE PREG, ED: Preg Test, Ur: NEGATIVE

## 2016-10-13 LAB — BASIC METABOLIC PANEL
Anion gap: 12 (ref 5–15)
BUN: 13 mg/dL (ref 6–20)
CALCIUM: 9.7 mg/dL (ref 8.9–10.3)
CO2: 25 mmol/L (ref 22–32)
CREATININE: 0.6 mg/dL (ref 0.44–1.00)
Chloride: 102 mmol/L (ref 101–111)
Glucose, Bld: 78 mg/dL (ref 65–99)
Potassium: 3.4 mmol/L — ABNORMAL LOW (ref 3.5–5.1)
SODIUM: 139 mmol/L (ref 135–145)

## 2016-10-13 IMAGING — CT CT RENAL STONE PROTOCOL
2 of 4 series · 15 of 46 positions shown, 17 images · non-contrast
Comparison: CT scan [DATE]

CLINICAL DATA: Right flank pain, history of stones

EXAM:
CT ABDOMEN AND PELVIS WITHOUT CONTRAST
TECHNIQUE: Multidetector CT imaging of the abdomen and pelvis was performed
following the standard protocol without IV contrast.

[Series 4: renal stone 3.0 cor · coronal · 0.65mm/px · 3 of 79 slices shown]
[im 27/79  soft-tissue]
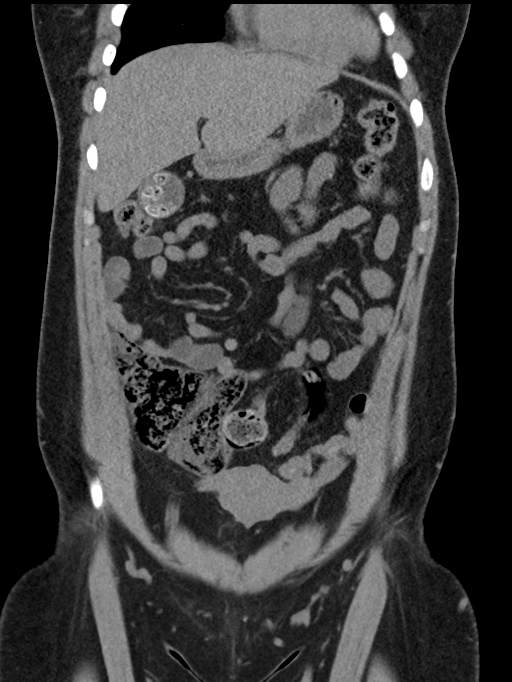
[im 35/79  soft-tissue]
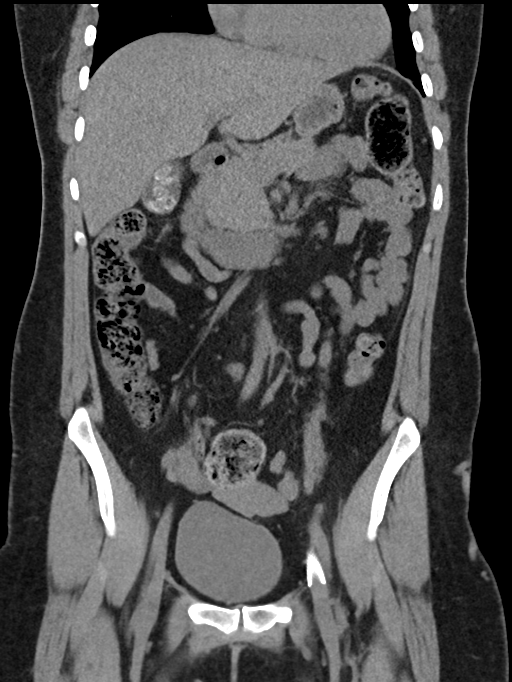
[im 44/79  soft-tissue]
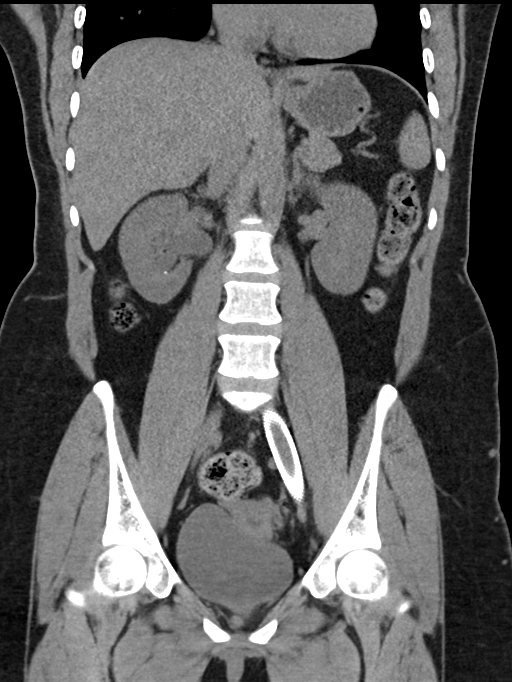

[Series 6: renal stone 5mm · axial · 0.81mm/px · z∈[+902,+1292]mm · 12 of 88 slices shown, 14 images]
[im 5/88  soft-tissue]
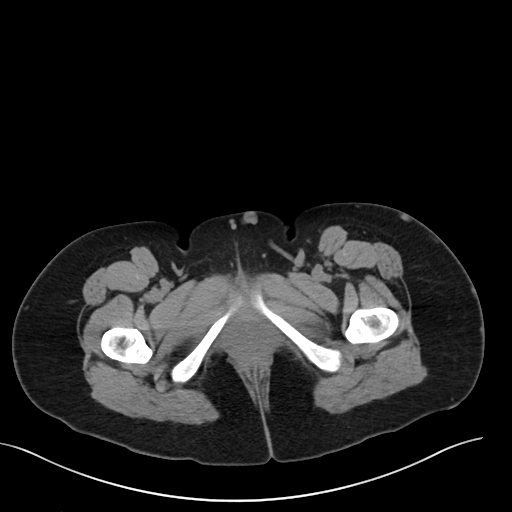
[im 5/88  bone]
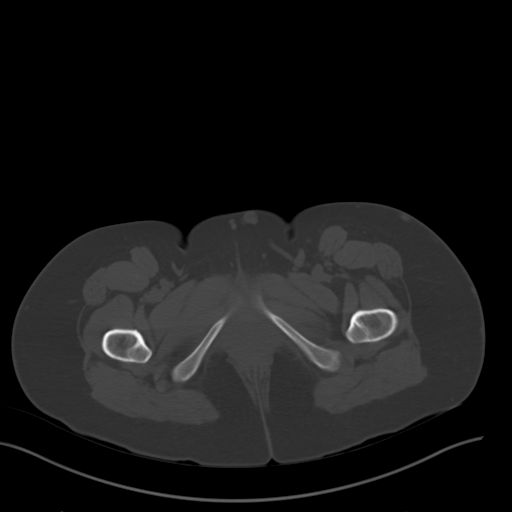
[im 13/88  soft-tissue]
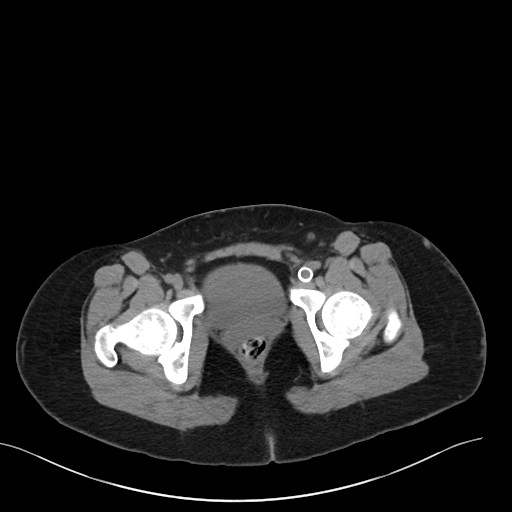
[im 21/88  soft-tissue]
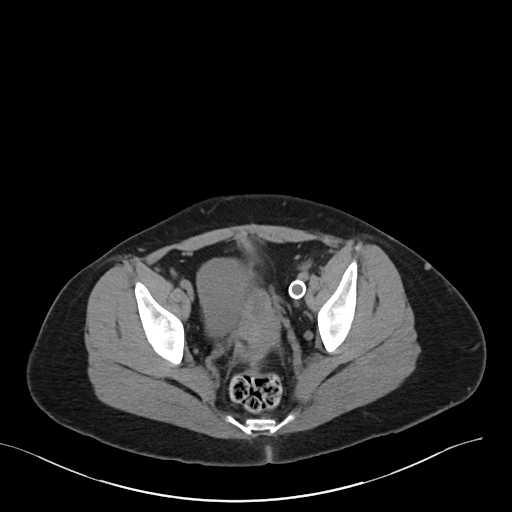
[im 25/88  soft-tissue]
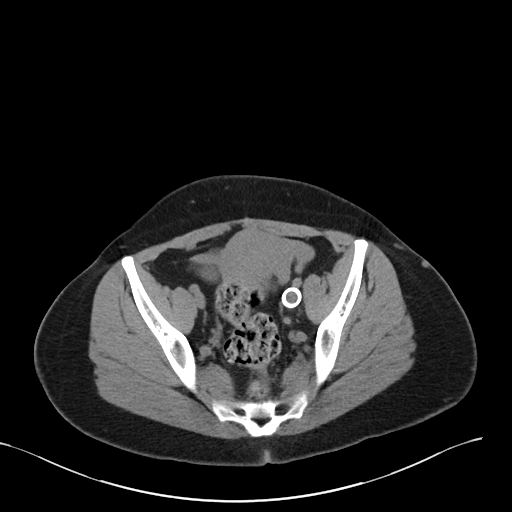
[im 34/88  soft-tissue]
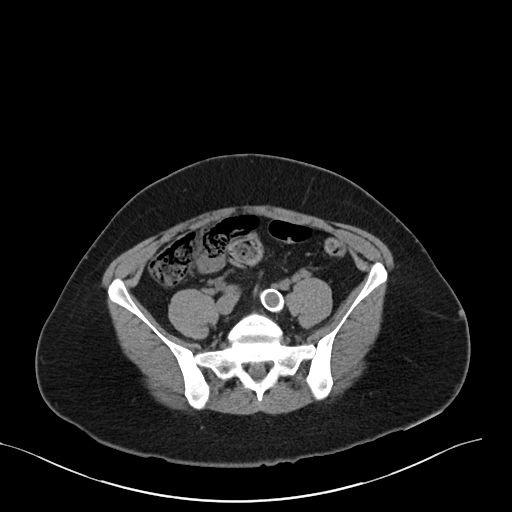
[im 42/88  soft-tissue]
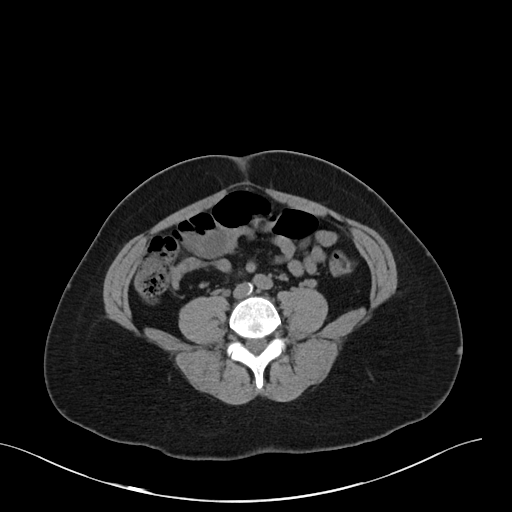
[im 46/88  soft-tissue]
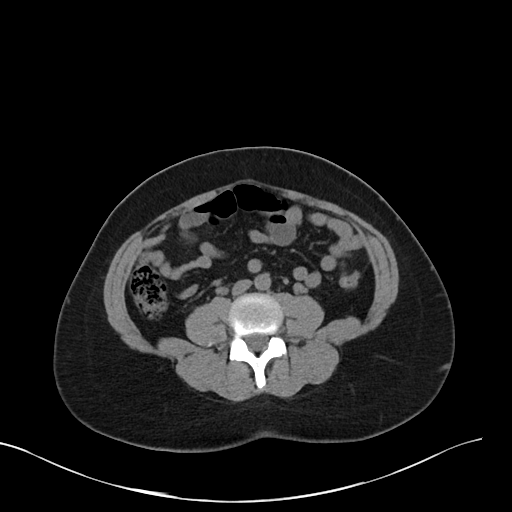
[im 54/88  soft-tissue]
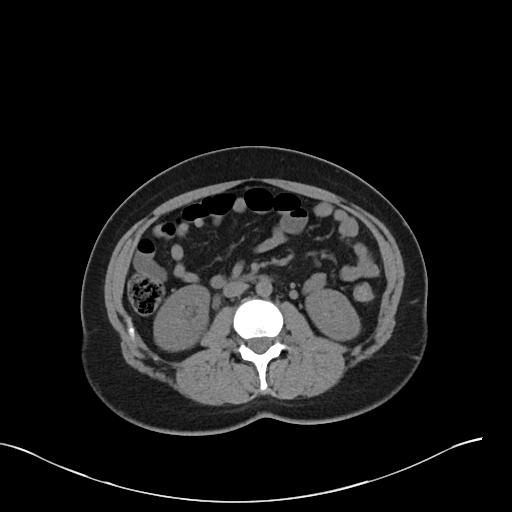
[im 63/88  soft-tissue]
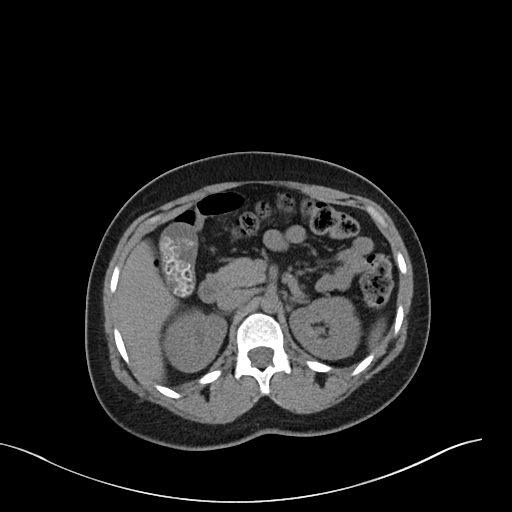
[im 63/88  bone]
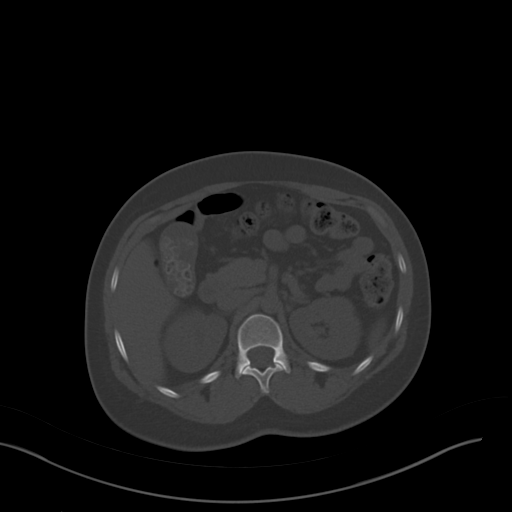
[im 67/88  soft-tissue]
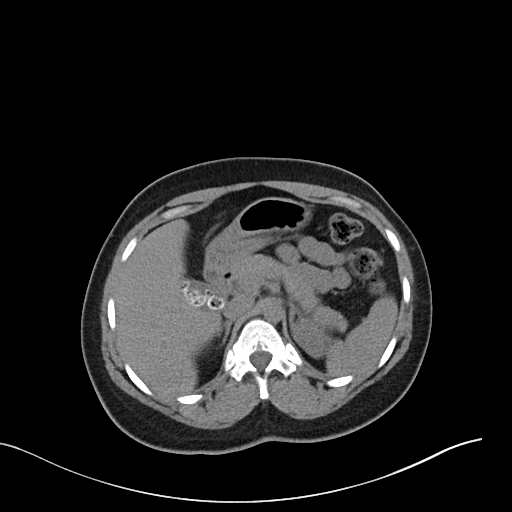
[im 75/88  soft-tissue]
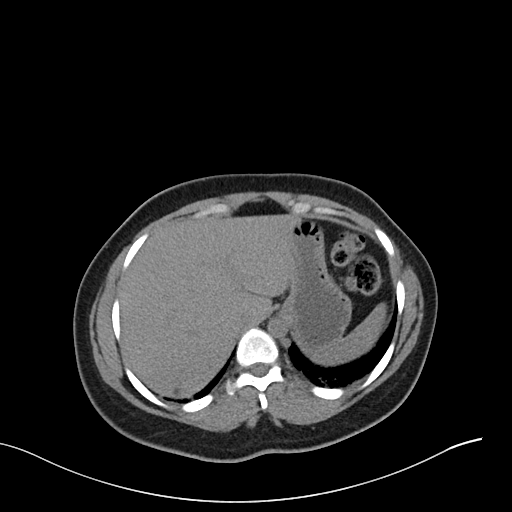
[im 83/88  soft-tissue]
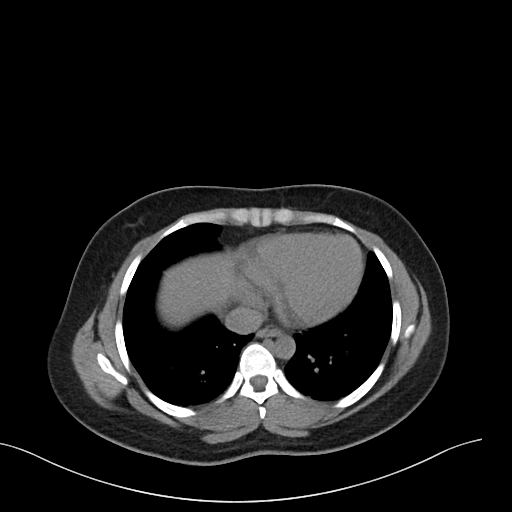

[15 of 46 positions shown; findings below may reference images not displayed]

FINDINGS: Lower chest: No acute findings.  Small hiatal hernia.

Hepatobiliary: Unenhanced liver shows no biliary ductal dilatation.
Stable subcapsular lipoma posterior aspect of the right hepatic lobe
measures about 8 mm. Again noted multiple calcified gallstones
within gallbladder the largest measures 1 cm.

Pancreas: Unenhanced pancreas with normal appearance.

Spleen: Normal unenhanced spleen.

Adrenals/Urinary Tract: Nonobstructive calcified calculus in midpole
of the left kidney measures 5 mm. No adrenal gland mass. At least 3
nonobstructive calcified calculi are noted within right kidney the
largest measures 2.4 mm. There is minimal right hydronephrosis and
proximal right hydroureter. Axial image 59 there is 4 mm calcified
partially obstructive calculus in distal right ureter within right
pelvis about 4 cm from right UVJ. Bilateral distal ureter is small
caliber. No calcified calculi are noted within urinary bladder.

Stomach/Bowel: There is no small bowel obstruction. No thickened or
dilated small bowel loops. Moderate stool noted within right colon
and transverse colon. Moderate to abundant stool noted within cecum.
There is no pericecal inflammation. The appendix is partially
visualized in axial image 57 and coronal image 33. Normal
appearance. There is abundant stool in distal sigmoid colon. Distal
sigmoid colon measures about 4.7 cm in diameter suspicious for mild
fecal impaction. Some stool noted within rectum.

Vascular/Lymphatic: No aortic aneurysm. There is a stent in the left
common and left external iliac vein. No adenopathy is noted.

Reproductive: Anteflexed normal appearing uterus.  No adnexal mass.

Other: No ascites or free abdominal air.

Musculoskeletal: No destructive bony lesions are noted. Sagittal
images of the spine are unremarkable.
IMPRESSION: 1. Bilateral nonobstructive nephrolithiasis. There is mild right
hydronephrosis and minimal proximal right hydroureter.
2. There is 4 mm calcified partial obstructive calculus in distal
right ureter about 4 cm from right UVJ.
3. No calcified calculi are noted within urinary bladder.
4. Moderate stool noted within right colon and transverse colon.
Abundant stool noted within cecum. No pericecal inflammation. Normal
appendix partially visualized. Moderate to abundant stool within
distal sigmoid colon. Mild distal fecal impaction cannot be
excluded.
5. No small bowel obstruction.
6. There is a stent in left common and left external iliac vein.
7. No adnexal mass.  Normal appearing anteflexed uterus.
8. Multiple gallstones are again noted within gallbladder. The
largest measures 1 cm. No pericholecystic fluid or gallbladder
distention.

## 2016-10-13 MED ORDER — NAPROXEN 500 MG PO TABS: 500.0000 mg | ORAL_TABLET | Freq: Two times a day (BID) | ORAL | 0 refills | Status: DC

## 2016-10-13 MED ORDER — HYDROCODONE-ACETAMINOPHEN 5-325 MG PO TABS: 1.0000 | ORAL_TABLET | ORAL | 0 refills | Status: DC | PRN

## 2016-10-13 MED ORDER — CIPROFLOXACIN IN D5W 400 MG/200ML IV SOLN
400.0000 mg | Freq: Two times a day (BID) | INTRAVENOUS | Status: DC
  Administered 2016-10-13: 400 mg via INTRAVENOUS
  Filled 2016-10-13: qty 200

## 2016-10-13 MED ORDER — HYDROMORPHONE HCL 2 MG/ML IJ SOLN
1.0000 mg | Freq: Once | INTRAMUSCULAR | Status: AC
  Administered 2016-10-13: 1 mg via INTRAVENOUS
  Filled 2016-10-13: qty 1

## 2016-10-13 MED ORDER — SODIUM CHLORIDE 0.9 % IV SOLN
INTRAVENOUS | Status: DC
  Administered 2016-10-13: 19:00:00 via INTRAVENOUS

## 2016-10-13 MED ORDER — DOCUSATE SODIUM 100 MG PO CAPS: 100.0000 mg | ORAL_CAPSULE | Freq: Two times a day (BID) | ORAL | 0 refills | Status: DC

## 2016-10-13 NOTE — ED Triage Notes (Signed)
Pt. Stated, I ve had stomach pain for 2 days and blood in my urine.

## 2016-10-13 NOTE — ED Notes (Signed)
Patient returned from CT

## 2016-10-13 NOTE — ED Provider Notes (Signed)
[place] DEPT Provider Note   CSN: [id number] Arrival date & time: 10/13/16  1405     History   Chief Complaint Chief Complaint  Patient presents with  . Abdominal Pain  . Hematuria    HPI Paula Warner is a 31 y.o. female.  HPI Pt started having blood in her urine yesterday.  She also started urinating frequently,.  It does not hurt to urinate but she has pain in her lower abdomen and right side.  No fever.  No vomiting.  Pt has a history of UTI and kidney stones.   She feels like she might have a kidney stone.  The pain is now severe. Past Medical History:  Diagnosis Date  . Anemia    7-8 years ago (as of 06/2016)  . Asthma   . DVT (deep venous thrombosis) ([place])   . History of kidney stones     Patient Active Problem List   Diagnosis Date Noted  . 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 ESRD (end stage renal disease) ([place]) 07/11/2016  . DVT (deep venous thrombosis) ([place]) 07/11/2016  . Nexplanon insertion 04/18/2016  . DVT of lower extremity (deep venous thrombosis) ([place]) 01/24/2016  . High risk sexual behavior 08/04/2015    Past Surgical History:  Procedure Laterality Date  . CESAREAN SECTION    . INDUCED ABORTION    . LEG SURGERY     Pt had DVT removed   . LOWER EXTREMITY ANGIOGRAM Left 07/11/2016   Procedure: Percutaneous Venous Thrombectomy with IVUS and  with Stent Placement;  Surgeon: Serafina Mitchell, MD;  Location: [facility] OR;  Service: Vascular;  Laterality: Left;    OB History    Gravida Para Term Preterm AB Living   3 1     1 2    SAB TAB Ectopic Multiple Live Births     1   0 2       Home Medications    Prior to Admission medications   Medication Sig Start Date End Date Taking? Authorizing Provider  docusate sodium (COLACE) 100 MG capsule Take 1 capsule (100 mg total) by mouth every 12 (twelve) hours. 10/13/16   Dorie Rank, MD  Etonogestrel [facility]) Inject into the skin.    Historical Provider, MD  HYDROcodone-acetaminophen  (NORCO/VICODIN) 5-325 MG tablet Take 1 tablet by mouth every 4 (four) hours as needed. 10/13/16   Dorie Rank, MD  loratadine (CLARITIN) 10 MG tablet Take 1 tablet (10 mg total) by mouth daily. 07/13/16   Okey Regal, PA-C  naproxen (NAPROSYN) 500 MG tablet Take 1 tablet (500 mg total) by mouth 2 (two) times daily with a meal. As needed for pain 10/13/16   Dorie Rank, MD  oxyCODONE-acetaminophen (PERCOCET/ROXICET) 5-325 MG tablet Take 1 tablet by mouth every 4 (four) hours as needed for severe pain. Patient not taking: Reported on 08/20/2016 07/13/16   Okey Regal, PA-C  predniSONE (DELTASONE) 10 MG tablet Take 5 tablets (50 mg total) by mouth daily. 07/13/16   Okey Regal, PA-C  rivaroxaban (XARELTO) 20 MG TABS tablet Take 1 tablet (20 mg total) by mouth daily with supper. 07/12/16   Alvia Grove, PA-C    Family History Family History  Problem Relation Age of Onset  . Heart murmur Mother   . Asthma Mother   . Clotting disorder Father   . Heart disease Father     Social History Social History  Substance Use Topics  . Smoking status: Never Smoker  . Smokeless tobacco: Never Used  .  Alcohol use 0.0 oz/week     Allergies   Zinacef [cefuroxime]   Review of Systems Review of Systems  All other systems reviewed and are negative.    Physical Exam Updated Vital Signs BP 102/55   Pulse 98   Temp 98 F (36.7 C) (Oral)   Resp 15   Ht 5\' 1"  (1.549 m)   Wt 65.8 kg   LMP 09/19/2016   SpO2 95%   BMI 27.40 kg/m   Physical Exam  Constitutional: She appears well-developed and well-nourished. She appears distressed.  HENT:  Head: Normocephalic and atraumatic.  Right Ear: External ear normal.  Left Ear: External ear normal.  Eyes: Conjunctivae are normal. Right eye exhibits no discharge. Left eye exhibits no discharge. No scleral icterus.  Neck: Neck supple. No tracheal deviation present.  Cardiovascular: Normal rate, regular rhythm and intact distal pulses.     Pulmonary/Chest: Effort normal and breath sounds normal. No stridor. No respiratory distress. She has no wheezes. She has no rales.  Abdominal: Soft. Bowel sounds are normal. She exhibits no distension. There is no tenderness. There is CVA tenderness (right). There is no rebound and no guarding.  Musculoskeletal: She exhibits no edema or tenderness.  Neurological: She is alert. She has normal strength. No cranial nerve deficit (no facial droop, extraocular movements intact, no slurred speech) or sensory deficit. She exhibits normal muscle tone. She displays no seizure activity. Coordination normal.  Skin: Skin is warm and dry. No rash noted.  Psychiatric: She has a normal mood and affect.  Nursing note and vitals reviewed.    ED Treatments / Results  Labs (all labs ordered are listed, but only abnormal results are displayed) Labs Reviewed  URINALYSIS, ROUTINE W REFLEX MICROSCOPIC - Abnormal; Notable for the following:       Result Value   Color, Urine RED (*)    APPearance CLOUDY (*)    Hgb urine dipstick LARGE (*)    Protein, ur 100 (*)    Leukocytes, UA TRACE (*)    Squamous Epithelial / LPF 6-30 (*)    All other components within normal limits  BASIC METABOLIC PANEL - Abnormal; Notable for the following:    Potassium 3.4 (*)    All other components within normal limits  URINE CULTURE  CBC  POC URINE PREG, ED    Radiology Ct Renal Stone Study  Result Date: 10/13/2016 CLINICAL DATA:  Right flank pain, history of stones EXAM: CT ABDOMEN AND PELVIS WITHOUT CONTRAST TECHNIQUE: Multidetector CT imaging of the abdomen and pelvis was performed following the standard protocol without IV contrast. COMPARISON:  CT scan 03/04/2015 FINDINGS: Lower chest: No acute findings.  Small hiatal hernia. Hepatobiliary: Unenhanced liver shows no biliary ductal dilatation. 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 Again noted multiple calcified gallstones  within gallbladder the largest measures 1 cm. Pancreas: Unenhanced pancreas with normal appearance. Spleen: Normal unenhanced spleen. Adrenals/Urinary Tract: Nonobstructive calcified calculus in midpole of the left kidney measures 5 mm. No adrenal gland mass. At least 3 nonobstructive calcified calculi are noted within right kidney the largest measures 2.4 mm. There is minimal right hydronephrosis and proximal right hydroureter. Axial image 59 there is 4 mm calcified partially obstructive calculus in distal right ureter within right pelvis about 4 cm from right UVJ. Bilateral distal ureter is small caliber. No calcified calculi are noted within urinary bladder. Stomach/Bowel: There is no small bowel obstruction. No thickened or dilated small bowel loops.  Moderate stool noted within right colon and transverse colon. Moderate to abundant stool noted within cecum. There is no pericecal inflammation. The appendix is partially visualized in axial image 57 and coronal image 33. Normal appearance. There is abundant stool in distal sigmoid colon. Distal sigmoid colon measures about 4.7 cm in diameter suspicious for mild fecal impaction. Some stool noted within rectum. Vascular/Lymphatic: No aortic aneurysm. There is a stent in the left common and left external iliac vein. No adenopathy is noted. Reproductive: Anteflexed normal appearing uterus.  No adnexal mass. Other: No ascites or free abdominal air. Musculoskeletal: No destructive bony lesions are noted. Sagittal images of the spine are unremarkable. IMPRESSION: 1. Bilateral nonobstructive nephrolithiasis. There is mild right hydronephrosis and minimal proximal right hydroureter. 2. There is 4 mm calcified partial obstructive calculus in distal right ureter about 4 cm from right UVJ. 3. No calcified calculi are noted within urinary bladder. 4. Moderate stool noted within right colon and transverse colon. Abundant stool noted within cecum. No pericecal inflammation.  Normal appendix partially visualized. Moderate to abundant stool within distal sigmoid colon. Mild distal fecal impaction cannot be excluded. 5. No small bowel obstruction. 6. There is a stent in left common and left external iliac vein. 7. No adnexal mass.  Normal appearing anteflexed uterus. 8. Multiple gallstones are again noted within gallbladder. The largest measures 1 cm. No pericholecystic fluid or gallbladder distention. Electronically Signed   By: Lahoma Crocker M.D.   On: 10/13/2016 20:26    Procedures Procedures (including critical care time)  Medications Ordered in ED Medications  0.9 %  sodium chloride infusion ( Intravenous New Bag/Given 10/13/16 1840)  ciprofloxacin (CIPRO) IVPB 400 mg (0 mg Intravenous Stopped 10/13/16 2008)  HYDROmorphone (DILAUDID) injection 1 mg (1 mg Intravenous Given 10/13/16 1838)     Initial Impression / Assessment and Plan / ED Course  I have reviewed the triage vital signs and the nursing notes.  Pertinent labs & imaging results that were available during my care of the patient were reviewed by me and considered in my medical decision making (see chart for details).   Pt presented with flank pain and hematuria.  UA has WBC but no bacteria.  Doubt infection at this point. CT scan shows a ureteral stone consistent with her pain.  Sx improved with treatment in the ED.  Stable for discharge.  Follow up urology  Will rx colace for her constipation.  Incidental gallstones discussed, pt is aware she has gallstones.  Final Clinical Impressions(s) / ED Diagnoses   Final diagnoses:  Ureteral stone  Constipation, unspecified constipation type  Gallstones    New Prescriptions New Prescriptions   DOCUSATE SODIUM (COLACE) 100 MG CAPSULE    Take 1 capsule (100 mg total) by mouth every 12 (twelve) hours.   HYDROCODONE-ACETAMINOPHEN (NORCO/VICODIN) 5-325 MG TABLET    Take 1 tablet by mouth every 4 (four) hours as needed.   NAPROXEN (NAPROSYN) 500 MG TABLET    Take  1 tablet (500 mg total) by mouth 2 (two) times daily with a meal. As needed for pain     Dorie Rank, MD 10/13/16 2115

## 2016-10-13 NOTE — Discharge Instructions (Signed)
CT scan shows a kidney stone on the right side. Follow-up with a urologist to make sure the stone passes. Take medications as needed for pain. Return to emergency room as needed for worsening symptoms

## 2016-10-14 ENCOUNTER — Encounter (HOSPITAL_COMMUNITY): Payer: Self-pay | Admitting: Emergency Medicine

## 2016-10-14 ENCOUNTER — Emergency Department (HOSPITAL_COMMUNITY)
Admission: EM | Admit: 2016-10-14 | Discharge: 2016-10-14 | Disposition: A | Discharge status: Home / Self Care | Payer: Medicaid Other | Attending: Emergency Medicine | Admitting: Emergency Medicine

## 2016-10-14 DIAGNOSIS — R109 Unspecified abdominal pain: Secondary | ICD-10-CM | POA: Diagnosis present

## 2016-10-14 DIAGNOSIS — N202 Calculus of kidney with calculus of ureter: Secondary | ICD-10-CM | POA: Insufficient documentation

## 2016-10-14 DIAGNOSIS — Z79899 Other long term (current) drug therapy: Secondary | ICD-10-CM | POA: Diagnosis not present

## 2016-10-14 DIAGNOSIS — J45909 Unspecified asthma, uncomplicated: Secondary | ICD-10-CM | POA: Insufficient documentation

## 2016-10-14 DIAGNOSIS — N186 End stage renal disease: Secondary | ICD-10-CM | POA: Diagnosis not present

## 2016-10-14 DIAGNOSIS — Z7901 Long term (current) use of anticoagulants: Secondary | ICD-10-CM | POA: Insufficient documentation

## 2016-10-14 DIAGNOSIS — N200 Calculus of kidney: Secondary | ICD-10-CM

## 2016-10-14 LAB — URINALYSIS, ROUTINE W REFLEX MICROSCOPIC
Bilirubin Urine: NEGATIVE
GLUCOSE, UA: NEGATIVE mg/dL
Ketones, ur: NEGATIVE mg/dL
Nitrite: NEGATIVE
PH: 8 (ref 5.0–8.0)
Protein, ur: 30 mg/dL — AB
SPECIFIC GRAVITY, URINE: 1.019 (ref 1.005–1.030)

## 2016-10-14 LAB — CBC WITH DIFFERENTIAL/PLATELET
BASOS ABS: 0 10*3/uL (ref 0.0–0.1)
Basophils Relative: 0 %
Eosinophils Absolute: 0 10*3/uL (ref 0.0–0.7)
Eosinophils Relative: 0 %
HEMATOCRIT: 38.6 % (ref 36.0–46.0)
Hemoglobin: 13.3 g/dL (ref 12.0–15.0)
LYMPHS PCT: 6 %
Lymphs Abs: 0.9 10*3/uL (ref 0.7–4.0)
MCH: 32.2 pg (ref 26.0–34.0)
MCHC: 34.5 g/dL (ref 30.0–36.0)
MCV: 93.5 fL (ref 78.0–100.0)
MONOS PCT: 5 %
Monocytes Absolute: 0.6 10*3/uL (ref 0.1–1.0)
NEUTROS ABS: 12.4 10*3/uL — AB (ref 1.7–7.7)
Neutrophils Relative %: 89 %
Platelets: 233 10*3/uL (ref 150–400)
RBC: 4.13 MIL/uL (ref 3.87–5.11)
RDW: 12.3 % (ref 11.5–15.5)
WBC: 13.9 10*3/uL — ABNORMAL HIGH (ref 4.0–10.5)

## 2016-10-14 LAB — BASIC METABOLIC PANEL
ANION GAP: 10 (ref 5–15)
BUN: 13 mg/dL (ref 6–20)
CALCIUM: 9.5 mg/dL (ref 8.9–10.3)
CO2: 25 mmol/L (ref 22–32)
Chloride: 104 mmol/L (ref 101–111)
Creatinine, Ser: 0.74 mg/dL (ref 0.44–1.00)
GFR calc Af Amer: >60 mL/min (ref >60)
GFR calc non Af Amer: >60 mL/min (ref >60)
GLUCOSE: 130 mg/dL — AB (ref 65–99)
Potassium: 4.1 mmol/L (ref 3.5–5.1)
Sodium: 139 mmol/L (ref 135–145)

## 2016-10-14 LAB — I-STAT BETA HCG BLOOD, ED (MC, WL, AP ONLY): I-stat hCG, quantitative: <5 m[IU]/mL (ref <5)

## 2016-10-14 MED ORDER — ONDANSETRON 4 MG PO TBDP
4.0000 mg | ORAL_TABLET | Freq: Three times a day (TID) | ORAL | 0 refills | Status: DC | PRN
Start: 2016-10-14 — End: 2017-03-05

## 2016-10-14 MED ORDER — ONDANSETRON 4 MG PO TBDP
4.0000 mg | ORAL_TABLET | Freq: Once | ORAL | Status: AC
  Administered 2016-10-14: 4 mg via ORAL
  Filled 2016-10-14: qty 1

## 2016-10-14 MED ORDER — FENTANYL CITRATE (PF) 100 MCG/2ML IJ SOLN
50.0000 ug | Freq: Once | INTRAMUSCULAR | Status: AC
  Administered 2016-10-14: 50 ug via INTRAVENOUS

## 2016-10-14 MED ORDER — OXYCODONE-ACETAMINOPHEN 5-325 MG PO TABS
1.0000 | ORAL_TABLET | Freq: Once | ORAL | Status: AC
  Administered 2016-10-14: 1 via ORAL
  Filled 2016-10-14: qty 1

## 2016-10-14 MED ORDER — KETOROLAC TROMETHAMINE 60 MG/2ML IM SOLN
60.0000 mg | Freq: Once | INTRAMUSCULAR | Status: AC
  Administered 2016-10-14: 60 mg via INTRAMUSCULAR
  Filled 2016-10-14: qty 2

## 2016-10-14 MED ORDER — CIPROFLOXACIN HCL 500 MG PO TABS
500.0000 mg | ORAL_TABLET | Freq: Two times a day (BID) | ORAL | 0 refills | Status: DC
Start: 2016-10-14 — End: 2017-03-05

## 2016-10-14 MED ORDER — FENTANYL CITRATE (PF) 100 MCG/2ML IJ SOLN
INTRAMUSCULAR | Status: AC
  Filled 2016-10-14: qty 2

## 2016-10-14 NOTE — ED Triage Notes (Signed)
Pt. reports persistent right flank pain with urinary frequency / hematuria onset this week , seen here yesterday diagnosed with bilateral non obstructing kidney stones , received IV Cipro/ IV Dilaudid , discharged home but did not fill her prescription .

## 2016-10-14 NOTE — ED Provider Notes (Signed)
[place] DEPT Provider Note   CSN: [id number] Arrival date & time: 10/14/16  0114     History   Chief Complaint Chief Complaint  Patient presents Paula  . Flank Pain    HPI Paula Warner is a 31 y.o. Warner.  HPI   Paula Warner is a 31 y.o. Warner, Paula Warner, Paula to the ED Paula continued right flank pain. Pt was seen here a few hours ago and diagnosed Paula a renal stone. Pt went to fill her prescribed pain medication and was told it would be 2-3 hours. She states she did not want to wait and wants pain medication here. Pain is severe, sharp, radiates to the right lower back. Endorses nausea and vomiting. Pt has not tried any medications for her symptoms. Denies fever/chills, dysuria, abdominal pain, or any other complaints. Also denies the development of any additional symptoms since she was last seen.   Past Medical History:  Diagnosis Date  . Anemia    7-8 years ago (as of 06/2016)  . Asthma   . DVT (deep venous thrombosis) ([place])   . History of kidney Warner     Patient Active Problem List   Diagnosis Date Noted  . 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 ESRD (end stage renal disease) ([place]) 07/11/2016  . DVT (deep venous thrombosis) ([place]) 07/11/2016  . Nexplanon insertion 04/18/2016  . DVT of lower extremity (deep venous thrombosis) ([place]) 01/24/2016  . High risk sexual behavior 08/04/2015    Past Surgical History:  Procedure Laterality Date  . CESAREAN SECTION    . INDUCED ABORTION    . LEG SURGERY     Pt had DVT removed   . LOWER EXTREMITY ANGIOGRAM Left 07/11/2016   Procedure: Percutaneous Venous Thrombectomy [place] IVUS and  [place] Stent Placement;  Surgeon: Serafina Mitchell, MD;  Location: [facility] OR;  Service: Vascular;  Laterality: Left;    OB History    Gravida Para Term Preterm AB Living   3 1     1 2    SAB TAB Ectopic Multiple Live Births     1   0 2       Home Medications    Prior to Admission medications     Medication Sig Start Date End Date Taking? Authorizing Provider  ciprofloxacin (CIPRO) 500 MG tablet Take 1 tablet (500 mg total) by mouth every 12 (twelve) hours. 10/14/16   Jamarrion Budai C Deborh Pense, PA-C  docusate sodium (COLACE) 100 MG capsule Take 1 capsule (100 mg total) by mouth every 12 (twelve) hours. 10/13/16   Dorie Rank, MD  Etonogestrel [facility]) Inject into the skin.    Historical Provider, MD  HYDROcodone-acetaminophen (NORCO/VICODIN) 5-325 MG tablet Take 1 tablet by mouth every 4 (four) hours as needed. 10/13/16   Dorie Rank, MD  loratadine (CLARITIN) 10 MG tablet Take 1 tablet (10 mg total) by mouth daily. 07/13/16   Okey Regal, PA-C  naproxen (NAPROSYN) 500 MG tablet Take 1 tablet (500 mg total) by mouth 2 (two) times daily Paula a meal. As needed for pain 10/13/16   Dorie Rank, MD  ondansetron (ZOFRAN ODT) 4 MG disintegrating tablet Take 1 tablet (4 mg total) by mouth every 8 (eight) hours as needed for nausea or vomiting. 10/14/16   Aster Screws C [place] Early, PA-C  oxyCODONE-acetaminophen (PERCOCET/ROXICET) 5-325 MG tablet Take 1 tablet by mouth every 4 (four) hours as needed for severe pain. Patient not taking: Reported on 08/20/2016 07/13/16  Dellis Filbert Hedges, PA-C  predniSONE (DELTASONE) 10 MG tablet Take 5 tablets (50 mg total) by mouth daily. 07/13/16   Okey Regal, PA-C  rivaroxaban (XARELTO) 20 MG TABS tablet Take 1 tablet (20 mg total) by mouth daily Paula supper. 07/12/16   Alvia Grove, PA-C    Family History Family History  Problem Relation Age of Onset  . Heart murmur Mother   . Asthma Mother   . Clotting disorder Father   . Heart disease Father     Social History Social History  Substance Use Topics  . Smoking status: Never Smoker  . Smokeless tobacco: Never Used  . Alcohol use 0.0 oz/week     Allergies   Zinacef [cefuroxime]   Review of Systems Review of Systems  Constitutional: Negative for chills and fever.  Respiratory: Negative for shortness of breath.    Gastrointestinal: Positive for nausea and vomiting. Negative for abdominal pain and diarrhea.  Genitourinary: Positive for flank pain. Negative for dysuria.  All other systems reviewed and are negative.    Physical Exam Updated Vital Signs BP 112/84 (BP Location: Right Arm)   Pulse 102   Temp 97.8 F (36.6 C) (Oral)   Resp 16   Ht 5\' 1"  (1.549 m)   Wt 65.8 kg   LMP 09/19/2016   SpO2 100%   BMI 27.40 kg/m   Physical Exam  Constitutional: She appears well-developed and well-nourished. No distress.  HENT:  Head: Normocephalic and atraumatic.  Eyes: Conjunctivae are normal.  Neck: Neck supple.  Cardiovascular: Normal rate, regular rhythm, normal heart sounds and intact distal pulses.   Pulmonary/Chest: Effort normal and breath sounds normal. No respiratory distress.  Abdominal: Soft. There is no tenderness. There is CVA tenderness (right). There is no guarding.  Right flank tenderness  Musculoskeletal: She exhibits no edema.  Lymphadenopathy:    She has no cervical adenopathy.  Neurological: She is alert.  Skin: Skin is warm and dry. She is not diaphoretic.  Psychiatric: She has a normal mood and affect. Her behavior is normal.  Nursing note and vitals reviewed.    ED Treatments / Results  Labs (all labs ordered are listed, but only abnormal results are displayed) Labs Reviewed  URINALYSIS, ROUTINE W REFLEX MICROSCOPIC - Abnormal; Notable for the following:       Result Value   APPearance CLOUDY (*)    Hgb urine dipstick LARGE (*)    Protein, ur 30 (*)    Leukocytes, UA MODERATE (*)    Bacteria, UA RARE (*)    Squamous Epithelial / LPF 6-30 (*)    All other components within normal limits  CBC [place] DIFFERENTIAL/PLATELET - Abnormal; Notable for the following:    WBC 13.9 (*)    Neutro Abs 12.4 (*)    All other components within normal limits  BASIC METABOLIC PANEL - Abnormal; Notable for the following:    Glucose, Bld 130 (*)    All other components within  normal limits  I-STAT BETA HCG BLOOD, ED ([facility], [facility], [facility] ONLY)    EKG  EKG Interpretation None       Radiology Ct Renal Stone Study  Result Date: 10/13/2016 CLINICAL DATA:  Right flank pain, history of Warner EXAM: CT ABDOMEN AND PELVIS WITHOUT CONTRAST TECHNIQUE: Multidetector CT imaging of the abdomen and pelvis was performed following the standard protocol without IV contrast. COMPARISON:  CT scan 03/04/2015 FINDINGS: Lower chest: No acute findings.  Small hiatal hernia. Hepatobiliary: Unenhanced liver shows no biliary ductal dilatation.  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 Again noted multiple calcified gallstones within gallbladder the largest measures 1 cm. Pancreas: Unenhanced pancreas Paula normal appearance. Spleen: Normal unenhanced spleen. Adrenals/Urinary Tract: Nonobstructive calcified calculus in midpole of the left kidney measures 5 mm. No adrenal gland mass. At least 3 nonobstructive calcified calculi are noted within right kidney the largest measures 2.4 mm. There is minimal right hydronephrosis and proximal right hydroureter. Axial image 59 there is 4 mm calcified partially obstructive calculus in distal right ureter within right pelvis about 4 cm from right UVJ. Bilateral distal ureter is small caliber. No calcified calculi are noted within urinary bladder. Stomach/Bowel: There is no small bowel obstruction. No thickened or dilated small bowel loops. Moderate stool noted within right colon and transverse colon. Moderate to abundant stool noted within cecum. There is no pericecal inflammation. The appendix is partially visualized in axial image 57 and coronal image 33. Normal appearance. There is abundant stool in distal sigmoid colon. Distal sigmoid colon measures about 4.7 cm in diameter suspicious for mild fecal impaction. Some stool noted within rectum. Vascular/Lymphatic: No aortic aneurysm. There is a stent in the left common and  left external iliac vein. No adenopathy is noted. Reproductive: Anteflexed normal appearing uterus.  No adnexal mass. Other: No ascites or free abdominal air. Musculoskeletal: No destructive bony lesions are noted. Sagittal images of the spine are unremarkable. IMPRESSION: 1. Bilateral nonobstructive nephrolithiasis. There is mild right hydronephrosis and minimal proximal right hydroureter. 2. There is 4 mm calcified partial obstructive calculus in distal right ureter about 4 cm from right UVJ. 3. No calcified calculi are noted within urinary bladder. 4. Moderate stool noted within right colon and transverse colon. Abundant stool noted within cecum. No pericecal inflammation. Normal appendix partially visualized. Moderate to abundant stool within distal sigmoid colon. Mild distal fecal impaction cannot be excluded. 5. No small bowel obstruction. 6. There is a stent in left common and left external iliac vein. 7. No adnexal mass.  Normal appearing anteflexed uterus. 8. Multiple gallstones are again noted within gallbladder. The largest measures 1 cm. No pericholecystic fluid or gallbladder distention. Electronically Signed   By: Lahoma Crocker M.D.   On: 10/13/2016 20:26    Procedures Procedures (including critical care time)  Medications Ordered in ED Medications  fentaNYL (SUBLIMAZE) 100 MCG/2ML injection (not administered)  fentaNYL (SUBLIMAZE) injection 50 mcg (50 mcg Intravenous Given 10/14/16 0220)  oxyCODONE-acetaminophen (PERCOCET/ROXICET) 5-325 MG per tablet 1 tablet (1 tablet Oral Given 10/14/16 0454)  ondansetron (ZOFRAN-ODT) disintegrating tablet 4 mg (4 mg Oral Given 10/14/16 0454)  ketorolac (TORADOL) injection 60 mg (60 mg Intramuscular Given 10/14/16 0454)     Initial Impression / Assessment and Plan / ED Course  I have reviewed the triage vital signs and the nursing notes.  Pertinent labs & imaging results that were available during my care of the patient were reviewed by me and considered  in my medical decision making (see chart for details).      Patient returns Paula continued flank pain. Has not filled her prescribed medications. Zofran added to the patient's complaint of nausea and vomiting. Cipro added due to the development of leukocytosis. Patient remains afebrile. Minor tachycardia likely due to pain. Encouraged urology follow-up. Return precautions discussed.    Vitals:   10/14/16 0115 10/14/16 0445  BP: 112/84 133/85  Pulse: 102 106  Resp: 16 18  Temp: 97.8 F (36.6 C) 97.1 F (36.2 C)  TempSrc: Oral Oral  SpO2: 100% 99%  Weight: 65.8 kg   Height: 5\' 1"  (1.549 m)      Final Clinical Impressions(s) / ED Diagnoses   Final diagnoses:  Nephrolithiasis    New Prescriptions Discharge Medication List as of 10/14/2016  4:41 AM    START taking these medications   Details  ciprofloxacin (CIPRO) 500 MG tablet Take 1 tablet (500 mg total) by mouth every 12 (twelve) hours., Starting Sun 10/14/2016, Print    ondansetron (ZOFRAN ODT) 4 MG disintegrating tablet Take 1 tablet (4 mg total) by mouth every 8 (eight) hours as needed for nausea or vomiting., Starting Sun 10/14/2016, Print         Lorayne Bender, PA-C 10/14/16 9476    Everlene Balls, MD 10/14/16 [phone]

## 2016-10-14 NOTE — Discharge Instructions (Signed)
Fill your previously provided prescriptions for pain management. Zofran for nausea or vomiting. Follow up with urology as soon as possible, as previously instructed. Call the number provided to set up an appointment.  Please take all of your antibiotics until finished!   You may develop abdominal discomfort or diarrhea from the antibiotic.  You may help offset this with probiotics which you can buy or get in yogurt. Do not eat or take the probiotics until 2 hours after your antibiotic.

## 2016-10-15 ENCOUNTER — Telehealth: Payer: Self-pay | Admitting: *Deleted

## 2016-10-15 ENCOUNTER — Other Ambulatory Visit: Payer: Self-pay | Admitting: *Deleted

## 2016-10-15 LAB — URINE CULTURE

## 2016-10-15 MED ORDER — FLUCONAZOLE 150 MG PO TABS: 150.0000 mg | ORAL_TABLET | Freq: Once | ORAL | 0 refills | Status: AC

## 2016-10-15 NOTE — Progress Notes (Signed)
Pt called to office stating she has been on antibiotics and now feels like she is getting a yeast infection. Per protocol, diflucan sent to pharmacy. Pt advised to call office if symptoms do not improve.

## 2016-10-15 NOTE — Telephone Encounter (Signed)
Pt was taking antibiotics and states that she may have an yeast infection wants to know if something can be called to pharmacy for her, states medication she took before stated with a F, unsure if it was Flagyl... Please  Advise.Marland KitchenMarland Kitchen

## 2016-10-15 NOTE — Telephone Encounter (Signed)
Rx has been sent per protocol.

## 2016-10-23 NOTE — Telephone Encounter (Signed)
error 

## 2016-12-12 ENCOUNTER — Ambulatory Visit: Payer: Medicaid Other | Admitting: Obstetrics

## 2017-02-13 ENCOUNTER — Encounter: Payer: Self-pay | Admitting: Surgery

## 2017-02-21 ENCOUNTER — Ambulatory Visit: Payer: Medicaid Other | Admitting: Certified Nurse Midwife

## 2017-02-25 ENCOUNTER — Ambulatory Visit: Payer: Medicaid Other | Admitting: Surgery

## 2017-03-05 ENCOUNTER — Ambulatory Visit (INDEPENDENT_AMBULATORY_CARE_PROVIDER_SITE_OTHER): Payer: Medicaid Other | Admitting: Obstetrics & Gynecology

## 2017-03-05 ENCOUNTER — Encounter: Payer: Self-pay | Admitting: Obstetrics & Gynecology

## 2017-03-05 VITALS — BP 106/74 | HR 72 | Ht 61.0 in | Wt 147.0 lb

## 2017-03-05 DIAGNOSIS — Z3202 Encounter for pregnancy test, result negative: Secondary | ICD-10-CM | POA: Diagnosis not present

## 2017-03-05 DIAGNOSIS — Z3009 Encounter for other general counseling and advice on contraception: Secondary | ICD-10-CM

## 2017-03-05 DIAGNOSIS — Z01419 Encounter for gynecological examination (general) (routine) without abnormal findings: Secondary | ICD-10-CM

## 2017-03-05 DIAGNOSIS — Z0000 Encounter for general adult medical examination without abnormal findings: Secondary | ICD-10-CM | POA: Diagnosis not present

## 2017-03-05 LAB — POCT URINE PREGNANCY: Preg Test, Ur: NEGATIVE

## 2017-03-05 MED ORDER — MEDROXYPROGESTERONE ACETATE 10 MG PO TABS: 10.0000 mg | ORAL_TABLET | Freq: Every day | ORAL | 6 refills | Status: DC

## 2017-03-05 NOTE — Addendum Note (Signed)
Addended by: Lewie Loron D on: 03/05/2017 02:59 PM   Modules accepted: Orders

## 2017-03-05 NOTE — Progress Notes (Signed)
Subjective:    Paula Warner is a 31 y.o. S AA P2 (7 and 78 yo kids) female who presents for an annual exam. She reports skipping periods since menarche at 31 yo. Last year she took Provera to induce a period. She thinks that she has about 4 periods per year. The patient is sexually active. GYN screening history: last pap: was normal. The patient wears seatbelts: yes. The patient participates in regular exercise: yes. Has the patient ever been transfused or tattooed?: yes. The patient reports that there is not domestic violence in her life.   Menstrual History: OB History    Gravida Para Term Preterm AB Living   3 1     1 2    SAB TAB Ectopic Multiple Live Births     1   0 2      Menarche age: 57 No LMP recorded. Patient is not currently having periods (Reason: Irregular Periods).    The following portions of the patient's history were reviewed and updated as appropriate: allergies, current medications, past family history, past medical history, past social history, past surgical history and problem list.  Review of Systems Pertinent items are noted in HPI.   She had a blood clot while on OCPs, took xerelto from 5/17 to January 2018. Her f/u US showed that the blood clot was still there. She has an appt next week with the doctor. FH- no breast/gyn/colon cancer + FH of blood clots in father and brother Works for [employer] for 9 years, lives together, FOB Denies dyspareunia, using withdrawal   Objective:    BP 106/74   Pulse 72   Ht 5\' 1"  (1.549 m)   Wt 147 lb (66.7 kg)   BMI 27.78 kg/m   General Appearance:    Alert, cooperative, no distress, appears stated age  Head:    Normocephalic, without obvious abnormality, atraumatic  Eyes:    PERRL, conjunctiva/corneas clear, EOM's intact, fundi    benign, both eyes  Ears:    Normal TM's and external ear canals, both ears  Nose:   Nares normal, septum midline, mucosa normal, no drainage    or sinus tenderness   Throat:   Lips, mucosa, and tongue normal; teeth and gums normal  Neck:   Supple, symmetrical, trachea midline, no adenopathy;    thyroid:  no enlargement/tenderness/nodules; no carotid   bruit or JVD  Back:     Symmetric, no curvature, ROM normal, no CVA tenderness  Lungs:     Clear to auscultation bilaterally, respirations unlabored  Chest Wall:    No tenderness or deformity   Heart:    Regular rate and rhythm, S1 and S2 normal, no murmur, rub   or gallop  Breast Exam:    No tenderness, masses, or nipple abnormality  Abdomen:     Soft, non-tender, bowel sounds active all four quadrants,    no masses, no organomegaly  Genitalia:    Normal female without lesion, discharge or tenderness, ULN size, NT, mobile, no adnexal masses     Extremities:   Extremities normal, atraumatic, no cyanosis or edema  Pulses:   2+ and symmetric all extremities  Skin:   Skin color, texture, turgor normal, no rashes or lesions  Lymph nodes:   Cervical, supraclavicular, and axillary nodes normal  Neurologic:   CNII-XII intact, normal strength, sensation and reflexes    throughout  .    Assessment:    Healthy female exam.   Oligomenorrhea Unwanted fertility  Plan:   Induce menses with provera (at least 6 periods per year) Sign medicaid forms today, schedule BTL in at least 30 days

## 2017-03-05 NOTE — Progress Notes (Signed)
Last pap 10/2015 , normal.  Pt states she has had irregular cycles. Pt states she may be pregnant, unsure of LMP.

## 2017-03-06 ENCOUNTER — Encounter (HOSPITAL_COMMUNITY): Payer: Self-pay

## 2017-04-03 ENCOUNTER — Encounter: Payer: Self-pay | Admitting: Surgery

## 2017-04-15 ENCOUNTER — Ambulatory Visit: Payer: Medicaid Other | Admitting: Surgery

## 2017-05-02 ENCOUNTER — Encounter (HOSPITAL_BASED_OUTPATIENT_CLINIC_OR_DEPARTMENT_OTHER): Payer: Self-pay | Admitting: *Deleted

## 2017-05-02 NOTE — Pre-Procedure Instructions (Signed)
To come for CBC and urine preg.

## 2017-05-07 ENCOUNTER — Encounter (HOSPITAL_BASED_OUTPATIENT_CLINIC_OR_DEPARTMENT_OTHER)
Admission: RE | Admit: 2017-05-07 | Discharge: 2017-05-07 | Disposition: A | Discharge status: Home / Self Care | Payer: Medicaid Other | Source: Ambulatory Visit | Attending: Obstetrics & Gynecology | Admitting: Obstetrics & Gynecology

## 2017-05-07 DIAGNOSIS — N736 Female pelvic peritoneal adhesions (postinfective): Secondary | ICD-10-CM | POA: Diagnosis not present

## 2017-05-07 DIAGNOSIS — Z01812 Encounter for preprocedural laboratory examination: Secondary | ICD-10-CM | POA: Diagnosis not present

## 2017-05-07 DIAGNOSIS — Z302 Encounter for sterilization: Secondary | ICD-10-CM | POA: Diagnosis not present

## 2017-05-07 DIAGNOSIS — J45909 Unspecified asthma, uncomplicated: Secondary | ICD-10-CM | POA: Diagnosis not present

## 2017-05-07 DIAGNOSIS — Z86718 Personal history of other venous thrombosis and embolism: Secondary | ICD-10-CM | POA: Diagnosis not present

## 2017-05-07 DIAGNOSIS — I739 Peripheral vascular disease, unspecified: Secondary | ICD-10-CM | POA: Diagnosis not present

## 2017-05-07 LAB — CBC
HEMATOCRIT: 38.2 % (ref 36.0–46.0)
Hemoglobin: 13 g/dL (ref 12.0–15.0)
MCH: 31.4 pg (ref 26.0–34.0)
MCHC: 34 g/dL (ref 30.0–36.0)
MCV: 92.3 fL (ref 78.0–100.0)
Platelets: 237 10*3/uL (ref 150–400)
RBC: 4.14 MIL/uL (ref 3.87–5.11)
RDW: 12.5 % (ref 11.5–15.5)
WBC: 7.8 10*3/uL (ref 4.0–10.5)

## 2017-05-07 LAB — POCT PREGNANCY, URINE: Preg Test, Ur: NEGATIVE

## 2017-05-07 NOTE — Progress Notes (Signed)
Pregnancy Test- Negative

## 2017-05-08 ENCOUNTER — Encounter: Payer: Self-pay | Admitting: Surgery

## 2017-05-08 ENCOUNTER — Ambulatory Visit (HOSPITAL_BASED_OUTPATIENT_CLINIC_OR_DEPARTMENT_OTHER): Payer: Medicaid Other | Admitting: Anesthesiology

## 2017-05-08 ENCOUNTER — Encounter (HOSPITAL_COMMUNITY): Payer: Self-pay

## 2017-05-08 ENCOUNTER — Encounter (HOSPITAL_BASED_OUTPATIENT_CLINIC_OR_DEPARTMENT_OTHER): Payer: Self-pay | Admitting: Anesthesiology

## 2017-05-08 ENCOUNTER — Ambulatory Visit (HOSPITAL_BASED_OUTPATIENT_CLINIC_OR_DEPARTMENT_OTHER)
Admission: RE | Admit: 2017-05-08 | Discharge: 2017-05-08 | Disposition: A | Discharge status: Home / Self Care | Payer: Medicaid Other | Source: Ambulatory Visit | Attending: Obstetrics & Gynecology | Admitting: Obstetrics & Gynecology

## 2017-05-08 ENCOUNTER — Encounter (HOSPITAL_BASED_OUTPATIENT_CLINIC_OR_DEPARTMENT_OTHER)
Admission: RE | Disposition: A | Discharge status: Home / Self Care | Payer: Self-pay | Source: Ambulatory Visit | Attending: Obstetrics & Gynecology

## 2017-05-08 DIAGNOSIS — I739 Peripheral vascular disease, unspecified: Secondary | ICD-10-CM | POA: Insufficient documentation

## 2017-05-08 DIAGNOSIS — Z302 Encounter for sterilization: Secondary | ICD-10-CM | POA: Diagnosis not present

## 2017-05-08 DIAGNOSIS — N736 Female pelvic peritoneal adhesions (postinfective): Secondary | ICD-10-CM | POA: Insufficient documentation

## 2017-05-08 DIAGNOSIS — Z86718 Personal history of other venous thrombosis and embolism: Secondary | ICD-10-CM | POA: Insufficient documentation

## 2017-05-08 DIAGNOSIS — J45909 Unspecified asthma, uncomplicated: Secondary | ICD-10-CM | POA: Insufficient documentation

## 2017-05-08 HISTORY — DX: Personal history of other venous thrombosis and embolism: Z86.718

## 2017-05-08 HISTORY — DX: Irregular menstruation, unspecified: N92.6

## 2017-05-08 HISTORY — DX: Personal history of other diseases of the respiratory system: Z87.09

## 2017-05-08 HISTORY — PX: LAPAROSCOPIC TUBAL LIGATION: SHX1937

## 2017-05-08 SURGERY — LIGATION, FALLOPIAN TUBE, LAPAROSCOPIC
Anesthesia: General | Site: Abdomen | Laterality: Bilateral

## 2017-05-08 MED ORDER — HYDROMORPHONE HCL 1 MG/ML IJ SOLN
INTRAMUSCULAR | Status: AC
  Filled 2017-05-08: qty 0.5

## 2017-05-08 MED ORDER — MIDAZOLAM HCL 2 MG/2ML IJ SOLN
INTRAMUSCULAR | Status: AC
  Filled 2017-05-08: qty 2

## 2017-05-08 MED ORDER — KETOROLAC TROMETHAMINE 30 MG/ML IJ SOLN
INTRAMUSCULAR | Status: DC | PRN
  Administered 2017-05-08: 30 mg via INTRAVENOUS

## 2017-05-08 MED ORDER — IBUPROFEN 600 MG PO TABS: 600.0000 mg | ORAL_TABLET | Freq: Four times a day (QID) | ORAL | 1 refills | Status: DC | PRN

## 2017-05-08 MED ORDER — PROPOFOL 10 MG/ML IV BOLUS
INTRAVENOUS | Status: AC
  Filled 2017-05-08: qty 20

## 2017-05-08 MED ORDER — BUPIVACAINE HCL (PF) 0.5 % IJ SOLN
INTRAMUSCULAR | Status: AC
  Filled 2017-05-08: qty 30

## 2017-05-08 MED ORDER — LIDOCAINE 2% (20 MG/ML) 5 ML SYRINGE
INTRAMUSCULAR | Status: AC
  Filled 2017-05-08: qty 5

## 2017-05-08 MED ORDER — ROCURONIUM 10MG/ML (10ML) SYRINGE FOR MEDFUSION PUMP - OPTIME
INTRAVENOUS | Status: DC | PRN
  Administered 2017-05-08: 30 mg via INTRAVENOUS

## 2017-05-08 MED ORDER — ONDANSETRON HCL 4 MG/2ML IJ SOLN
INTRAMUSCULAR | Status: DC | PRN
  Administered 2017-05-08: 4 mg via INTRAVENOUS

## 2017-05-08 MED ORDER — SILVER NITRATE-POT NITRATE 75-25 % EX MISC
CUTANEOUS | Status: AC
  Filled 2017-05-08: qty 1

## 2017-05-08 MED ORDER — MEPERIDINE HCL 25 MG/ML IJ SOLN: 6.2500 mg | INTRAMUSCULAR | Status: DC | PRN

## 2017-05-08 MED ORDER — KETOROLAC TROMETHAMINE 30 MG/ML IJ SOLN
INTRAMUSCULAR | Status: AC
  Filled 2017-05-08: qty 1

## 2017-05-08 MED ORDER — DEXAMETHASONE SODIUM PHOSPHATE 10 MG/ML IJ SOLN
INTRAMUSCULAR | Status: AC
  Filled 2017-05-08: qty 1

## 2017-05-08 MED ORDER — BUPIVACAINE HCL (PF) 0.5 % IJ SOLN
INTRAMUSCULAR | Status: DC | PRN
  Administered 2017-05-08: 10 mL

## 2017-05-08 MED ORDER — LIDOCAINE HCL (CARDIAC) 20 MG/ML IV SOLN
INTRAVENOUS | Status: DC | PRN
  Administered 2017-05-08: 30 mg via INTRAVENOUS

## 2017-05-08 MED ORDER — SUGAMMADEX SODIUM 200 MG/2ML IV SOLN
INTRAVENOUS | Status: DC | PRN
  Administered 2017-05-08: 200 mg via INTRAVENOUS

## 2017-05-08 MED ORDER — FENTANYL CITRATE (PF) 100 MCG/2ML IJ SOLN
INTRAMUSCULAR | Status: AC
  Filled 2017-05-08: qty 2

## 2017-05-08 MED ORDER — OXYCODONE HCL 5 MG PO TABS: 5.0000 mg | ORAL_TABLET | Freq: Once | ORAL | Status: DC | PRN

## 2017-05-08 MED ORDER — PROPOFOL 10 MG/ML IV BOLUS
INTRAVENOUS | Status: DC | PRN
  Administered 2017-05-08: 150 mg via INTRAVENOUS

## 2017-05-08 MED ORDER — SCOPOLAMINE 1 MG/3DAYS TD PT72: 1.0000 | MEDICATED_PATCH | Freq: Once | TRANSDERMAL | Status: DC | PRN

## 2017-05-08 MED ORDER — OXYCODONE HCL 5 MG/5ML PO SOLN: 5.0000 mg | Freq: Once | ORAL | Status: DC | PRN

## 2017-05-08 MED ORDER — FENTANYL CITRATE (PF) 100 MCG/2ML IJ SOLN: 50.0000 ug | INTRAMUSCULAR | Status: DC | PRN

## 2017-05-08 MED ORDER — HYDROMORPHONE HCL 1 MG/ML IJ SOLN
0.2500 mg | INTRAMUSCULAR | Status: DC | PRN
  Administered 2017-05-08 (×3): 0.5 mg via INTRAVENOUS

## 2017-05-08 MED ORDER — SUGAMMADEX SODIUM 200 MG/2ML IV SOLN
INTRAVENOUS | Status: AC
  Filled 2017-05-08: qty 2

## 2017-05-08 MED ORDER — DEXAMETHASONE SODIUM PHOSPHATE 4 MG/ML IJ SOLN
INTRAMUSCULAR | Status: DC | PRN
  Administered 2017-05-08: 10 mg via INTRAVENOUS

## 2017-05-08 MED ORDER — FENTANYL CITRATE (PF) 100 MCG/2ML IJ SOLN
INTRAMUSCULAR | Status: DC | PRN
  Administered 2017-05-08: 100 ug via INTRAVENOUS

## 2017-05-08 MED ORDER — ONDANSETRON HCL 4 MG/2ML IJ SOLN
INTRAMUSCULAR | Status: AC
  Filled 2017-05-08: qty 2

## 2017-05-08 MED ORDER — ALBUTEROL SULFATE HFA 108 (90 BASE) MCG/ACT IN AERS
INHALATION_SPRAY | RESPIRATORY_TRACT | Status: AC
  Filled 2017-05-08: qty 6.7

## 2017-05-08 MED ORDER — PROMETHAZINE HCL 25 MG/ML IJ SOLN: 6.2500 mg | INTRAMUSCULAR | Status: DC | PRN

## 2017-05-08 MED ORDER — LACTATED RINGERS IV SOLN
INTRAVENOUS | Status: DC
  Administered 2017-05-08 (×2): via INTRAVENOUS

## 2017-05-08 MED ORDER — ROCURONIUM BROMIDE 10 MG/ML (PF) SYRINGE
PREFILLED_SYRINGE | INTRAVENOUS | Status: AC
  Filled 2017-05-08: qty 5

## 2017-05-08 MED ORDER — MIDAZOLAM HCL 5 MG/5ML IJ SOLN
INTRAMUSCULAR | Status: DC | PRN
  Administered 2017-05-08: 2 mg via INTRAVENOUS

## 2017-05-08 MED ORDER — MIDAZOLAM HCL 2 MG/2ML IJ SOLN: 1.0000 mg | INTRAMUSCULAR | Status: DC | PRN

## 2017-05-08 MED ORDER — OXYCODONE-ACETAMINOPHEN 5-325 MG PO TABS: 1.0000 | ORAL_TABLET | Freq: Four times a day (QID) | ORAL | 0 refills | Status: DC | PRN

## 2017-05-08 MED ORDER — SUCCINYLCHOLINE CHLORIDE 200 MG/10ML IV SOSY
PREFILLED_SYRINGE | INTRAVENOUS | Status: AC
  Filled 2017-05-08: qty 10

## 2017-05-08 SURGICAL SUPPLY — 24 items
CLIP FILSHIE TUBAL LIGA STRL (Clip) ×2 IMPLANT
CLOTH BEACON ORANGE TIMEOUT ST (SAFETY) ×2 IMPLANT
DRSG OPSITE POSTOP 3X4 (GAUZE/BANDAGES/DRESSINGS) ×2 IMPLANT
DURAPREP 26ML APPLICATOR (WOUND CARE) ×2 IMPLANT
GLOVE BIO SURGEON STRL SZ 6.5 (GLOVE) ×6 IMPLANT
GLOVE BIOGEL PI IND STRL 7.0 (GLOVE) ×2 IMPLANT
GLOVE BIOGEL PI INDICATOR 7.0 (GLOVE) ×2
GOWN STRL REUS W/TWL LRG LVL3 (GOWN DISPOSABLE) ×4 IMPLANT
NDL SAFETY ECLIPSE 18X1.5 (NEEDLE) ×1 IMPLANT
NEEDLE HYPO 18GX1.5 SHARP (NEEDLE) ×1
NEEDLE INSUFFLATION 120MM (ENDOMECHANICALS) ×2 IMPLANT
NS IRRIG 1000ML POUR BTL (IV SOLUTION) ×2 IMPLANT
PACK LAPAROSCOPY BASIN (CUSTOM PROCEDURE TRAY) ×2 IMPLANT
PACK TRENDGUARD 450 HYBRID PRO (MISCELLANEOUS) ×1 IMPLANT
PACK TRENDGUARD 600 HYBRD PROC (MISCELLANEOUS) IMPLANT
SLEEVE XCEL OPT CAN 5 100 (ENDOMECHANICALS) IMPLANT
SUT VICRYL 0 UR6 27IN ABS (SUTURE) ×2 IMPLANT
SUT VICRYL 4-0 PS2 18IN ABS (SUTURE) ×2 IMPLANT
TOWEL OR 17X24 6PK STRL BLUE (TOWEL DISPOSABLE) ×4 IMPLANT
TRENDGUARD 450 HYBRID PRO PACK (MISCELLANEOUS) ×2
TRENDGUARD 600 HYBRID PROC PK (MISCELLANEOUS)
TROCAR OPTI TIP 5M 100M (ENDOMECHANICALS) IMPLANT
TROCAR XCEL DIL TIP R 11M (ENDOMECHANICALS) ×2 IMPLANT
TUBING INSUFFLATION (TUBING) ×2 IMPLANT

## 2017-05-08 NOTE — Anesthesia Preprocedure Evaluation (Signed)
Anesthesia Evaluation  Patient identified by MRN, date of birth, ID band Patient awake    Reviewed: Allergy & Precautions, H&P , NPO status , Patient's Chart, lab work & pertinent test results  History of Anesthesia Complications Negative for: history of anesthetic complications  Airway Mallampati: II  TM Distance: >3 FB Neck ROM: full    Dental no notable dental hx. (+) Teeth Intact, Dental Advisory Given   Pulmonary asthma ,    Pulmonary exam normal breath sounds clear to auscultation       Cardiovascular + Peripheral Vascular Disease  Normal cardiovascular exam Rhythm:regular Rate:Normal     Neuro/Psych negative neurological ROS     GI/Hepatic negative GI ROS, Neg liver ROS,   Endo/Other  negative endocrine ROS  Renal/GU negative Renal ROS     Musculoskeletal   Abdominal   Peds  Hematology negative hematology ROS (+)   Anesthesia Other Findings Hx of DVT  Reproductive/Obstetrics negative OB ROS                             Anesthesia Physical  Anesthesia Plan  ASA: II  Anesthesia Plan: General   Post-op Pain Management:    Induction: Intravenous  PONV Risk Score and Plan: 2 and Ondansetron and Midazolam  Airway Management Planned: Oral ETT  Additional Equipment:   Intra-op Plan:   Post-operative Plan: Extubation in OR  Informed Consent: I have reviewed the patients History and Physical, chart, labs and discussed the procedure including the risks, benefits and alternatives for the proposed anesthesia with the patient or authorized representative who has indicated his/her understanding and acceptance.   Dental Advisory Given  Plan Discussed with: Anesthesiologist, CRNA and Surgeon  Anesthesia Plan Comments:         Anesthesia Quick Evaluation

## 2017-05-08 NOTE — Op Note (Signed)
05/08/2017  10:12 AM  PATIENT:  Paula Warner  31 y.o. female  PRE-OPERATIVE DIAGNOSIS:  Undesired Fertility  POST-OPERATIVE DIAGNOSIS:  Same + pelvic adhesions  PROCEDURE:  Procedure(s): LAPAROSCOPIC TUBAL LIGATION - FILSHIE CLIPS (Bilateral)  SURGEON:  Surgeon(s) and Role:    * Santia Labate C, MD - Primary   ANESTHESIA:   local and general  EBL:  Total I/O In: 1500 [I.V.:1500] Out: -   BLOOD ADMINISTERED:none  DRAINS: none   LOCAL MEDICATIONS USED:  MARCAINE     SPECIMEN:  No Specimen  DISPOSITION OF SPECIMEN:  N/A  COUNTS:  YES  TOURNIQUET:  * No tourniquets in log *  DICTATION: .Dragon Dictation  PLAN OF CARE: Discharge to home after PACU  PATIENT DISPOSITION:  PACU - hemodynamically stable.   Delay start of Pharmacological VTE agent (>24hrs) due to surgical blood loss or risk of bleeding: not applicable   The risks, benefits, alternatives of surgery were explained understood, accepted. All questions were answered. She understands the failure rate of 1/200. She declines alternative forms of birth control. Her urine pregnancy test was negative. She was taken to the operating room and general anesthesia was applied without complication. She was placed in dorsal lithotomy position. Her abdomen and vagina were prepped and draped in the usual sterile fashion. A time out procedure was done. A bimanual exam revealed a normal, size and shape mobile uterus with nonenlarged adnexa. A Hulka manipulator was placed on the cervix. Gloves were changed and attention was turned to the abdomen. Approximately 10 mL of 0.5% Marcaine was used to infiltrate the subcutaneous tissue at the umbilicus. A vertical 1 cm incision was made. A Veres needle was placed in the pelvis. Low-flow CO2 was used to insufflate the abdomen to approximately 3 L. After good pneumoperitoneum was established, a 11 mm trocar was placed. Laparoscopy confirmed correct placement. She was placed in the Trendelenburg  position. Patient abdominal pressure was always less than 15. Her uterus was densely adherent to the anterior abdominal wall. The left oviduct was also attatched to the abdominal wall at the fimbriated end. Both ovaries appeared normal. There was no evidence of endometriosis. The oviducts were visually traced to their fimbriated end on each side. In the isthmic region of each oviduct, a Filshie clip was placed across the entire oviduct. There was no bleeding. The CO2 was allowed to escape from her abdomen. The umbilical fascia was closed with a figure of 0 Vicryl suture. No defects were palpable. The subcuticular closure was done with 4-0 Vicryl suture. The Hulka manipulator was removed. She was extubated and taken to recovery in stable condition. She tolerated the procedure well. The instrument, sponge, and needle counts were correct.

## 2017-05-08 NOTE — H&P (Signed)
Paula Warner is an 31 y.o. female engaged AA P2 here for a tubal ligation. She does not want more kids. She declines alternative forms of contraception. She had a DVT with OCPs in 2017. She was on blood thinner  for 6 months and had  #1:  U/s guided access, left popliteal vein                         #2:  Ilio-caval and left leg venogram                         #3:  IVUS, Left femoral, common femoral, external, common iliac vein, and inferior vena cava                         #4:  Angiojet mechanical thrombectomy left femoral, common femoral, external iliac and common iliac vein                         #5:  Stent left common femoral, external, and common iliac vein                         #6:  Angioplasty left femoral vein This was done last year.       No LMP recorded. Patient is not currently having periods (Reason: Irregular Periods).    Past Medical History:  Diagnosis Date  . History of asthma    no current med.  Marland Kitchen 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  . History of kidney stones   . Menstrual periods irregular     Past Surgical History:  Procedure Laterality Date  . CESAREAN SECTION     x 2  . LOWER EXTREMITY ANGIOGRAM Left 07/11/2016   Procedure: Percutaneous Venous Thrombectomy with IVUS and  with Stent Placement;  Surgeon: Serafina Mitchell, MD;  Location: [facility] OR;  Service: Vascular;  Laterality: Left;    Family History  Problem Relation Age of Onset  . Heart murmur Mother   . Asthma Mother   . Clotting disorder Father   . Heart disease Father     Social History:  reports that she has never smoked. She has never used smokeless tobacco. She reports that she does not drink alcohol or use drugs.  Allergies:  Allergies  Allergen Reactions  . Zinacef [Cefuroxime] Hives    No prescriptions prior to admission.    ROS  Blood pressure 122/64, pulse 76, temperature 98.4 F (36.9 C), temperature source Oral, resp. rate 20, height 5\' 1"  (1.549 m), weight  66.4 kg (146 lb 6 oz), SpO2 100 %. Physical Exam  Heart - RRR Lungs- CTAB Abd- benign  Results for orders placed or performed during the hospital encounter of 05/08/17 (from the past 24 hour(s))  CBC     Status: None   Collection Time: 05/07/17 12:00 PM  Result Value Ref Range   WBC 7.8 4.0 - 10.5 K/uL   RBC 4.14 3.87 - 5.11 MIL/uL   Hemoglobin 13.0 12.0 - 15.0 g/dL   HCT 38.2 36.0 - 46.0 %   MCV 92.3 78.0 - 100.0 fL   MCH 31.4 26.0 - 34.0 pg   MCHC 34.0 30.0 - 36.0 g/dL   RDW 12.5 11.5 - 15.5 %   Platelets 237 150 - 400 K/uL    No results found.  Assessment/Plan: Undesired  fertility- desire for permanent sterility. Plan for laparoscopic application of Filsche clips.  She understands the risks of surgery, including, but not to infection, bleeding, DVTs, damage to bowel, bladder, ureters. She understands the 1/200 failure rate. She understands that this is permanent. She understands the increased risk of DVTs/PEs with surgery.  She wishes to proceed.     Emily Filbert 05/08/2017, 9:21 AM

## 2017-05-08 NOTE — Discharge Instructions (Signed)

## 2017-05-08 NOTE — Anesthesia Procedure Notes (Signed)
Procedure Name: Intubation Date/Time: 05/08/2017 9:28 AM Performed by: Marrianne Mood Pre-anesthesia Checklist: Patient identified, Emergency Drugs available, Suction available, Patient being monitored and Timeout performed Patient Re-evaluated:Patient Re-evaluated prior to induction Oxygen Delivery Method: Circle system utilized Preoxygenation: Pre-oxygenation with 100% oxygen Induction Type: IV induction Ventilation: Mask ventilation without difficulty Laryngoscope Size: Miller and 2 Grade View: Grade III Tube type: Oral Tube size: 7.0 mm Number of attempts: 1 Airway Equipment and Method: Stylet and Oral airway Placement Confirmation: ETT inserted through vocal cords under direct vision,  positive ETCO2 and breath sounds checked- equal and bilateral Secured at: 20 cm Tube secured with: Tape Dental Injury: Teeth and Oropharynx as per pre-operative assessment and Injury to tongue

## 2017-05-08 NOTE — Transfer of Care (Signed)
Immediate Anesthesia Transfer of Care Note  Patient: Paula Warner  Procedure(s) Performed: Procedure(s): LAPAROSCOPIC TUBAL LIGATION - FILSHIE CLIPS (Bilateral)  Patient Location: PACU  Anesthesia Type:General  Level of Consciousness: awake and patient cooperative  Airway & Oxygen Therapy: Patient Spontanous Breathing and Patient connected to face mask oxygen  Post-op Assessment: Report given to RN and Post -op Vital signs reviewed and stable  Post vital signs: Reviewed and stable  Last Vitals:  Vitals:   05/08/17 0916 05/08/17 1035  BP: 122/64 (P) 127/76  Pulse: 76 80  Resp: 20 (!) 23  Temp: 36.9 C (!) (P) 36.3 C  SpO2: 100% 100%    Last Pain:  Vitals:   05/08/17 0916  TempSrc: Oral      Patients Stated Pain Goal: 0 (28/24/17 5301)  Complications: No apparent anesthesia complications

## 2017-05-08 NOTE — Anesthesia Postprocedure Evaluation (Signed)
Anesthesia Post Note  Patient: TANICKA BISAILLON  Procedure(s) Performed: Procedure(s) (LRB): LAPAROSCOPIC TUBAL LIGATION - FILSHIE CLIPS (Bilateral)     Patient location during evaluation: PACU Anesthesia Type: General Level of consciousness: awake and alert Pain management: pain level controlled Vital Signs Assessment: post-procedure vital signs reviewed and stable Respiratory status: spontaneous breathing, nonlabored ventilation and respiratory function stable Cardiovascular status: blood pressure returned to baseline and stable Postop Assessment: no signs of nausea or vomiting Anesthetic complications: no    Last Vitals:  Vitals:   05/08/17 1115 05/08/17 1130  BP: 125/82 (!) 124/97  Pulse: 72 (!) 55  Resp: 15 (!) 8  Temp:    SpO2: 95% 100%    Last Pain:  Vitals:   05/08/17 1130  TempSrc:   PainSc: 2                  Lynda Rainwater

## 2017-05-09 ENCOUNTER — Encounter (HOSPITAL_BASED_OUTPATIENT_CLINIC_OR_DEPARTMENT_OTHER): Payer: Self-pay | Admitting: Obstetrics & Gynecology

## 2017-05-10 NOTE — Progress Notes (Signed)
Patients significant other called about how to care for incision. Instructions given per Dr. Alease Medina AVS.

## 2017-05-14 ENCOUNTER — Encounter (HOSPITAL_BASED_OUTPATIENT_CLINIC_OR_DEPARTMENT_OTHER): Payer: Self-pay | Admitting: Obstetrics & Gynecology

## 2017-05-20 ENCOUNTER — Encounter: Payer: Self-pay | Admitting: Surgery

## 2017-05-20 ENCOUNTER — Ambulatory Visit (INDEPENDENT_AMBULATORY_CARE_PROVIDER_SITE_OTHER): Payer: Medicaid Other | Admitting: Surgery

## 2017-05-20 VITALS — BP 106/76 | HR 71 | Ht 61.0 in | Wt 147.6 lb

## 2017-05-20 DIAGNOSIS — I82412 Acute embolism and thrombosis of left femoral vein: Secondary | ICD-10-CM

## 2017-05-20 NOTE — Progress Notes (Signed)
[facility] [place]  Patient name: Paula Warner MRN: [id number] DOB: Jun 07, 1986 Sex: female   REASON FOR VISIT:    Follow up  HISOTRY OF PRESENT ILLNESS:   Paula Warner is a 31 y.o. 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  On 1018 she underwent left iliofemoral mechanical thrombectomy using the AngioJet as well as stenting of her left common femoral, external and common iliac vein as well as angioplasty of her left femoral vein.Her ultrasound in November 2017 revealed occlusion of her iliac stents.  She was asymptomatic and no intervention was recommended  The patient recently had a tubal ligation.  She is no longer taking birth control pills which was the likely source of her hypercoagulable state leading to DVT.  She did stop her anticoagulation on her own.  She has not had any difficulties.    PAST MEDICAL HISTORY:   Past Medical History:  Diagnosis Date  . History of asthma    no current med.  Marland Kitchen 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  . History of kidney stones   . Menstrual periods irregular      FAMILY HISTORY:   Family History  Problem Relation Age of Onset  . Heart murmur Mother   . Asthma Mother   . Clotting disorder Father   . Heart disease Father     SOCIAL HISTORY:   Social History  Substance Use Topics  . Smoking status: Never Smoker  . Smokeless tobacco: Never Used  . Alcohol use No     ALLERGIES:   Allergies  Allergen Reactions  . Zinacef [Cefuroxime] Hives     CURRENT MEDICATIONS:   Current Outpatient Prescriptions  Medication Sig Dispense Refill  . ibuprofen (ADVIL,MOTRIN) 600 MG tablet Take 1 tablet (600 mg total) by mouth every 6 (six) hours as needed. (Patient not taking: Reported on 05/20/2017) 30 tablet 1  . oxyCODONE-acetaminophen (PERCOCET/ROXICET) 5-325 MG tablet Take 1-2 tablets by mouth every 6 (six) hours as needed. (Patient not  taking: Reported on 05/20/2017) 20 tablet 0   No current facility-administered medications for this visit.     REVIEW OF SYSTEMS:   [X]  denotes positive finding, [ ]  denotes negative finding Cardiac  Comments:  Chest pain or chest pressure:    Shortness of breath upon exertion:    Short of breath when lying flat:    Irregular heart rhythm:        Vascular    Pain in calf, thigh, or hip brought on by ambulation:    Pain in feet at night that wakes you up from your sleep:     Blood clot in your veins:    Leg swelling:         Pulmonary    Oxygen at home:    Productive cough:     Wheezing:         Neurologic    Sudden weakness in arms or legs:     Sudden numbness in arms or legs:     Sudden onset of difficulty speaking or slurred speech:    Temporary loss of vision in one eye:     Problems with dizziness:         Gastrointestinal    Blood in stool:     Vomited blood:         Genitourinary    Burning when urinating:     Blood in urine:  Psychiatric    Major depression:         Hematologic    Bleeding problems:    Problems with blood clotting too easily:        Skin    Rashes or ulcers:        Constitutional    Fever or chills:      PHYSICAL EXAM:   Vitals:   05/20/17 1227  BP: 106/76  Pulse: 71  SpO2: 98%  Weight: 147 lb 9.6 oz (67 kg)  Height: 5\' 1"  (1.549 m)    GENERAL: The patient is a well-nourished female, in no acute distress. The vital signs are documented above. CARDIAC: There is a regular rate and rhythm.  VASCULAR: Some prominent varicosities on the left leg.  Trace edema.  Palpable pedal pulses PULMONARY: Non-labored respirations MUSCULOSKELETAL: There are no major deformities or cyanosis. NEUROLOGIC: No focal weakness or paresthesias are detected. SKIN: There are no ulcers or rashes noted. PSYCHIATRIC: The patient has a normal affect.  STUDIES:   None  MEDICAL ISSUES:   The patient reports no significant change in her leg  over the course of the past 6 months.  She does not have any significant swelling or pain.  She stopped her anticoagulation on her own.  I suspect that this will be okay given that she has completed a reasonable course for anticoagulation.  We also felt that birth control pills with the most likely source of her clot, which she is no longer taking.  I have told the patient the importance of wearing compression stockings.  She will contact me if she has any further difficulties.  I will see her back on an as-needed basis.    Annamarie Major, MD [facility] [phone] Pager [phone]

## 2017-05-28 ENCOUNTER — Emergency Department (HOSPITAL_BASED_OUTPATIENT_CLINIC_OR_DEPARTMENT_OTHER)
Admit: 2017-05-28 | Discharge: 2017-05-28 | Disposition: A | Discharge status: Home / Self Care | Payer: Medicaid Other | Attending: Emergency Medicine | Admitting: Emergency Medicine

## 2017-05-28 ENCOUNTER — Emergency Department (HOSPITAL_COMMUNITY)
Admission: EM | Admit: 2017-05-28 | Discharge: 2017-05-28 | Disposition: A | Discharge status: Home / Self Care | Payer: Medicaid Other | Attending: Emergency Medicine | Admitting: Emergency Medicine

## 2017-05-28 ENCOUNTER — Encounter (HOSPITAL_COMMUNITY): Payer: Self-pay | Admitting: Emergency Medicine

## 2017-05-28 DIAGNOSIS — J45909 Unspecified asthma, uncomplicated: Secondary | ICD-10-CM | POA: Insufficient documentation

## 2017-05-28 DIAGNOSIS — M79609 Pain in unspecified limb: Secondary | ICD-10-CM

## 2017-05-28 DIAGNOSIS — N186 End stage renal disease: Secondary | ICD-10-CM | POA: Insufficient documentation

## 2017-05-28 DIAGNOSIS — I82422 Acute embolism and thrombosis of left iliac vein: Secondary | ICD-10-CM | POA: Diagnosis not present

## 2017-05-28 DIAGNOSIS — I82522 Chronic embolism and thrombosis of left iliac vein: Secondary | ICD-10-CM

## 2017-05-28 DIAGNOSIS — R1030 Lower abdominal pain, unspecified: Secondary | ICD-10-CM | POA: Diagnosis present

## 2017-05-28 DIAGNOSIS — R1032 Left lower quadrant pain: Secondary | ICD-10-CM | POA: Diagnosis not present

## 2017-05-28 NOTE — ED Notes (Signed)
Dr Lita Mains made aware of pts complaint and hx of dvt, advised this RN to order DVT study on patient

## 2017-05-28 NOTE — ED Notes (Signed)
Pt returned from vascular. No needs

## 2017-05-28 NOTE — ED Notes (Signed)
Pt getting in gown

## 2017-05-28 NOTE — ED Triage Notes (Signed)
Pt reports left sided groin pain, states hx of blood clot last year in groin, no longer taking blood thinners. Denies pain at this time, no redness or swelling present.

## 2017-05-28 NOTE — ED Provider Notes (Signed)
[facility] DEPT Provider Note   CSN: [id number] Arrival date & time: 05/28/17  0849     History   Chief Complaint Chief Complaint  Patient presents with  . Groin Pain    HPI Paula Warner is a 31 y.o. female.  HPI     78 YOF presents today with complaints of left sided groin pain. 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 Pt is SP iliofemoral mechanical thrombectomy as well as stenting of her left common femoral, external, and common iliac vein, as well as angioplasty of her left femoral vein. She had reoccurrence of occlusion of her iliac stents in November of 2017 as evidenced by ultrasound. She was asymptomatic at that time and no intervention was recommend.   Pt notes that two days ago felt sharp pain that has been coming and going in left upper thigh, similar to previous blood clot. Pt denies swelling in the lower extremity.  No pain presently.   Non smoker, no taking birth control. PT does report that she had her tubal ligation two weeks ago. No recent travel or immobilization.   Not taking any medication.  Pt denies chest pain or SOB.   Past Medical History:  Diagnosis Date  . History of asthma    no current med.  Marland Kitchen 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  . History of kidney stones   . Menstrual periods irregular     Patient Active Problem List   Diagnosis Date Noted  . 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 ESRD (end stage renal disease) ([place]) 07/11/2016  . DVT (deep venous thrombosis) ([place]) 07/11/2016  . Nexplanon insertion 04/18/2016  . DVT of lower extremity (deep venous thrombosis) ([place]) 01/24/2016  . High risk sexual behavior 08/04/2015    Past Surgical History:  Procedure Laterality Date  . CESAREAN SECTION     x 2  . LAPAROSCOPIC TUBAL LIGATION Bilateral 05/08/2017   Procedure: LAPAROSCOPIC TUBAL LIGATION - FILSHIE CLIPS;  Surgeon: Emily Filbert, MD;  Location: [facility];   Service: Gynecology;  Laterality: Bilateral;  . LOWER EXTREMITY ANGIOGRAM Left 07/11/2016   Procedure: Percutaneous Venous Thrombectomy with IVUS and  with Stent Placement;  Surgeon: Serafina Mitchell, MD;  Location: [facility];  Service: Vascular;  Laterality: Left;    OB History    Gravida Para Term Preterm AB Living   3 1     1 2    SAB TAB Ectopic Multiple Live Births     1   0 2       Home Medications    Prior to Admission medications   Medication Sig Start Date End Date Taking? Authorizing Provider  ibuprofen (ADVIL,MOTRIN) 600 MG tablet Take 1 tablet (600 mg total) by mouth every 6 (six) hours as needed. Patient not taking: Reported on 05/20/2017 05/08/17   Emily Filbert, MD  oxyCODONE-acetaminophen (PERCOCET/ROXICET) 5-325 MG tablet Take 1-2 tablets by mouth every 6 (six) hours as needed. Patient not taking: Reported on 05/20/2017 05/08/17   Emily Filbert, MD    Family History Family History  Problem Relation Age of Onset  . Heart murmur Mother   . Asthma Mother   . Clotting disorder Father   . Heart disease Father     Social History Social History  Substance Use Topics  . Smoking status: Never Smoker  . Smokeless tobacco: Never Used  . Alcohol use No     Allergies  Zinacef [cefuroxime]   Review of Systems Review of Systems  All other systems reviewed and are negative.    Physical Exam Updated Vital Signs BP 109/74   Pulse (!) 55   Temp 98.1 F (36.7 C) (Oral)   Resp 16   Ht 5\' 2"  (1.575 m)   Wt 63 kg (139 lb)   SpO2 99%   BMI 25.42 kg/m   Physical Exam  Constitutional: She is oriented to person, place, and time. She appears well-developed and well-nourished.  HENT:  Head: Normocephalic and atraumatic.  Eyes: Pupils are equal, round, and reactive to light. Conjunctivae are normal. Right eye exhibits no discharge. Left eye exhibits no discharge. No scleral icterus.  Neck: Normal range of motion. No JVD present. No tracheal deviation present.    Pulmonary/Chest: Effort normal. No stridor.  Neurological: She is alert and oriented to person, place, and time. Coordination normal.  Psychiatric: She has a normal mood and affect. Her behavior is normal. Judgment and thought content normal.  Nursing note and vitals reviewed.    ED Treatments / Results  Labs (all labs ordered are listed, but only abnormal results are displayed) Labs Reviewed - No data to display  EKG  EKG Interpretation None       Radiology No results found.  Procedures Procedures (including critical care time)  Medications Ordered in ED Medications - No data to display   Initial Impression / Assessment and Plan / ED Course  I have reviewed the triage vital signs and the nursing notes.  Pertinent labs & imaging results that were available during my care of the patient were reviewed by me and considered in my medical decision making (see chart for details).      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  She is asymptomatic at the time of evaluation, no swelling edema or any signs of DVT.  Her ultrasound shows no acute DVT, shows continued chronic left lower extremity DVT of the left common femoral and left femoral veins.  These have likely been there since 2017.  She has been evaluated by vascular as recently as 05/20/2017.  At that time no indication for coagulation.  Patient is well-appearing in no acute distress again asymptomatic.  I have encouraged her to follow-up closely with her vascular surgeon informing him of today's visit and all relevant data and to continue ongoing discussion of anticoagulant versus watching and waiting.  Patient reports that she does have  Xarelto at home as she stopped taking it on her own, and would call vascular surgery today for guidance.  Patient is stable for discharge, she is given strict return precautions, follow-up information.  She verbalized understanding and agreement to today's plan had no further questions or concerns at the time discharge.   New Prescriptions New Prescriptions   No medications on file     Francee Gentile 05/28/17 [place], MD 05/28/17 [phone]

## 2017-05-28 NOTE — Progress Notes (Signed)
*  Preliminary Results* Left lower extremity venous duplex completed. No evidence of acute deep vein thrombosis involving the left lower extremity.  There is evidence of chronic left lower extremity deep vein thrombosis involving the left common femoral and left femoral veins. There is no evidence of left Baker's cyst.  05/28/2017 10:49 AM Maudry Mayhew, BS, RVT, RDCS, RDMS

## 2017-05-28 NOTE — Discharge Instructions (Signed)
Please contact your vascular surgeon today and discuss today's visit.  Please have a discussion whether you would like to proceed with anticoagulation.  If you have recurrence of symptoms or any new or worsening signs or symptoms please return immediately to the emergency room for evaluation.  Please read attached information.

## 2017-05-29 ENCOUNTER — Telehealth: Payer: Self-pay | Admitting: *Deleted

## 2017-05-29 NOTE — Telephone Encounter (Signed)
Patient called stating that she had been to the ED for left inguinal pain.  The scan ruled out DVT and patient was told to follow up with vascular surgeon.  She was inquiring if she needed to schedule since the pain was no longer present and the study was normal.  Dr Trula Slade states that he will see patient if she feels it necessary.  Patient requested to have an appointment at some time but not urgent.  An appointment was scheduled 07/08/2017 at 1:45.  Patient confirmed that this date was acceptable.

## 2017-07-08 ENCOUNTER — Ambulatory Visit: Payer: Medicaid Other | Admitting: Surgery

## 2017-07-21 ENCOUNTER — Emergency Department (HOSPITAL_COMMUNITY)
Admission: EM | Admit: 2017-07-21 | Discharge: 2017-07-21 | Disposition: A | Discharge status: Home / Self Care | Payer: Medicaid Other | Attending: Emergency Medicine | Admitting: Emergency Medicine

## 2017-07-21 ENCOUNTER — Encounter (HOSPITAL_COMMUNITY): Payer: Self-pay

## 2017-07-21 DIAGNOSIS — R21 Rash and other nonspecific skin eruption: Secondary | ICD-10-CM | POA: Diagnosis present

## 2017-07-21 DIAGNOSIS — N186 End stage renal disease: Secondary | ICD-10-CM | POA: Insufficient documentation

## 2017-07-21 DIAGNOSIS — J45909 Unspecified asthma, uncomplicated: Secondary | ICD-10-CM | POA: Diagnosis not present

## 2017-07-21 MED ORDER — PERMETHRIN 5 % EX CREA: TOPICAL_CREAM | CUTANEOUS | 0 refills | Status: DC

## 2017-07-21 NOTE — ED Notes (Signed)
Declined W/C at D/C and was escorted to lobby by RN. 

## 2017-07-21 NOTE — Discharge Instructions (Signed)
Please read attached information regarding her condition. Use over-the-counter antihistamine such as Zyrtec or Claritin once daily. Apply cream to affected areas as directed. Please wash your clothing, bedding and other household fabrics in hot water. Return to ED for worsening rash, fevers, body aches, tick bites, vomiting.

## 2017-07-21 NOTE — ED Triage Notes (Signed)
Patient complains of itchy rash x 2 days to back. NAD

## 2017-07-21 NOTE — ED Provider Notes (Signed)
[facility] EMERGENCY DEPARTMENT Provider Note   CSN: [id number] Arrival date & time: 07/21/17  0935     History   Chief Complaint Chief Complaint  Patient presents with  . Rash    HPI Paula Warner is a 31 y.o. female who presents to ED for evaluation of rash to back for the past 4 days. She states that the rash is itchy and reports some improvement in symptoms with topical Benadryl cream. She works at a hotel cleaning the rooms and she is unsure if one of the rooms had bed bugs or fleas in it. No sick contacts with similar symptoms. She denies any fevers, blistering, tick bites, body aches, vomiting.  HPI  Past Medical History:  Diagnosis Date  . History of asthma    no current med.  Marland Kitchen 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  . History of kidney stones   . Menstrual periods irregular     Patient Active Problem List   Diagnosis Date Noted  . 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 ESRD (end stage renal disease) ([place]) 07/11/2016  . DVT (deep venous thrombosis) ([place]) 07/11/2016  . Nexplanon insertion 04/18/2016  . DVT of lower extremity (deep venous thrombosis) ([place]) 01/24/2016  . High risk sexual behavior 08/04/2015    Past Surgical History:  Procedure Laterality Date  . CESAREAN SECTION     x 2  . LAPAROSCOPIC TUBAL LIGATION Bilateral 05/08/2017   Procedure: LAPAROSCOPIC TUBAL LIGATION - FILSHIE CLIPS;  Surgeon: Emily Filbert, MD;  Location: [facility];  Service: Gynecology;  Laterality: Bilateral;  . LOWER EXTREMITY ANGIOGRAM Left 07/11/2016   Procedure: Percutaneous Venous Thrombectomy with IVUS and  with Stent Placement;  Surgeon: Serafina Mitchell, MD;  Location[facility];  Service: Vascular;  Laterality: Left;    OB History    Gravida Para Term Preterm AB Living   3 1     1 2    SAB TAB Ectopic Multiple Live Births     1   0 2       Home Medications    Prior to Admission medications   Medication Sig Start Date End Date  Taking? Authorizing Provider  ibuprofen (ADVIL,MOTRIN) 600 MG tablet Take 1 tablet (600 mg total) by mouth every 6 (six) hours as needed. Patient not taking: Reported on 05/20/2017 05/08/17   Emily Filbert, MD  oxyCODONE-acetaminophen (PERCOCET/ROXICET) 5-325 MG tablet Take 1-2 tablets by mouth every 6 (six) hours as needed. Patient not taking: Reported on 05/20/2017 05/08/17   Emily Filbert, MD  permethrin (ELIMITE) 5 % cream Apply to affected area once 07/21/17   Delia Heady, PA-C    Family History Family History  Problem Relation Age of Onset  . Heart murmur Mother   . Asthma Mother   . Clotting disorder Father   . Heart disease Father     Social History Social History  Substance Use Topics  . Smoking status: Never Smoker  . Smokeless tobacco: Never Used  . Alcohol use No     Allergies   Zinacef [cefuroxime]   Review of Systems Review of Systems  Constitutional: Negative for chills and fever.  Respiratory: Negative for choking.   Gastrointestinal: Negative for nausea and vomiting.  Musculoskeletal: Negative for myalgias, neck pain and neck stiffness.  Skin: Positive for rash.     Physical Exam Updated Vital Signs BP 112/76   Pulse 80   Temp 98.2 F (36.8 C)   Resp 18  SpO2 99%   Physical Exam  Constitutional: She appears well-developed and well-nourished. No distress.  Nontoxic appearing and in no acute distress. No signs of airway compromise or angioedema.  HENT:  Head: Normocephalic and atraumatic.  Eyes: Conjunctivae and EOM are normal. No scleral icterus.  Neck: Normal range of motion.  Pulmonary/Chest: Effort normal. No respiratory distress.  Neurological: She is alert.  Skin: Rash noted. She is not diaphoretic. There is erythema. No pallor.  Scattered, fine erythematous papules noted around the back.  Psychiatric: She has a normal mood and affect.  Nursing note and vitals reviewed.    ED Treatments / Results  Labs (all labs ordered are listed,  but only abnormal results are displayed) Labs Reviewed - No data to display  EKG  EKG Interpretation None       Radiology No results found.  Procedures Procedures (including critical care time)  Medications Ordered in ED Medications - No data to display   Initial Impression / Assessment and Plan / ED Course  I have reviewed the triage vital signs and the nursing notes.  Pertinent labs & imaging results that were available during my care of the patient were reviewed by me and considered in my medical decision making (see chart for details).     Patient presents to ED for evaluation of rash to back for the past 4 days. She is unsure if there were bed bugs or fleas at the hotel that she works at [employer] rooms. She denies any  Tick bites, fevers, myalgias, sick contacts with similar symptoms, vomiting, signs of severe allergic reaction. On physical exam there are scattered small erythematous papules noted on the back. Pt has a patent airway without stridor and is handling secretions without difficulty; no angioedema. No blisters, no pustules, no warmth, no draining sinus tracts, no superficial abscesses, no bullous impetigo, no vesicles, no desquamation, no target lesions with dusky purpura or a central bulla. Not tender to touch. No concern for superimposed infection. No concern for SJS, TEN, TSS, tick borne illness, syphilis or other life-threatening condition.  I suspect that her symptoms are due to contact dermatitis and possible tic or bed bug exposure. We'll give Elimite cream and advised once daily antihistamine to be taken. Patient appears stable for discharge at this time. Strict return precautions given.  Final Clinical Impressions(s) / ED Diagnoses   Final diagnoses:  Rash and nonspecific skin eruption    New Prescriptions New Prescriptions   PERMETHRIN (ELIMITE) 5 % CREAM    Apply to affected area once     Delia Heady, PA-C 35/59/74 1638    Delora Fuel,  MD 45/36/46 1731

## 2017-08-09 ENCOUNTER — Encounter (HOSPITAL_COMMUNITY): Payer: Self-pay | Admitting: *Deleted

## 2017-08-09 ENCOUNTER — Emergency Department (HOSPITAL_COMMUNITY)
Admission: EM | Admit: 2017-08-09 | Discharge: 2017-08-09 | Disposition: A | Discharge status: Home / Self Care | Payer: Medicaid Other | Attending: Emergency Medicine | Admitting: Emergency Medicine

## 2017-08-09 ENCOUNTER — Other Ambulatory Visit: Payer: Self-pay

## 2017-08-09 ENCOUNTER — Emergency Department (HOSPITAL_BASED_OUTPATIENT_CLINIC_OR_DEPARTMENT_OTHER): Admit: 2017-08-09 | Discharge: 2017-08-09 | Disposition: A | Discharge status: Home / Self Care | Payer: Medicaid Other

## 2017-08-09 DIAGNOSIS — M79609 Pain in unspecified limb: Secondary | ICD-10-CM | POA: Diagnosis not present

## 2017-08-09 DIAGNOSIS — N186 End stage renal disease: Secondary | ICD-10-CM | POA: Insufficient documentation

## 2017-08-09 DIAGNOSIS — M79605 Pain in left leg: Secondary | ICD-10-CM | POA: Insufficient documentation

## 2017-08-09 DIAGNOSIS — J45909 Unspecified asthma, uncomplicated: Secondary | ICD-10-CM | POA: Diagnosis not present

## 2017-08-09 NOTE — ED Provider Notes (Signed)
[facility] EMERGENCY DEPARTMENT Provider Note   CSN: [id number] Arrival date & time: 08/09/17  9562     History   Chief Complaint Chief Complaint  Patient presents with  . Leg Pain    HPI Paula Warner is a 31 y.o. female with a history of left-sided DVT requiring percutaneous venous thrombectomy, May Thurner syndrome, ESRD who presents today for evaluation of left leg pain.  She reports that she stopped taking her Xarelto approximately 4 months ago without consulting her doctor.  Patient reports left leg swelling for 2 days, says that this feels similar to her previous DVT.  She complains of redness to the area along with burning pain in her posterior calf.  No fevers or chills.  No shortness of breath or chest pain.    HPI  Past Medical History:  Diagnosis Date  . Asthma   . History of asthma    no current med.  Marland Kitchen 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  . History of kidney stones   . Menstrual periods irregular     Patient Active Problem List   Diagnosis Date Noted  . 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 ESRD (end stage renal disease) ([place]) 07/11/2016  . DVT (deep venous thrombosis) ([place]) 07/11/2016  . Nexplanon insertion 04/18/2016  . DVT of lower extremity (deep venous thrombosis) ([place]) 01/24/2016  . High risk sexual behavior 08/04/2015    Past Surgical History:  Procedure Laterality Date  . CESAREAN SECTION     x 2  . LAPAROSCOPIC TUBAL LIGATION - FILSHIE CLIPS Bilateral 05/08/2017   Performed by Emily Filbert, MD at [facility]  . Percutaneous Venous Thrombectomy with IVUS and  with Stent Placement Left 07/11/2016   Performed by Serafina Mitchell, MD at [facility] OR    OB History    Gravida Para Term Preterm AB Living   3 1     1 2    SAB TAB Ectopic Multiple Live Births     1   0 2       Home Medications    Prior to Admission medications   Medication Sig Start Date End Date Taking? Authorizing Provider  ibuprofen  (ADVIL,MOTRIN) 600 MG tablet Take 1 tablet (600 mg total) by mouth every 6 (six) hours as needed. Patient not taking: Reported on 05/20/2017 05/08/17   Emily Filbert, MD  oxyCODONE-acetaminophen (PERCOCET/ROXICET) 5-325 MG tablet Take 1-2 tablets by mouth every 6 (six) hours as needed. Patient not taking: Reported on 05/20/2017 05/08/17   Emily Filbert, MD  permethrin (ELIMITE) 5 % cream Apply to affected area once 07/21/17   Delia Heady, PA-C    Family History Family History  Problem Relation Age of Onset  . Heart murmur Mother   . Asthma Mother   . Clotting disorder Father   . Heart disease Father     Social History Social History   Tobacco Use  . Smoking status: Never Smoker  . Smokeless tobacco: Never Used  Substance Use Topics  . Alcohol use: No    Alcohol/week: 0.0 oz  . Drug use: No     Allergies   Zinacef [cefuroxime]   Review of Systems Review of Systems  Constitutional: Negative for chills and fever.  HENT: Negative for ear pain and sore throat.   Eyes: Negative for pain and visual disturbance.  Respiratory: Negative for cough and shortness of breath.   Cardiovascular: Positive for leg swelling. Negative for chest pain and  palpitations.  Gastrointestinal: Negative for abdominal pain and vomiting.  Genitourinary: Negative for dysuria and hematuria.  Musculoskeletal: Negative for arthralgias and back pain.  Skin: Positive for color change. Negative for rash.  Neurological: Negative for seizures, syncope and headaches.  All other systems reviewed and are negative.    Physical Exam Updated Vital Signs BP 110/85 (BP Location: Right Arm)   Pulse (!) 59   Temp 98 F (36.7 C) (Oral)   Resp 18   Ht 5\' 1"  (1.549 m)   Wt 59 kg (130 lb)   LMP 07/06/2017 (Approximate)   SpO2 99%   BMI 24.56 kg/m   Physical Exam  Constitutional: She appears well-developed and well-nourished. No distress.  HENT:  Head: Normocephalic and atraumatic.  Eyes: Conjunctivae are  normal. Right eye exhibits no discharge. Left eye exhibits no discharge. No scleral icterus.  Neck: Normal range of motion.  Cardiovascular: Normal rate and regular rhythm.  2+ PT/DP pulses bilaterally.  Pulmonary/Chest: Effort normal. No stridor. No respiratory distress.  Abdominal: She exhibits no distension.  Musculoskeletal: She exhibits no edema or deformity.  No obvious swelling to left posterior calf.  Tenderness to left posterior calf.  Neurological: She is alert. She exhibits normal muscle tone.  Skin: Skin is warm and dry. She is not diaphoretic.  Psychiatric: She has a normal mood and affect. Her behavior is normal.  Nursing note and vitals reviewed.    ED Treatments / Results  Labs (all labs ordered are listed, but only abnormal results are displayed) Labs Reviewed - No data to display  EKG  EKG Interpretation None       Radiology No results found.  Procedures Procedures (including critical care time)  Medications Ordered in ED Medications - No data to display   Initial Impression / Assessment and Plan / ED Course  I have reviewed the triage vital signs and the nursing notes.  Pertinent labs & imaging results that were available during my care of the patient were reviewed by me and considered in my medical decision making (see chart for details).    Paula Warner presents today for evaluation of left leg swelling for 2 days.  She has a history of blood clots in her left leg.  Venous duplex was performed, no DVT present to left leg.  Patient discharged home instructed to follow-up with PCP.   Final Clinical Impressions(s) / ED Diagnoses   Final diagnoses:  Left leg pain    ED Discharge Orders    None       Ollen Gross 08/09/17 1343    Fatima Blank, MD 08/14/17 0000

## 2017-08-09 NOTE — Discharge Instructions (Signed)
Today your ultrasound shows that you did not have a DVT in your left leg.  Please follow-up with your primary care doctor.

## 2017-08-09 NOTE — Progress Notes (Signed)
Left lower extremity venous duplex has been completed. Negative for DVT. Results were given to Wyn Quaker PA.  08/09/17 1:26 PM Carlos Levering RVT

## 2017-08-09 NOTE — ED Triage Notes (Signed)
Pt c/o L leg swelling onset x 2 days, pt reports L leg blood clot a yr ago, and pt stopped taking Xerelto x 4 mths ago without a doctors order, pt c/o redness to the area & burning, pt denies SOB, A&O X4

## 2017-10-30 ENCOUNTER — Emergency Department (HOSPITAL_COMMUNITY)
Admission: EM | Admit: 2017-10-30 | Discharge: 2017-10-30 | Discharge status: Against Medical Advice | Payer: Medicaid Other | Attending: Emergency Medicine | Admitting: Emergency Medicine

## 2017-10-30 ENCOUNTER — Encounter (HOSPITAL_COMMUNITY): Payer: Self-pay | Admitting: Emergency Medicine

## 2017-10-30 DIAGNOSIS — R109 Unspecified abdominal pain: Secondary | ICD-10-CM | POA: Diagnosis not present

## 2017-10-30 DIAGNOSIS — Z5321 Procedure and treatment not carried out due to patient leaving prior to being seen by health care provider: Secondary | ICD-10-CM | POA: Diagnosis not present

## 2017-10-30 LAB — URINALYSIS, ROUTINE W REFLEX MICROSCOPIC
Bilirubin Urine: NEGATIVE
Glucose, UA: NEGATIVE mg/dL
Hgb urine dipstick: NEGATIVE
KETONES UR: NEGATIVE mg/dL
Leukocytes, UA: NEGATIVE
NITRITE: NEGATIVE
PROTEIN: NEGATIVE mg/dL
SPECIFIC GRAVITY, URINE: 1.025 (ref 1.005–1.030)
pH: 6 (ref 5.0–8.0)

## 2017-10-30 LAB — POC URINE PREG, ED: PREG TEST UR: NEGATIVE

## 2017-10-30 NOTE — ED Notes (Signed)
Pt states she is leaving due to wait.  Encouraged her to stay and she states she will return tomorrow.

## 2017-10-30 NOTE — ED Notes (Signed)
Pt remains in waiting room. Updated on wait for treatment room. 

## 2017-10-30 NOTE — ED Triage Notes (Addendum)
Pt c/o intermittent L flank pain x 3 days. Hx of kidney stones. Denies hematuria.

## 2017-10-31 ENCOUNTER — Other Ambulatory Visit: Payer: Self-pay

## 2017-10-31 ENCOUNTER — Encounter (HOSPITAL_COMMUNITY): Payer: Self-pay | Admitting: *Deleted

## 2017-10-31 ENCOUNTER — Emergency Department (HOSPITAL_COMMUNITY): Payer: Medicaid Other

## 2017-10-31 ENCOUNTER — Emergency Department (HOSPITAL_COMMUNITY)
Admission: EM | Admit: 2017-10-31 | Discharge: 2017-10-31 | Disposition: A | Discharge status: Home / Self Care | Payer: Medicaid Other | Attending: Emergency Medicine | Admitting: Emergency Medicine

## 2017-10-31 DIAGNOSIS — R1032 Left lower quadrant pain: Secondary | ICD-10-CM | POA: Insufficient documentation

## 2017-10-31 DIAGNOSIS — Z3202 Encounter for pregnancy test, result negative: Secondary | ICD-10-CM | POA: Diagnosis not present

## 2017-10-31 DIAGNOSIS — J45909 Unspecified asthma, uncomplicated: Secondary | ICD-10-CM | POA: Insufficient documentation

## 2017-10-31 DIAGNOSIS — R109 Unspecified abdominal pain: Secondary | ICD-10-CM

## 2017-10-31 DIAGNOSIS — N186 End stage renal disease: Secondary | ICD-10-CM | POA: Insufficient documentation

## 2017-10-31 LAB — COMPREHENSIVE METABOLIC PANEL
ALK PHOS: 96 U/L (ref 38–126)
ALT: 101 U/L — ABNORMAL HIGH (ref 14–54)
ANION GAP: 10 (ref 5–15)
AST: 50 U/L — AB (ref 15–41)
Albumin: 4 g/dL (ref 3.5–5.0)
BILIRUBIN TOTAL: 1.6 mg/dL — AB (ref 0.3–1.2)
BUN: 7 mg/dL (ref 6–20)
CALCIUM: 9.4 mg/dL (ref 8.9–10.3)
CO2: 27 mmol/L (ref 22–32)
Chloride: 104 mmol/L (ref 101–111)
Creatinine, Ser: 0.64 mg/dL (ref 0.44–1.00)
GFR calc Af Amer: >60 mL/min (ref >60)
Glucose, Bld: 82 mg/dL (ref 65–99)
POTASSIUM: 3.9 mmol/L (ref 3.5–5.1)
Sodium: 141 mmol/L (ref 135–145)
Total Protein: 7.7 g/dL (ref 6.5–8.1)

## 2017-10-31 LAB — CBC WITH DIFFERENTIAL/PLATELET
Basophils Absolute: 0 10*3/uL (ref 0.0–0.1)
Basophils Relative: 0 %
Eosinophils Absolute: 0.2 10*3/uL (ref 0.0–0.7)
Eosinophils Relative: 3 %
HEMATOCRIT: 42.2 % (ref 36.0–46.0)
Hemoglobin: 13.8 g/dL (ref 12.0–15.0)
LYMPHS PCT: 34 %
Lymphs Abs: 2.8 10*3/uL (ref 0.7–4.0)
MCH: 31.4 pg (ref 26.0–34.0)
MCHC: 32.7 g/dL (ref 30.0–36.0)
MCV: 96.1 fL (ref 78.0–100.0)
MONO ABS: 0.6 10*3/uL (ref 0.1–1.0)
MONOS PCT: 8 %
NEUTROS ABS: 4.7 10*3/uL (ref 1.7–7.7)
Neutrophils Relative %: 55 %
Platelets: 243 10*3/uL (ref 150–400)
RBC: 4.39 MIL/uL (ref 3.87–5.11)
RDW: 12.9 % (ref 11.5–15.5)
WBC: 8.4 10*3/uL (ref 4.0–10.5)

## 2017-10-31 LAB — POC URINE PREG, ED: Preg Test, Ur: NEGATIVE

## 2017-10-31 LAB — URINALYSIS, ROUTINE W REFLEX MICROSCOPIC
Bilirubin Urine: NEGATIVE
GLUCOSE, UA: NEGATIVE mg/dL
HGB URINE DIPSTICK: NEGATIVE
Ketones, ur: NEGATIVE mg/dL
Leukocytes, UA: NEGATIVE
Nitrite: NEGATIVE
PH: 5 (ref 5.0–8.0)
Protein, ur: NEGATIVE mg/dL
SPECIFIC GRAVITY, URINE: 1.025 (ref 1.005–1.030)

## 2017-10-31 IMAGING — CT CT RENAL STONE PROTOCOL
2 of 4 series · 16 of 46 positions shown, 18 images · non-contrast
Comparison: [DATE]

CLINICAL DATA: Left lateral lower abdominal pain radiating to the
back.

EXAM:
CT ABDOMEN AND PELVIS WITHOUT CONTRAST
TECHNIQUE: Multidetector CT imaging of the abdomen and pelvis was performed
following the standard protocol without IV contrast.

[Series 3: renal stone 5mm · axial · 0.78mm/px · z∈[-465,-105]mm · 13 of 80 slices shown, 15 images]
[im 4/80  soft-tissue]
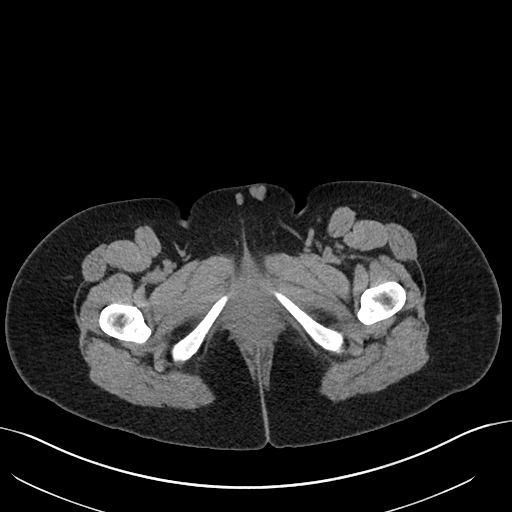
[im 4/80  bone]
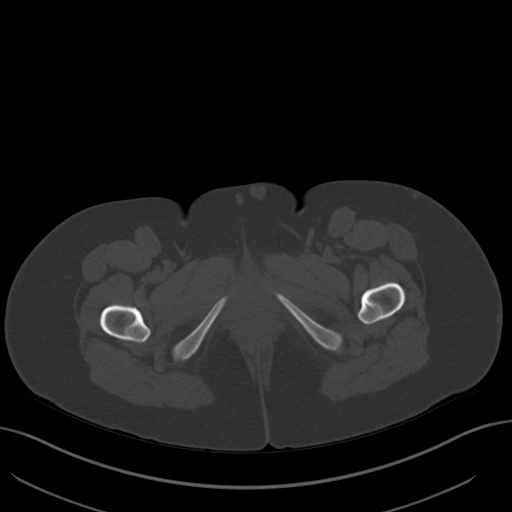
[im 10/80  soft-tissue]
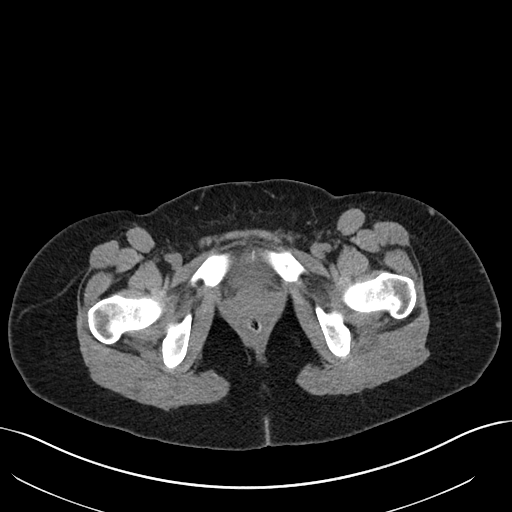
[im 16/80  soft-tissue]
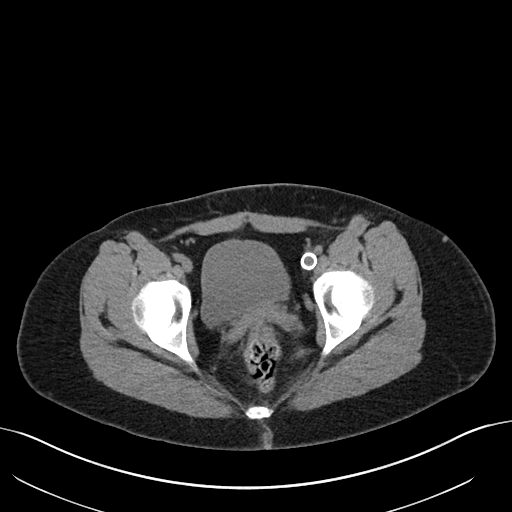
[im 23/80  soft-tissue]
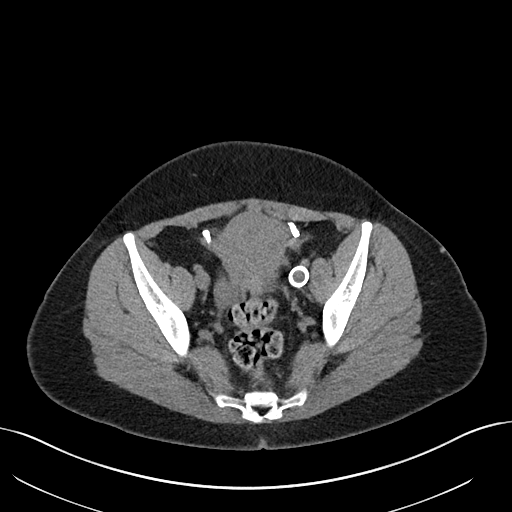
[im 29/80  soft-tissue]
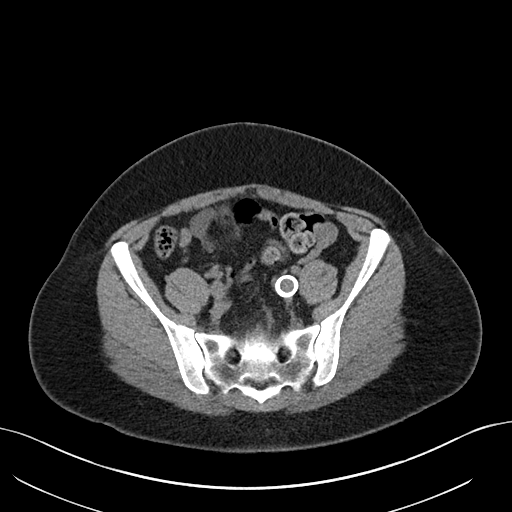
[im 35/80  soft-tissue]
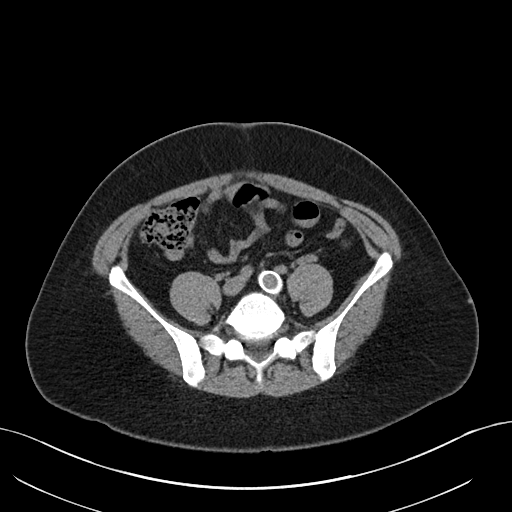
[im 42/80  soft-tissue]
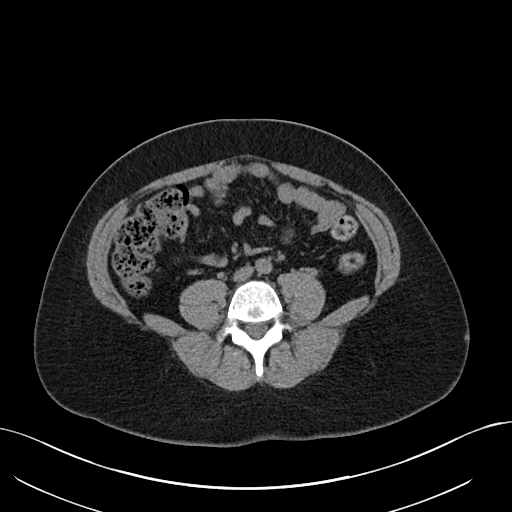
[im 45/80  soft-tissue]
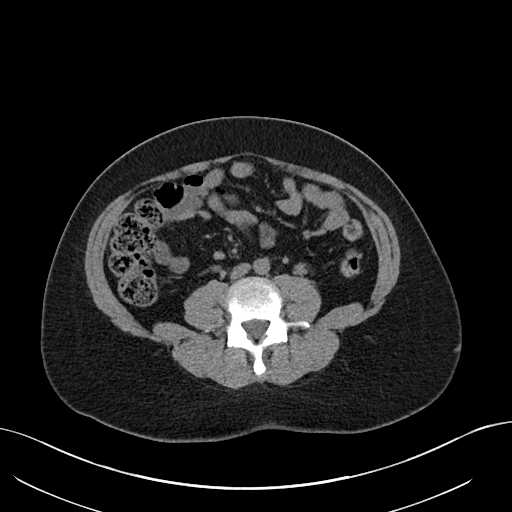
[im 51/80  soft-tissue]
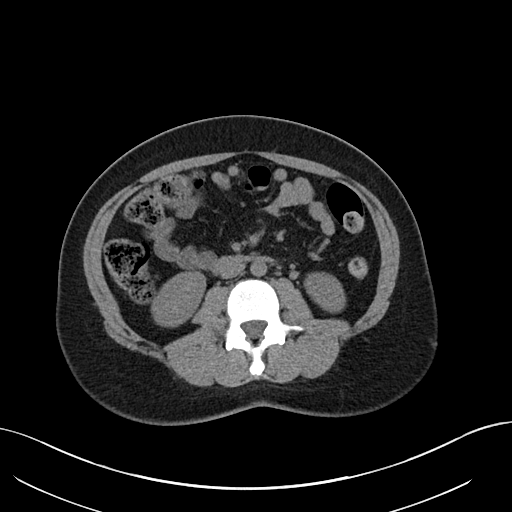
[im 51/80  bone]
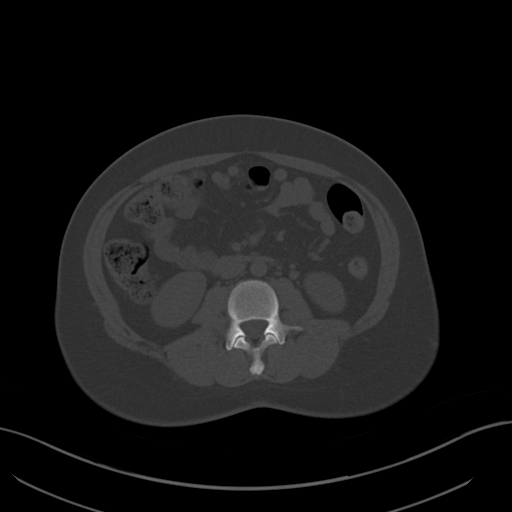
[im 57/80  soft-tissue]
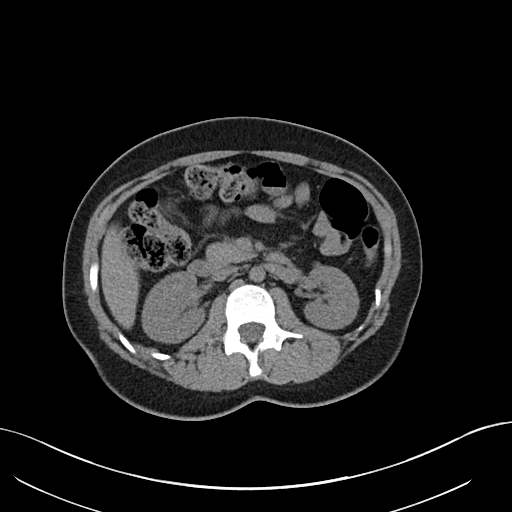
[im 64/80  soft-tissue]
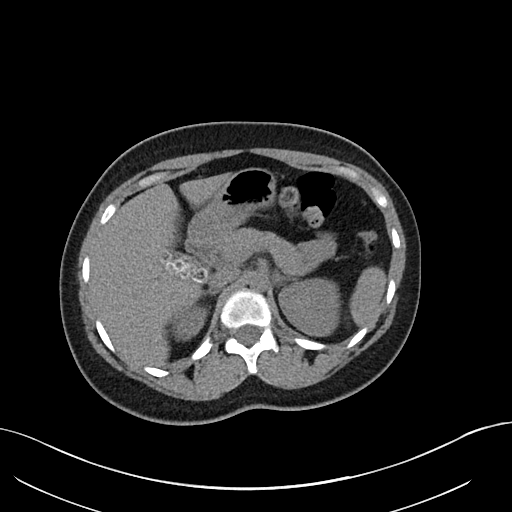
[im 70/80  soft-tissue]
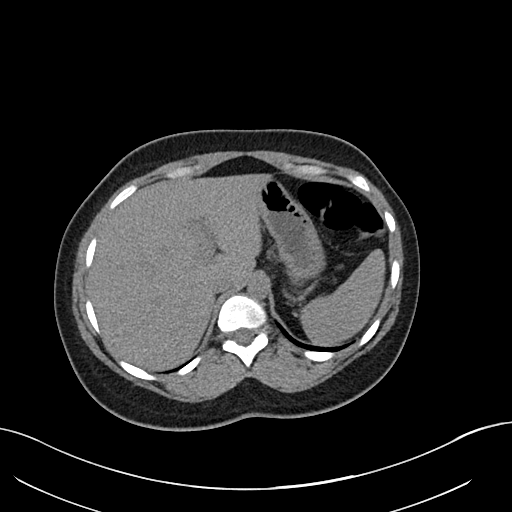
[im 76/80  soft-tissue]
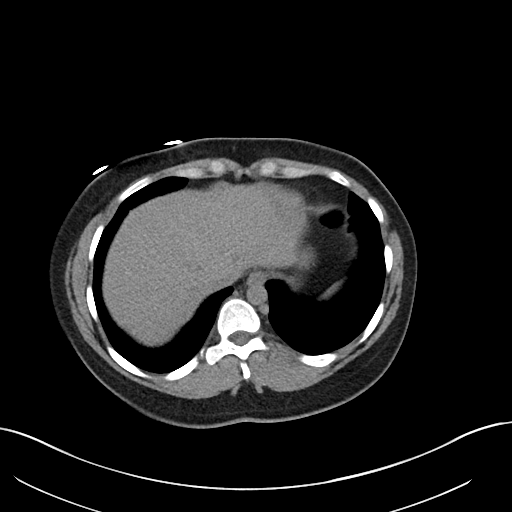

[Series 5: renal stone 3.0 cor · coronal · 0.65mm/px · 3 of 92 slices shown]
[im 31/92  soft-tissue]
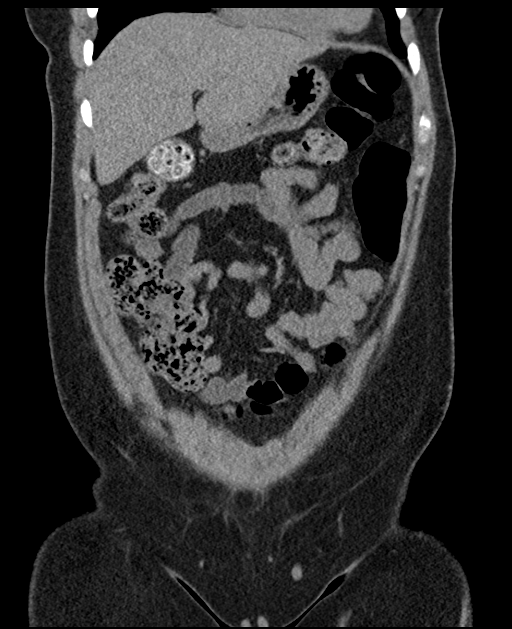
[im 41/92  soft-tissue]
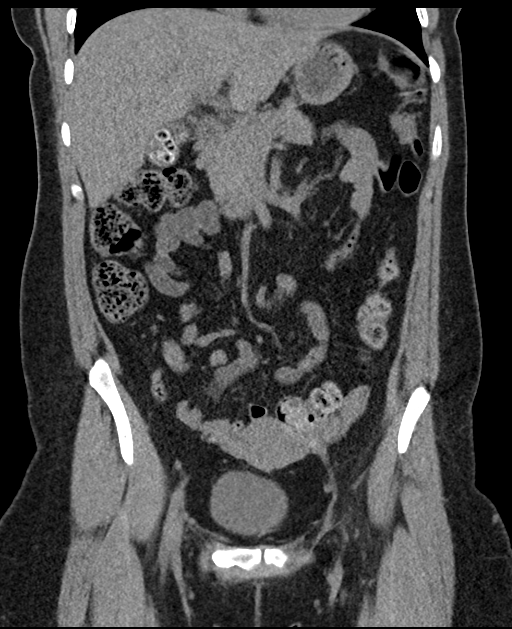
[im 51/92  soft-tissue]
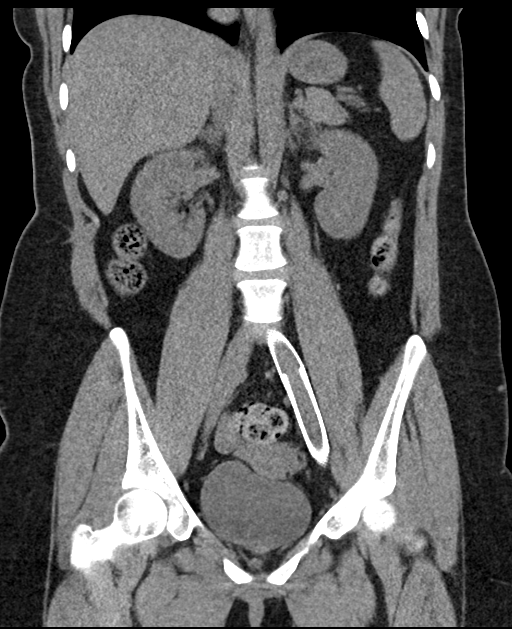

[16 of 46 positions shown; findings below may reference images not displayed]

FINDINGS: Lower chest: No acute abnormality.

Hepatobiliary: There is a small cyst in the posterior liver on
series 3, image 8, unchanged. The liver is otherwise normal. The
gallbladder is filled with stones with no obvious wall thickening or
adjacent fluid.

Pancreas: Unremarkable. No pancreatic ductal dilatation or
surrounding inflammatory changes.

Spleen: Normal in size without focal abnormality.

Adrenals/Urinary Tract: Multiple small stones are seen in both
kidneys. No hydronephrosis or perinephric stranding. No
ureterectasis or ureteral stones.

Stomach/Bowel: The stomach and small bowel are normal. The colon and
appendix are unremarkable.

Vascular/Lymphatic: No atherosclerotic change in the nonaneurysmal
aorta. No adenopathy. A stent is seen in the left common and
external iliac vein, unchanged. No other vascular abnormalities.

Reproductive: The patient is status post tubal ligation.

Other: No free air or free fluid.

Musculoskeletal: No acute or significant osseous findings.
IMPRESSION: 1. No cause for the patient's symptoms identified.
2. Nonobstructive stones in both kidneys.  No ureteral stones.
3. Stent in the left common and external iliac veins, unchanged.

## 2017-10-31 MED ORDER — KETOROLAC TROMETHAMINE 60 MG/2ML IM SOLN: 30.0000 mg | Freq: Once | INTRAMUSCULAR | Status: DC

## 2017-10-31 MED ORDER — KETOROLAC TROMETHAMINE 60 MG/2ML IM SOLN
60.0000 mg | Freq: Once | INTRAMUSCULAR | Status: AC
  Administered 2017-10-31: 60 mg via INTRAMUSCULAR
  Filled 2017-10-31: qty 2

## 2017-10-31 MED ORDER — CYCLOBENZAPRINE HCL 10 MG PO TABS: 10.0000 mg | ORAL_TABLET | Freq: Two times a day (BID) | ORAL | 0 refills | Status: DC | PRN

## 2017-10-31 NOTE — ED Provider Notes (Signed)
[facility] EMERGENCY DEPARTMENT Provider Note   CSN: [id number] Arrival date & time: 10/31/17  7846     History   Chief Complaint No chief complaint on file.   HPI Paula Warner is a 32 y.o. 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  The pain was initially intermittent, but has become constant.  Pain is worse with activity and improved with laying flat.  She denies nausea, vomiting, diarrhea, fever, chills, hematuria, dysuria, vaginal pain, itching, or discharge, chest pain.  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  She is treated her symptoms with Tylenol with no improvement.  She denies pain or swelling to the bilateral lower extremities.  The history is provided by the patient. No language interpreter was used.    Past Medical History:  Diagnosis Date  . Asthma   . History of asthma    no current med.  Marland Kitchen 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  . History of kidney stones   . Menstrual periods irregular     Patient Active Problem List   Diagnosis Date Noted  . 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 ESRD (end stage renal disease) ([place]) 07/11/2016  . DVT (deep venous thrombosis) ([place]) 07/11/2016  . Nexplanon insertion 04/18/2016  . DVT of lower extremity (deep venous thrombosis) ([place]) 01/24/2016  . High risk sexual behavior 08/04/2015    Past Surgical History:  Procedure Laterality Date  . CESAREAN SECTION     x 2  . LAPAROSCOPIC TUBAL LIGATION Bilateral 05/08/2017   Procedure: LAPAROSCOPIC TUBAL LIGATION - FILSHIE CLIPS;  Surgeon: Emily Filbert, MD;  Location: [facility];  Service: Gynecology;   Laterality: Bilateral;  . LOWER EXTREMITY ANGIOGRAM Left 07/11/2016   Procedure: Percutaneous Venous Thrombectomy with IVUS and  with Stent Placement;  Surgeon: Serafina Mitchell, MD;  Location: [facility] OR;  Service: Vascular;  Laterality: Left;    OB History    Gravida Para Term Preterm AB Living   3 1     1 2    SAB TAB Ectopic Multiple Live Births     1   0 2       Home Medications    Prior to Admission medications   Medication Sig Start Date End Date Taking? Authorizing Provider  cyclobenzaprine (FLEXERIL) 10 MG tablet Take 1 tablet (10 mg total) by mouth 2 (two) times daily as needed for muscle spasms. 10/31/17   Kiersten Coss A, PA-C    Family History Family History  Problem Relation Age of Onset  . Heart murmur Mother   . Asthma Mother   . Clotting disorder Father   . Heart disease Father     Social History Social History   Tobacco Use  . Smoking status: Never Smoker  . Smokeless tobacco: Never Used  Substance Use Topics  . Alcohol use: No    Alcohol/week: 0.0 oz  . Drug use: No     Allergies   Zinacef [cefuroxime]   Review of Systems Review of Systems  Constitutional: Negative for activity change, chills and fever.  Respiratory: Positive for shortness of breath (resolved).   Cardiovascular: Negative for chest pain,  palpitations and leg swelling.  Gastrointestinal: Positive for abdominal pain. Negative for blood in stool, constipation, diarrhea, nausea and vomiting.  Genitourinary: Positive for flank pain. Negative for dysuria, hematuria, vaginal discharge and vaginal pain.  Musculoskeletal: Negative for back pain and myalgias.  Skin: Negative for rash.  Neurological: Negative for dizziness, weakness and headaches.  Psychiatric/Behavioral: Negative for confusion.     Physical Exam Updated Vital Signs BP 120/80   Pulse 62   Temp 98 F (36.7 C) (Oral)   Resp 18   LMP 10/06/2017 (Exact Date)   SpO2 99%   Physical Exam  Constitutional: No distress.    HENT:  Head: Normocephalic.  Eyes: Conjunctivae are normal.  Neck: Neck supple.  Cardiovascular: Normal rate, regular rhythm, normal heart sounds and intact distal pulses. Exam reveals no gallop and no friction rub.  No murmur heard. Pulmonary/Chest: Effort normal. No stridor. No respiratory distress. She has no wheezes. She has no rales. She exhibits no tenderness.  Abdominal: Soft. Bowel sounds are normal. She exhibits no distension and no mass. There is tenderness. There is no rebound and no guarding. No hernia.  No peritoneal signs.  Tender to palpation in the left lower quadrant, to the left flank, and left CVA tenderness.  No rebound, guarding.  No tenderness over McBurney's point.  The remainder of the abdominal exam is unremarkable.  No organomegaly.  Neurological: She is alert.  Skin: Skin is warm. No rash noted.  Psychiatric: Her behavior is normal.  Nursing note and vitals reviewed.    ED Treatments / Results  Labs (all labs ordered are listed, but only abnormal results are displayed) Labs Reviewed  COMPREHENSIVE METABOLIC PANEL - Abnormal; Notable for the following components:      Result Value   AST 50 (*)    ALT 101 (*)    Total Bilirubin 1.6 (*)    All other components within normal limits  URINALYSIS, ROUTINE W REFLEX MICROSCOPIC  CBC WITH DIFFERENTIAL/PLATELET  POC URINE PREG, ED    EKG  EKG Interpretation None       Radiology Ct Renal Stone Study  Result Date: 10/31/2017 CLINICAL DATA:  Left lateral lower abdominal pain radiating to the back. EXAM: CT ABDOMEN AND PELVIS WITHOUT CONTRAST TECHNIQUE: Multidetector CT imaging of the abdomen and pelvis was performed following the standard protocol without IV contrast. COMPARISON:  October 13, 2016 FINDINGS: Lower chest: No acute abnormality. Hepatobiliary: There is a small cyst in the posterior liver on series 3, image 8, unchanged. The liver is otherwise normal. The gallbladder is filled with stones with no  obvious wall thickening or adjacent fluid. Pancreas: Unremarkable. No pancreatic ductal dilatation or surrounding inflammatory changes. Spleen: Normal in size without focal abnormality. Adrenals/Urinary Tract: Multiple small stones are seen in both kidneys. No hydronephrosis or perinephric stranding. No ureterectasis or ureteral stones. Stomach/Bowel: The stomach and small bowel are normal. The colon and appendix are unremarkable. Vascular/Lymphatic: No atherosclerotic change in the nonaneurysmal aorta. No adenopathy. A stent is seen in the left common and external iliac vein, unchanged. No other vascular abnormalities. Reproductive: The patient is status post tubal ligation. Other: No free air or free fluid. Musculoskeletal: No acute or significant osseous findings. IMPRESSION: 1. No cause for the patient's symptoms identified. 2. Nonobstructive stones in both kidneys.  No ureteral stones. 3. Stent in the left common and external iliac veins, unchanged. Electronically Signed   By: Dorise Bullion III M.D   On: 10/31/2017 17:44    Procedures Procedures (  including critical care time)  Medications Ordered in ED Medications  ketorolac (TORADOL) injection 60 mg (60 mg Intramuscular Given 10/31/17 1828)     Initial Impression / Assessment and Plan / ED Course  I have reviewed the triage vital signs and the nursing notes.  Pertinent labs & imaging results that were available during my care of the patient were reviewed by me and considered in my medical decision making (see chart for details).     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  On physical exam, she is minimally tender to palpation in the left lower quadrant, but is significantly tender to palpation to the left flank.  No left CVA tenderness.  Urinalysis is unremarkable.  CBC is unremarkable.  CT abdomen pelvis with several  nonobstructing stones in the bilateral kidneys, but no findings to account for the patient's pain.  AST 50.  ALT 101.  T bili is 1.6, minimally elevated.  Reviewing her medical records, her AST has been elevated in the past.  On reevaluation, the patient is having no tenderness in the right upper quadrant or symptoms associated with elevated transaminases.  Discussed these findings with the patient and encouraged her to follow-up outpatient with her PCP, Dr. Ronnald Ramp.  She acknowledged understanding of these labs and all questions were answered. Will d/c the patient home with supportive treatment, including anti-inflammatories and Flexeril.  Strict return precautions given.  No acute distress.  Patient is safe for discharge at this time.  Final Clinical Impressions(s) / ED Diagnoses   Final diagnoses:  Left flank pain    ED Discharge Orders        Ordered    cyclobenzaprine (FLEXERIL) 10 MG tablet  2 times daily PRN     10/31/17 1828       Brenlee Koskela A, PA-C 10/31/17 2157    Sherwood Gambler, MD 11/01/17 2226

## 2017-10-31 NOTE — Discharge Instructions (Signed)
CT scan did not show that any kidney stones were the cause of your pain today.  I suspect that this is a pulled muscle.   Treat your pain with 600 mg of ibuprofen every 8 hours with food.  Please make sure you take this with food to avoid upsetting her stomach.  Since you were given a dose of Toradol in the emergency department, please do not take your first dose for 6-8 hours.  You can also take thousand milligrams of Tylenol every 8 hours for pain.  Take 1 tablet of Flexeril up to 2 times daily.  Please be careful with driving or working while taking this medication because it can make her drowsy.  Start to stretch as your pain allows.  You can also apply warm or cold compresses to the area as frequently as needed to help control your pain.  If you develop new or worsening symptoms, including vomiting, severe fever, if you develop a rash over the area, chest pain, or other new concerning symptoms, please return to the emergency department for reevaluation.

## 2017-10-31 NOTE — ED Triage Notes (Signed)
Pt is here with left lateral lower abdominal pain that radiates to back, denies pregnancy r/t tubal. Pt has history of kidney stones and blood clots in legs.

## 2017-10-31 NOTE — ED Notes (Signed)
Patient transported to CT 

## 2018-08-27 ENCOUNTER — Emergency Department (HOSPITAL_COMMUNITY)
Admission: EM | Admit: 2018-08-27 | Discharge: 2018-08-27 | Disposition: A | Discharge status: Home / Self Care | Payer: Medicaid Other | Attending: Emergency Medicine | Admitting: Emergency Medicine

## 2018-08-27 ENCOUNTER — Other Ambulatory Visit: Payer: Self-pay

## 2018-08-27 ENCOUNTER — Encounter (HOSPITAL_COMMUNITY): Payer: Self-pay | Admitting: Emergency Medicine

## 2018-08-27 DIAGNOSIS — N186 End stage renal disease: Secondary | ICD-10-CM | POA: Insufficient documentation

## 2018-08-27 DIAGNOSIS — Z3202 Encounter for pregnancy test, result negative: Secondary | ICD-10-CM | POA: Insufficient documentation

## 2018-08-27 DIAGNOSIS — N898 Other specified noninflammatory disorders of vagina: Secondary | ICD-10-CM

## 2018-08-27 DIAGNOSIS — J45909 Unspecified asthma, uncomplicated: Secondary | ICD-10-CM | POA: Insufficient documentation

## 2018-08-27 LAB — I-STAT BETA HCG BLOOD, ED (MC, WL, AP ONLY)

## 2018-08-27 LAB — WET PREP, GENITAL
Sperm: NONE SEEN
TRICH WET PREP: NONE SEEN
Yeast Wet Prep HPF POC: NONE SEEN

## 2018-08-27 LAB — URINALYSIS, ROUTINE W REFLEX MICROSCOPIC
BILIRUBIN URINE: NEGATIVE
Bacteria, UA: NONE SEEN
Glucose, UA: NEGATIVE mg/dL
Ketones, ur: 5 mg/dL — AB
Leukocytes, UA: NEGATIVE
Nitrite: NEGATIVE
PH: 6 (ref 5.0–8.0)
Protein, ur: NEGATIVE mg/dL
SPECIFIC GRAVITY, URINE: 1.025 (ref 1.005–1.030)

## 2018-08-27 MED ORDER — METRONIDAZOLE 500 MG PO TABS: 500.0000 mg | ORAL_TABLET | Freq: Two times a day (BID) | ORAL | 0 refills | Status: DC

## 2018-08-27 NOTE — ED Triage Notes (Signed)
Patient c/o pink vaginal discharge x1 week. Denies odor. Denies pain, N/V/D. LMP "in the middle of September." Reports irregular periods. Hx tubal ligation.

## 2018-08-27 NOTE — ED Notes (Signed)
Provider and RN at bedside with pelvic supplies.

## 2018-08-27 NOTE — ED Provider Notes (Signed)
[facility] DEPT Provider Note   CSN: [id number] Arrival date & time: 08/27/18  1826     History   Chief Complaint Chief Complaint  Patient presents with  . Vaginal Discharge    HPI Paula Warner is a 32 y.o. female.  HPI 32 year old female with no pertinent past medical history presents to the emergency department today for evaluation of vaginal discharge.  Patient reports for the past 1 week she has had some brown to white discharge.  Patient states that she is not have any abdominal pain, vaginal irritation, urinary symptoms.  Patient does have history of tubal ligation.  She states that she googled her symptoms and was concerned that she may be pregnant.  Patient is sexually active with one female partner but has no concern for STD.  Denies any change in her bowel habits.  He has not taken anything for his symptoms.  She does report irregular periods at baseline.  Not on a birth control. Past Medical History:  Diagnosis Date  . Asthma   . History of asthma    no current med.  Marland Kitchen 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  . History of kidney stones   . Menstrual periods irregular     Patient Active Problem List   Diagnosis Date Noted  . 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 ESRD (end stage renal disease) ([place]) 07/11/2016  . DVT (deep venous thrombosis) ([place]) 07/11/2016  . Nexplanon insertion 04/18/2016  . DVT of lower extremity (deep venous thrombosis) ([place]) 01/24/2016  . High risk sexual behavior 08/04/2015    Past Surgical History:  Procedure Laterality Date  . CESAREAN SECTION     x 2  . LAPAROSCOPIC TUBAL LIGATION Bilateral 05/08/2017   Procedure: LAPAROSCOPIC TUBAL LIGATION - FILSHIE CLIPS;  Surgeon: Emily Filbert, MD;  Location: [facility];  Service: Gynecology;  Laterality: Bilateral;  . LOWER EXTREMITY ANGIOGRAM Left 07/11/2016   Procedure: Percutaneous Venous Thrombectomy with IVUS and  with Stent Placement;   Surgeon: Serafina Mitchell, MD;  Location: [facility] OR;  Service: Vascular;  Laterality: Left;     OB History    Gravida  3   Para  1   Term      Preterm      AB  1   Living  2     SAB      TAB  1   Ectopic      Multiple  0   Live Births  2            Home Medications    Prior to Admission medications   Medication Sig Start Date End Date Taking? Authorizing Provider  cyclobenzaprine (FLEXERIL) 10 MG tablet Take 1 tablet (10 mg total) by mouth 2 (two) times daily as needed for muscle spasms. Patient not taking: Reported on 08/27/2018 10/31/17   McDonald, Mia A, PA-C  metroNIDAZOLE (FLAGYL) 500 MG tablet Take 1 tablet (500 mg total) by mouth 2 (two) times daily with a meal. DO NOT CONSUME ALCOHOL WHILE TAKING THIS MEDICATION. 08/27/18   Doristine Devoid, PA-C    Family History Family History  Problem Relation Age of Onset  . Heart murmur Mother   . Asthma Mother   . Clotting disorder Father   . Heart disease Father     Social History Social History   Tobacco Use  . Smoking status: Never Smoker  . Smokeless tobacco: Never Used  Substance Use Topics  . Alcohol  use: No    Alcohol/week: 0.0 standard drinks  . Drug use: No     Allergies   Zinacef [cefuroxime]   Review of Systems Review of Systems  Constitutional: Negative for chills and fever.  Gastrointestinal: Negative for abdominal pain, diarrhea, nausea and vomiting.  Genitourinary: Positive for vaginal discharge. Negative for dysuria, flank pain, frequency, hematuria, pelvic pain, urgency and vaginal pain.  Skin: Negative for color change.     Physical Exam Updated Vital Signs BP 111/67   Pulse 78   Temp 98.5 F (36.9 C) (Oral)   Resp 17   Ht 5\' 1"  (1.549 m)   Wt 62.6 kg   LMP 06/08/2018 (Approximate)   SpO2 99%   BMI 26.07 kg/m   Physical Exam  Constitutional: She appears well-developed and well-nourished. No distress.  HENT:  Head: Normocephalic and atraumatic.  Eyes: Right eye  exhibits no discharge. Left eye exhibits no discharge. No scleral icterus.  Neck: Normal range of motion.  Pulmonary/Chest: No respiratory distress.  Abdominal: Soft. Bowel sounds are normal. She exhibits no distension. There is no tenderness. There is no rebound and no guarding.  Genitourinary:  Genitourinary Comments: Chaperone present for exam. No external lesions, swelling, erythema, or rash of the labia. No erythema,or lesions noted in the vaginal vault.  Patient does have some what appears to be dried brown blood in the vaginal vault.  No active bleeding from the cervical office.  No CMT tenderness, bleeding or friability. No adnexal tenderness, mass or fullness bilaterally. No inguinal adenopathy or hernia.    Musculoskeletal: Normal range of motion.  Neurological: She is alert.  Skin: Skin is warm and dry. Capillary refill takes less than 2 seconds. No pallor.  Psychiatric: Her behavior is normal. Judgment and thought content normal.  Nursing note and vitals reviewed.    ED Treatments / Results  Labs (all labs ordered are listed, but only abnormal results are displayed) Labs Reviewed  WET PREP, GENITAL - Abnormal; Notable for the following components:      Result Value   Clue Cells Wet Prep HPF POC PRESENT (*)    WBC, Wet Prep HPF POC MODERATE (*)    All other components within normal limits  URINALYSIS, ROUTINE W REFLEX MICROSCOPIC - Abnormal; Notable for the following components:   Hgb urine dipstick SMALL (*)    Ketones, ur 5 (*)    All other components within normal limits  I-STAT BETA HCG BLOOD, ED ([facility], [facility], [facility] ONLY)  GC/CHLAMYDIA PROBE AMP ([place]) NOT AT [facility]    EKG None  Radiology No results found.  Procedures Procedures (including critical care time)  Medications Ordered in ED Medications - No data to display   Initial Impression / Assessment and Plan / ED Course  I have reviewed the triage vital signs and the nursing notes.  Pertinent labs &  imaging results that were available during my care of the patient were reviewed by me and considered in my medical decision making (see chart for details).     Patient presents to the ED for evaluation of vaginal discharge.  Patient has no other associated symptoms.  Pregnancy test was negative in the ED today.  UA showed no signs of infection.  Wet prep does show clue cells and WBCs but no signs of trichomonas or yeast infection.  Patient does have what appears to be dried brown blood in the vaginal vault.  No cervical motion tenderness concerning for PID.  Doubt ectopic pregnancy.  May  be patient having spotting from her irregular periods.  Will give patient Flagyl for bacterial vaginosis on the wet prep.  Gonorrhea and Chlamydia cultures are pending.  Pt is hemodynamically stable, in NAD, & able to ambulate in the ED. Evaluation does not show pathology that would require ongoing emergent intervention or inpatient treatment. I explained the diagnosis to the patient. Pain has been managed & has no complaints prior to dc. Pt is comfortable with above plan and is stable for discharge at this time. All questions were answered prior to disposition. Strict return precautions for f/u to the ED were discussed. Encouraged follow up with PCP.    Final Clinical Impressions(s) / ED Diagnoses   Final diagnoses:  Vaginal discharge    ED Discharge Orders         Ordered    metroNIDAZOLE (FLAGYL) 500 MG tablet  2 times daily with meals     08/27/18 2150           Aaron Edelman 08/27/18 2204    Charlesetta Shanks, MD 08/28/18 2158

## 2018-08-27 NOTE — Discharge Instructions (Addendum)
Your urine was clear.  Gonorrhea and Chlamydia cultures are pending.  Wet prep does show signs of bacterial vaginosis.  This may be some vaginal spotting.  Pregnancy test was negative.  Follow GYN.  If you develop any further pain, worsening discharge, vomiting or urinary symptoms return the ED.  Not drink alcohol while taking this medication.

## 2018-08-28 LAB — GC/CHLAMYDIA PROBE AMP (~~LOC~~) NOT AT ARMC
Chlamydia: NEGATIVE
Neisseria Gonorrhea: NEGATIVE

## 2018-09-22 ENCOUNTER — Ambulatory Visit: Payer: Medicaid Other | Admitting: Advanced Practice Midwife

## 2018-10-03 ENCOUNTER — Ambulatory Visit: Payer: Medicaid Other | Admitting: Medical

## 2018-10-28 ENCOUNTER — Ambulatory Visit (INDEPENDENT_AMBULATORY_CARE_PROVIDER_SITE_OTHER): Payer: Medicaid Other | Admitting: Advanced Practice Midwife

## 2018-10-28 ENCOUNTER — Other Ambulatory Visit (HOSPITAL_COMMUNITY)
Admission: RE | Admit: 2018-10-28 | Discharge: 2018-10-28 | Disposition: A | Discharge status: Home / Self Care | Payer: Medicaid Other | Source: Ambulatory Visit | Attending: Advanced Practice Midwife | Admitting: Advanced Practice Midwife

## 2018-10-28 ENCOUNTER — Encounter: Payer: Self-pay | Admitting: Advanced Practice Midwife

## 2018-10-28 VITALS — BP 108/71 | HR 75 | Ht 61.0 in | Wt 156.8 lb

## 2018-10-28 DIAGNOSIS — I82409 Acute embolism and thrombosis of unspecified deep veins of unspecified lower extremity: Secondary | ICD-10-CM

## 2018-10-28 DIAGNOSIS — Z01419 Encounter for gynecological examination (general) (routine) without abnormal findings: Secondary | ICD-10-CM

## 2018-10-28 DIAGNOSIS — Z0000 Encounter for general adult medical examination without abnormal findings: Secondary | ICD-10-CM | POA: Diagnosis not present

## 2018-10-28 DIAGNOSIS — N911 Secondary amenorrhea: Secondary | ICD-10-CM | POA: Insufficient documentation

## 2018-10-28 DIAGNOSIS — Z113 Encounter for screening for infections with a predominantly sexual mode of transmission: Secondary | ICD-10-CM

## 2018-10-28 LAB — POCT URINE PREGNANCY: Preg Test, Ur: NEGATIVE

## 2018-10-28 MED ORDER — MEDROXYPROGESTERONE ACETATE 10 MG PO TABS: 10.0000 mg | ORAL_TABLET | Freq: Every day | ORAL | 2 refills | Status: DC

## 2018-10-28 NOTE — Patient Instructions (Signed)

## 2018-10-28 NOTE — Progress Notes (Signed)
Pt presents for annual c/o amenorrhea. No menses since Oct. 2019. Pt had amenorrhea issues in the past. She requests all STD testing today.

## 2018-10-28 NOTE — Progress Notes (Signed)
Subjective:     Paula Warner is a 33 y.o. female here for a routine exam.  Current complaints: amenorrhea, no menses x 3 months.  BTL for contraception. She desires STD screening. Personal health questionnaire reviewed: yes.   Gynecologic History Patient's last menstrual period was 06/27/2018. Contraception: tubal ligation Last Pap: 2017. Results were: normal Last mammogram: n/a.   Obstetric History OB History  Gravida Para Term Preterm AB Living  3 1     1 2   SAB TAB Ectopic Multiple Live Births    1   0 2    # Outcome Date GA Lbr Len/2nd Weight Sex Delivery Anes PTL Lv  3 TAB 04/29/15          2 Gravida 04/15/10 [redacted]w[redacted]d  2892 g M CS-LTranv Spinal N LIV  1 Para 12/01/04 [redacted]w[redacted]d  2920 g M CS-LTranv Spinal N LIV     Complications: Dysfunctional Labor     The following portions of the patient's history were reviewed and updated as appropriate: allergies, current medications, past family history, past medical history, past social history, past surgical history and problem list.  Review of Systems Pertinent items noted in HPI and remainder of comprehensive ROS otherwise negative.    Objective:   BP 108/71   Pulse 75   Ht 5\' 1"  (1.549 m)   Wt 71.1 kg   LMP 06/27/2018   BMI 29.63 kg/m    VS reviewed, nursing note reviewed,  Constitutional: well developed, well nourished, no distress HEENT: normocephalic CV: normal rate Pulm/chest wall: normal effort Breast Exam:  right breast normal without mass, skin or nipple changes or axillary nodes, left breast normal without mass, skin or nipple changes or axillary nodes Abdomen: soft Neuro: alert and oriented x 3 Skin: warm, dry Psych: affect normal Pelvic exam: Cervix pink, visually closed, without lesion, scant white creamy discharge, vaginal walls and external genitalia normal Bimanual exam: Cervix 0/long/high, firm, anterior, neg CMT, uterus nontender, nonenlarged, adnexa without tenderness, enlargement, or mass       Assessment/Plan:   1. Well woman exam with routine gynecological exam  - Cytology - PAP  2. Secondary amenorrhea --No menses x 3 months, history of amenorrhea with withdrawal bleed after progesterone challenge. --Had Nexplanon in 2017, removed same year because pt had DVT. Was on OCPs prior to [place].  - POCT urine pregnancy - Follicle stimulating hormone - Prolactin - TSH --Rx for Provera 10 mg daily x 10 days --F/U in 3 months in office  3. Screening examination for STD (sexually transmitted disease)  - Cervicovaginal ancillary only( [place]) - Hepatitis B surface antigen - Hepatitis C antibody - HIV Antibody (routine testing w rflx) - RPR  4.  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  Nexplanon unlikely cause of DVT with only progesterone. 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   --I recommend pt f/u with primary care and have testing for clotting disorders and review need for continuous anticoagulation therapy again. Pt states understanding and will f/u with PCP.  Follow up in: 3 months or as needed.   Fatima Blank, CNM 10:03 AM

## 2018-10-29 LAB — CERVICOVAGINAL ANCILLARY ONLY
CHLAMYDIA, DNA PROBE: NEGATIVE
Neisseria Gonorrhea: NEGATIVE
TRICH (WINDOWPATH): NEGATIVE

## 2018-10-29 LAB — HEPATITIS C ANTIBODY: Hep C Virus Ab: <0.1 s/co ratio (ref 0.0–0.9)

## 2018-10-29 LAB — RPR: RPR Ser Ql: NONREACTIVE

## 2018-10-29 LAB — TSH: TSH: 1.9 u[IU]/mL (ref 0.450–4.500)

## 2018-10-29 LAB — HIV ANTIBODY (ROUTINE TESTING W REFLEX): HIV Screen 4th Generation wRfx: NONREACTIVE

## 2018-10-29 LAB — FOLLICLE STIMULATING HORMONE: FSH: 5.9 m[IU]/mL

## 2018-10-29 LAB — PROLACTIN: Prolactin: 11.2 ng/mL (ref 4.8–23.3)

## 2018-10-29 LAB — HEPATITIS B SURFACE ANTIGEN: Hepatitis B Surface Ag: NEGATIVE

## 2018-10-30 LAB — CYTOLOGY - PAP
Diagnosis: NEGATIVE
HPV (WINDOPATH): NOT DETECTED

## 2019-04-06 ENCOUNTER — Emergency Department (HOSPITAL_COMMUNITY): Payer: Self-pay

## 2019-04-06 ENCOUNTER — Emergency Department (HOSPITAL_COMMUNITY)
Admission: EM | Admit: 2019-04-06 | Discharge: 2019-04-06 | Disposition: A | Discharge status: Home / Self Care | Payer: Self-pay | Attending: Emergency Medicine | Admitting: Emergency Medicine

## 2019-04-06 ENCOUNTER — Other Ambulatory Visit: Payer: Self-pay

## 2019-04-06 ENCOUNTER — Encounter (HOSPITAL_COMMUNITY): Payer: Self-pay | Admitting: Emergency Medicine

## 2019-04-06 DIAGNOSIS — N200 Calculus of kidney: Secondary | ICD-10-CM

## 2019-04-06 DIAGNOSIS — N202 Calculus of kidney with calculus of ureter: Secondary | ICD-10-CM | POA: Insufficient documentation

## 2019-04-06 DIAGNOSIS — J45909 Unspecified asthma, uncomplicated: Secondary | ICD-10-CM | POA: Insufficient documentation

## 2019-04-06 DIAGNOSIS — N186 End stage renal disease: Secondary | ICD-10-CM | POA: Insufficient documentation

## 2019-04-06 LAB — COMPREHENSIVE METABOLIC PANEL
ALT: 82 U/L — ABNORMAL HIGH (ref 0–44)
AST: 38 U/L (ref 15–41)
Albumin: 4.2 g/dL (ref 3.5–5.0)
Alkaline Phosphatase: 104 U/L (ref 38–126)
Anion gap: 9 (ref 5–15)
BUN: 13 mg/dL (ref 6–20)
CO2: 27 mmol/L (ref 22–32)
Calcium: 9.4 mg/dL (ref 8.9–10.3)
Chloride: 104 mmol/L (ref 98–111)
Creatinine, Ser: 0.59 mg/dL (ref 0.44–1.00)
GFR calc Af Amer: >60 mL/min (ref >60)
GFR calc non Af Amer: >60 mL/min (ref >60)
Glucose, Bld: 89 mg/dL (ref 70–99)
Potassium: 4.1 mmol/L (ref 3.5–5.1)
Sodium: 140 mmol/L (ref 135–145)
Total Bilirubin: 1.1 mg/dL (ref 0.3–1.2)
Total Protein: 8.4 g/dL — ABNORMAL HIGH (ref 6.5–8.1)

## 2019-04-06 LAB — URINALYSIS, ROUTINE W REFLEX MICROSCOPIC
Bilirubin Urine: NEGATIVE
Glucose, UA: NEGATIVE mg/dL
Ketones, ur: NEGATIVE mg/dL
Leukocytes,Ua: NEGATIVE
Nitrite: NEGATIVE
Protein, ur: NEGATIVE mg/dL
Specific Gravity, Urine: 1.012 (ref 1.005–1.030)
pH: 6 (ref 5.0–8.0)

## 2019-04-06 LAB — CBC WITH DIFFERENTIAL/PLATELET
Abs Immature Granulocytes: 0.03 10*3/uL (ref 0.00–0.07)
Basophils Absolute: 0.1 10*3/uL (ref 0.0–0.1)
Basophils Relative: 1 %
Eosinophils Absolute: 0.2 10*3/uL (ref 0.0–0.5)
Eosinophils Relative: 2 %
HCT: 43.2 % (ref 36.0–46.0)
Hemoglobin: 14.1 g/dL (ref 12.0–15.0)
Immature Granulocytes: 0 %
Lymphocytes Relative: 28 %
Lymphs Abs: 2.8 10*3/uL (ref 0.7–4.0)
MCH: 31.8 pg (ref 26.0–34.0)
MCHC: 32.6 g/dL (ref 30.0–36.0)
MCV: 97.3 fL (ref 80.0–100.0)
Monocytes Absolute: 1 10*3/uL (ref 0.1–1.0)
Monocytes Relative: 10 %
Neutro Abs: 6 10*3/uL (ref 1.7–7.7)
Neutrophils Relative %: 59 %
Platelets: 262 10*3/uL (ref 150–400)
RBC: 4.44 MIL/uL (ref 3.87–5.11)
RDW: 12.2 % (ref 11.5–15.5)
WBC: 10 10*3/uL (ref 4.0–10.5)
nRBC: 0 % (ref 0.0–0.2)

## 2019-04-06 LAB — LIPASE, BLOOD: Lipase: 27 U/L (ref 11–51)

## 2019-04-06 LAB — POC URINE PREG, ED: Preg Test, Ur: NEGATIVE

## 2019-04-06 IMAGING — CT CT RENAL STONE PROTOCOL
2 of 4 series · 16 of 46 positions shown, 18 images · non-contrast
Comparison: Airway [DATE]

CLINICAL DATA: Left flank pain.  History of stone disease.

EXAM:
CT ABDOMEN AND PELVIS WITHOUT CONTRAST
TECHNIQUE: Multidetector CT imaging of the abdomen and pelvis was performed
following the standard protocol without IV contrast.

[Series 2: axial st · axial · 0.62mm/px · z∈[-247,+113]mm · 13 of 82 slices shown, 15 images]
[im 5/82  soft-tissue]
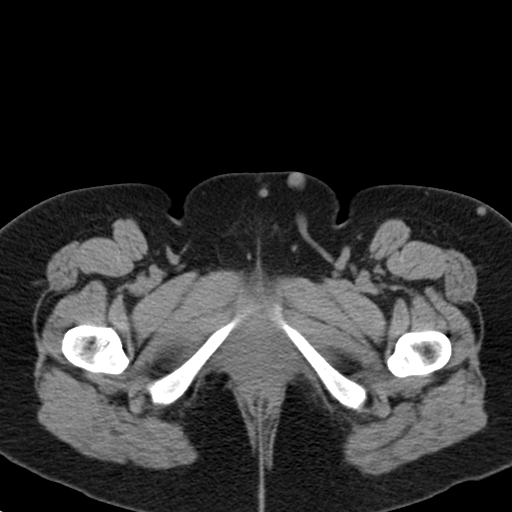
[im 5/82  bone]
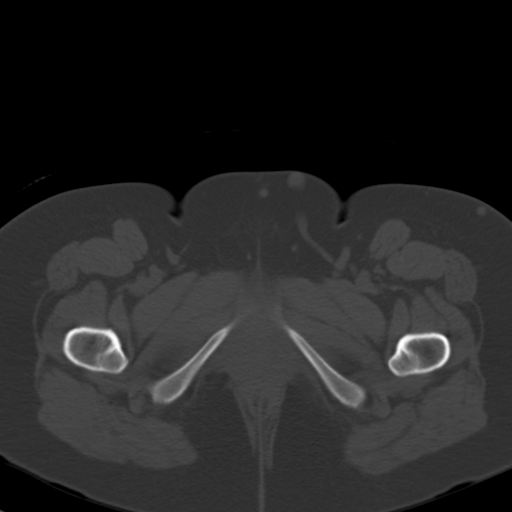
[im 13/82  soft-tissue]
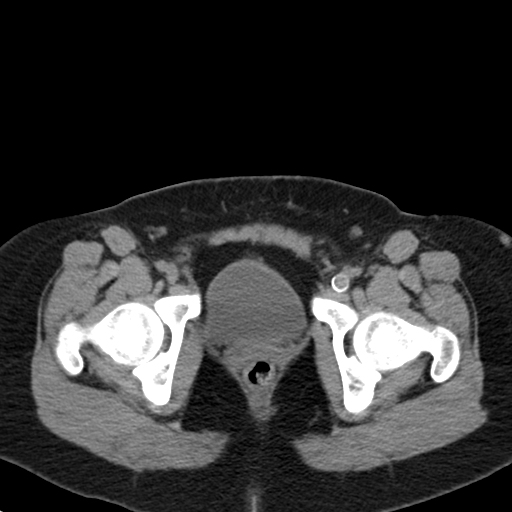
[im 18/82  soft-tissue]
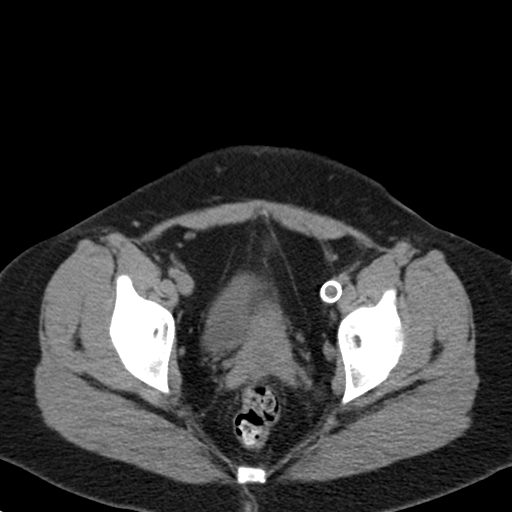
[im 22/82  soft-tissue]
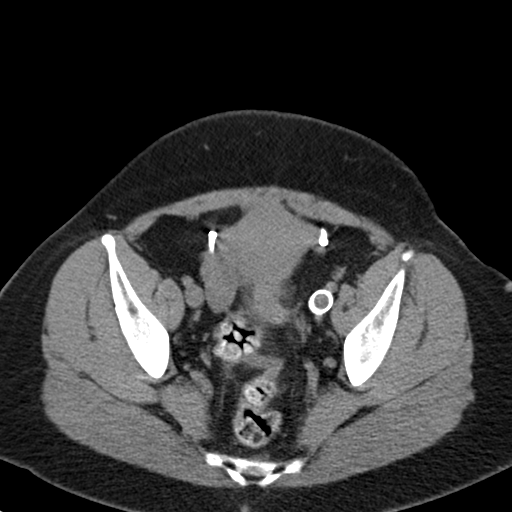
[im 30/82  soft-tissue]
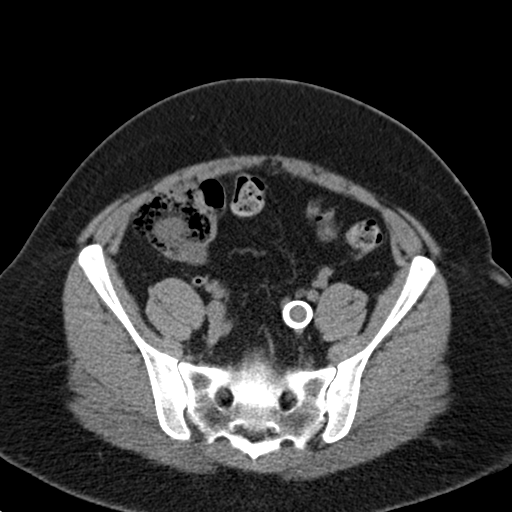
[im 35/82  soft-tissue]
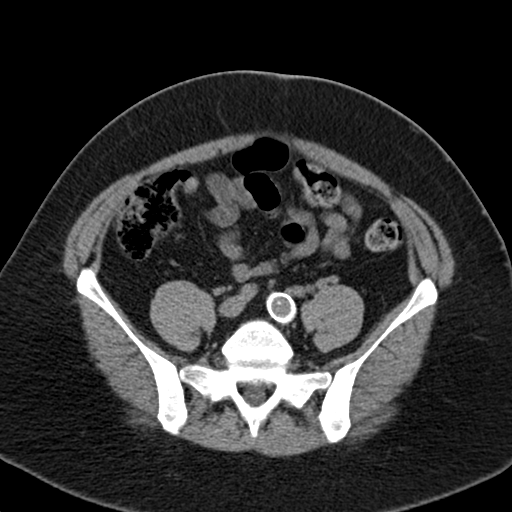
[im 43/82  soft-tissue]
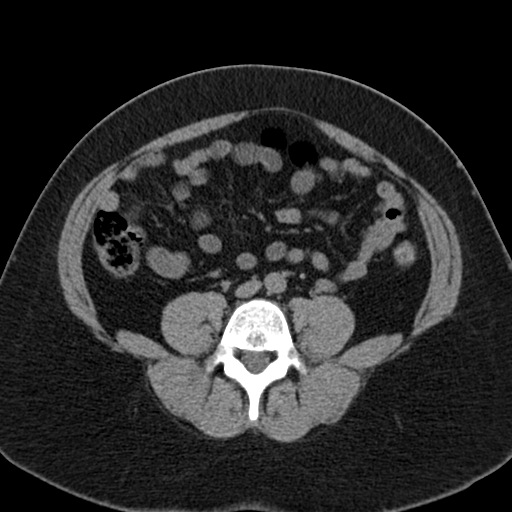
[im 47/82  soft-tissue]
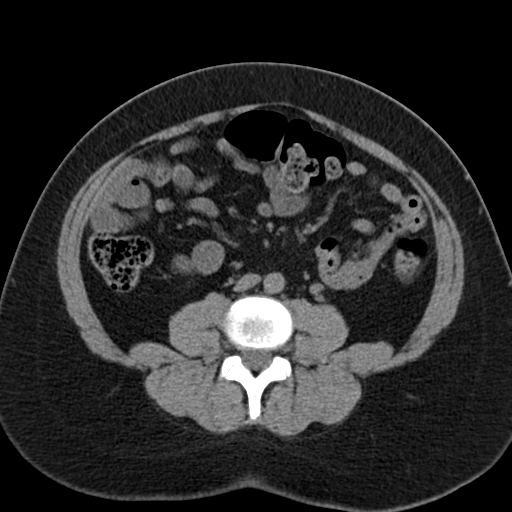
[im 52/82  soft-tissue]
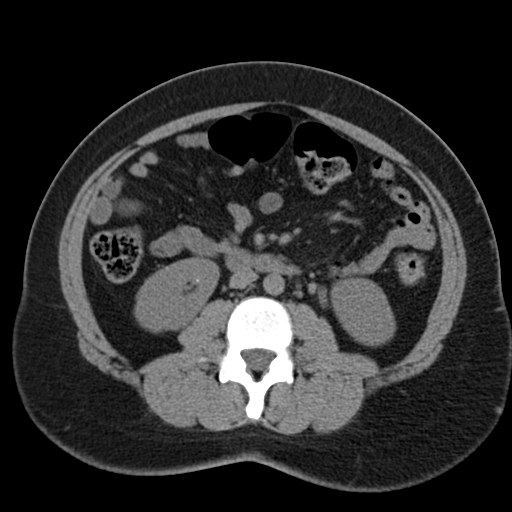
[im 52/82  bone]
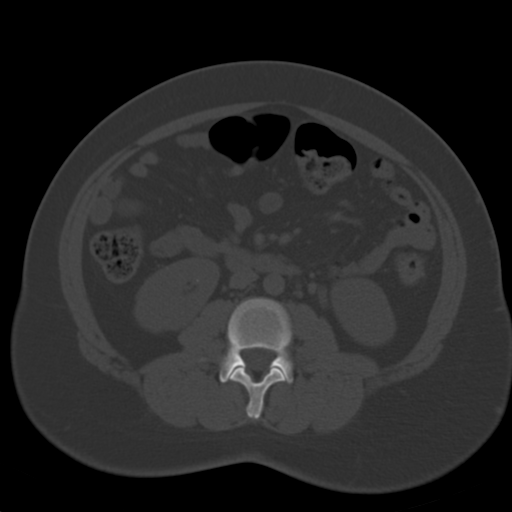
[im 60/82  soft-tissue]
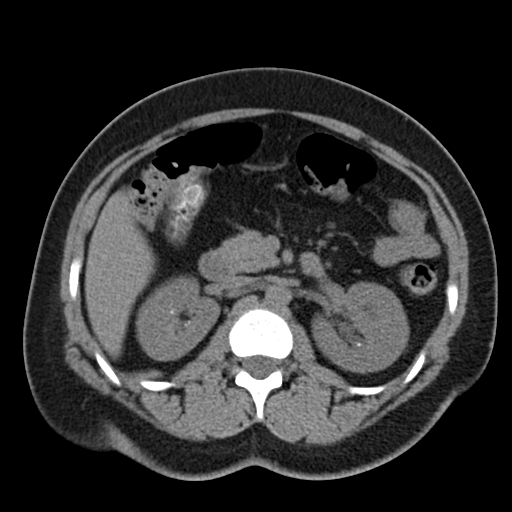
[im 64/82  soft-tissue]
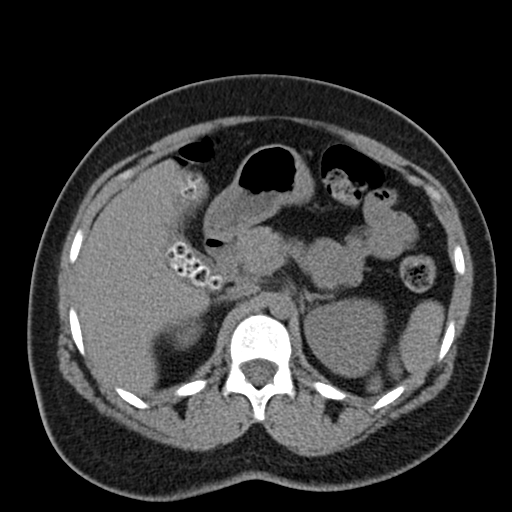
[im 69/82  soft-tissue]
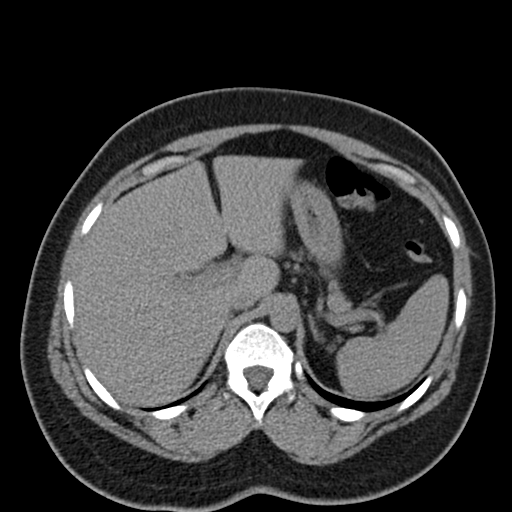
[im 77/82  soft-tissue]
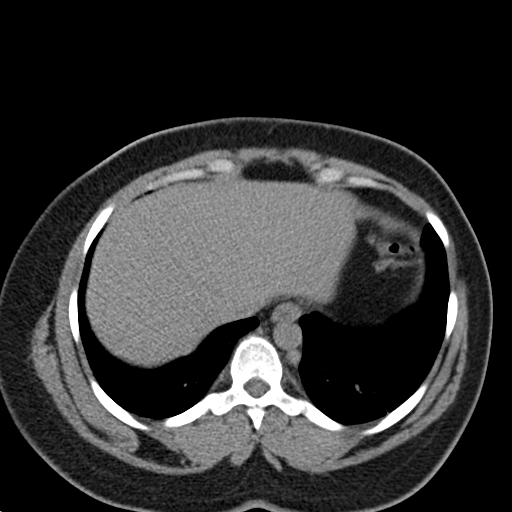

[Series 5: coronal · coronal · 0.63mm/px · 3 of 141 slices shown]
[im 47/141  soft-tissue]
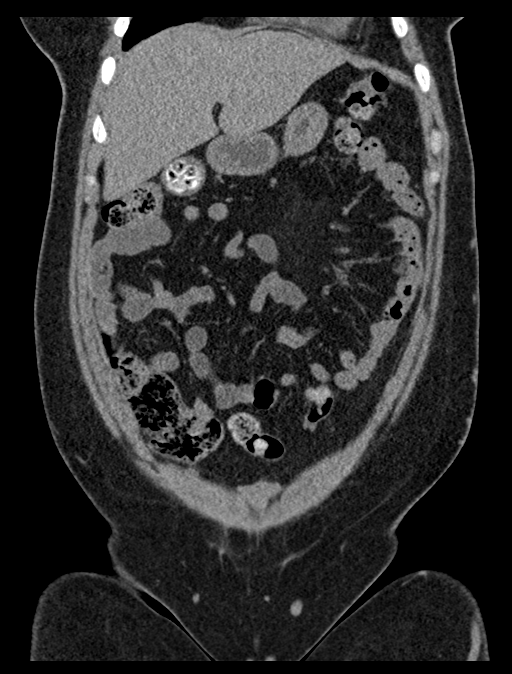
[im 63/141  soft-tissue]
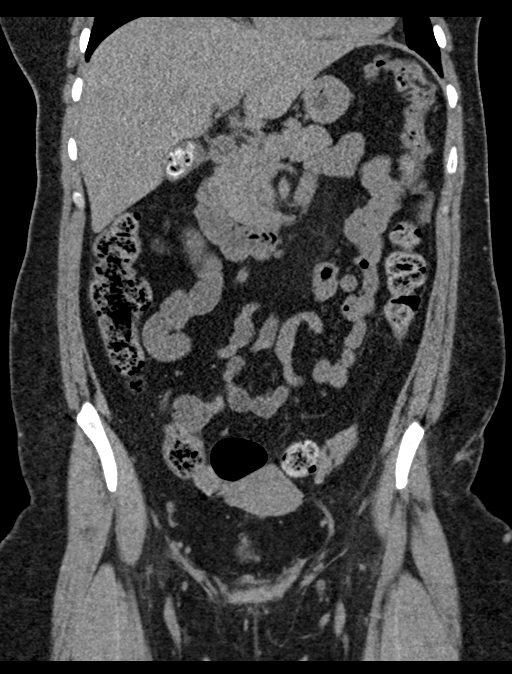
[im 78/141  soft-tissue]
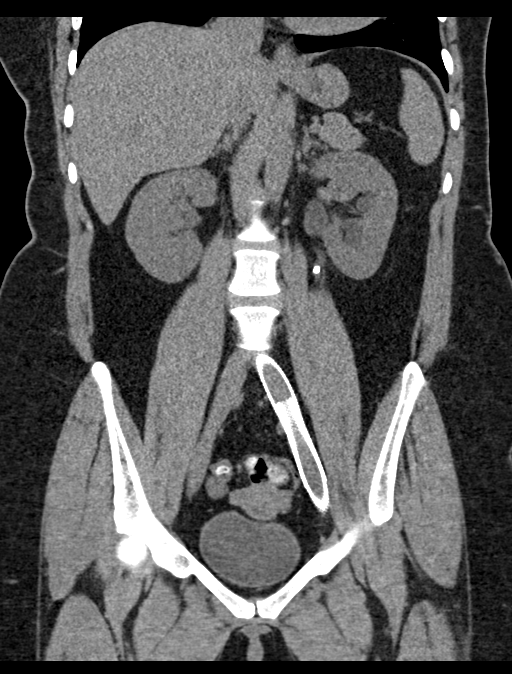

[16 of 46 positions shown; findings below may reference images not displayed]

FINDINGS: Lower chest: Lung bases are clear. No effusions. Heart is normal
size.

Hepatobiliary: Gallbladder filled with gallstones. No biliary ductal
dilatation. No focal hepatic abnormality.

Pancreas: No focal abnormality or ductal dilatation.

Spleen: No focal abnormality.  Normal size.

Adrenals/Urinary Tract: There is mild left hydronephrosis due to a 4
mm proximal left ureteral stone. Multiple punctate nonobstructing
stones within the kidneys bilaterally. No hydronephrosis on the
right. No additional ureteral stones. Urinary bladder and adrenal
glands unremarkable.

Stomach/Bowel: Stomach, large and small bowel grossly unremarkable.
Normal appendix.

Vascular/Lymphatic: No evidence of aneurysm or adenopathy. Left
iliac venous stent noted, stable.

Reproductive: Bilateral tubal ligation clips noted. Uterus and
adnexa unremarkable. No mass.

Other: No free fluid or free air.

Musculoskeletal: No acute bony abnormality.
IMPRESSION: 4 mm proximal left ureteral stone with mild left hydronephrosis.

Bilateral nephrolithiasis.

Gallbladder filled with gallstones, stable since prior study. No CT
evidence of acute cholecystitis.

## 2019-04-06 MED ORDER — ONDANSETRON HCL 4 MG PO TABS: 4.0000 mg | ORAL_TABLET | Freq: Three times a day (TID) | ORAL | 0 refills | Status: DC | PRN

## 2019-04-06 MED ORDER — ONDANSETRON HCL 4 MG/2ML IJ SOLN
4.0000 mg | Freq: Once | INTRAMUSCULAR | Status: AC
  Administered 2019-04-06: 4 mg via INTRAVENOUS
  Filled 2019-04-06: qty 2

## 2019-04-06 MED ORDER — HYDROMORPHONE HCL 1 MG/ML IJ SOLN
1.0000 mg | Freq: Once | INTRAMUSCULAR | Status: AC
  Administered 2019-04-06: 1 mg via INTRAVENOUS
  Filled 2019-04-06: qty 1

## 2019-04-06 MED ORDER — TAMSULOSIN HCL 0.4 MG PO CAPS: 0.4000 mg | ORAL_CAPSULE | Freq: Every day | ORAL | 0 refills | Status: DC

## 2019-04-06 MED ORDER — MORPHINE SULFATE (PF) 4 MG/ML IV SOLN
4.0000 mg | Freq: Once | INTRAVENOUS | Status: AC
  Administered 2019-04-06: 4 mg via INTRAVENOUS
  Filled 2019-04-06: qty 1

## 2019-04-06 MED ORDER — OXYCODONE-ACETAMINOPHEN 5-325 MG PO TABS: 1.0000 | ORAL_TABLET | ORAL | 0 refills | Status: DC | PRN

## 2019-04-06 MED ORDER — KETOROLAC TROMETHAMINE 30 MG/ML IJ SOLN
30.0000 mg | Freq: Once | INTRAMUSCULAR | Status: AC
  Administered 2019-04-06: 30 mg via INTRAVENOUS
  Filled 2019-04-06: qty 1

## 2019-04-06 NOTE — ED Notes (Signed)
Patient transported to CT 

## 2019-04-06 NOTE — ED Notes (Signed)
Patient returned from CT

## 2019-04-06 NOTE — ED Provider Notes (Signed)
[facility] DEPT Provider Note   CSN: [id number] Arrival date & time: 04/06/19  1514     History   Chief Complaint Chief Complaint  Patient presents with  . Flank Pain    HPI Paula Warner is a 33 y.o. female.     The history is provided by the patient and medical records. No language interpreter was used.  Flank Pain This is a recurrent problem. The current episode started 3 to 5 hours ago. The problem occurs constantly. The problem has been gradually improving. Pertinent negatives include no chest pain, no abdominal pain, no headaches and no shortness of breath. Nothing aggravates the symptoms. Nothing relieves the symptoms. She has tried nothing for the symptoms. The treatment provided no relief.    Past Medical History:  Diagnosis Date  . Asthma   . History of asthma    no current med.  Marland Kitchen 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  . History of kidney stones   . Menstrual periods irregular     Patient Active Problem List   Diagnosis Date Noted  . 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 ESRD (end stage renal disease) ([facility]) 07/11/2016  . DVT (deep venous thrombosis) ([facility]) 07/11/2016  . DVT of lower extremity (deep venous thrombosis) ([facility]) 01/24/2016  . High risk sexual behavior 08/04/2015    Past Surgical History:  Procedure Laterality Date  . CESAREAN SECTION     x 2  . LAPAROSCOPIC TUBAL LIGATION Bilateral 05/08/2017   Procedure: LAPAROSCOPIC TUBAL LIGATION - FILSHIE CLIPS;  Surgeon: Emily Filbert, MD;  Location: [facility];  Service: Gynecology;  Laterality: Bilateral;  . LOWER EXTREMITY ANGIOGRAM Left 07/11/2016   Procedure: Percutaneous Venous Thrombectomy with IVUS and  with Stent Placement;  Surgeon: Serafina Mitchell, MD;  Location: [facility];  Service: Vascular;  Laterality: Left;     OB History    Gravida  3   Para  1   Term      Preterm      AB  1   Living  2     SAB      TAB  1   Ectopic      Multiple  0   Live Births  2            Home Medications    Prior to Admission medications   Medication Sig Start Date End Date Taking? Authorizing Provider  cyclobenzaprine (FLEXERIL) 10 MG tablet Take 1 tablet (10 mg total) by mouth 2 (two) times daily as needed for muscle spasms. Patient not taking: Reported on 08/27/2018 10/31/17   McDonald, Maree Erie A, PA-C  medroxyPROGESTERone (PROVERA) 10 MG tablet Take 1 tablet (10 mg total) by mouth daily. Use for ten days Patient not taking: Reported on 04/06/2019 10/28/18   Leftwich-Kirby, Kathie Dike, CNM  metroNIDAZOLE (FLAGYL) 500 MG tablet Take 1 tablet (500 mg total) by mouth 2 (two) times daily with a meal. DO NOT CONSUME ALCOHOL WHILE TAKING THIS MEDICATION. Patient not taking: Reported on 10/28/2018 08/27/18   Doristine Devoid, PA-C    Family History Family History  Problem Relation Age of Onset  . Heart murmur Mother   . Asthma Mother   . Clotting disorder Father   . Heart disease Father     Social History Social History   Tobacco Use  . Smoking status: Never Smoker  . Smokeless tobacco: Never Used  Substance Use Topics  . Alcohol  use: No    Alcohol/week: 0.0 standard drinks  . Drug use: No     Allergies   Zinacef [cefuroxime]   Review of Systems Review of Systems  Constitutional: Negative for chills, diaphoresis, fatigue and fever.  HENT: Negative for congestion.   Eyes: Negative for visual disturbance.  Respiratory: Negative for cough, chest tightness, shortness of breath and wheezing.   Cardiovascular: Negative for chest pain, palpitations and leg swelling.  Gastrointestinal: Positive for nausea. Negative for abdominal pain, constipation, diarrhea and vomiting.  Genitourinary: Positive for flank pain. Negative for decreased urine volume, dysuria, urgency, vaginal bleeding, vaginal discharge and vaginal pain.  Musculoskeletal: Positive for back pain (L flank). Negative for neck pain  and neck stiffness.  Skin: Negative for rash and wound.  Neurological: Negative for dizziness, weakness, light-headedness and headaches.  Psychiatric/Behavioral: Negative for agitation.  All other systems reviewed and are negative.    Physical Exam Updated Vital Signs BP 118/79   Pulse 61   Temp 98.3 F (36.8 C) (Oral)   Resp 17   SpO2 100%   Physical Exam Vitals signs and nursing note reviewed.  Constitutional:      General: She is not in acute distress.    Appearance: She is well-developed. She is not ill-appearing or diaphoretic.  HENT:     Head: Normocephalic and atraumatic.  Eyes:     Conjunctiva/sclera: Conjunctivae normal.     Pupils: Pupils are equal, round, and reactive to light.  Neck:     Musculoskeletal: Neck supple.  Cardiovascular:     Rate and Rhythm: Normal rate and regular rhythm.     Heart sounds: No murmur.  Pulmonary:     Effort: Pulmonary effort is normal. No respiratory distress.     Breath sounds: Normal breath sounds. No wheezing, rhonchi or rales.  Chest:     Chest wall: No tenderness.  Abdominal:     General: There is no distension.     Palpations: Abdomen is soft.     Tenderness: There is abdominal tenderness (L flank). There is no right CVA tenderness or left CVA tenderness.  Musculoskeletal:        General: Tenderness present.     Right lower leg: No edema.     Left lower leg: No edema.  Skin:    General: Skin is warm and dry.     Capillary Refill: Capillary refill takes less than 2 seconds.  Neurological:     General: No focal deficit present.     Mental Status: She is alert.  Psychiatric:        Mood and Affect: Mood normal.      ED Treatments / Results  Labs (all labs ordered are listed, but only abnormal results are displayed) Labs Reviewed  URINALYSIS, ROUTINE W REFLEX MICROSCOPIC - Abnormal; Notable for the following components:      Result Value   APPearance HAZY (*)    Hgb urine dipstick SMALL (*)    Bacteria, UA  RARE (*)    All other components within normal limits  COMPREHENSIVE METABOLIC PANEL - Abnormal; Notable for the following components:   Total Protein 8.4 (*)    ALT 82 (*)    All other components within normal limits  URINE CULTURE  CBC WITH DIFFERENTIAL/PLATELET  LIPASE, BLOOD  POC URINE PREG, ED    EKG None  Radiology Ct Renal Stone Study  Result Date: 04/06/2019 CLINICAL DATA:  Left flank pain.  History of stone disease. EXAM: CT ABDOMEN AND  PELVIS WITHOUT CONTRAST TECHNIQUE: Multidetector CT imaging of the abdomen and pelvis was performed following the standard protocol without IV contrast. COMPARISON:  Airway 04/12/2018 FINDINGS: Lower chest: Lung bases are clear. No effusions. Heart is normal size. Hepatobiliary: Gallbladder filled with gallstones. No biliary ductal dilatation. No focal hepatic abnormality. Pancreas: No focal abnormality or ductal dilatation. Spleen: No focal abnormality.  Normal size. Adrenals/Urinary Tract: There is mild left hydronephrosis due to a 4 mm proximal left ureteral stone. Multiple punctate nonobstructing stones within the kidneys bilaterally. No hydronephrosis on the right. No additional ureteral stones. Urinary bladder and adrenal glands unremarkable. Stomach/Bowel: Stomach, large and small bowel grossly unremarkable. Normal appendix. Vascular/Lymphatic: No evidence of aneurysm or adenopathy. Left iliac venous stent noted, stable. Reproductive: Bilateral tubal ligation clips noted. Uterus and adnexa unremarkable. No mass. Other: No free fluid or free air. Musculoskeletal: No acute bony abnormality. IMPRESSION: 4 mm proximal left ureteral stone with mild left hydronephrosis. Bilateral nephrolithiasis. Gallbladder filled with gallstones, stable since prior study. No CT evidence of acute cholecystitis. Electronically Signed   By: Rolm Baptise M.D.   On: 04/06/2019 18:29    Procedures Procedures (including critical care time)  Medications Ordered in ED  Medications  morphine 4 MG/ML injection 4 mg (4 mg Intravenous Given 04/06/19 1757)  HYDROmorphone (DILAUDID) injection 1 mg (1 mg Intravenous Given 04/06/19 2008)  ketorolac (TORADOL) 30 MG/ML injection 30 mg (30 mg Intravenous Given 04/06/19 2008)     Initial Impression / Assessment and Plan / ED Course  I have reviewed the triage vital signs and the nursing notes.  Pertinent labs & imaging results that were available during my care of the patient were reviewed by me and considered in my medical decision making (see chart for details).        Paula Warner is a 33 y.o. female with a past medical history significant for prior kidney stones, asthma, and prior DVT who presents with left leg pain consistent with prior kidney stone.  Patient reports that today at work, patient started having severe left flank pain rating from her back around towards her left abdomen.  She describes as 10 at its worst is currently 6/10.  She denies any hematuria, dysuria, or change in urination.  She denies fevers, chills.  She reports a mild nausea but no vomiting.  She denies any trauma but reports the pain worsened when she initially bent over.  She denies other complaints.  On exam, mild tenderness in her left flank, no CVA tenderness on the left.  No significant tenderness in the rest of the abdomen.  Lungs clear and chest nontender.  Exam otherwise unremarkable.  Shared decision made conversation held with patient we elected to pursue labs, urinalysis, and a CT stone study to look for large stone.  Patient was given pain medicine.  Anticipate reassessment after work-up.  Suspect patient be stable for discharge home with a kidney stone discovered.       10:42 PM Patient CT scan confirmed a 4 mm stone in the left ureter.  Mild amount of hydronephrosis but labs showed there was no infection and no elevation in kidney function.  Patient's pain was controlled with Dilaudid and Toradol.  Although the stone is  in the proximal ureter, do not feel she needs urology consultation or admission at this time as her pain is controlled.  Patient will give prescription for pain medicine, nausea, and Flomax and will follow-up with urology and PCP.  Patient understood plan of  care as well as return precautions.  She had no other questions or concerns and was discharged in good condition with resolution of her flank pain.   Final Clinical Impressions(s) / ED Diagnoses   Final diagnoses:  Kidney stone    ED Discharge Orders         Ordered    oxyCODONE-acetaminophen (PERCOCET/ROXICET) 5-325 MG tablet  Every 4 hours PRN     04/06/19 2247    ondansetron (ZOFRAN) 4 MG tablet  Every 8 hours PRN     04/06/19 2247    tamsulosin (FLOMAX) 0.4 MG CAPS capsule  Daily     04/06/19 2247          Clinical Impression: 1. Kidney stone     Disposition: Discharge  Condition: Good  I have discussed the results, Dx and Tx plan with the pt(& family if present). He/she/they expressed understanding and agree(s) with the plan. Discharge instructions discussed at great length. Strict return precautions discussed and pt &/or family have verbalized understanding of the instructions. No further questions at time of discharge.    New Prescriptions   ONDANSETRON (ZOFRAN) 4 MG TABLET    Take 1 tablet (4 mg total) by mouth every 8 (eight) hours as needed for nausea or vomiting.   OXYCODONE-ACETAMINOPHEN (PERCOCET/ROXICET) 5-325 MG TABLET    Take 1 tablet by mouth every 4 (four) hours as needed for severe pain.   TAMSULOSIN (FLOMAX) 0.4 MG CAPS CAPSULE    Take 1 capsule (0.4 mg total) by mouth daily.    Follow Up: Irine Seal, MD [place] [address] [facility] DEPT [place] [id number] [place] [address] [address], [place], [place] [address] [phone]        Tegeler,  Gwenyth Allegra, MD 04/06/19 [phone]

## 2019-04-06 NOTE — Discharge Instructions (Signed)
Your work-up today showed evidence of a kidney stone causing your symptoms.  There was no evidence of kidney injury or infection involved with the stone.  As your symptoms improve, we feel you are safe for outpatient management of the stone.  Please use the pain medicine, nausea medicine, and Flomax to help pass the stone.  Please stay hydrated and rest.  Please follow-up with your PCP and urology.  If any symptoms change or worsen, please return to the nearest emergency department.

## 2019-04-06 NOTE — ED Triage Notes (Signed)
Pt c/o left flank pain that started today. Denies urinary or bowel problems. Hx kidney stones.

## 2019-04-07 ENCOUNTER — Encounter (HOSPITAL_COMMUNITY): Payer: Self-pay

## 2019-04-07 ENCOUNTER — Emergency Department (HOSPITAL_COMMUNITY)
Admission: EM | Admit: 2019-04-07 | Discharge: 2019-04-07 | Discharge status: Against Medical Advice | Payer: Medicaid Other | Attending: Emergency Medicine | Admitting: Emergency Medicine

## 2019-04-07 DIAGNOSIS — Z5321 Procedure and treatment not carried out due to patient leaving prior to being seen by health care provider: Secondary | ICD-10-CM | POA: Insufficient documentation

## 2019-04-07 LAB — URINE CULTURE

## 2019-04-07 NOTE — ED Triage Notes (Addendum)
Pt c/o left sided flank pain r/t kidney stones. Pt was seen last night for same. Pt tried 1 dose of home meds without relief and decided to return for pain control. Pt has not passed stone.   Pt states "the only thing that helps my pain is the toradol".

## 2019-04-09 ENCOUNTER — Emergency Department (HOSPITAL_COMMUNITY)
Admission: EM | Admit: 2019-04-09 | Discharge: 2019-04-09 | Disposition: A | Discharge status: Home / Self Care | Payer: Medicaid Other | Attending: Emergency Medicine | Admitting: Emergency Medicine

## 2019-04-09 ENCOUNTER — Other Ambulatory Visit: Payer: Self-pay

## 2019-04-09 ENCOUNTER — Encounter (HOSPITAL_COMMUNITY): Payer: Self-pay

## 2019-04-09 DIAGNOSIS — R109 Unspecified abdominal pain: Secondary | ICD-10-CM

## 2019-04-09 DIAGNOSIS — N200 Calculus of kidney: Secondary | ICD-10-CM | POA: Insufficient documentation

## 2019-04-09 DIAGNOSIS — Z79899 Other long term (current) drug therapy: Secondary | ICD-10-CM | POA: Insufficient documentation

## 2019-04-09 LAB — URINALYSIS, ROUTINE W REFLEX MICROSCOPIC
Bilirubin Urine: NEGATIVE
Glucose, UA: NEGATIVE mg/dL
Hgb urine dipstick: NEGATIVE
Ketones, ur: NEGATIVE mg/dL
Nitrite: NEGATIVE
Protein, ur: NEGATIVE mg/dL
Specific Gravity, Urine: 1.028 (ref 1.005–1.030)
pH: 5 (ref 5.0–8.0)

## 2019-04-09 LAB — CBC WITH DIFFERENTIAL/PLATELET
Abs Immature Granulocytes: 0.02 10*3/uL (ref 0.00–0.07)
Basophils Absolute: 0 10*3/uL (ref 0.0–0.1)
Basophils Relative: 1 %
Eosinophils Absolute: 0.2 10*3/uL (ref 0.0–0.5)
Eosinophils Relative: 2 %
HCT: 43.5 % (ref 36.0–46.0)
Hemoglobin: 13.7 g/dL (ref 12.0–15.0)
Immature Granulocytes: 0 %
Lymphocytes Relative: 26 %
Lymphs Abs: 2.2 10*3/uL (ref 0.7–4.0)
MCH: 30.9 pg (ref 26.0–34.0)
MCHC: 31.5 g/dL (ref 30.0–36.0)
MCV: 98.2 fL (ref 80.0–100.0)
Monocytes Absolute: 0.9 10*3/uL (ref 0.1–1.0)
Monocytes Relative: 11 %
Neutro Abs: 5.1 10*3/uL (ref 1.7–7.7)
Neutrophils Relative %: 60 %
Platelets: 248 10*3/uL (ref 150–400)
RBC: 4.43 MIL/uL (ref 3.87–5.11)
RDW: 12.2 % (ref 11.5–15.5)
WBC: 8.5 10*3/uL (ref 4.0–10.5)
nRBC: 0 % (ref 0.0–0.2)

## 2019-04-09 LAB — BASIC METABOLIC PANEL
Anion gap: 12 (ref 5–15)
BUN: 13 mg/dL (ref 6–20)
CO2: 28 mmol/L (ref 22–32)
Calcium: 9.2 mg/dL (ref 8.9–10.3)
Chloride: 102 mmol/L (ref 98–111)
Creatinine, Ser: 0.75 mg/dL (ref 0.44–1.00)
GFR calc Af Amer: >60 mL/min (ref >60)
GFR calc non Af Amer: >60 mL/min (ref >60)
Glucose, Bld: 97 mg/dL (ref 70–99)
Potassium: 3.7 mmol/L (ref 3.5–5.1)
Sodium: 142 mmol/L (ref 135–145)

## 2019-04-09 MED ORDER — HYDROMORPHONE HCL 1 MG/ML IJ SOLN
1.0000 mg | Freq: Once | INTRAMUSCULAR | Status: AC
  Administered 2019-04-09: 1 mg via INTRAVENOUS
  Filled 2019-04-09: qty 1

## 2019-04-09 MED ORDER — KETOROLAC TROMETHAMINE 10 MG PO TABS: 10.0000 mg | ORAL_TABLET | Freq: Four times a day (QID) | ORAL | 0 refills | Status: DC | PRN

## 2019-04-09 MED ORDER — ONDANSETRON HCL 4 MG/2ML IJ SOLN
4.0000 mg | Freq: Once | INTRAMUSCULAR | Status: AC
  Administered 2019-04-09: 4 mg via INTRAVENOUS
  Filled 2019-04-09: qty 2

## 2019-04-09 MED ORDER — KETOROLAC TROMETHAMINE 30 MG/ML IJ SOLN
30.0000 mg | Freq: Once | INTRAMUSCULAR | Status: AC
  Administered 2019-04-09: 30 mg via INTRAVENOUS
  Filled 2019-04-09: qty 1

## 2019-04-09 MED ORDER — SODIUM CHLORIDE 0.9 % IV BOLUS
1000.0000 mL | Freq: Once | INTRAVENOUS | Status: AC
  Administered 2019-04-09: 1000 mL via INTRAVENOUS

## 2019-04-09 NOTE — Discharge Instructions (Addendum)
Your labs are reassuring today.  Paula Warner is likely moving and this is causing your worsening pain.  You can take Toradol every 6 hours, Percocet as needed for breakthrough pain.  Continue using daily Flomax to help ease passing of stone.  Please call to schedule follow-up appointment with urologist.  Return for worsening pain, fevers or any other new or concerning symptoms.

## 2019-04-09 NOTE — ED Triage Notes (Signed)
Pt arrives [facility] for left flank pain related to kidney stones. Pt took Rx pain meds @ 0500- continues to have severe pain.

## 2019-04-09 NOTE — ED Provider Notes (Signed)
[facility] DEPT Provider Note   CSN: [id number] Arrival date & time: 04/09/19  2229    History   Chief Complaint Chief Complaint  Patient presents with   Flank Pain    left    HPI Paula Warner is a 33 y.o. female.     Paula Warner is a 33 y.o. female with a history of kidney stones, DVT, asthma, who presents to the ED for evaluation of continued left flank pain, she was seen on 7/13 and diagnosed with a left-sided proximal 4 mm ureteral stone, she returns today due to worsening flank pain.  She reports that she was managing well at home with Percocet and Flomax but this morning at about 5 AM she had significant worsening of her left flank pain. She has not passed anything yet. She reports associated nausea but no vomiting.  No fevers or chills.  No burning or pain with urination, no hematuria.  She called the urologist to attempt to schedule an appointment but could not get through on the phone.  She tried to come back in on the 14th but could not wait longer to be seen.  Reports she is supposed to return to work today but reports she could not due to worsening pain.     Past Medical History:  Diagnosis Date   Asthma    History of asthma    no current med.   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   ESRD (end stage renal disease) ([facility]) 07/11/2016   DVT (deep venous thrombosis) ([facility]) 07/11/2016   DVT of lower extremity (deep venous thrombosis) ([facility]) 01/24/2016   High risk sexual behavior 08/04/2015    Past Surgical History:  Procedure Laterality Date   CESAREAN SECTION     x 2   LAPAROSCOPIC TUBAL LIGATION Bilateral 05/08/2017   Procedure: LAPAROSCOPIC TUBAL LIGATION - FILSHIE CLIPS;  Surgeon: Emily Filbert, MD;  Location: [facility];  Service: Gynecology;  Laterality: Bilateral;   LOWER EXTREMITY ANGIOGRAM Left 07/11/2016   Procedure: Percutaneous Venous Thrombectomy with IVUS and  with Stent Placement;  Surgeon: Serafina Mitchell, MD;  Location[facility];  Service: Vascular;  Laterality: Left;     OB History    Gravida  3   Para  1   Term      Preterm      AB  1   Living  2     SAB      TAB  1   Ectopic      Multiple  0   Live Births  2            Home Medications    Prior to Admission medications   Medication Sig Start Date End Date Taking? Authorizing Provider  cyclobenzaprine (FLEXERIL) 10 MG tablet Take 1 tablet (10 mg total) by mouth 2 (two) times daily as needed for muscle spasms. Patient not taking: Reported on 08/27/2018 10/31/17   McDonald, Maree Erie A, PA-C  medroxyPROGESTERone (PROVERA) 10 MG tablet Take 1 tablet (10 mg total) by mouth daily. Use for ten days Patient not taking: Reported on 04/06/2019 10/28/18   Fatima Blank A, CNM  metroNIDAZOLE (FLAGYL) 500 MG tablet Take 1 tablet (500 mg total) by mouth 2 (two) times  daily with a meal. DO NOT CONSUME ALCOHOL WHILE TAKING THIS MEDICATION. Patient not taking: Reported on 10/28/2018 08/27/18   Ocie Cornfield T, PA-C  ondansetron (ZOFRAN) 4 MG tablet Take 1 tablet (4 mg total) by mouth every 8 (eight) hours as needed for nausea or vomiting. 04/06/19   Tegeler, Gwenyth Allegra, MD  oxyCODONE-acetaminophen (PERCOCET/ROXICET) 5-325 MG tablet Take 1 tablet by mouth every 4 (four) hours as needed for severe pain. 04/06/19   Tegeler, Gwenyth Allegra, MD  tamsulosin (FLOMAX) 0.4 MG CAPS capsule Take 1 capsule (0.4 mg total) by mouth daily. 04/06/19   Tegeler, Gwenyth Allegra, MD    Family History Family History  Problem Relation Age of Onset   Heart murmur Mother    Asthma Mother    Clotting disorder Father    Heart disease Father     Social History Social History   Tobacco Use   Smoking status: Never Smoker   Smokeless  tobacco: Never Used  Substance Use Topics   Alcohol use: No    Alcohol/week: 0.0 standard drinks   Drug use: No     Allergies   Zinacef [cefuroxime]   Review of Systems Review of Systems  Constitutional: Negative for chills and fever.  HENT: Negative.   Respiratory: Negative for cough and shortness of breath.   Cardiovascular: Negative for chest pain.  Gastrointestinal: Positive for nausea. Negative for abdominal pain and vomiting.  Genitourinary: Positive for flank pain. Negative for dysuria, frequency and hematuria.  Musculoskeletal: Negative for back pain and myalgias.  Skin: Negative for color change and rash.  Neurological: Negative for dizziness, syncope and light-headedness.  All other systems reviewed and are negative.    Physical Exam Updated Vital Signs BP 114/79    Pulse 83    Temp 98.9 F (37.2 C) (Oral)    Resp 16    Ht 5\' 4"  (1.626 m)    Wt 71 kg    SpO2 95%    BMI 26.87 kg/m   Physical Exam Vitals signs and nursing note reviewed.  Constitutional:      General: She is not in acute distress.    Appearance: Normal appearance. She is well-developed. She is not ill-appearing or diaphoretic.  HENT:     Head: Normocephalic and atraumatic.     Mouth/Throat:     Mouth: Mucous membranes are moist.     Pharynx: Oropharynx is clear.  Eyes:     General:        Right eye: No discharge.        Left eye: No discharge.     Pupils: Pupils are equal, round, and reactive to light.  Neck:     Musculoskeletal: Neck supple.  Cardiovascular:     Rate and Rhythm: Normal rate and regular rhythm.     Heart sounds: Normal heart sounds. No murmur. No friction rub. No gallop.   Pulmonary:     Effort: Pulmonary effort is normal. No respiratory distress.     Breath sounds: Normal breath sounds. No wheezing or rales.     Comments: Respirations equal and unlabored, patient able to speak in full sentences, lungs clear to auscultation bilaterally Abdominal:     General: Bowel  sounds are normal. There is no distension.     Palpations: Abdomen is soft. There is no mass.     Tenderness: There is no abdominal tenderness. There is no guarding.     Comments: Abdomen soft, nondistended, nontender to palpation in all quadrants without guarding or peritoneal  signs, pain over left flank, but no CVA tenderness  Musculoskeletal:        General: No deformity.     Right lower leg: No edema.     Left lower leg: No edema.  Skin:    General: Skin is warm and dry.     Capillary Refill: Capillary refill takes less than 2 seconds.  Neurological:     Mental Status: She is alert.     Coordination: Coordination normal.     Comments: Speech is clear, able to follow commands Moves extremities without ataxia, coordination intact  Psychiatric:        Mood and Affect: Mood normal.        Behavior: Behavior normal.      ED Treatments / Results  Labs (all labs ordered are listed, but only abnormal results are displayed) Labs Reviewed  URINALYSIS, ROUTINE W REFLEX MICROSCOPIC - Abnormal; Notable for the following components:      Result Value   APPearance CLOUDY (*)    Leukocytes,Ua TRACE (*)    Bacteria, UA RARE (*)    All other components within normal limits  BASIC METABOLIC PANEL  CBC WITH DIFFERENTIAL/PLATELET    EKG None  Radiology No results found.  Procedures Procedures (including critical care time)  Medications Ordered in ED Medications  HYDROmorphone (DILAUDID) injection 1 mg (1 mg Intravenous Given 04/09/19 0747)  sodium chloride 0.9 % bolus 1,000 mL (1,000 mLs Intravenous New Bag/Given 04/09/19 0747)  ondansetron (ZOFRAN) injection 4 mg (4 mg Intravenous Given 04/09/19 0747)     Initial Impression / Assessment and Plan / ED Course  I have reviewed the triage vital signs and the nursing notes.  Pertinent labs & imaging results that were available during my care of the patient were reviewed by me and considered in my medical decision making (see chart  for details).  Patient presents with return and worsening of left flank pain, was diagnosed with kidney stone on 7/13, noted to have a 4 mm stone in the proximal ureter at this time with mild hydro-but no signs of infection normal kidney function.  Suspect that stone has likely traveled down to the UVJ now causing worsening pain.  Will recheck urinalysis and kidney function and work on pain control.  If patient has elevated kidney function, signs of infection or pain is unable to be controlled may need to repeat imaging but will hold off at this time.  Labs reassuring, no leukocytosis, normal hemoglobin.  No elevation in creatinine and electrolytes normal.  Awaiting urinalysis.  Will give Toradol for additional pain control.  On reevaluation patient is sitting comfortably in bed, reports significant improvement in her pain.  Urine sample collected as long as this shows no evidence of infection feel that patient will be stable for discharge home with continued outpatient treatment and close follow-up with urology.  Urinalysis shows no evidence of infection.  Patient reports pain has significantly improved.  Patient reports that she has taken p.o. Toradol at home in addition to narcotic pain medication when she has had kidney stones and has tolerated this medication well, will send in prescription for this, she still has 10 Percocet at home as well as plenty of Flomax.  Will have patient follow-up with urology.  Return precautions discussed.  Patient expresses understanding and agreement with plan.  Discharged home in good condition.  Final Clinical Impressions(s) / ED Diagnoses   Final diagnoses:  Left flank pain  Kidney stone    ED Discharge  Orders         Ordered    ketorolac (TORADOL) 10 MG tablet  Every 6 hours PRN     04/09/19 0947           Jacqlyn Larsen, PA-C 04/09/19 3730    Margette Fast, MD 04/09/19 [phone]

## 2019-11-22 ENCOUNTER — Emergency Department (HOSPITAL_COMMUNITY)
Admission: EM | Admit: 2019-11-22 | Discharge: 2019-11-22 | Disposition: A | Discharge status: Home / Self Care | Payer: Medicaid Other | Attending: Emergency Medicine | Admitting: Emergency Medicine

## 2019-11-22 ENCOUNTER — Encounter (HOSPITAL_COMMUNITY): Payer: Self-pay | Admitting: Student

## 2019-11-22 ENCOUNTER — Other Ambulatory Visit: Payer: Self-pay

## 2019-11-22 DIAGNOSIS — Z79899 Other long term (current) drug therapy: Secondary | ICD-10-CM | POA: Insufficient documentation

## 2019-11-22 DIAGNOSIS — R7989 Other specified abnormal findings of blood chemistry: Secondary | ICD-10-CM

## 2019-11-22 DIAGNOSIS — J45909 Unspecified asthma, uncomplicated: Secondary | ICD-10-CM | POA: Insufficient documentation

## 2019-11-22 DIAGNOSIS — N23 Unspecified renal colic: Secondary | ICD-10-CM

## 2019-11-22 LAB — CBC WITH DIFFERENTIAL/PLATELET
Abs Immature Granulocytes: 0.01 10*3/uL (ref 0.00–0.07)
Basophils Absolute: 0.1 10*3/uL (ref 0.0–0.1)
Basophils Relative: 1 %
Eosinophils Absolute: 0.1 10*3/uL (ref 0.0–0.5)
Eosinophils Relative: 2 %
HCT: 41 % (ref 36.0–46.0)
Hemoglobin: 13.8 g/dL (ref 12.0–15.0)
Immature Granulocytes: 0 %
Lymphocytes Relative: 52 %
Lymphs Abs: 3.5 10*3/uL (ref 0.7–4.0)
MCH: 31.9 pg (ref 26.0–34.0)
MCHC: 33.7 g/dL (ref 30.0–36.0)
MCV: 94.9 fL (ref 80.0–100.0)
Monocytes Absolute: 0.8 10*3/uL (ref 0.1–1.0)
Monocytes Relative: 11 %
Neutro Abs: 2.3 10*3/uL (ref 1.7–7.7)
Neutrophils Relative %: 34 %
Platelets: 168 10*3/uL (ref 150–400)
RBC: 4.32 MIL/uL (ref 3.87–5.11)
RDW: 13 % (ref 11.5–15.5)
WBC: 6.7 10*3/uL (ref 4.0–10.5)
nRBC: 0 % (ref 0.0–0.2)

## 2019-11-22 LAB — URINALYSIS, ROUTINE W REFLEX MICROSCOPIC
Bilirubin Urine: NEGATIVE
Glucose, UA: NEGATIVE mg/dL
Ketones, ur: NEGATIVE mg/dL
Leukocytes,Ua: NEGATIVE
Nitrite: NEGATIVE
Protein, ur: NEGATIVE mg/dL
Specific Gravity, Urine: 1.014 (ref 1.005–1.030)
pH: 6 (ref 5.0–8.0)

## 2019-11-22 LAB — COMPREHENSIVE METABOLIC PANEL
ALT: 63 U/L — ABNORMAL HIGH (ref 0–44)
AST: 44 U/L — ABNORMAL HIGH (ref 15–41)
Albumin: 3.9 g/dL (ref 3.5–5.0)
Alkaline Phosphatase: 105 U/L (ref 38–126)
Anion gap: 8 (ref 5–15)
BUN: 12 mg/dL (ref 6–20)
CO2: 25 mmol/L (ref 22–32)
Calcium: 8.9 mg/dL (ref 8.9–10.3)
Chloride: 108 mmol/L (ref 98–111)
Creatinine, Ser: 0.71 mg/dL (ref 0.44–1.00)
GFR calc Af Amer: >60 mL/min (ref >60)
GFR calc non Af Amer: >60 mL/min (ref >60)
Glucose, Bld: 112 mg/dL — ABNORMAL HIGH (ref 70–99)
Potassium: 3.5 mmol/L (ref 3.5–5.1)
Sodium: 141 mmol/L (ref 135–145)
Total Bilirubin: 2.2 mg/dL — ABNORMAL HIGH (ref 0.3–1.2)
Total Protein: 7.5 g/dL (ref 6.5–8.1)

## 2019-11-22 LAB — I-STAT BETA HCG BLOOD, ED (MC, WL, AP ONLY): I-stat hCG, quantitative: <5 m[IU]/mL (ref <5)

## 2019-11-22 MED ORDER — IBUPROFEN 600 MG PO TABS: 600.0000 mg | ORAL_TABLET | Freq: Three times a day (TID) | ORAL | 0 refills | Status: DC | PRN

## 2019-11-22 MED ORDER — HYDROCODONE-ACETAMINOPHEN 5-325 MG PO TABS: 1.0000 | ORAL_TABLET | Freq: Four times a day (QID) | ORAL | 0 refills | Status: DC | PRN

## 2019-11-22 MED ORDER — SODIUM CHLORIDE 0.9 % IV BOLUS
1000.0000 mL | Freq: Once | INTRAVENOUS | Status: AC
  Administered 2019-11-22: 1000 mL via INTRAVENOUS

## 2019-11-22 MED ORDER — TAMSULOSIN HCL 0.4 MG PO CAPS: 0.4000 mg | ORAL_CAPSULE | Freq: Every day | ORAL | 0 refills | Status: DC

## 2019-11-22 MED ORDER — ONDANSETRON 4 MG PO TBDP: 4.0000 mg | ORAL_TABLET | Freq: Three times a day (TID) | ORAL | 0 refills | Status: DC | PRN

## 2019-11-22 MED ORDER — KETOROLAC TROMETHAMINE 15 MG/ML IJ SOLN
15.0000 mg | Freq: Once | INTRAMUSCULAR | Status: AC
  Administered 2019-11-22: 15 mg via INTRAVENOUS
  Filled 2019-11-22: qty 1

## 2019-11-22 NOTE — Discharge Instructions (Signed)
You were seen in the emergency department and suspected to have a kidney stone.  Your labs are overall reassuring, however your liver function tests were elevated, please have this rechecked by your primary care provider within 1 week.  We are sending you home with multiple medications to assist with passing the stone:   -Flomax-this is a medication to help pass the stone, it allows urine to exit the body more freely.  Please take this once daily with a meal.  -Ibuprofen 600 mg-this is a medication that will help with pain as well as passing the stone.  Please take this every 8 hours.  Take this with food as it can cause stomach upset and at worst stomach bleeding.  Do not take other NSAIDs such as Motrin, Aleve, Advil, Mobic, or Naproxen with this medicine as they are similar and would propagate any potential side effects.   -Percocet-this is a narcotic/controlled substance medication that has potential addicting qualities.  We recommend that you take 1-2 tablets every 6 hours as needed for severe pain.  Do not drive or operate heavy machinery when taking this medicine as it can be sedating. Do not drink alcohol or take other sedating medications when taking this medicine for safety reasons.  Keep this out of reach of small children.  Please be aware this medicine has Tylenol in it (325 mg/tab) do not exceed the maximum dose of Tylenol in a day per over the counter recommendations should you decide to supplement with Tylenol over the counter.   -Zofran-this is an antinausea medication, you may take this every 8 hours as needed for nausea and vomiting, please allow the tablet to dissolve underneath of your tongue.   We have prescribed you new medication(s) today. Discuss the medications prescribed today with your pharmacist as they can have adverse effects and interactions with your other medicines including over the counter and prescribed medications. Seek medical evaluation if you start to experience new  or abnormal symptoms after taking one of these medicines, seek care immediately if you start to experience difficulty breathing, feeling of your throat closing, facial swelling, or rash as these could be indications of a more serious allergic reaction  Please follow-up with the urology group provided in your discharge instructions within 3 to 5 days.  Return to the ER for new or worsening symptoms including but not limited to worsening pain not controlled by these medicines, inability to keep fluids down, fever, or any other concerns that you may have.

## 2019-11-22 NOTE — ED Notes (Signed)
Patient tolerating Oral Fluids

## 2019-11-22 NOTE — ED Triage Notes (Signed)
Pt to ED from Home with c/o of Right Flank Pain 10/10. [facility] gave patient total of 100 mcg of Fentanyl and now rating pain 2/10 and has moved down into groin area. Pt has hx of kidney stones and asthma. Today she felt similar pain to previous kidney stones upon arising at 0800 and called [facility]. Patients states she has not urinated yet this morning but has urge to go.

## 2019-11-22 NOTE — ED Provider Notes (Signed)
[facility] DEPT Provider Note   CSN: [id number] Arrival date & time: 11/22/19  [id number]     History Chief Complaint  Patient presents with  . Flank Pain    Paula Warner is a 34 y.o. female with a history of kidney stones, asthma, & prior VTE who presents to the ED via EMS with complaints of R flank pain that work her from sleep @ 0800. Patient states pain is to R flank, radiates into the R mid abdomen, waxes/wanes, initially a 10/10 in severity, feeling substantially improved with almost no pain s/p 100 mcg of fentanyl with EMS. Reports associated nausea without emesis as well as some suprapubic pressure. Urinated once this AM. Denies fever, chills, emesis, dysuria, hematuria, diarrhea, chest pain, or dyspnea. She states this feels the same as her prior kidney stones. She has not required surgical/procedural intervention to pass stones previously.   HPI     Past Medical History:  Diagnosis Date  . Asthma   . History of asthma    no current med.  Marland Kitchen 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  . History of kidney stones   . Menstrual periods irregular     Patient Active Problem List   Diagnosis Date Noted  . 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 ESRD (end stage renal disease) ([place]) 07/11/2016  . DVT (deep venous thrombosis) ([place]) 07/11/2016  . DVT of lower extremity (deep venous thrombosis) ([place]) 01/24/2016  . High risk sexual behavior 08/04/2015    Past Surgical History:  Procedure Laterality Date  . CESAREAN SECTION     x 2  . LAPAROSCOPIC TUBAL LIGATION Bilateral 05/08/2017   Procedure: LAPAROSCOPIC TUBAL LIGATION - FILSHIE CLIPS;  Surgeon: Emily Filbert, MD;  Location: [facility];  Service: Gynecology;  Laterality: Bilateral;  . LOWER EXTREMITY ANGIOGRAM Left 07/11/2016   Procedure: Percutaneous Venous Thrombectomy with IVUS and  with Stent Placement;  Surgeon: Serafina Mitchell, MD;   Location: [facility] OR;  Service: Vascular;  Laterality: Left;     OB History    Gravida  3   Para  1   Term      Preterm      AB  1   Living  2     SAB      TAB  1   Ectopic      Multiple  0   Live Births  2           Family History  Problem Relation Age of Onset  . Heart murmur Mother   . Asthma Mother   . Clotting disorder Father   . Heart disease Father     Social History   Tobacco Use  . Smoking status: Never Smoker  . Smokeless tobacco: Never Used  Substance Use Topics  . Alcohol use: No    Alcohol/week: 0.0 standard drinks  . Drug use: No    Home Medications Prior to Admission medications   Medication Sig Start Date End Date Taking? Authorizing Provider  cyclobenzaprine (FLEXERIL) 10 MG tablet Take 1 tablet (10 mg total) by mouth 2 (two) times daily as needed for muscle spasms. Patient not taking: Reported on 08/27/2018 10/31/17   McDonald, Maree Erie A, PA-C  ketorolac (TORADOL) 10 MG tablet Take 1 tablet (10 mg total) by mouth every 6 (six) hours as needed. 04/09/19   Jacqlyn Larsen, PA-C  medroxyPROGESTERone (PROVERA) 10 MG tablet Take 1 tablet (10 mg total) by  mouth daily. Use for ten days Patient not taking: Reported on 04/06/2019 10/28/18   Leftwich-Kirby, Kathie Dike, CNM  metroNIDAZOLE (FLAGYL) 500 MG tablet Take 1 tablet (500 mg total) by mouth 2 (two) times daily with a meal. DO NOT CONSUME ALCOHOL WHILE TAKING THIS MEDICATION. Patient not taking: Reported on 10/28/2018 08/27/18   Ocie Cornfield T, PA-C  ondansetron (ZOFRAN) 4 MG tablet Take 1 tablet (4 mg total) by mouth every 8 (eight) hours as needed for nausea or vomiting. 04/06/19   Tegeler, Gwenyth Allegra, MD  oxyCODONE-acetaminophen (PERCOCET/ROXICET) 5-325 MG tablet Take 1 tablet by mouth every 4 (four) hours as needed for severe pain. 04/06/19   Tegeler, Gwenyth Allegra, MD  tamsulosin (FLOMAX) 0.4 MG CAPS capsule Take 1 capsule (0.4 mg total) by mouth daily. 04/06/19   Tegeler, Gwenyth Allegra, MD     Allergies    Zinacef [cefuroxime]  Review of Systems   Review of Systems  Constitutional: Negative for chills and fever.  Respiratory: Negative for cough and shortness of breath.   Cardiovascular: Negative for chest pain.  Gastrointestinal: Positive for nausea. Negative for blood in stool, constipation, diarrhea and vomiting.  Genitourinary: Positive for flank pain. Negative for dysuria, hematuria, vaginal bleeding and vaginal discharge.  All other systems reviewed and are negative.   Physical Exam Updated Vital Signs BP 111/75 (BP Location: Right Arm)   Pulse 65   Temp 98.8 F (37.1 C) (Oral)   Resp 15   SpO2 92%   Physical Exam Vitals and nursing note reviewed.  Constitutional:      General: She is not in acute distress.    Appearance: She is well-developed. She is not toxic-appearing.  HENT:     Head: Normocephalic and atraumatic.  Eyes:     General:        Right eye: No discharge.        Left eye: No discharge.     Conjunctiva/sclera: Conjunctivae normal.  Cardiovascular:     Rate and Rhythm: Normal rate and regular rhythm.  Pulmonary:     Effort: Pulmonary effort is normal. No respiratory distress.     Breath sounds: Normal breath sounds. No wheezing, rhonchi or rales.  Abdominal:     General: There is no distension.     Palpations: Abdomen is soft.     Tenderness: There is no abdominal tenderness. There is right CVA tenderness. There is no left CVA tenderness, guarding or rebound.  Musculoskeletal:     Cervical back: Neck supple.  Skin:    General: Skin is warm and dry.     Findings: No rash.  Neurological:     Mental Status: She is alert.     Comments: Clear speech.   Psychiatric:        Behavior: Behavior normal.     ED Results / Procedures / Treatments   Labs (all labs ordered are listed, but only abnormal results are displayed) Labs Reviewed  URINE CULTURE  CBC WITH DIFFERENTIAL/PLATELET  COMPREHENSIVE METABOLIC PANEL  URINALYSIS, ROUTINE  W REFLEX MICROSCOPIC  I-STAT BETA HCG BLOOD, ED ([facility], [facility], [facility] ONLY)    EKG None  Radiology No results found.  Procedures Procedures (including critical care time)  Medications Ordered in ED Medications  sodium chloride 0.9 % bolus 1,000 mL (has no administration in time range)    ED Course  I have reviewed the triage vital signs and the nursing notes.  Pertinent labs & imaging results that were available during my care of the  patient were reviewed by me and considered in my medical decision making (see chart for details).    MDM Rules/Calculators/A&P                      Patient presents to the emergency department with complaints of right flank pain that began at 0800 this morning.  Nontoxic, resting comfortably, vitals WNL.  Pain significantly improved and almost resolved upon ED arrival.  Mild right CVA tenderness.  No abdominal tenderness or peritoneal signs. Chart reviewed for additional history. Patient has had multiple prior CT renal stone studies, most recently 04/06/19 which revealed 4 mm proximal left ureteral stone with mild hydro-, she also had multiple punctate nonobstructing stones within the kidneys bilaterally.  Plan for labs for further assessment.  CBC: No anemia or leukocytosis CMP: Mildly elevated LFTs and T bili somewhat similar to prior, no right upper quadrant abdominal tenderness, will need PCP follow-up for this.  Renal function is preserved.  No significant electrolyte derangement. Pregnancy test: Negative Urinalysis: Hematuria without obvious infection.  Based on patient history & physical exam with hematuria suspect that kidney stone is the most likely diagnosis, discussed option of imaging for further assessment, patient is okay foregoing this and treating as presumed kidney stone at this time.  Repeat abdominal exam remains nontender without peritoneal signs, do not suspect cholecystitis, appendicitis, perforation, obstruction, PID, or ectopic pregnancy.   Patient feeling much better pain well controlled and tolerating p.o.  Appears appropriate for discharge home with symptomatic management and urology follow-up. I discussed results, treatment plan, need for follow-up, and return precautions with the patient. Provided opportunity for questions, patient confirmed understanding and is in agreement with plan.    Final Clinical Impression(s) / ED Diagnoses Final diagnoses:  Renal colic  Elevated LFTs    Rx / DC Orders ED Discharge Orders         Ordered    HYDROcodone-acetaminophen (NORCO/VICODIN) 5-325 MG tablet  Every 6 hours PRN     11/22/19 1324    ondansetron (ZOFRAN ODT) 4 MG disintegrating tablet  Every 8 hours PRN     11/22/19 1324    ibuprofen (ADVIL) 600 MG tablet  Every 8 hours PRN     11/22/19 1324    tamsulosin (FLOMAX) 0.4 MG CAPS capsule  Daily after supper     11/22/19 [address], [place] R, PA-C 11/22/19 1326    Lajean Saver, MD 11/23/19 [phone]

## 2019-11-24 LAB — URINE CULTURE: Culture: <10000 — AB

## 2020-01-09 ENCOUNTER — Emergency Department (HOSPITAL_COMMUNITY): Payer: Self-pay

## 2020-01-09 ENCOUNTER — Emergency Department (HOSPITAL_COMMUNITY)
Admission: EM | Admit: 2020-01-09 | Discharge: 2020-01-09 | Disposition: A | Discharge status: Home / Self Care | Payer: Self-pay | Attending: Emergency Medicine | Admitting: Emergency Medicine

## 2020-01-09 ENCOUNTER — Other Ambulatory Visit: Payer: Self-pay

## 2020-01-09 DIAGNOSIS — Z79899 Other long term (current) drug therapy: Secondary | ICD-10-CM | POA: Insufficient documentation

## 2020-01-09 DIAGNOSIS — N186 End stage renal disease: Secondary | ICD-10-CM | POA: Insufficient documentation

## 2020-01-09 DIAGNOSIS — R05 Cough: Secondary | ICD-10-CM | POA: Insufficient documentation

## 2020-01-09 DIAGNOSIS — K8020 Calculus of gallbladder without cholecystitis without obstruction: Secondary | ICD-10-CM | POA: Insufficient documentation

## 2020-01-09 DIAGNOSIS — J45909 Unspecified asthma, uncomplicated: Secondary | ICD-10-CM | POA: Insufficient documentation

## 2020-01-09 DIAGNOSIS — R7989 Other specified abnormal findings of blood chemistry: Secondary | ICD-10-CM | POA: Insufficient documentation

## 2020-01-09 DIAGNOSIS — Q2572 Congenital pulmonary arteriovenous malformation: Secondary | ICD-10-CM | POA: Insufficient documentation

## 2020-01-09 DIAGNOSIS — M549 Dorsalgia, unspecified: Secondary | ICD-10-CM

## 2020-01-09 LAB — COMPREHENSIVE METABOLIC PANEL
ALT: 520 U/L — ABNORMAL HIGH (ref 0–44)
AST: 579 U/L — ABNORMAL HIGH (ref 15–41)
Albumin: 4.2 g/dL (ref 3.5–5.0)
Alkaline Phosphatase: 124 U/L (ref 38–126)
Anion gap: 8 (ref 5–15)
BUN: 15 mg/dL (ref 6–20)
CO2: 25 mmol/L (ref 22–32)
Calcium: 9.3 mg/dL (ref 8.9–10.3)
Chloride: 108 mmol/L (ref 98–111)
Creatinine, Ser: 0.51 mg/dL (ref 0.44–1.00)
GFR calc Af Amer: >60 mL/min (ref >60)
GFR calc non Af Amer: >60 mL/min (ref >60)
Glucose, Bld: 114 mg/dL — ABNORMAL HIGH (ref 70–99)
Potassium: 3.7 mmol/L (ref 3.5–5.1)
Sodium: 141 mmol/L (ref 135–145)
Total Bilirubin: 3.3 mg/dL — ABNORMAL HIGH (ref 0.3–1.2)
Total Protein: 8 g/dL (ref 6.5–8.1)

## 2020-01-09 LAB — CBC WITH DIFFERENTIAL/PLATELET
Abs Immature Granulocytes: 0.02 10*3/uL (ref 0.00–0.07)
Basophils Absolute: 0 10*3/uL (ref 0.0–0.1)
Basophils Relative: 0 %
Eosinophils Absolute: 0.1 10*3/uL (ref 0.0–0.5)
Eosinophils Relative: 1 %
HCT: 44.2 % (ref 36.0–46.0)
Hemoglobin: 14.7 g/dL (ref 12.0–15.0)
Immature Granulocytes: 0 %
Lymphocytes Relative: 39 %
Lymphs Abs: 3 10*3/uL (ref 0.7–4.0)
MCH: 31.7 pg (ref 26.0–34.0)
MCHC: 33.3 g/dL (ref 30.0–36.0)
MCV: 95.5 fL (ref 80.0–100.0)
Monocytes Absolute: 0.7 10*3/uL (ref 0.1–1.0)
Monocytes Relative: 9 %
Neutro Abs: 3.9 10*3/uL (ref 1.7–7.7)
Neutrophils Relative %: 51 %
Platelets: 218 10*3/uL (ref 150–400)
RBC: 4.63 MIL/uL (ref 3.87–5.11)
RDW: 12.6 % (ref 11.5–15.5)
WBC: 7.7 10*3/uL (ref 4.0–10.5)
nRBC: 0 % (ref 0.0–0.2)

## 2020-01-09 LAB — PREGNANCY, URINE: Preg Test, Ur: NEGATIVE

## 2020-01-09 LAB — D-DIMER, QUANTITATIVE: D-Dimer, Quant: 1.55 ug/mL-FEU — ABNORMAL HIGH (ref 0.00–0.50)

## 2020-01-09 IMAGING — CR DG CHEST 2V
2 series · 2 of 2 positions shown · non-contrast
Comparison: None.

CLINICAL DATA: Mid back pain radiating to the chest and abdomen for
the past 2 days.

EXAM:
CHEST - 2 VIEW

[w chest pa]
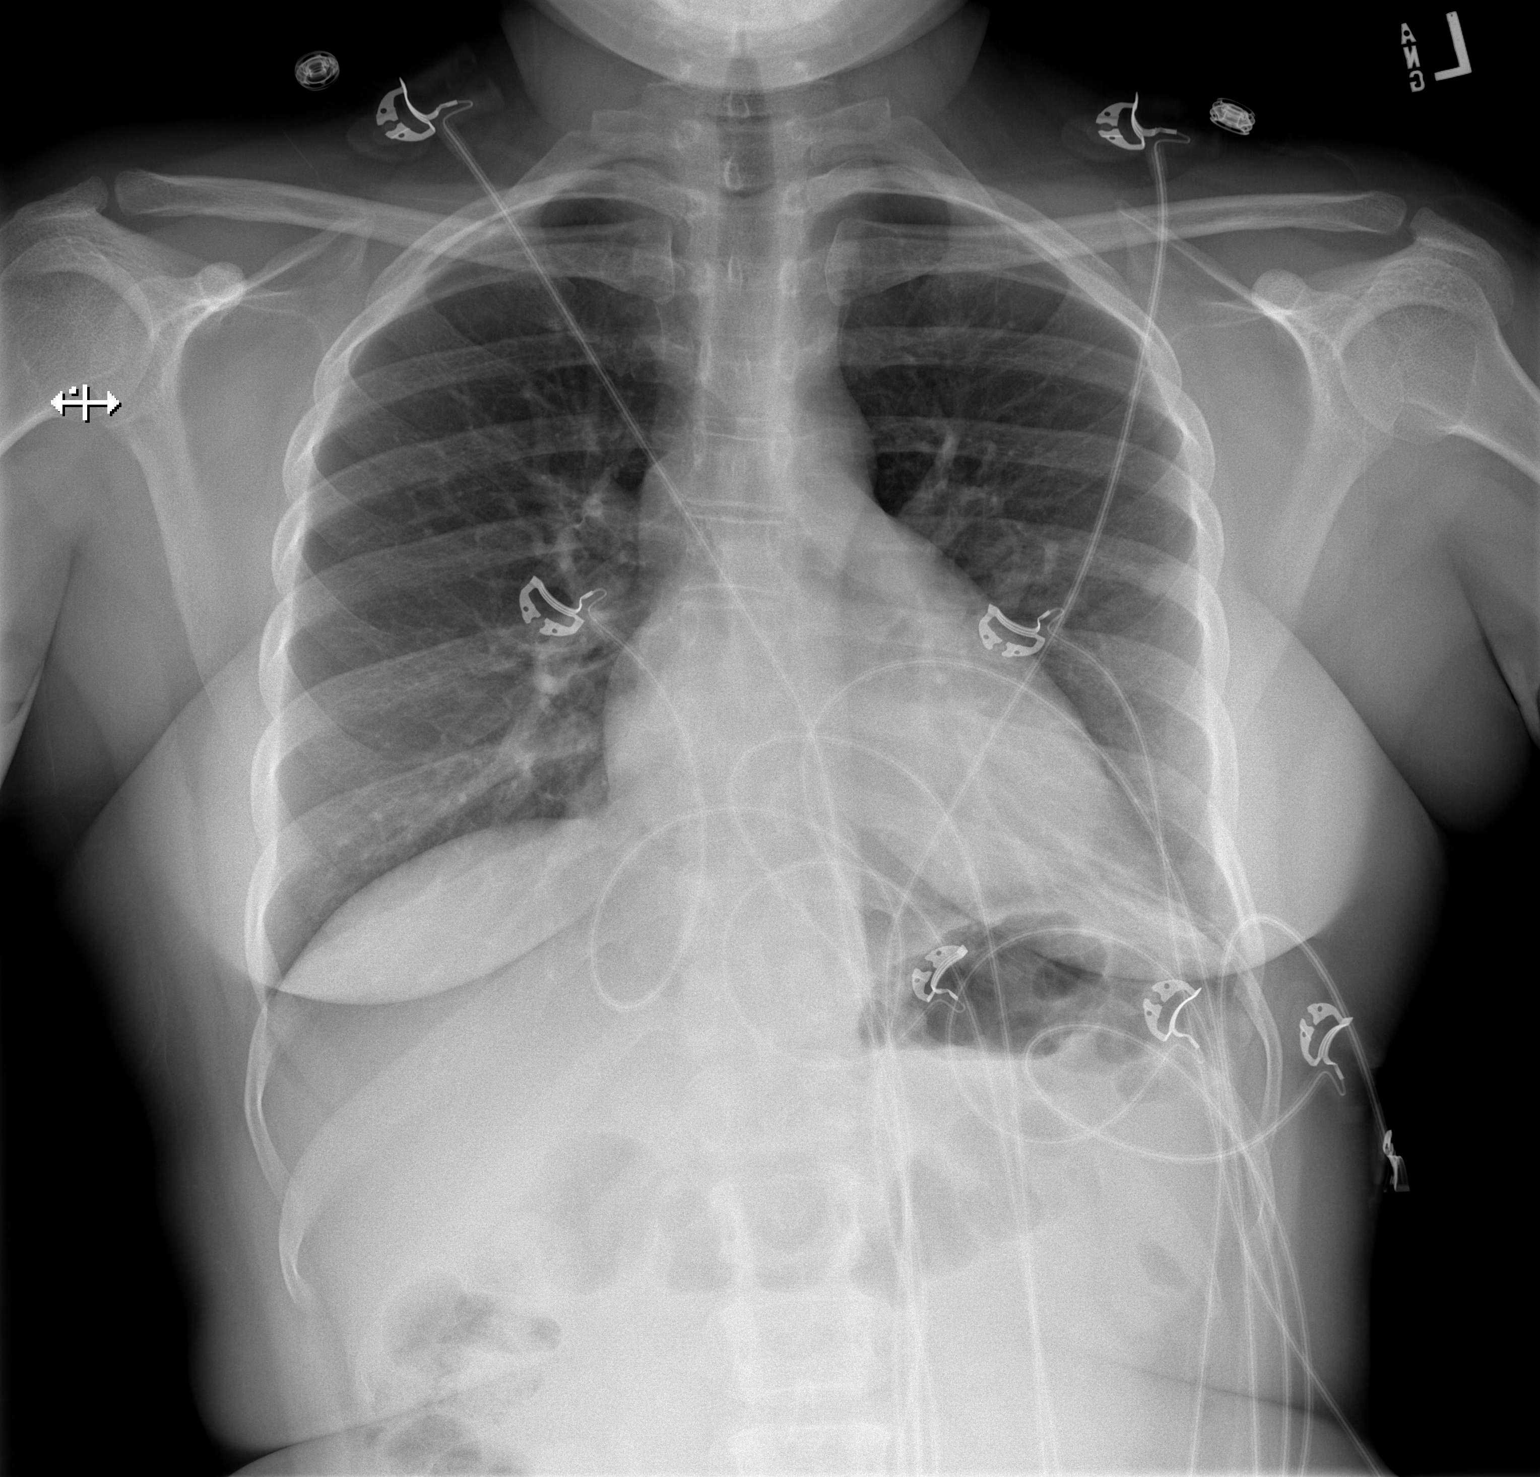

[w chest lat]
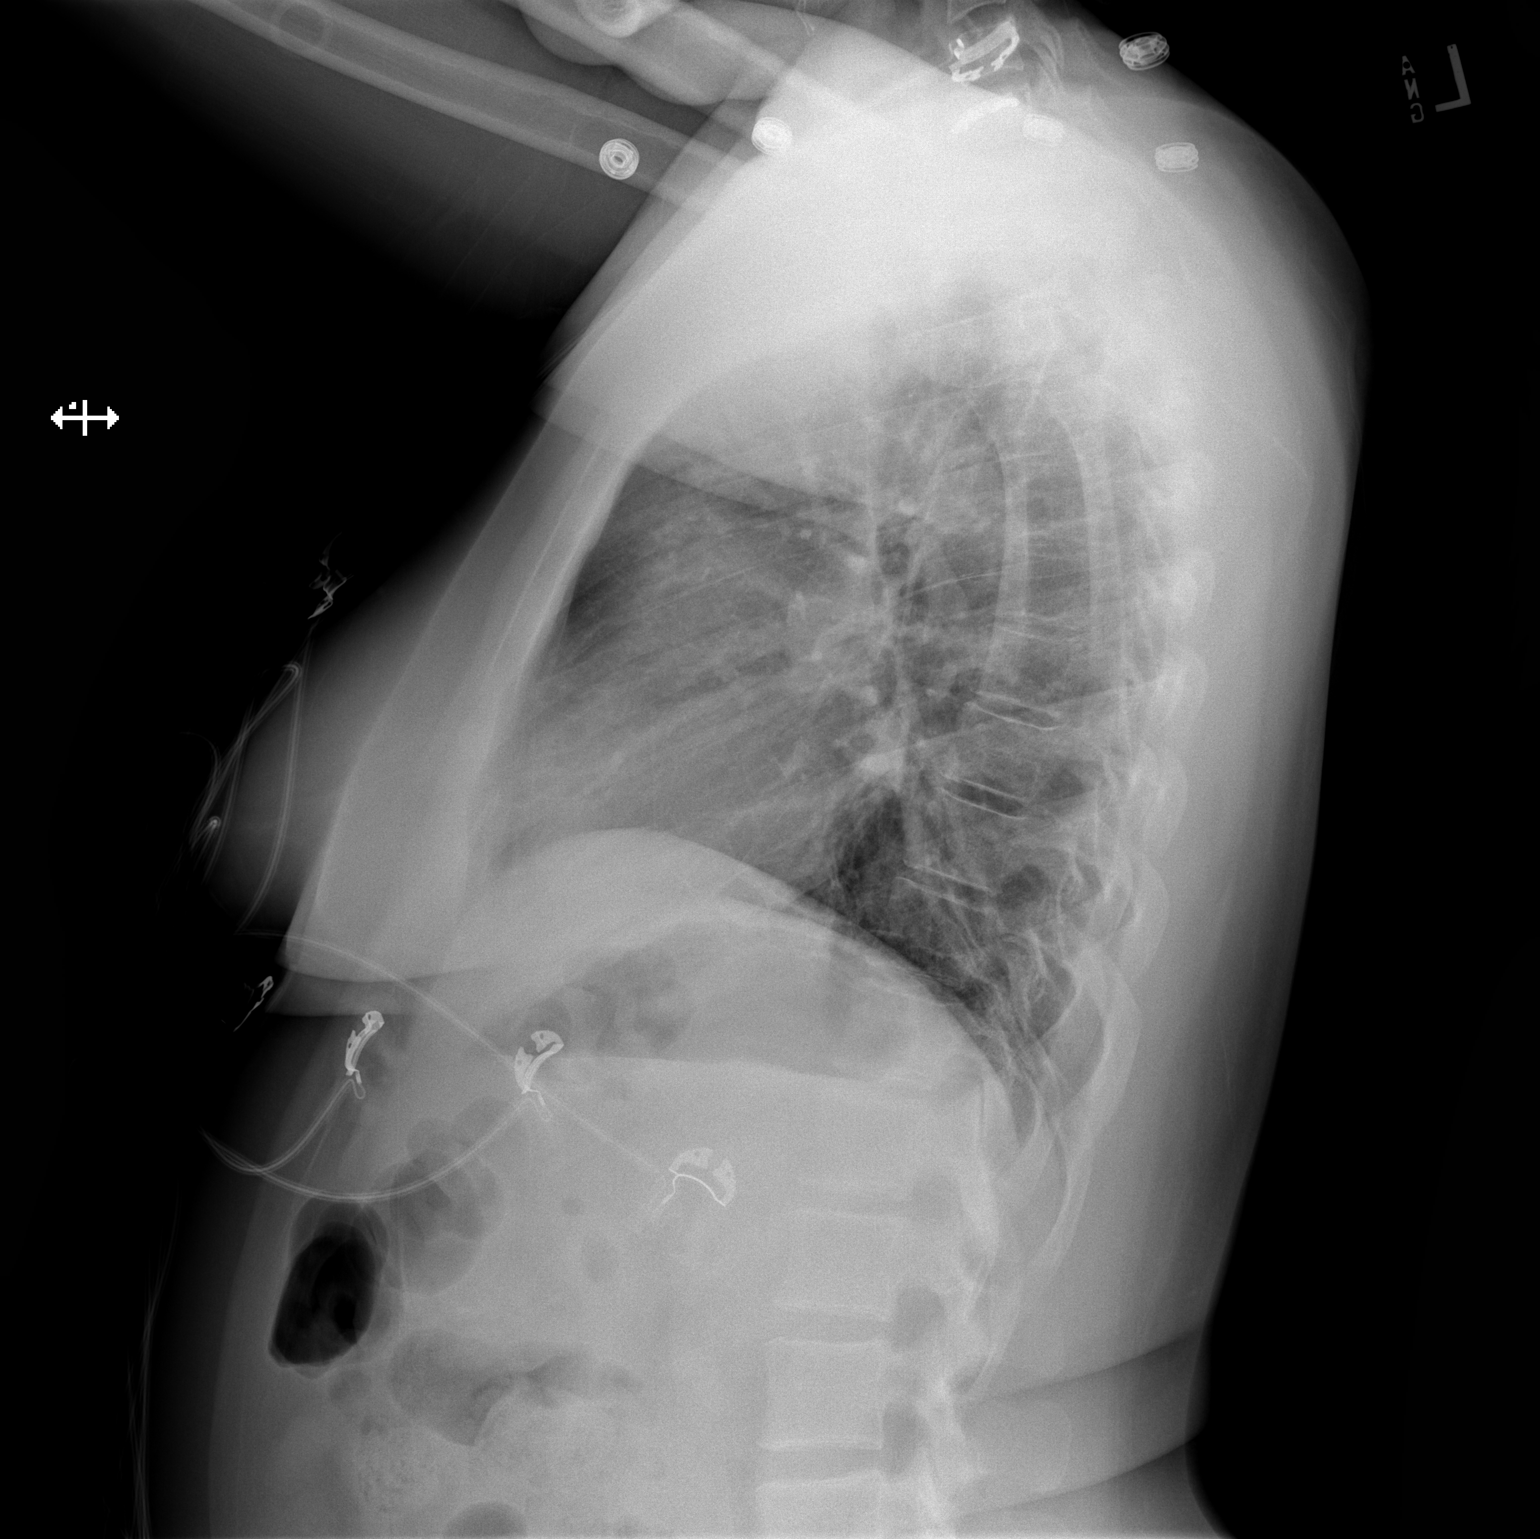

[2 of 2 positions shown; findings below may reference images not displayed]

FINDINGS: Normal sized heart. Mild peribronchial thickening. Poor inspiration.
Mild linear density at the left lung base. Mild scoliosis.
IMPRESSION: 1. Mild bronchitic changes.
2. Poor inspiration with mild left basilar atelectasis.

## 2020-01-09 IMAGING — CT CT ANGIO CHEST
2 of 6 series · 18 of 36 positions shown · IV contrast (OMNIPAQUE 350)
Comparison: Chest radiograph from earlier today.

CLINICAL DATA: Mid back pain radiating to the chest for 2 days with
pleuritic symptoms. Elevated D-dimer.

EXAM:
CT ANGIOGRAPHY CHEST WITH CONTRAST
TECHNIQUE: Multidetector CT imaging of the chest was performed using the
standard protocol during bolus administration of intravenous
contrast. Multiplanar CT image reconstructions and MIPs were
obtained to evaluate the vascular anatomy.
CONTRAST:  100mL OMNIPAQUE IOHEXOL 350 MG/ML SOLN

[Series 5: thins · axial · 0.56mm/px · z∈[-272,-68]mm · 17 of 230 slices shown]
[im 13/230  lung]
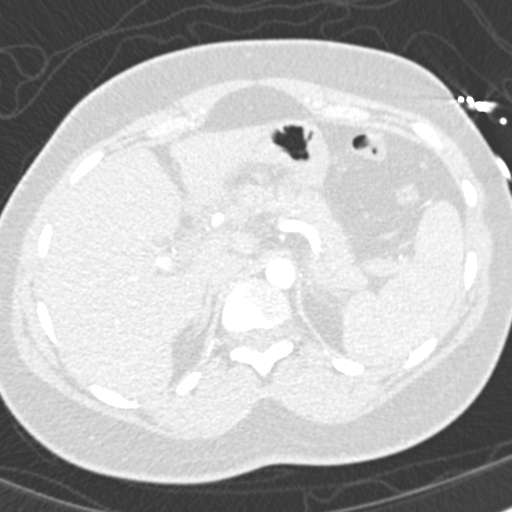
[im 26/230  mediastinal]
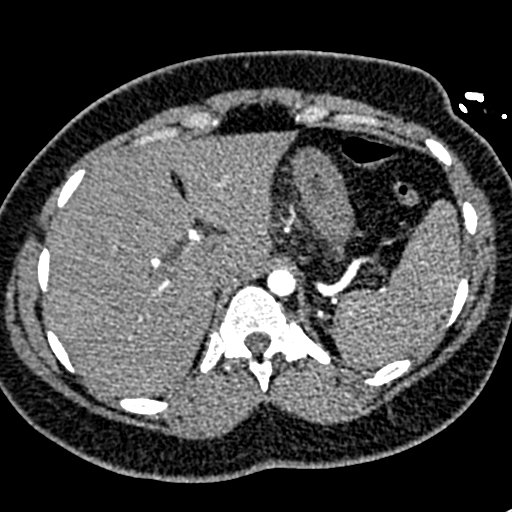
[im 39/230  lung]
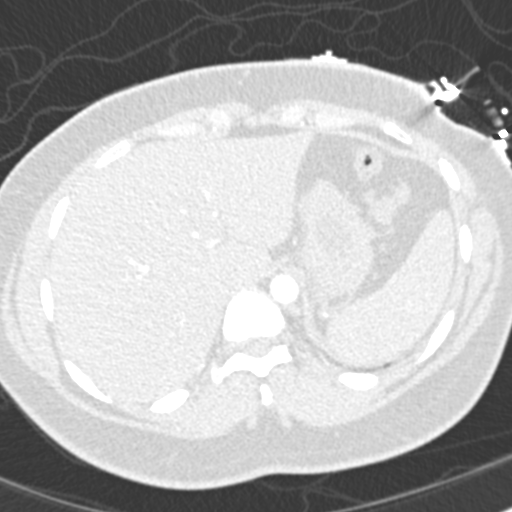
[im 51/230  mediastinal]
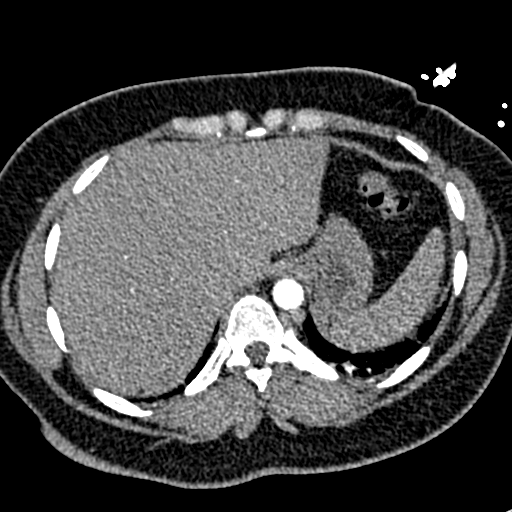
[im 64/230  lung]
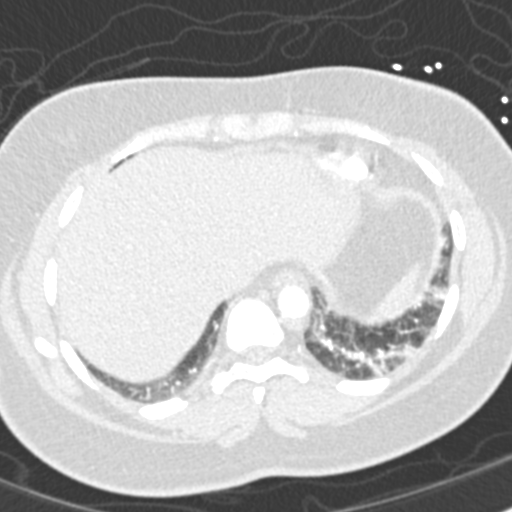
[im 77/230  mediastinal]
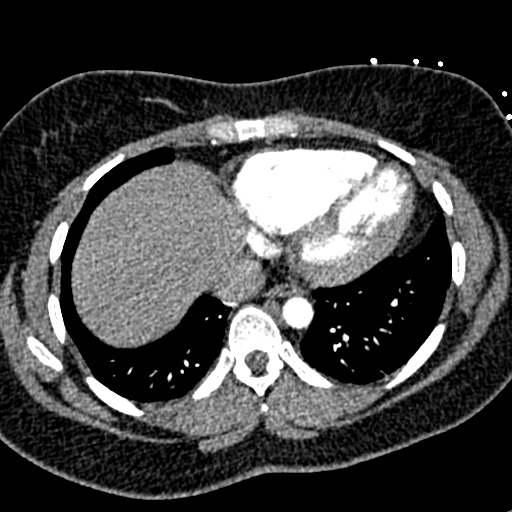
[im 90/230  lung]
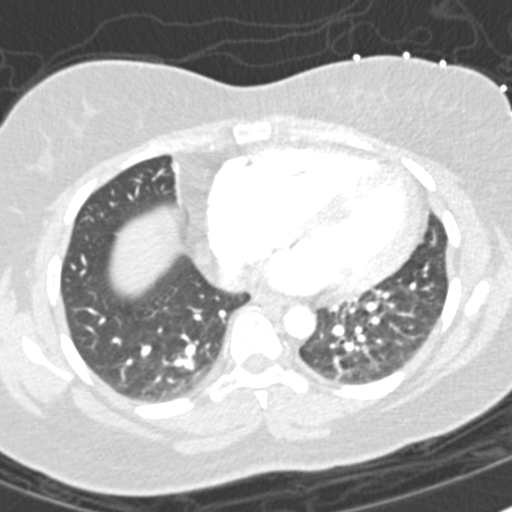
[im 102/230  mediastinal]
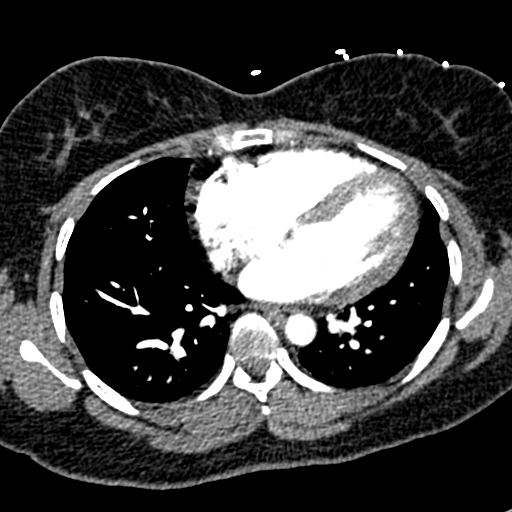
[im 115/230  lung]
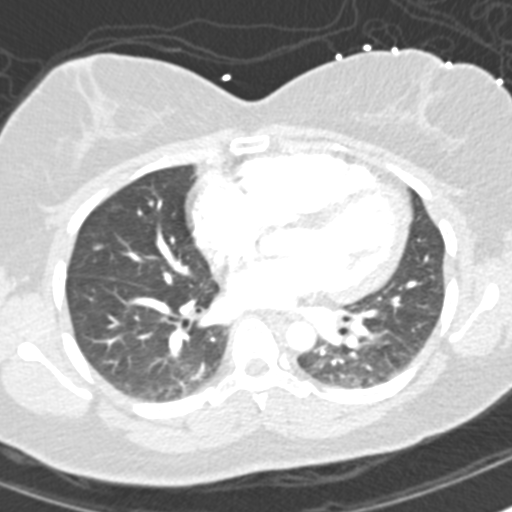
[im 128/230  mediastinal]
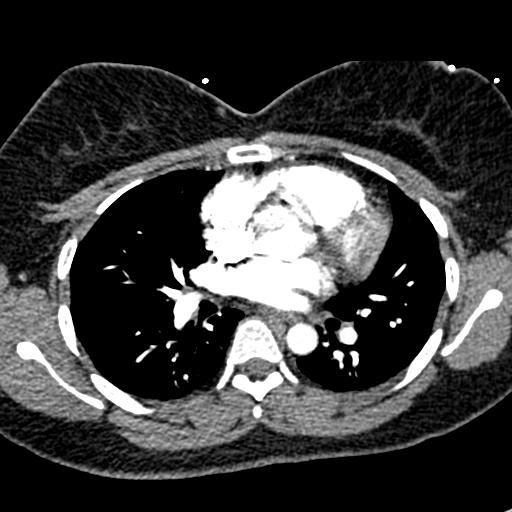
[im 140/230  lung]
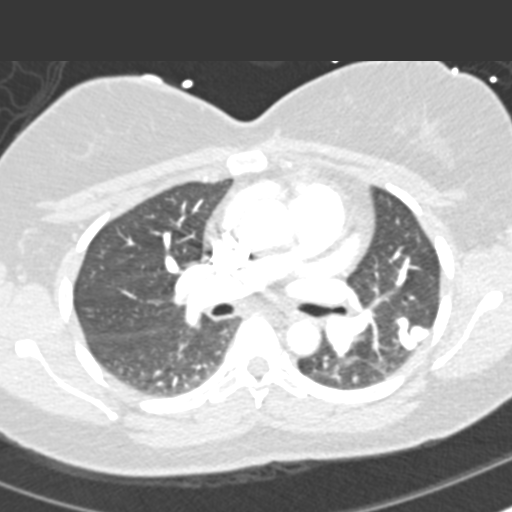
[im 153/230  mediastinal]
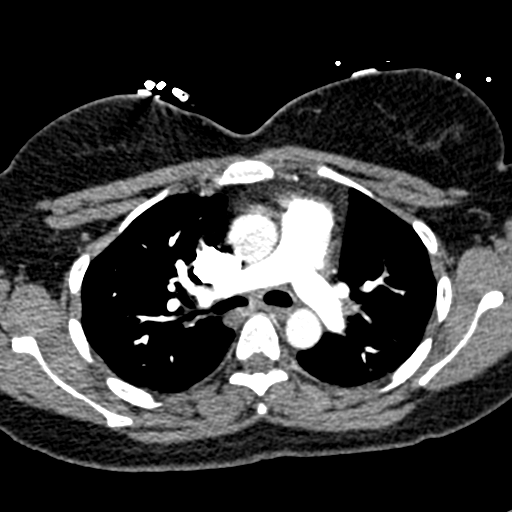
[im 166/230  lung]
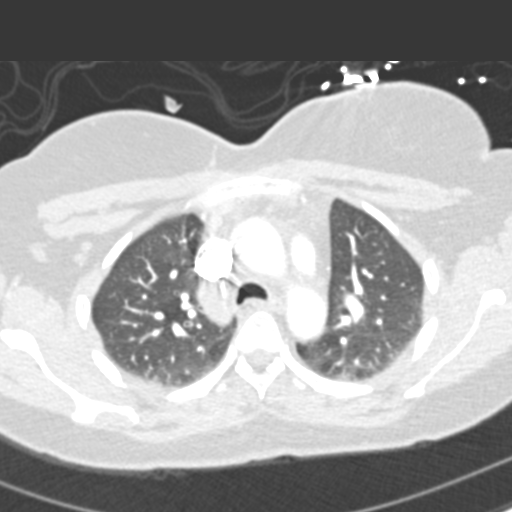
[im 179/230  mediastinal]
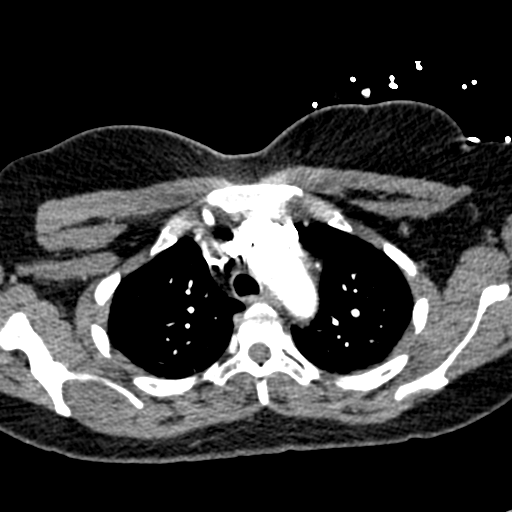
[im 191/230  lung]
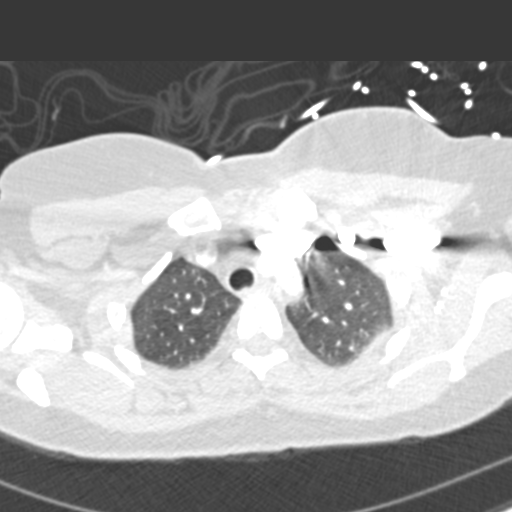
[im 204/230  mediastinal]
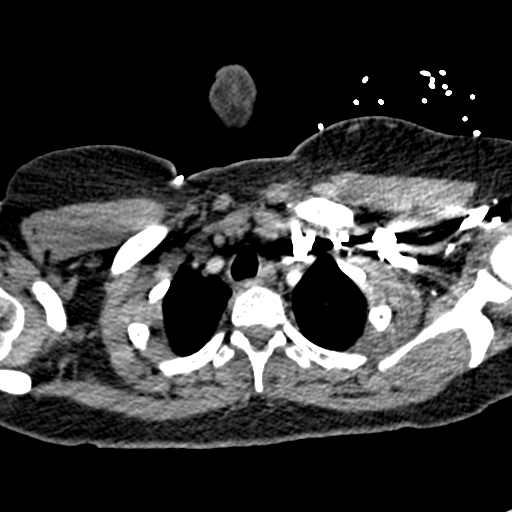
[im 217/230  lung]
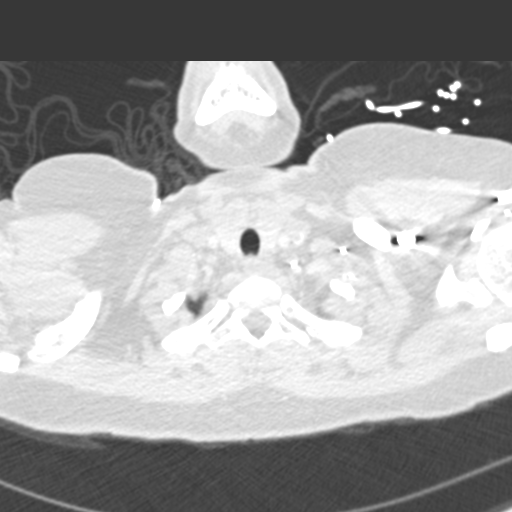

[Series 7: coronal mpr · coronal · 0.46mm/px · 1 of 110 slices shown]
[im 55/110  mediastinal]
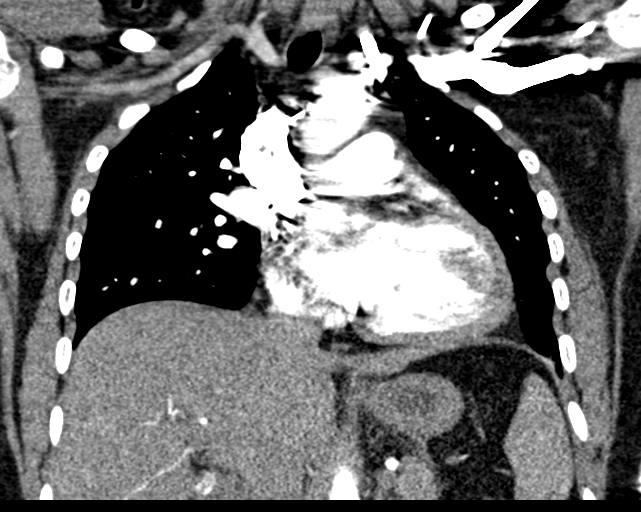

[18 of 36 positions shown; findings below may reference images not displayed]

FINDINGS: Cardiovascular: The study is high quality for the evaluation of
pulmonary embolism. There are no filling defects in the central,
lobar, segmental or subsegmental pulmonary artery branches to
suggest acute pulmonary embolism. Great vessels are normal in course
and caliber. Top-normal heart size. No significant pericardial
fluid/thickening.

Mediastinum/Nodes: No discrete thyroid nodules. Unremarkable
esophagus. No pathologically enlarged axillary, mediastinal or hilar
lymph nodes.

Lungs/Pleura: No pneumothorax. No pleural effusion. No acute
consolidative airspace disease, lung masses or significant pulmonary
nodules. There is a 1.9 x 1.5 cm pulmonary arteriovenous
malformation in the peripheral left upper lobe (series 6/image 46).

Upper abdomen: Cholelithiasis.

Musculoskeletal:  No aggressive appearing focal osseous lesions.

Review of the MIP images confirms the above findings.
IMPRESSION: 1. No pulmonary embolism.
2. Pulmonary arteriovenous malformation measuring 1.9 x 1.5 cm in
the peripheral left upper lobe.
3. Cholelithiasis.

## 2020-01-09 IMAGING — US US ABDOMEN LIMITED
1 series · 14 of 25 positions shown · non-contrast
Comparison: [DATE].

CLINICAL DATA: Elevated liver function tests.

EXAM:
ULTRASOUND ABDOMEN LIMITED RIGHT UPPER QUADRANT

[Series 1: us abdomen limited · 14 of 52 slices shown]
[im 1/52]
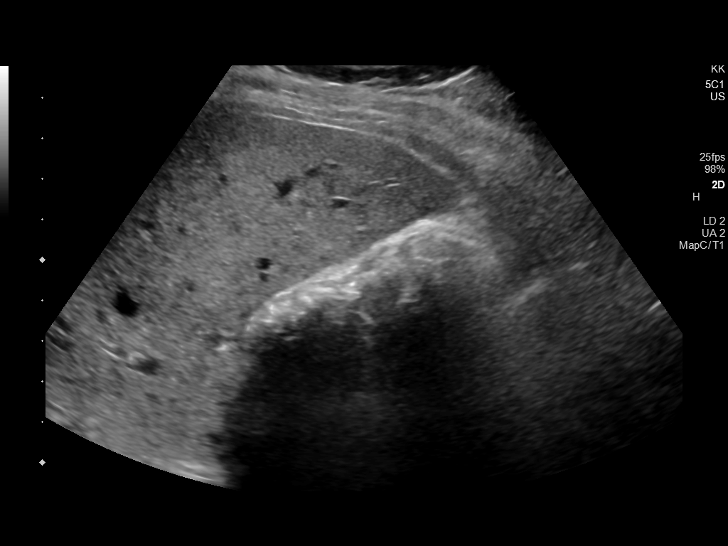
[im 5/52]
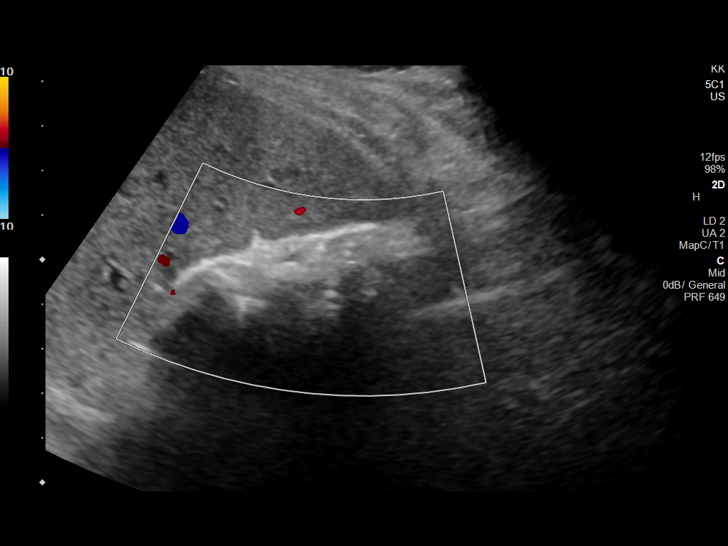
[im 9/52]
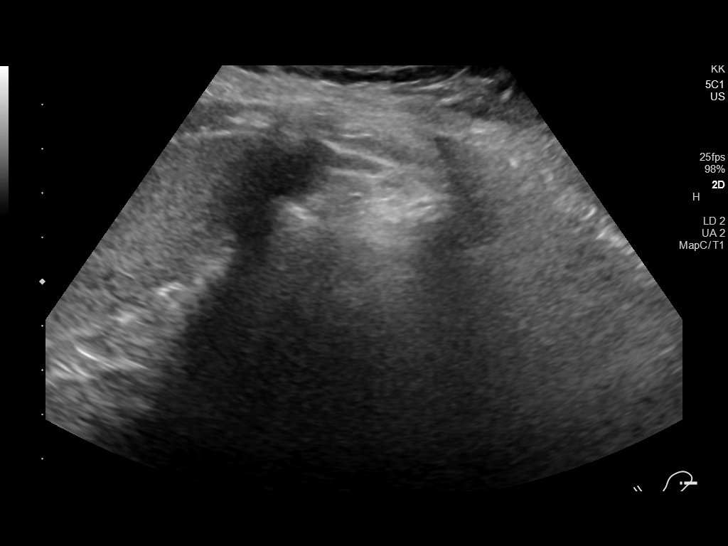
[im 13/52]
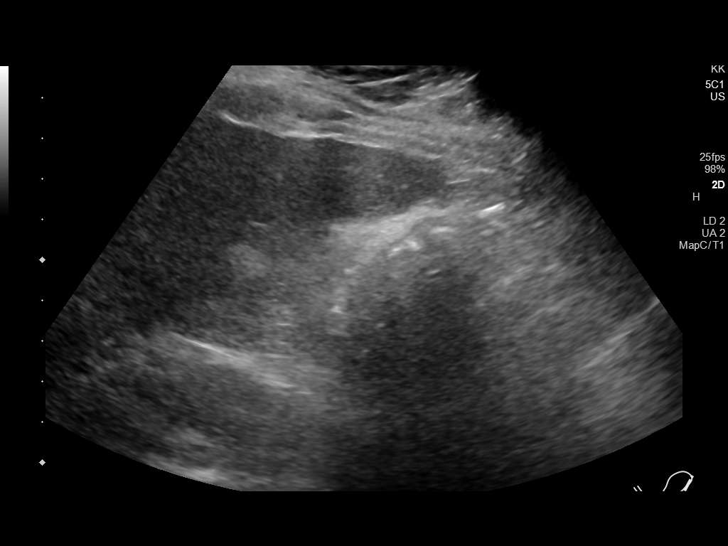
[im 18/52]
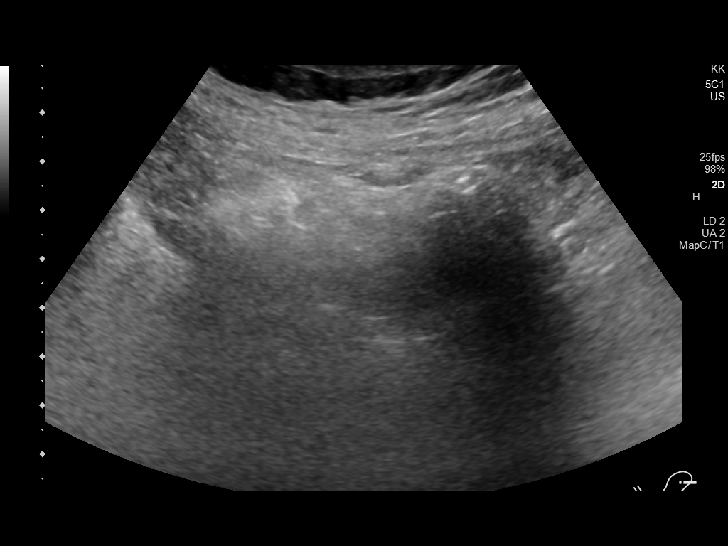
[im 20/52]
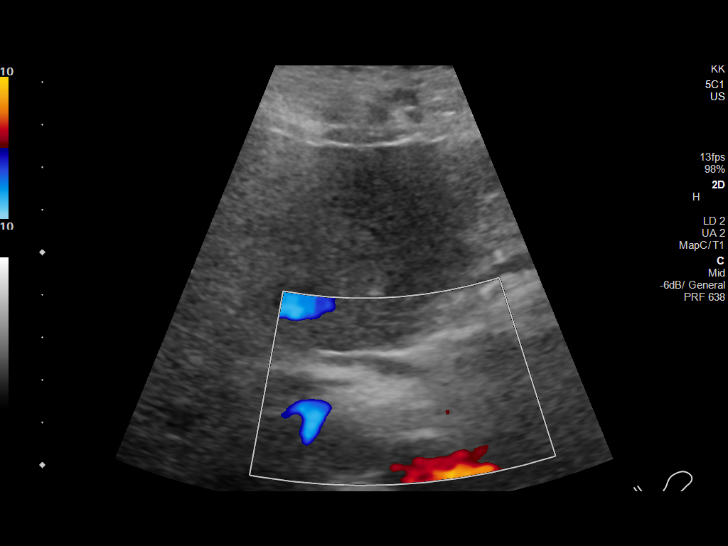
[im 24/52]
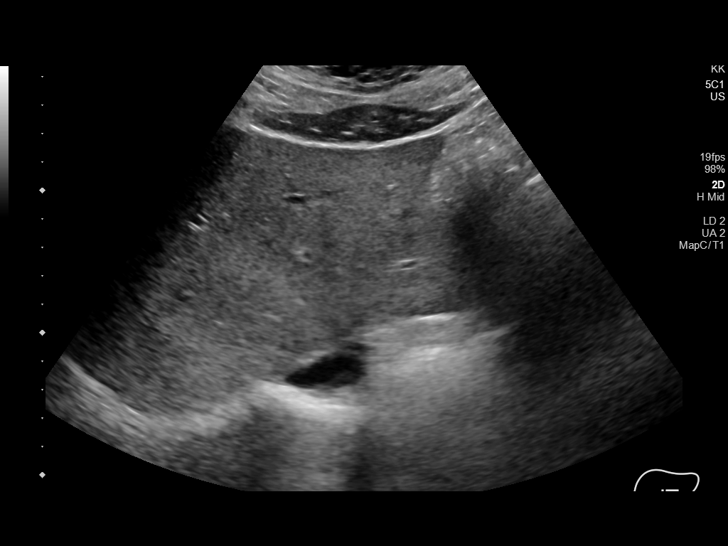
[im 28/52]
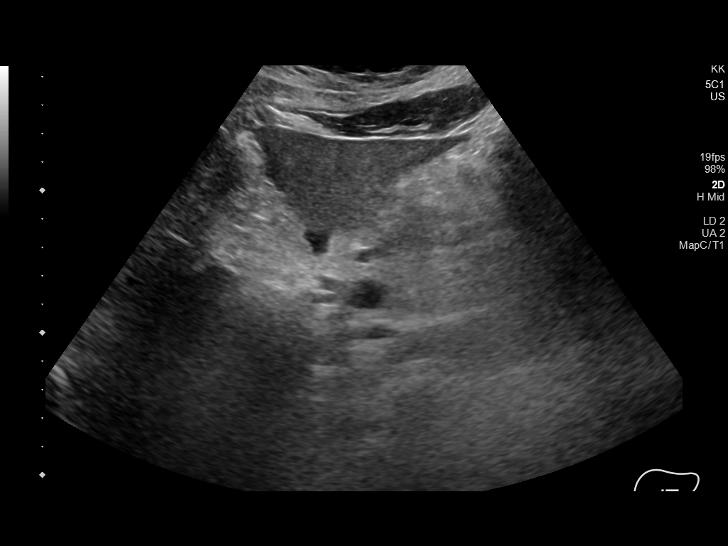
[im 32/52]
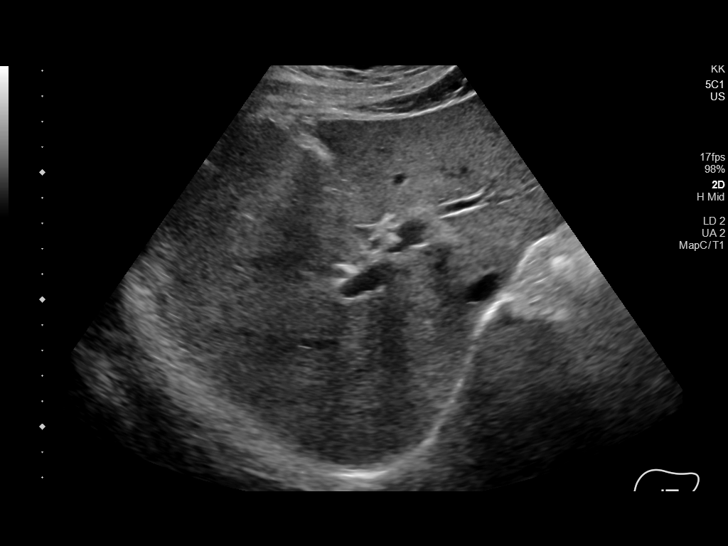
[im 35/52]
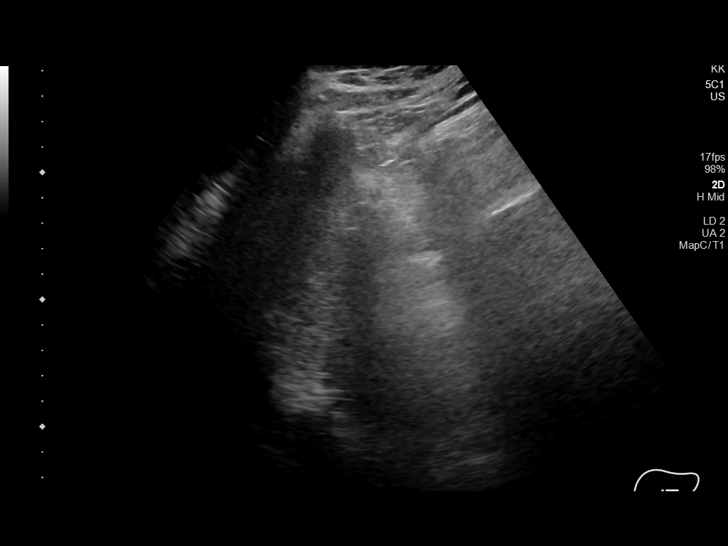
[im 39/52]
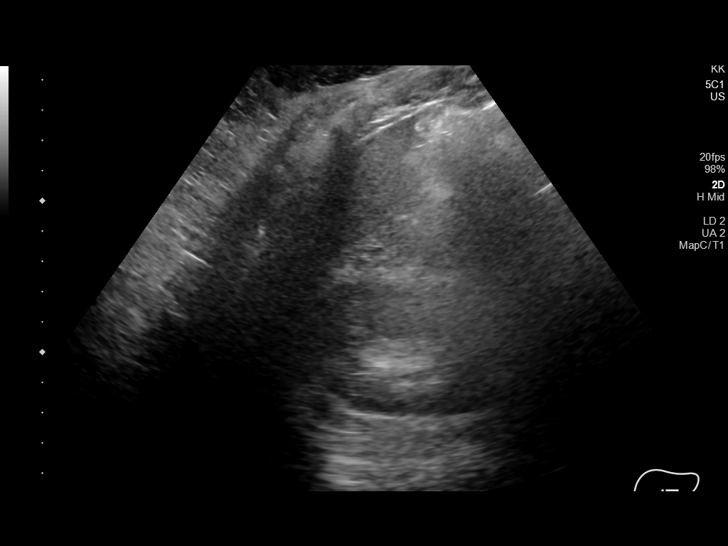
[im 43/52]
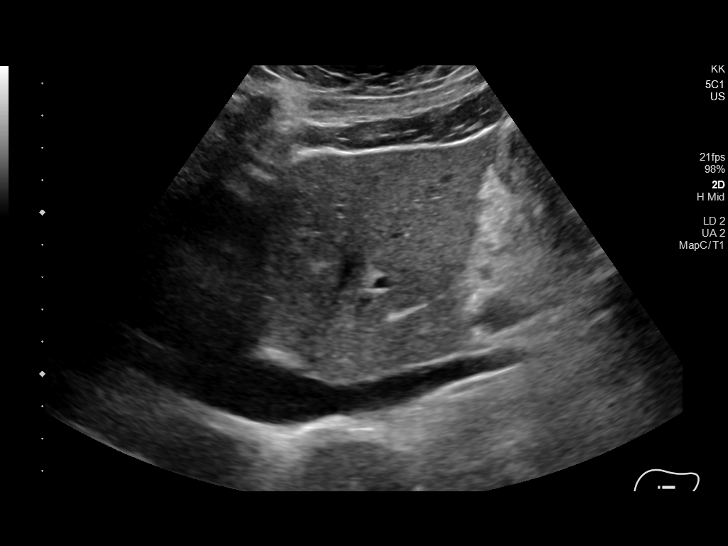
[im 47/52]
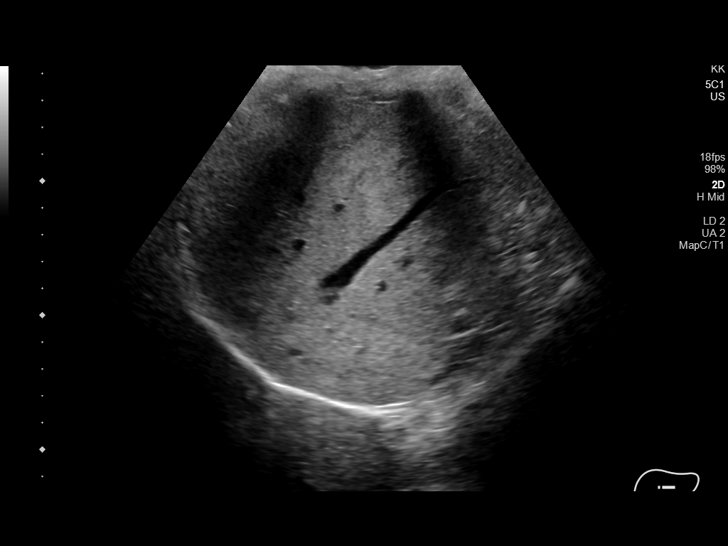
[im 52/52]
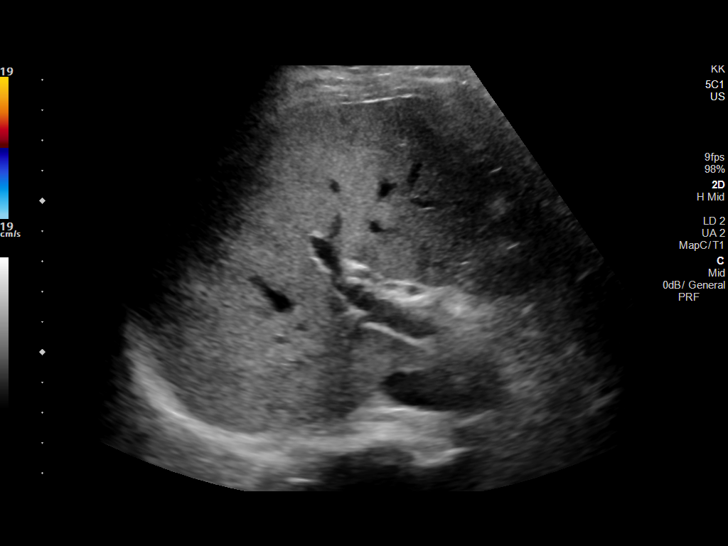

[14 of 25 positions shown; findings below may reference images not displayed]

FINDINGS: Gallbladder:

The gallbladder lumen is full of gallstones without significant
gallbladder wall thickening or pericholecystic fluid. Sonographic
Murphy's sign could not be assessed as the patient had received
morphine.

Common bile duct:

Diameter: 4 mm which is within normal limits.

Liver:

No focal lesion identified. Within normal limits in parenchymal
echogenicity. Portal vein is patent on color Doppler imaging with
normal direction of blood flow towards the liver.

Other: None.
IMPRESSION: Cholelithiasis is noted.  No definite evidence of cholecystitis.

## 2020-01-09 MED ORDER — MORPHINE SULFATE (PF) 4 MG/ML IV SOLN
4.0000 mg | Freq: Once | INTRAVENOUS | Status: AC
  Administered 2020-01-09: 4 mg via INTRAVENOUS
  Filled 2020-01-09: qty 1

## 2020-01-09 MED ORDER — IOHEXOL 350 MG/ML SOLN
100.0000 mL | Freq: Once | INTRAVENOUS | Status: AC | PRN
  Administered 2020-01-09: 100 mL via INTRAVENOUS

## 2020-01-09 MED ORDER — SODIUM CHLORIDE (PF) 0.9 % IJ SOLN
INTRAMUSCULAR | Status: AC
  Filled 2020-01-09: qty 50

## 2020-01-09 MED ORDER — METHOCARBAMOL 500 MG PO TABS: 500.0000 mg | ORAL_TABLET | Freq: Every evening | ORAL | 0 refills | Status: DC | PRN

## 2020-01-09 MED ORDER — METHOCARBAMOL 500 MG PO TABS
500.0000 mg | ORAL_TABLET | Freq: Once | ORAL | Status: AC
  Administered 2020-01-09: 500 mg via ORAL
  Filled 2020-01-09: qty 1

## 2020-01-09 NOTE — Discharge Instructions (Signed)
Your upper back pain is likely due to muscle pain.  Treat this with Tylenol and ibuprofen. Use muscle relaxers as needed for muscle pain or stiffness.  Have caution, this may make you tired or groggy.  Do not drive or operate heavy machinery while take this medicine.  Today you were found to have gallstones in your gallbladder.  This is affecting your liver.  It is extremely important that you get this rechecked early next week.  There are several options for you can get this rechecked.  You may try to establish with a primary care doctor, you may follow-up with the GI office listed below, you may call the urgent care to see if they can redraw labs, or you may return to the ER for recheck.  You were given information to follow-up with a general surgeon as needed for further evaluation and management of your gallbladder.  Your lung imaging showed an abnormality of the vasculature.  Follow-up with your primary care doctor for further evaluation of this.  Return to the emergency room if you develop fevers, severe worsening pain, persistent vomiting, any new, worsening, concerning symptoms.

## 2020-01-09 NOTE — ED Triage Notes (Signed)
34 yo female c/o midback pain radiating to chest for the past 2 days. Pain is intermittent. Painful to take deep breath. Denies fall, trauma or injury. Denies urinary s/sx.

## 2020-01-09 NOTE — ED Provider Notes (Signed)
[facility] DEPT Provider Note   CSN: [id number] Arrival date & time: 01/09/20  1037     History No chief complaint on file.   Paula Warner is a 34 y.o. female presenting for evaluation of back pain.  Patient states for the past 2 days, she has had back pain that is radiating to her chest.  Pain is worse when she takes a deep breath in.  Pain is mid/upper back.  Initially it was mild, but worsened significantly last night.  Currently pain is 10 out of 10.  Patient states she works at a daycare, and does a lot of lifting.  She denies fall or trauma.  She has tried ibuprofen and warm shower with minimal improvement of symptoms.  Additionally, patient states she has had a mild cough for the past 1 to 2 weeks.  It is nonproductive.  She denies fevers or chills.  She reports nausea, no vomiting.  No abdominal pain urinary symptoms, abnormal bowel movements.  No leg pain or swelling.  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  She does not know/was unaware of a diagnosis of may Thurner syndrome.  She denies history of ESRD, but states she does have recurrent kidney stones.  Additional history came for chart review.  Patient carries a diagnosis of ESRD, however on review of labs, patient creatinine and kidney function is normal.  HPI     Past Medical History:  Diagnosis Date  . Asthma   . History of asthma    no current med.  Marland Kitchen 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  . History of kidney stones   . Menstrual periods irregular     Patient Active Problem List   Diagnosis Date Noted  . 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 ESRD (end stage renal disease) ([place]) 07/11/2016  . DVT (deep venous thrombosis) ([place]) 07/11/2016  . DVT of lower extremity (deep venous thrombosis) ([place]) 01/24/2016  . High risk sexual behavior 08/04/2015    Past Surgical History:  Procedure Laterality Date   . CESAREAN SECTION     x 2  . LAPAROSCOPIC TUBAL LIGATION Bilateral 05/08/2017   Procedure: LAPAROSCOPIC TUBAL LIGATION - FILSHIE CLIPS;  Surgeon: Emily Filbert, MD;  Location: [facility];  Service: Gynecology;  Laterality: Bilateral;  . LOWER EXTREMITY ANGIOGRAM Left 07/11/2016   Procedure: Percutaneous Venous Thrombectomy with IVUS and  with Stent Placement;  Surgeon: Serafina Mitchell, MD;  Location: [facility] OR;  Service: Vascular;  Laterality: Left;     OB History    Gravida  3   Para  1   Term      Preterm      AB  1   Living  2     SAB      TAB  1   Ectopic      Multiple  0   Live Births  2           Family History  Problem Relation Age of Onset  . Heart murmur Mother   . Asthma Mother   . Clotting disorder Father   . Heart disease Father     Social History   Tobacco Use  . Smoking status: Never Smoker  . Smokeless tobacco: Never Used  Substance Use Topics  . Alcohol use: No    Alcohol/week: 0.0 standard drinks  . Drug use: No    Home  Medications Prior to Admission medications   Medication Sig Start Date End Date Taking? Authorizing Provider  HYDROcodone-acetaminophen (NORCO/VICODIN) 5-325 MG tablet Take 1-2 tablets by mouth every 6 (six) hours as needed for severe pain. 11/22/19   Petrucelli, Samantha R, PA-C  ibuprofen (ADVIL) 600 MG tablet Take 1 tablet (600 mg total) by mouth every 8 (eight) hours as needed for moderate pain. 11/22/19   Petrucelli, Samantha R, PA-C  MELATONIN GUMMIES PO Take 10 mg by mouth at bedtime as needed (sleep).    [provider]  methocarbamol (ROBAXIN) 500 MG tablet Take 1 tablet (500 mg total) by mouth at bedtime as needed for muscle spasms. 01/09/20   Kai Railsback, PA-C  ondansetron (ZOFRAN ODT) 4 MG disintegrating tablet Take 1 tablet (4 mg total) by mouth every 8 (eight) hours as needed for nausea or vomiting. 11/22/19   Petrucelli, Aldona Bar R, PA-C  tamsulosin (FLOMAX) 0.4 MG CAPS capsule Take  1 capsule (0.4 mg total) by mouth daily after supper. 11/22/19   Petrucelli, Glynda Jaeger, PA-C  medroxyPROGESTERone (PROVERA) 10 MG tablet Take 1 tablet (10 mg total) by mouth daily. Use for ten days Patient not taking: Reported on 04/06/2019 10/28/18 11/22/19  Elvera Maria, CNM    Allergies    Zinacef [cefuroxime]  Review of Systems   Review of Systems  Respiratory: Positive for cough.   Cardiovascular: Positive for chest pain.  Gastrointestinal: Positive for nausea.  Musculoskeletal: Positive for back pain.  All other systems reviewed and are negative.   Physical Exam Updated Vital Signs BP 121/81   Pulse 90   Temp 98.2 F (36.8 C) (Oral)   Resp 19   Ht 5\' 1"  (1.549 m)   Wt 71.2 kg   LMP 12/26/2019   SpO2 99%   BMI 29.66 kg/m   Physical Exam Vitals and nursing note reviewed.  Constitutional:      General: She is not in acute distress.    Appearance: She is well-developed. She is obese.     Comments: Obese female resting comfortably in the bed in no acute distress  HENT:     Head: Normocephalic and atraumatic.  Eyes:     Conjunctiva/sclera: Conjunctivae normal.     Pupils: Pupils are equal, round, and reactive to light.  Cardiovascular:     Rate and Rhythm: Normal rate and regular rhythm.     Pulses: Normal pulses.  Pulmonary:     Effort: Pulmonary effort is normal. No respiratory distress.     Breath sounds: Normal breath sounds. No wheezing.     Comments: Clear lung sounds in all fields.  No tachypnea or tachycardia. Abdominal:     General: There is no distension.     Palpations: Abdomen is soft. There is no mass.     Tenderness: There is no abdominal tenderness. There is no guarding or rebound.     Comments: No tenderness palpation of the abdomen  Musculoskeletal:        General: Normal range of motion.     Cervical back: Normal range of motion and neck supple.     Right lower leg: No edema.     Left lower leg: No edema.     Comments: Tenderness  palpation of mid thoracic back musculature.  No focal pain over midline spine.  No step-offs or deformities. No leg pain or swelling  Skin:    General: Skin is warm and dry.     Capillary Refill: Capillary refill takes less than 2  seconds.  Neurological:     Mental Status: She is alert and oriented to person, place, and time.     ED Results / Procedures / Treatments   Labs (all labs ordered are listed, but only abnormal results are displayed) Labs Reviewed  D-DIMER, QUANTITATIVE (NOT AT [facility]) - Abnormal; Notable for the following components:      Result Value   D-Dimer, Quant 1.55 (*)    All other components within normal limits  COMPREHENSIVE METABOLIC PANEL - Abnormal; Notable for the following components:   Glucose, Bld 114 (*)    AST 579 (*)    ALT 520 (*)    Total Bilirubin 3.3 (*)    All other components within normal limits  CBC WITH DIFFERENTIAL/PLATELET  PREGNANCY, URINE  I-STAT BETA HCG BLOOD, ED ([facility], [facility], [facility] ONLY)  POC URINE PREG, ED    EKG EKG Interpretation  Date/Time:  Saturday January 09 2020 10:57:26 EDT Ventricular Rate:  62 PR Interval:    QRS Duration: 96 QT Interval:  431 QTC Calculation: 438 R Axis:   73 Text Interpretation: Sinus rhythm Borderline prolonged PR interval Confirmed by Virgel Manifold [phone]) on 01/09/2020 12:10:51 PM   Radiology DG Chest 2 View  Result Date: 01/09/2020 CLINICAL DATA:  Mid back pain radiating to the chest and abdomen for the past 2 days. EXAM: CHEST - 2 VIEW COMPARISON:  None. FINDINGS: Normal sized heart. Mild peribronchial thickening. Poor inspiration. Mild linear density at the left lung base. Mild scoliosis. IMPRESSION: 1. Mild bronchitic changes. 2. Poor inspiration with mild left basilar atelectasis. Electronically Signed   By: Claudie Revering M.D.   On: 01/09/2020 12:18   CT ANGIO CHEST PE W OR WO CONTRAST  Result Date: 01/09/2020 CLINICAL DATA:  Mid back pain radiating to the chest for 2 days with pleuritic  symptoms. Elevated D-dimer. EXAM: CT ANGIOGRAPHY CHEST WITH CONTRAST TECHNIQUE: Multidetector CT imaging of the chest was performed using the standard protocol during bolus administration of intravenous contrast. Multiplanar CT image reconstructions and MIPs were obtained to evaluate the vascular anatomy. CONTRAST:  161mL OMNIPAQUE IOHEXOL 350 MG/ML SOLN COMPARISON:  Chest radiograph from earlier today. FINDINGS: Cardiovascular: The study is high quality for the evaluation of pulmonary embolism. There are no filling defects in the central, lobar, segmental or subsegmental pulmonary artery branches to suggest acute pulmonary embolism. Great vessels are normal in course and caliber. Top-normal heart size. No significant pericardial fluid/thickening. Mediastinum/Nodes: No discrete thyroid nodules. Unremarkable esophagus. No pathologically enlarged axillary, mediastinal or hilar lymph nodes. Lungs/Pleura: No pneumothorax. No pleural effusion. No acute consolidative airspace disease, lung masses or significant pulmonary nodules. 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 Upper abdomen: Cholelithiasis. Musculoskeletal:  No aggressive appearing focal osseous lesions. Review of the MIP images confirms the above findings. IMPRESSION: 1. No pulmonary embolism. 2. 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 3. Cholelithiasis. Electronically Signed   By: Ilona Sorrel M.D.   On: 01/09/2020 14:55   US Abdomen Limited RUQ  Result Date: 01/09/2020 CLINICAL DATA:  Elevated liver function tests. EXAM: ULTRASOUND ABDOMEN LIMITED RIGHT UPPER QUADRANT COMPARISON:  April 06, 2019. FINDINGS: Gallbladder: The gallbladder lumen is full of gallstones without significant gallbladder wall thickening or pericholecystic fluid. Sonographic Murphy's sign could not be assessed as the patient had received morphine. Common  bile duct: Diameter: 4 mm which is within normal limits. Liver: No  focal lesion identified. Within normal limits in parenchymal echogenicity. Portal vein is patent on color Doppler imaging with normal direction of blood flow towards the liver. Other: None. IMPRESSION: Cholelithiasis is noted.  No definite evidence of cholecystitis. Electronically Signed   By: Marijo Conception M.D.   On: 01/09/2020 14:05    Procedures Procedures (including critical care time)  Medications Ordered in ED Medications  sodium chloride (PF) 0.9 % injection (has no administration in time range)  morphine 4 MG/ML injection 4 mg (4 mg Intravenous Given 01/09/20 1308)  iohexol (OMNIPAQUE) 350 MG/ML injection 100 mL (100 mLs Intravenous Contrast Given 01/09/20 1431)  methocarbamol (ROBAXIN) tablet 500 mg (500 mg Oral Given 01/09/20 1534)    ED Course  I have reviewed the triage vital signs and the nursing notes.  Pertinent labs & imaging results that were available during my care of the patient were reviewed by me and considered in my medical decision making (see chart for details).    MDM Rules/Calculators/A&P                      Patient presenting for evaluation of back pain that radiates to her chest.  Is associated pleuritic pain.  On exam, patient appears nontoxic.  Pain is reproducible with palpation of the back, likely MSK.  However, I am concerned about inspiratory pain in a patient with a previous history of DVTs.  Consider PE, although less likely considering normal vital signs.  Will obtain labs including D-dimer, EKG, and reassess.  Labs interpreted by me show elevation in D-dimer at 1.55.  Will obtain a CTA to rule out PE.  CMP shows elevation of LFTs and bili.  Consider gallbladder etiology for symptoms.  However patient is afebrile and without leukocytosis, doubt cholecystitis.  Will obtain right upper quadrant for further evaluation.  Right upper quadrant ultrasound shows gallstones without  cholecystitis.  No evidence of biliary duct dilation or obstruction. Case discussed with attending, Dr. Jaros Singer agrees to plan. Will consult with GI.   Discussed with Dr. Michail Sermon from GI, who recommends recheck of LFTs in 1 wk, f/u with general surgery. Feels pt does not need MRCP/ERCP/admission at this time.   CTA negative for PE.  Shows pulmonary AVM.  Patient is asymptomatic form pulmonary AVM and without hemoptysis at this time. Will have pt f/u with PCP.  Plan with patient.  At this time, patient appears safe for discharge.  Return precautions given.  Patient states she understands and agrees to plan.  Final Clinical Impression(s) / ED Diagnoses Final diagnoses:  Elevated LFTs  Upper back pain  Calculus of gallbladder without cholecystitis without obstruction  Pulmonary arteriovenous malformation    Rx / DC Orders ED Discharge Orders         Ordered    methocarbamol (ROBAXIN) 500 MG tablet  At bedtime PRN     01/09/20 1515           Consuelo Suthers, PA-C 01/09/20 1613    Virgel Manifold, MD 01/10/20 1129

## 2020-01-11 ENCOUNTER — Encounter (HOSPITAL_COMMUNITY): Payer: Self-pay | Admitting: *Deleted

## 2020-01-11 ENCOUNTER — Emergency Department (HOSPITAL_COMMUNITY): Payer: Self-pay

## 2020-01-11 ENCOUNTER — Emergency Department (HOSPITAL_COMMUNITY)
Admission: EM | Admit: 2020-01-11 | Discharge: 2020-01-11 | Disposition: A | Discharge status: Home / Self Care | Payer: Self-pay | Attending: Emergency Medicine | Admitting: Emergency Medicine

## 2020-01-11 ENCOUNTER — Other Ambulatory Visit: Payer: Self-pay

## 2020-01-11 DIAGNOSIS — K8020 Calculus of gallbladder without cholecystitis without obstruction: Secondary | ICD-10-CM | POA: Insufficient documentation

## 2020-01-11 DIAGNOSIS — R071 Chest pain on breathing: Secondary | ICD-10-CM | POA: Insufficient documentation

## 2020-01-11 DIAGNOSIS — J45909 Unspecified asthma, uncomplicated: Secondary | ICD-10-CM | POA: Insufficient documentation

## 2020-01-11 DIAGNOSIS — R1011 Right upper quadrant pain: Secondary | ICD-10-CM | POA: Insufficient documentation

## 2020-01-11 DIAGNOSIS — R1013 Epigastric pain: Secondary | ICD-10-CM | POA: Insufficient documentation

## 2020-01-11 DIAGNOSIS — K831 Obstruction of bile duct: Secondary | ICD-10-CM

## 2020-01-11 DIAGNOSIS — R350 Frequency of micturition: Secondary | ICD-10-CM | POA: Insufficient documentation

## 2020-01-11 LAB — CBC WITH DIFFERENTIAL/PLATELET
Abs Immature Granulocytes: 0.02 10*3/uL (ref 0.00–0.07)
Basophils Absolute: 0 10*3/uL (ref 0.0–0.1)
Basophils Relative: 0 %
Eosinophils Absolute: 0.1 10*3/uL (ref 0.0–0.5)
Eosinophils Relative: 1 %
HCT: 44.7 % (ref 36.0–46.0)
Hemoglobin: 14.9 g/dL (ref 12.0–15.0)
Immature Granulocytes: 0 %
Lymphocytes Relative: 32 %
Lymphs Abs: 3.1 10*3/uL (ref 0.7–4.0)
MCH: 31.5 pg (ref 26.0–34.0)
MCHC: 33.3 g/dL (ref 30.0–36.0)
MCV: 94.5 fL (ref 80.0–100.0)
Monocytes Absolute: 0.9 10*3/uL (ref 0.1–1.0)
Monocytes Relative: 10 %
Neutro Abs: 5.5 10*3/uL (ref 1.7–7.7)
Neutrophils Relative %: 57 %
Platelets: 212 10*3/uL (ref 150–400)
RBC: 4.73 MIL/uL (ref 3.87–5.11)
RDW: 12.7 % (ref 11.5–15.5)
WBC: 9.6 10*3/uL (ref 4.0–10.5)
nRBC: 0 % (ref 0.0–0.2)

## 2020-01-11 LAB — COMPREHENSIVE METABOLIC PANEL
ALT: 418 U/L — ABNORMAL HIGH (ref 0–44)
AST: 177 U/L — ABNORMAL HIGH (ref 15–41)
Albumin: 4.1 g/dL (ref 3.5–5.0)
Alkaline Phosphatase: 212 U/L — ABNORMAL HIGH (ref 38–126)
Anion gap: 8 (ref 5–15)
BUN: 13 mg/dL (ref 6–20)
CO2: 30 mmol/L (ref 22–32)
Calcium: 9.3 mg/dL (ref 8.9–10.3)
Chloride: 103 mmol/L (ref 98–111)
Creatinine, Ser: 0.63 mg/dL (ref 0.44–1.00)
GFR calc Af Amer: >60 mL/min (ref >60)
GFR calc non Af Amer: >60 mL/min (ref >60)
Glucose, Bld: 100 mg/dL — ABNORMAL HIGH (ref 70–99)
Potassium: 3.4 mmol/L — ABNORMAL LOW (ref 3.5–5.1)
Sodium: 141 mmol/L (ref 135–145)
Total Bilirubin: 4 mg/dL — ABNORMAL HIGH (ref 0.3–1.2)
Total Protein: 8.1 g/dL (ref 6.5–8.1)

## 2020-01-11 LAB — URINALYSIS, ROUTINE W REFLEX MICROSCOPIC
Bilirubin Urine: NEGATIVE
Glucose, UA: NEGATIVE mg/dL
Hgb urine dipstick: NEGATIVE
Ketones, ur: 20 mg/dL — AB
Leukocytes,Ua: NEGATIVE
Nitrite: NEGATIVE
Protein, ur: 30 mg/dL — AB
Specific Gravity, Urine: 1.029 (ref 1.005–1.030)
pH: 6 (ref 5.0–8.0)

## 2020-01-11 LAB — I-STAT BETA HCG BLOOD, ED (MC, WL, AP ONLY): I-stat hCG, quantitative: <5 m[IU]/mL (ref <5)

## 2020-01-11 LAB — LIPASE, BLOOD: Lipase: 43 U/L (ref 11–51)

## 2020-01-11 IMAGING — MR MR 3D RECON AT [facility]
8 of 12 series · 30 of 48 positions shown · non-contrast
Comparison: No prior abdominal MRI. Abdominal ultrasound
[DATE].

CLINICAL DATA: 33-year-old female with history of abdominal pain.
Cholelithiasis noted on recent ultrasound examination.

EXAM:
MRI ABDOMEN WITHOUT CONTRAST  (INCLUDING MRCP)
TECHNIQUE: Multiplanar multisequence MR imaging of the abdomen was performed.
Heavily T2-weighted images of the biliary and pancreatic ducts were
obtained, and three-dimensional MRCP images were rendered by post
processing.

[Series 4: T2 fat-sat · axial · 6.0mm · 1.25mm/px · z∈[-91,+161]mm · 2 of 36 slices shown]
[im 1/36]
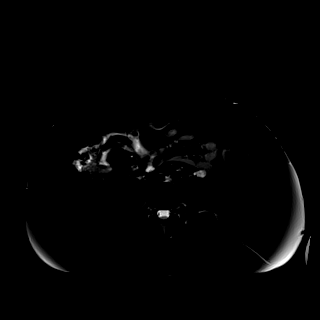
[im 36/36]
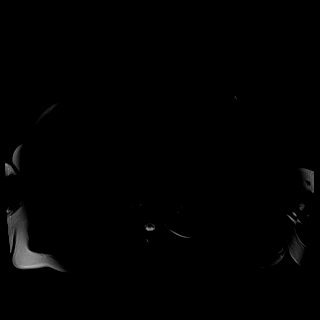

[Series 6: bSSFP · coronal · 6.0mm · 0.74mm/px · 3 of 35 slices shown]
[im 1/35]
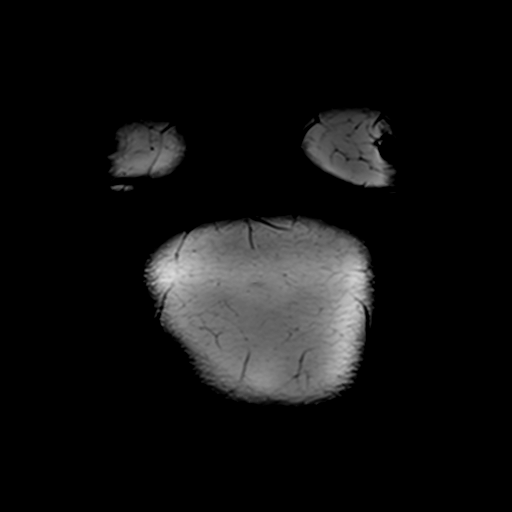
[im 18/35]
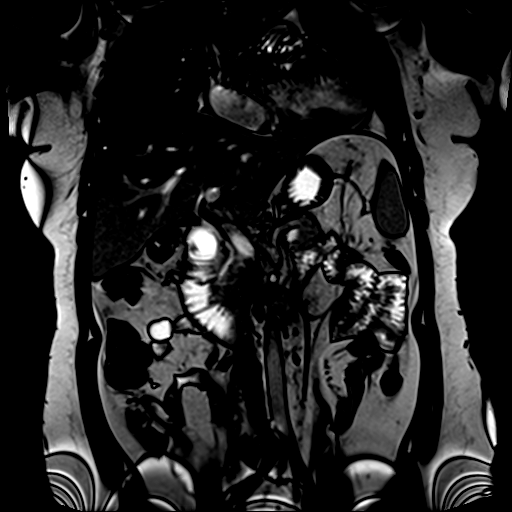
[im 35/35]
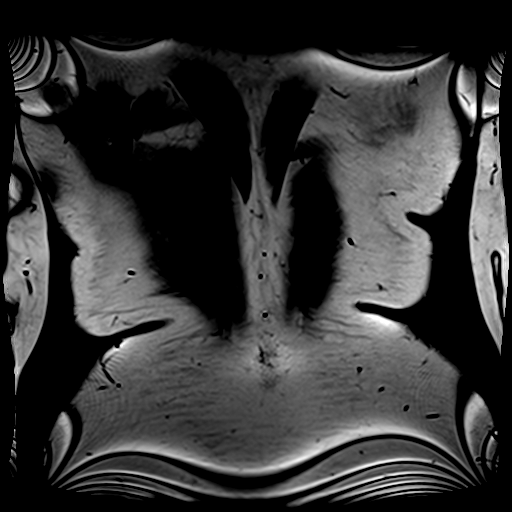

[Series 7: DWI · axial · 6.0mm · 1.49mm/px · z∈[-95,+157]mm · 6 of 72 slices shown (1 of 2)]
[im 1/72]
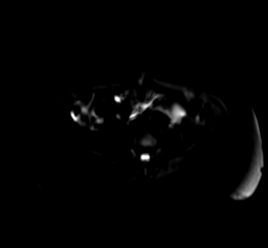
[im 15/72]
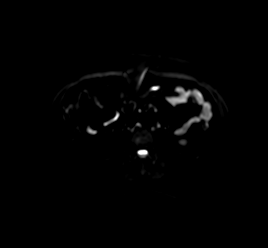
[im 29/72]
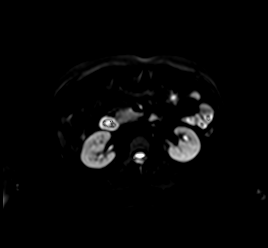
[im 43/72]
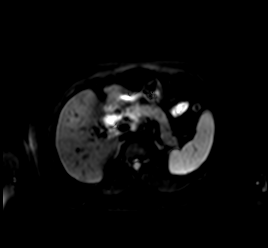
[im 57/72]
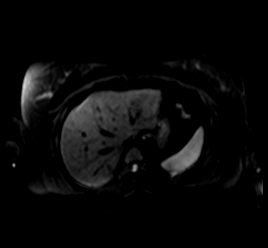
[im 72/72]
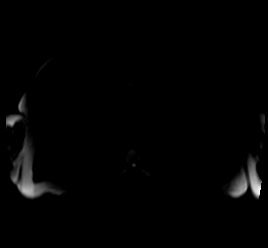

[Series 8: DWI · axial · 6.0mm · 1.49mm/px · z∈[-95,+157]mm · 3 of 36 slices shown (2 of 2)]
[im 1/36]
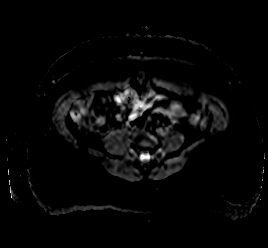
[im 18/36]
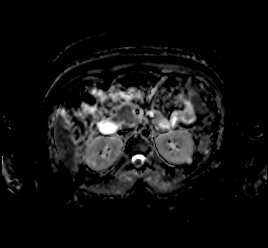
[im 36/36]
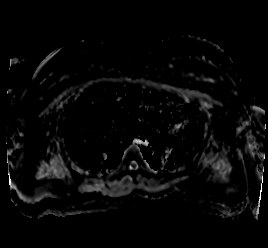

[Series 9: T1 · axial · 3.0mm · 1.25mm/px · z∈[-81,+156]mm · 6 of 80 slices shown (1 of 2)]
[im 1/80]
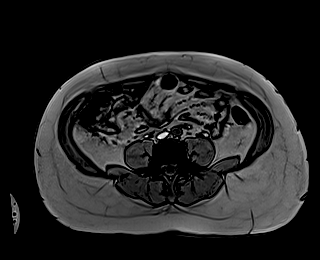
[im 16/80]
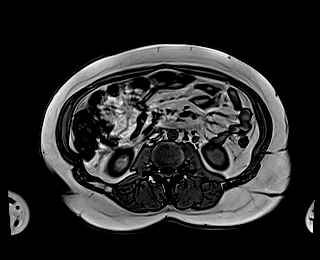
[im 32/80]
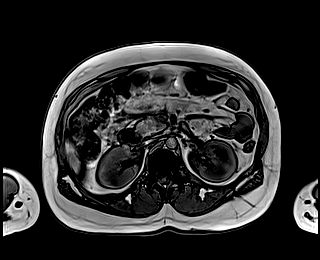
[im 48/80]
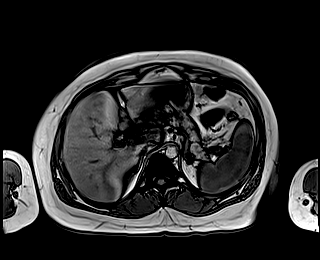
[im 64/80]
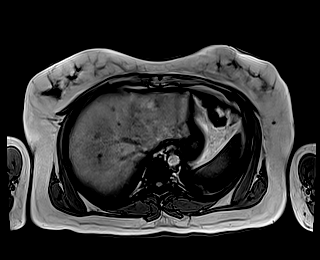
[im 80/80]
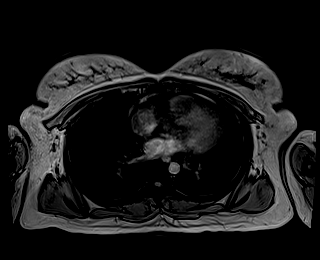

[Series 10: T1 · axial · 3.0mm · 1.25mm/px · z∈[-81,+156]mm · 6 of 80 slices shown (2 of 2)]
[im 1/80]
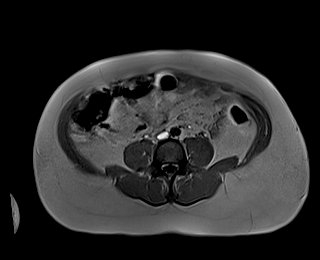
[im 16/80]
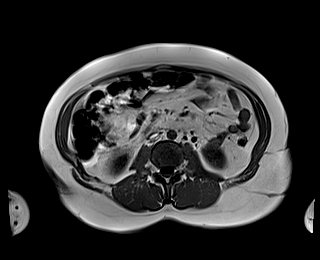
[im 32/80]
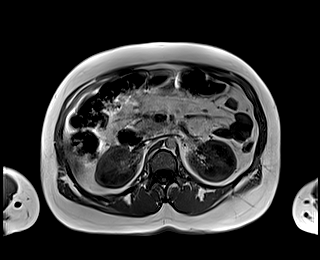
[im 48/80]
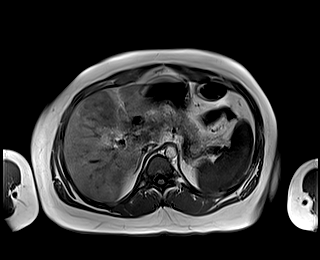
[im 64/80]
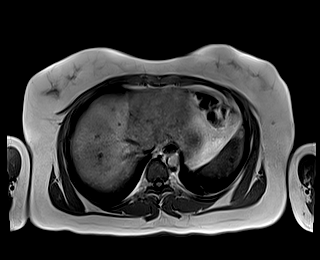
[im 80/80]
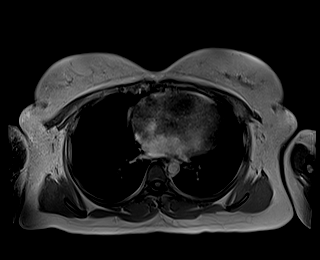

[Series 11: cor obl thk · sagittal · 50.0mm · 0.78mm/px · 1 of 9 slices shown]
[im 1/9]
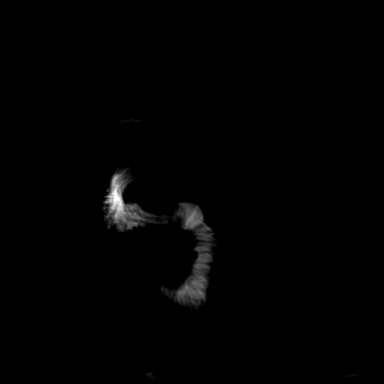

[Series 13: cor_3d_spc_trig · coronal · 1.0mm · 0.49mm/px · 3 of 80 slices shown]
[im 1/80]
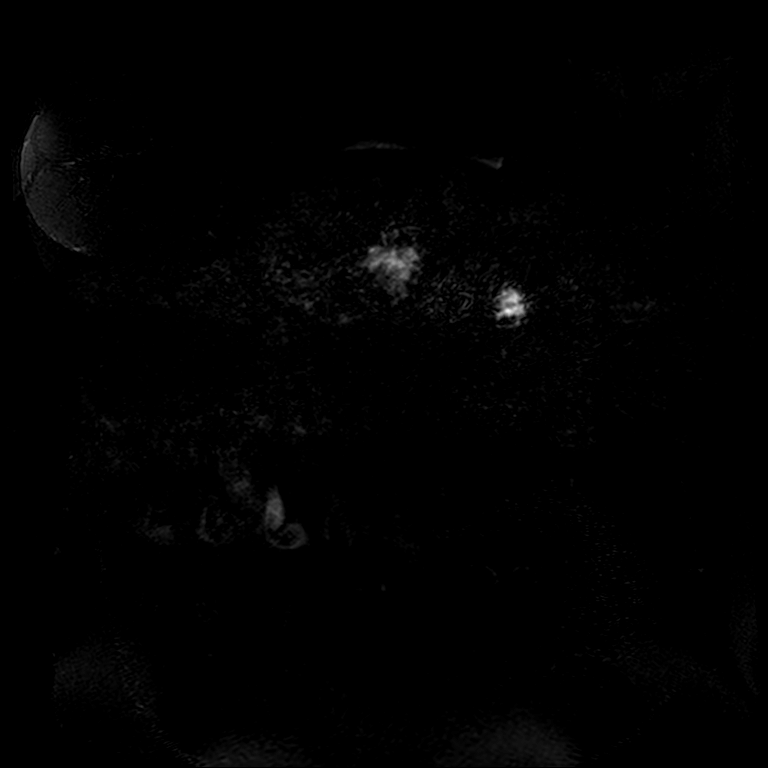
[im 16/80]
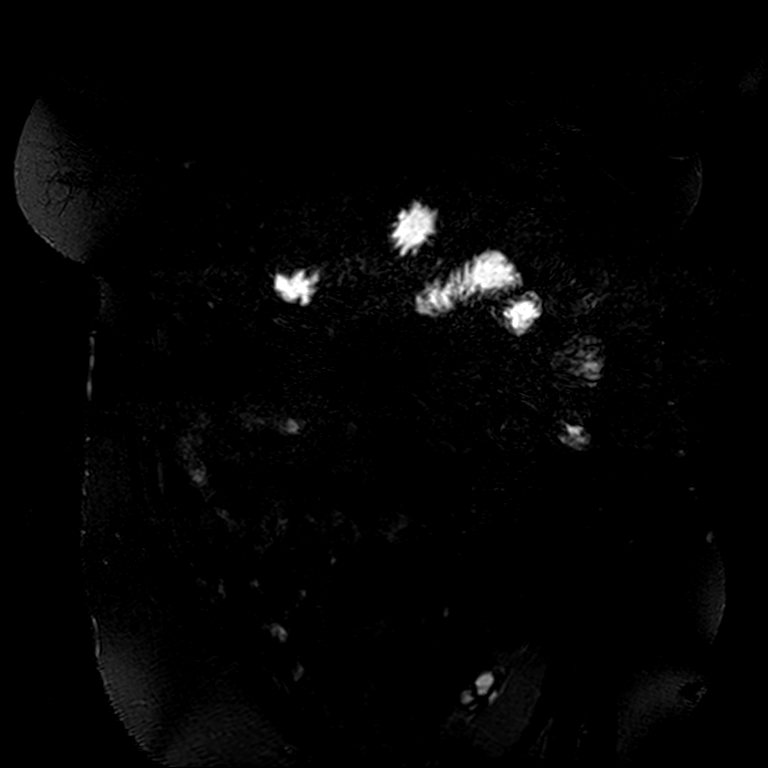
[im 32/80]
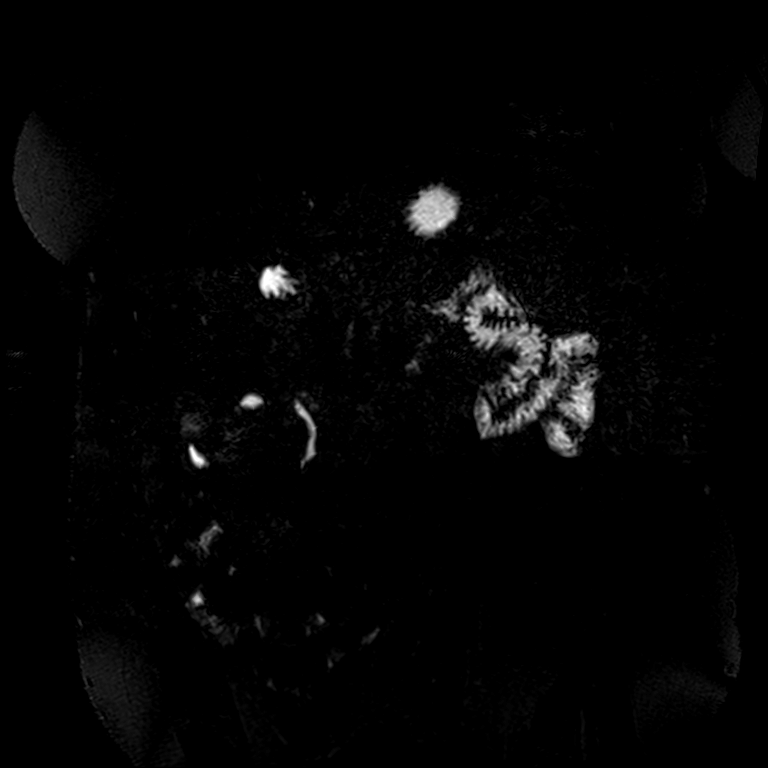

[30 of 48 positions shown; findings below may reference images not displayed]

FINDINGS: Comment: Today's study is limited for detection and characterization
of visceral and/or vascular lesions by lack of IV gadolinium.

Lower chest: Unremarkable.

Hepatobiliary: No suspicious cystic or solid hepatic lesions are
confidently identified on today's noncontrast examination. No intra
or extrahepatic biliary ductal dilatation. Common bile duct measures
3 mm in the porta hepatis. No filling defect in the common bile
duct. There multiple filling defects filling the lumen of the
gallbladder, compatible with cholelithiasis. No gallbladder wall
thickening or pericholecystic fluid.

Pancreas: No definite pancreatic mass. No pancreatic ductal
dilatation noted on MRCP images. No pancreatic or peripancreatic
fluid collections or inflammatory changes.

Spleen:  Unremarkable.

Adrenals/Urinary Tract: 7 mm T1 hypointense, T2 hyperintense lesion
in the upper pole the left kidney, incompletely characterized on
today's noncontrast examination, but statistically likely to
represent a cyst. Right kidney and bilateral adrenal glands are
normal in appearance. No hydroureteronephrosis in the visualized
portions of the abdomen.

Stomach/Bowel: Visualized portions are unremarkable.

Vascular/Lymphatic: No aneurysm identified in the visualized
abdominal vasculature. No lymphadenopathy noted in the abdomen.

Other: No significant volume of ascites noted in the visualized
portions of the peritoneal cavity.

Musculoskeletal: No aggressive appearing osseous lesions are noted
in the visualized portions of the skeleton.
IMPRESSION: 1. Cholelithiasis without evidence of acute cholecystitis at this
time.
2. No evidence of cholelithiasis or findings to suggest biliary
tract obstruction.

## 2020-01-11 IMAGING — MR MR MRCP
9 of 13 series · 31 of 48 positions shown · non-contrast
Comparison: No prior abdominal MRI. Abdominal ultrasound
[DATE].

CLINICAL DATA: 33-year-old female with history of abdominal pain.
Cholelithiasis noted on recent ultrasound examination.

EXAM:
MRI ABDOMEN WITHOUT CONTRAST  (INCLUDING MRCP)
TECHNIQUE: Multiplanar multisequence MR imaging of the abdomen was performed.
Heavily T2-weighted images of the biliary and pancreatic ducts were
obtained, and three-dimensional MRCP images were rendered by post
processing.

[Series 4: T2 fat-sat · axial · 6.0mm · 1.25mm/px · z∈[-91,+161]mm · 2 of 36 slices shown]
[im 1/36]
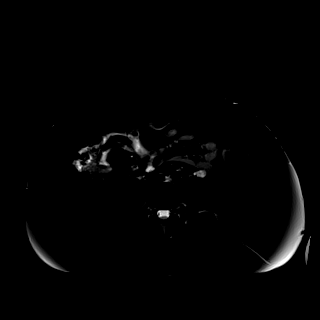
[im 36/36]
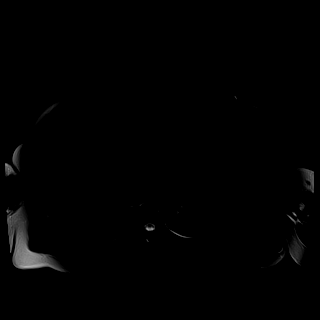

[Series 6: bSSFP · coronal · 6.0mm · 0.74mm/px · 3 of 35 slices shown]
[im 1/35]
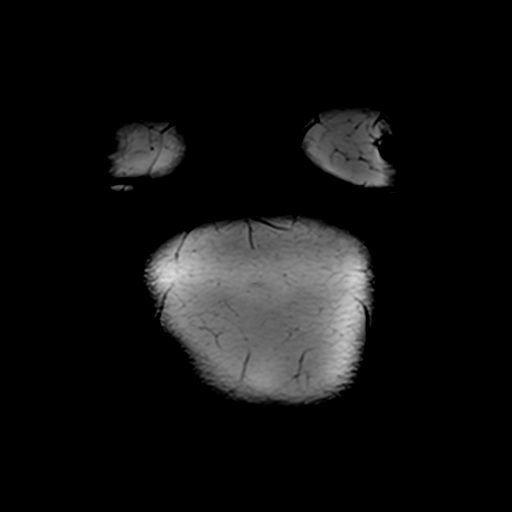
[im 18/35]
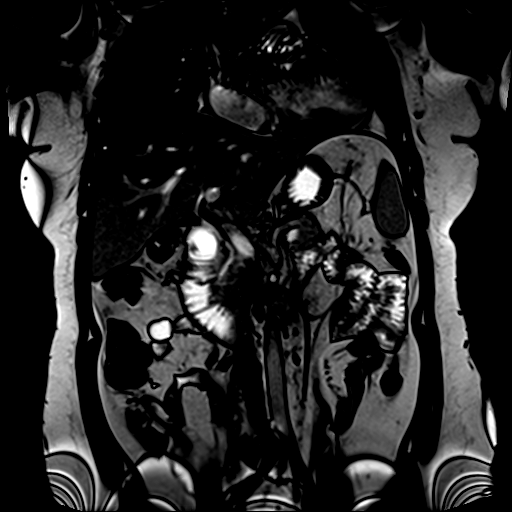
[im 35/35]
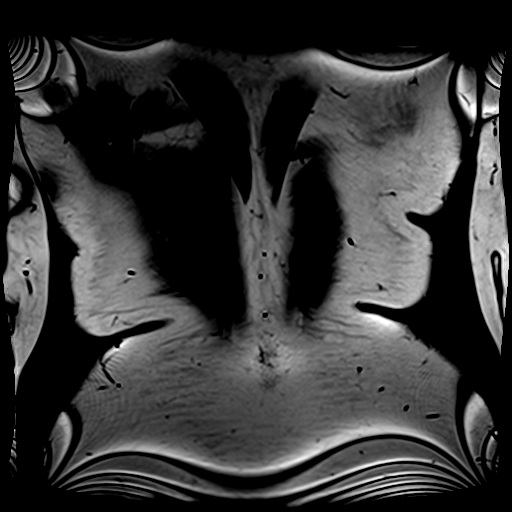

[Series 7: DWI · axial · 6.0mm · 1.49mm/px · z∈[-95,+157]mm · 6 of 72 slices shown (1 of 2)]
[im 1/72]
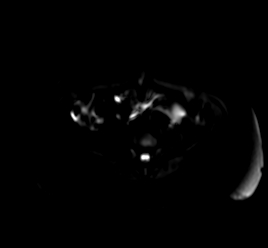
[im 15/72]
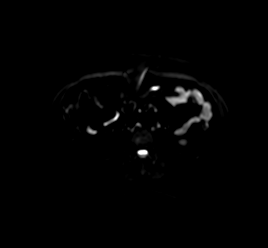
[im 29/72]
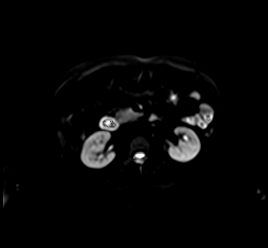
[im 43/72]
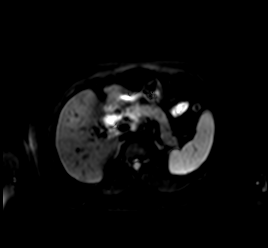
[im 57/72]
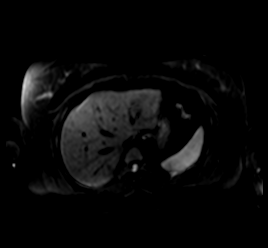
[im 72/72]
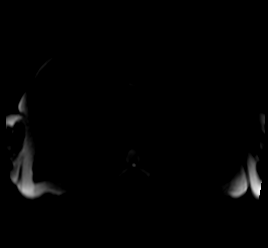

[Series 8: DWI · axial · 6.0mm · 1.49mm/px · z∈[-95,+157]mm · 3 of 36 slices shown (2 of 2)]
[im 1/36]
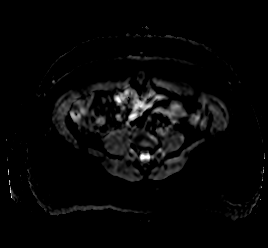
[im 18/36]
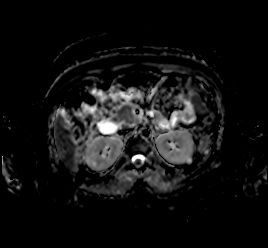
[im 36/36]
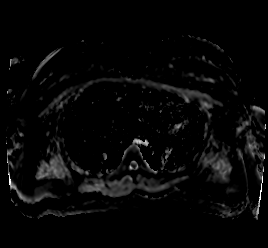

[Series 9: T1 · axial · 3.0mm · 1.25mm/px · z∈[-81,+156]mm · 6 of 80 slices shown (1 of 2)]
[im 1/80]
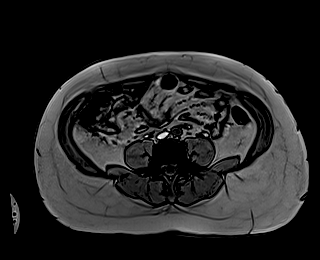
[im 16/80]
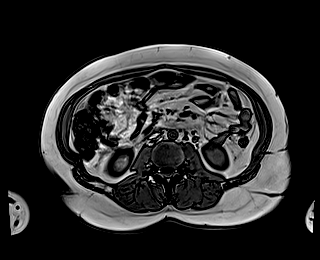
[im 32/80]
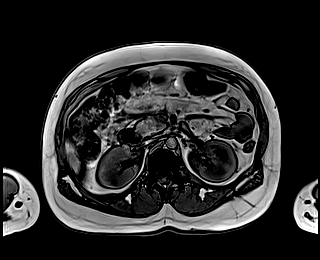
[im 48/80]
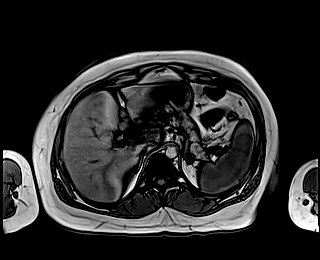
[im 64/80]
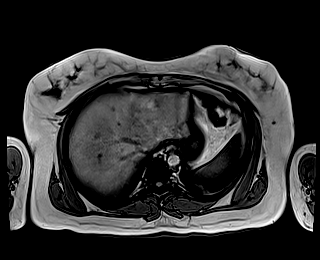
[im 80/80]
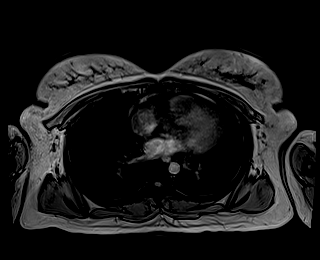

[Series 10: T1 · axial · 3.0mm · 1.25mm/px · z∈[-81,+156]mm · 6 of 80 slices shown (2 of 2)]
[im 1/80]
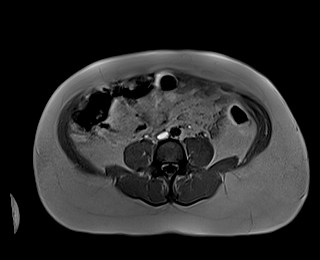
[im 16/80]
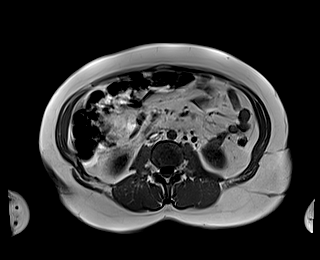
[im 32/80]
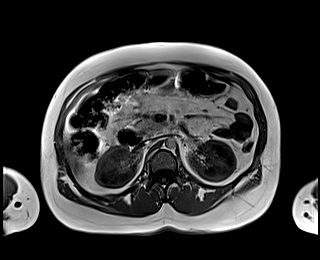
[im 48/80]
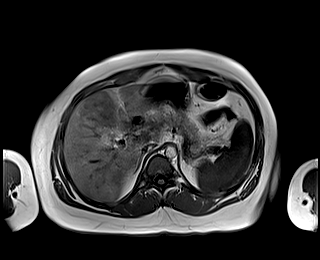
[im 64/80]
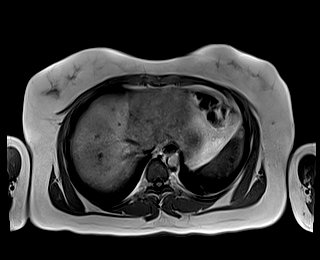
[im 80/80]
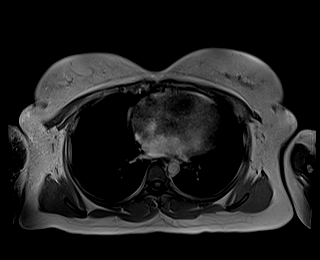

[Series 11: cor obl thk · sagittal · 50.0mm · 0.78mm/px · 1 of 9 slices shown]
[im 1/9]
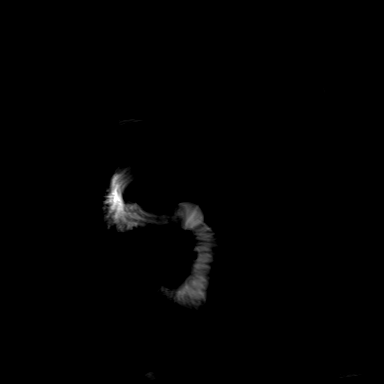

[Series 12: cor_3d_spc_trig-resp · 1 of 9 slices shown]
[im 1/9]
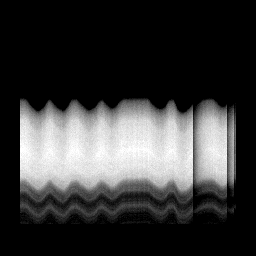

[Series 13: cor_3d_spc_trig · coronal · 1.0mm · 0.49mm/px · 3 of 80 slices shown]
[im 1/80]
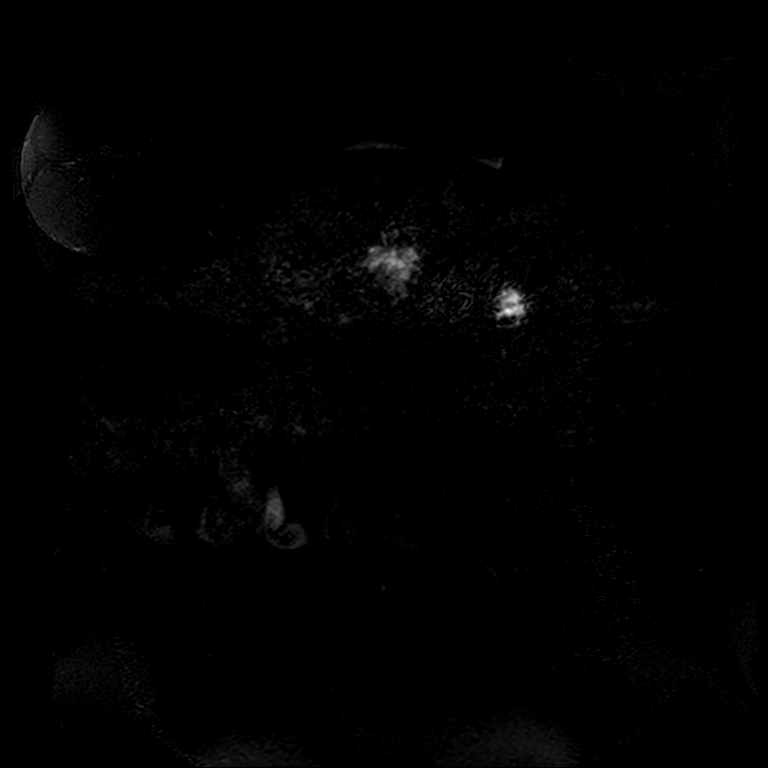
[im 16/80]
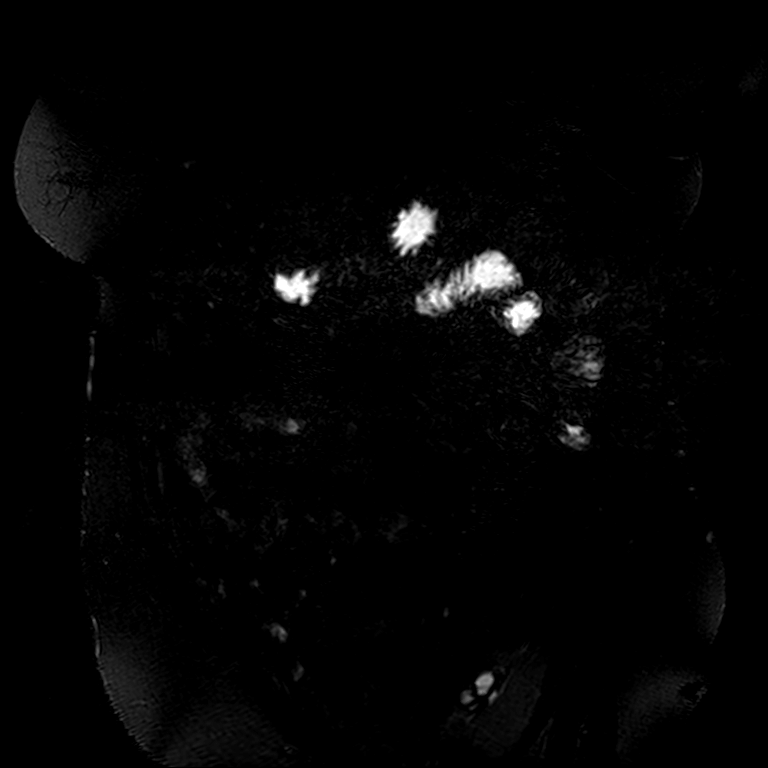
[im 32/80]
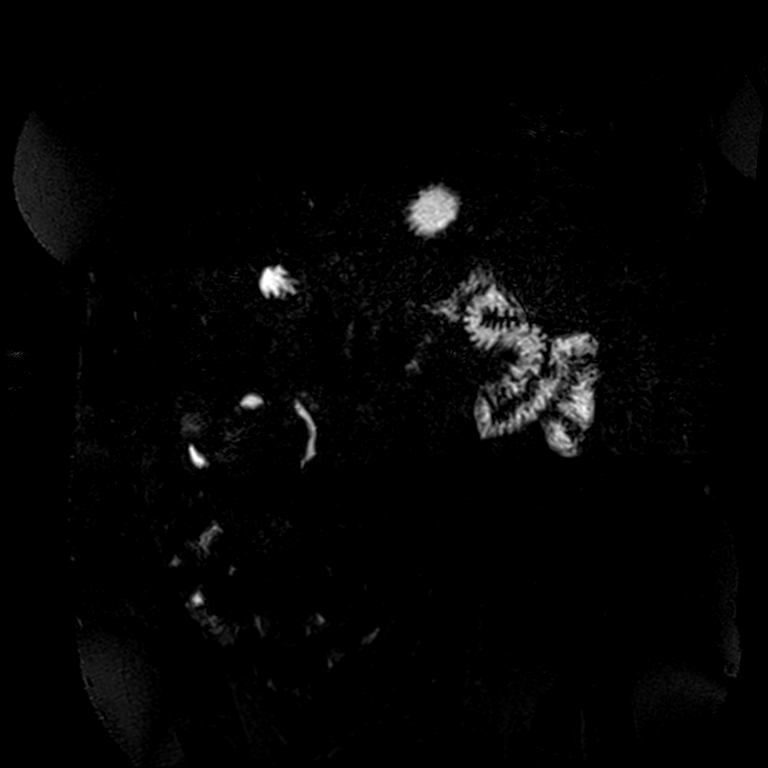

[31 of 48 positions shown; findings below may reference images not displayed]

FINDINGS: Comment: Today's study is limited for detection and characterization
of visceral and/or vascular lesions by lack of IV gadolinium.

Lower chest: Unremarkable.

Hepatobiliary: No suspicious cystic or solid hepatic lesions are
confidently identified on today's noncontrast examination. No intra
or extrahepatic biliary ductal dilatation. Common bile duct measures
3 mm in the porta hepatis. No filling defect in the common bile
duct. There multiple filling defects filling the lumen of the
gallbladder, compatible with cholelithiasis. No gallbladder wall
thickening or pericholecystic fluid.

Pancreas: No definite pancreatic mass. No pancreatic ductal
dilatation noted on MRCP images. No pancreatic or peripancreatic
fluid collections or inflammatory changes.

Spleen:  Unremarkable.

Adrenals/Urinary Tract: 7 mm T1 hypointense, T2 hyperintense lesion
in the upper pole the left kidney, incompletely characterized on
today's noncontrast examination, but statistically likely to
represent a cyst. Right kidney and bilateral adrenal glands are
normal in appearance. No hydroureteronephrosis in the visualized
portions of the abdomen.

Stomach/Bowel: Visualized portions are unremarkable.

Vascular/Lymphatic: No aneurysm identified in the visualized
abdominal vasculature. No lymphadenopathy noted in the abdomen.

Other: No significant volume of ascites noted in the visualized
portions of the peritoneal cavity.

Musculoskeletal: No aggressive appearing osseous lesions are noted
in the visualized portions of the skeleton.
IMPRESSION: 1. Cholelithiasis without evidence of acute cholecystitis at this
time.
2. No evidence of cholelithiasis or findings to suggest biliary
tract obstruction.

## 2020-01-11 MED ORDER — LORAZEPAM 2 MG/ML IJ SOLN
0.5000 mg | Freq: Once | INTRAMUSCULAR | Status: AC
  Administered 2020-01-11: 0.5 mg via INTRAVENOUS
  Filled 2020-01-11: qty 1

## 2020-01-11 NOTE — Discharge Instructions (Addendum)
Please follow up with [facility] to discuss management of your gallstones Please have your liver function tests rechecked in 1 week

## 2020-01-11 NOTE — ED Provider Notes (Signed)
[facility] DEPT Provider Note   CSN: [id number] Arrival date & time: 01/11/20  0815     History Chief Complaint  Patient presents with  . Back Pain  . Abdominal Pain    Paula Warner is a 34 y.o. female who presents to the ED today via EMS for continued pain to her mid back and epigastric/RUQ abdomen.  Chart review patient was seen in the ED 2 days ago with back pain radiating into her chest worse with deep inspiration.  D-dimer was obtained which was elevated at 1.55, patient with history of DVT.  CTA performed without findings of PE.  During ED visit CMP showed elevations in LFTs and T bili.  She had right upper quadrant ultrasound which showed gallstones without cholecystitis.  GI Dr. Michail Sermon was consulted who recommended patient have her LFTs rechecked in 1 week and follow-up with general surgery.  Patient reports she does not currently have any insurance and therefore has not followed up.  She states that she did not feel like she could go back to work today prompting her to call EMS.  She was prescribed Robaxin during ED visit which she states she has been taking, she has not taken anything else for her back pain or her abdominal pain.  She has been able to tolerate fluids at home as well as food without nausea or vomiting.  Patient does report that her urine seems darker than normal and she has had urinary frequency however denies dysuria.  Denies fevers or chills.   The history is provided by the patient and medical records.       Past Medical History:  Diagnosis Date  . Asthma   . History of asthma    no current med.  Marland Kitchen 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  . History of kidney stones   . Menstrual periods irregular     Patient Active Problem List   Diagnosis Date Noted  . 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 ESRD (end stage renal disease) ([place]) 07/11/2016  . DVT (deep venous thrombosis) ([place])  07/11/2016  . DVT of lower extremity (deep venous thrombosis) ([place]) 01/24/2016  . High risk sexual behavior 08/04/2015    Past Surgical History:  Procedure Laterality Date  . CESAREAN SECTION     x 2  . LAPAROSCOPIC TUBAL LIGATION Bilateral 05/08/2017   Procedure: LAPAROSCOPIC TUBAL LIGATION - FILSHIE CLIPS;  Surgeon: Emily Filbert, MD;  Location: [facility];  Service: Gynecology;  Laterality: Bilateral;  . LOWER EXTREMITY ANGIOGRAM Left 07/11/2016   Procedure: Percutaneous Venous Thrombectomy with IVUS and  with Stent Placement;  Surgeon: Serafina Mitchell, MD;  Location: [facility];  Service: Vascular;  Laterality: Left;     OB History    Gravida  3   Para  1   Term      Preterm      AB  1   Living  2     SAB      TAB  1   Ectopic      Multiple  0   Live Births  2           Family History  Problem Relation Age of Onset  . Heart murmur Mother   . Asthma Mother   . Clotting disorder Father   . Heart disease Father     Social History   Tobacco Use  . Smoking status: Never Smoker  .  Smokeless tobacco: Never Used  Substance Use Topics  . Alcohol use: No    Alcohol/week: 0.0 standard drinks  . Drug use: No    Home Medications Prior to Admission medications   Medication Sig Start Date End Date Taking? Authorizing Provider  MELATONIN GUMMIES PO Take 10 mg by mouth at bedtime as needed (sleep).   Yes [provider]  methocarbamol (ROBAXIN) 500 MG tablet Take 1 tablet (500 mg total) by mouth at bedtime as needed for muscle spasms. 01/09/20  Yes Caccavale, Sophia, PA-C  HYDROcodone-acetaminophen (NORCO/VICODIN) 5-325 MG tablet Take 1-2 tablets by mouth every 6 (six) hours as needed for severe pain. Patient not taking: Reported on 01/11/2020 11/22/19   Petrucelli, Glynda Jaeger, PA-C  ibuprofen (ADVIL) 600 MG tablet Take 1 tablet (600 mg total) by mouth every 8 (eight) hours as needed for moderate pain. Patient not taking: Reported on 01/11/2020  11/22/19   Petrucelli, Samantha R, PA-C  ondansetron (ZOFRAN ODT) 4 MG disintegrating tablet Take 1 tablet (4 mg total) by mouth every 8 (eight) hours as needed for nausea or vomiting. Patient not taking: Reported on 01/11/2020 11/22/19   Petrucelli, Glynda Jaeger, PA-C  tamsulosin (FLOMAX) 0.4 MG CAPS capsule Take 1 capsule (0.4 mg total) by mouth daily after supper. Patient not taking: Reported on 01/11/2020 11/22/19   Petrucelli, Glynda Jaeger, PA-C  medroxyPROGESTERone (PROVERA) 10 MG tablet Take 1 tablet (10 mg total) by mouth daily. Use for ten days Patient not taking: Reported on 04/06/2019 10/28/18 11/22/19  Elvera Maria, CNM    Allergies    Zinacef [cefuroxime]  Review of Systems   Review of Systems  Constitutional: Negative for chills and fever.  Respiratory: Negative for shortness of breath.   Cardiovascular: Negative for chest pain.  Gastrointestinal: Positive for abdominal pain. Negative for constipation, diarrhea, nausea and vomiting.  Genitourinary: Positive for frequency. Negative for difficulty urinating, dysuria, flank pain and hematuria.  Musculoskeletal: Positive for back pain.  All other systems reviewed and are negative.   Physical Exam Updated Vital Signs BP 120/85 (BP Location: Right Arm)   Pulse 81   Temp 98.2 F (36.8 C) (Oral)   Resp 17   Ht '5\' 1"'  (1.549 m)   Wt 71.2 kg   LMP 12/26/2019   SpO2 96%   BMI 29.68 kg/m   Physical Exam Vitals and nursing note reviewed.  Constitutional:      Appearance: She is not ill-appearing or diaphoretic.     Comments: Sitting comfortably upright in bed  HENT:     Head: Normocephalic and atraumatic.  Eyes:     Conjunctiva/sclera: Conjunctivae normal.  Cardiovascular:     Rate and Rhythm: Normal rate and regular rhythm.     Heart sounds: Normal heart sounds.  Pulmonary:     Effort: Pulmonary effort is normal.     Breath sounds: Normal breath sounds. No wheezing, rhonchi or rales.  Abdominal:     Palpations:  Abdomen is soft.     Tenderness: There is abdominal tenderness in the right upper quadrant and epigastric area. There is no right CVA tenderness, left CVA tenderness, guarding or rebound.  Musculoskeletal:     Cervical back: Neck supple.     Comments: No C, T, or L midline spinal TTP. L > R parathoracic musculature TTP. ROM intact to neck and back. Strength 5/5 to BUE and BLEs. Sensation intact throughout. 2+ distal pulses bilaterally.   Skin:    General: Skin is warm and dry.  Neurological:  Mental Status: She is alert.     ED Results / Procedures / Treatments   Labs (all labs ordered are listed, but only abnormal results are displayed) Labs Reviewed  COMPREHENSIVE METABOLIC PANEL - Abnormal; Notable for the following components:      Result Value   Potassium 3.4 (*)    Glucose, Bld 100 (*)    AST 177 (*)    ALT 418 (*)    Alkaline Phosphatase 212 (*)    Total Bilirubin 4.0 (*)    All other components within normal limits  URINALYSIS, ROUTINE W REFLEX MICROSCOPIC - Abnormal; Notable for the following components:   Color, Urine AMBER (*)    APPearance HAZY (*)    Ketones, ur 20 (*)    Protein, ur 30 (*)    Bacteria, UA RARE (*)    All other components within normal limits  LIPASE, BLOOD  CBC WITH DIFFERENTIAL/PLATELET  I-STAT BETA HCG BLOOD, ED ([facility], [facility], [facility] ONLY)    EKG None  Radiology MR ABDOMEN MRCP WO CONTRAST  Result Date: 01/11/2020 CLINICAL DATA:  34 year old female with history of abdominal pain. Cholelithiasis noted on recent ultrasound examination. EXAM: MRI ABDOMEN WITHOUT CONTRAST  (INCLUDING MRCP) TECHNIQUE: Multiplanar multisequence MR imaging of the abdomen was performed. Heavily T2-weighted images of the biliary and pancreatic ducts were obtained, and three-dimensional MRCP images were rendered by post processing. COMPARISON:  No prior abdominal MRI. Abdominal ultrasound 01/09/2020. FINDINGS: Comment: Today's study is limited for detection and  characterization of visceral and/or vascular lesions by lack of IV gadolinium. Lower chest: Unremarkable. Hepatobiliary: No suspicious cystic or solid hepatic lesions are confidently identified on today's noncontrast examination. No intra or extrahepatic biliary ductal dilatation. Common bile duct measures 3 mm in the porta hepatis. No filling defect in the common bile duct. There multiple filling defects filling the lumen of the gallbladder, compatible with cholelithiasis. No gallbladder wall thickening or pericholecystic fluid. Pancreas: No definite pancreatic mass. No pancreatic ductal dilatation noted on MRCP images. No pancreatic or peripancreatic fluid collections or inflammatory changes. Spleen:  Unremarkable. Adrenals/Urinary Tract: 7 mm T1 hypointense, T2 hyperintense lesion in the upper pole the left kidney, incompletely characterized on today's noncontrast examination, but statistically likely to represent a cyst. Right kidney and bilateral adrenal glands are normal in appearance. No hydroureteronephrosis in the visualized portions of the abdomen. Stomach/Bowel: Visualized portions are unremarkable. Vascular/Lymphatic: No aneurysm identified in the visualized abdominal vasculature. No lymphadenopathy noted in the abdomen. Other: No significant volume of ascites noted in the visualized portions of the peritoneal cavity. Musculoskeletal: No aggressive appearing osseous lesions are noted in the visualized portions of the skeleton. IMPRESSION: 1. Cholelithiasis without evidence of acute cholecystitis at this time. 2. No evidence of cholelithiasis or findings to suggest biliary tract obstruction. Electronically Signed   By: Vinnie Langton M.D.   On: 01/11/2020 14:39   MR 3D Recon At Scanner  Result Date: 01/11/2020 CLINICAL DATA:  34 year old female with history of abdominal pain. Cholelithiasis noted on recent ultrasound examination. EXAM: MRI ABDOMEN WITHOUT CONTRAST  (INCLUDING MRCP) TECHNIQUE:  Multiplanar multisequence MR imaging of the abdomen was performed. Heavily T2-weighted images of the biliary and pancreatic ducts were obtained, and three-dimensional MRCP images were rendered by post processing. COMPARISON:  No prior abdominal MRI. Abdominal ultrasound 01/09/2020. FINDINGS: Comment: Today's study is limited for detection and characterization of visceral and/or vascular lesions by lack of IV gadolinium. Lower chest: Unremarkable. Hepatobiliary: No suspicious cystic or solid hepatic lesions are confidently identified  on today's noncontrast examination. No intra or extrahepatic biliary ductal dilatation. Common bile duct measures 3 mm in the porta hepatis. No filling defect in the common bile duct. There multiple filling defects filling the lumen of the gallbladder, compatible with cholelithiasis. No gallbladder wall thickening or pericholecystic fluid. Pancreas: No definite pancreatic mass. No pancreatic ductal dilatation noted on MRCP images. No pancreatic or peripancreatic fluid collections or inflammatory changes. Spleen:  Unremarkable. Adrenals/Urinary Tract: 7 mm T1 hypointense, T2 hyperintense lesion in the upper pole the left kidney, incompletely characterized on today's noncontrast examination, but statistically likely to represent a cyst. Right kidney and bilateral adrenal glands are normal in appearance. No hydroureteronephrosis in the visualized portions of the abdomen. Stomach/Bowel: Visualized portions are unremarkable. Vascular/Lymphatic: No aneurysm identified in the visualized abdominal vasculature. No lymphadenopathy noted in the abdomen. Other: No significant volume of ascites noted in the visualized portions of the peritoneal cavity. Musculoskeletal: No aggressive appearing osseous lesions are noted in the visualized portions of the skeleton. IMPRESSION: 1. Cholelithiasis without evidence of acute cholecystitis at this time. 2. No evidence of cholelithiasis or findings to suggest  biliary tract obstruction. Electronically Signed   By: Vinnie Langton M.D.   On: 01/11/2020 14:39    Procedures Procedures (including critical care time)  Medications Ordered in ED Medications  LORazepam (ATIVAN) injection 0.5 mg (0.5 mg Intravenous Given 01/11/20 1326)    ED Course  I have reviewed the triage vital signs and the nursing notes.  Pertinent labs & imaging results that were available during my care of the patient were reviewed by me and considered in my medical decision making (see chart for details).  Clinical Course as of Jan 10 1501  Mon Jan 11, 2020  1308 Discussed case with GI Dr. Michail Sermon who recommends MRCP in the ED and if no findings for stone in the CBD pt can be discharged home   [MV]    Clinical Course User Index [MV] Paula Maize, PA-C   MDM Rules/Calculators/A&P                      34 year old female presents to the ED today via EMS for continued back pain and abdominal pain.  Seen 2 days ago diagnosed with musculoskeletal back pain as well as cholelithiasis without cholecystitis.  Advised to follow-up with either GI, general surgery, [facility] wellness, urgent care to have her LFTs rechecked in a week.  Patient states she does not have insurance and has not followed up.  States that she did not think she could go back to work today due to her pain prompting her to return to the ED.  On arrival patient is afebrile, nontachycardic and nontachypneic.  She appears to be in no acute distress.  She is sitting comfortably upright in bed.  She has diffuse parathoracic musculature tenderness palpation however worse on the left than the right.  She also has epigastric and right upper quadrant tenderness palpation consistent with her cholelithiasis.  Patient has been able to tolerate food and liquids at home without any nausea or vomiting.  Given she is here today we will recheck lab work at this time to ensure her LFTs have not gotten worse.  Not appear she  has taken any thing else besides the Robaxin for her back pain.  Advised that she take ibuprofen as well for her pain.  If no elevation in LFTs today we will have patient follow-up outpatient as originally planned.  Give her  a couple days off of work which I suspect is her main concern today.   CBC without leukocytosis. Hgb stable.  CMP with AST 100, ALT 418 both decreased from 2 days ago; alk phos elevated at 212 today and t bili 4.0  Hepatic Function Latest Ref Rng & Units 01/11/2020 01/09/2020 11/22/2019  Total Protein 6.5 - 8.1 g/dL 8.1 8.0 7.5  Albumin 3.5 - 5.0 g/dL 4.1 4.2 3.9  AST 15 - 41 U/L 177(H) 579(H) 44(H)  ALT 0 - 44 U/L 418(H) 520(H) 63(H)  Alk Phosphatase 38 - 126 U/L 212(H) 124 105  Total Bilirubin 0.3 - 1.2 mg/dL 4.0(H) 3.3(H) 2.2(H)   Given this will consult gastroenterology at this time. Will hold off on repeat imaging awaiting GI reccs  Dr. Michail Sermon recommends MRCP in the ED. If no findings of stone in the CBD pt can be discharged home given well appearance.   MRCP without findings of CBD dilation. Cholelithiasis without cholecystitis. Pt to be discharged home at this time with instructions to follow up outpatient with [facility] [place] [facility] to discuss management of gallstones. She is advised to have her LFTs rechecked in 1 weeks time by PCP. Info for [facility] has been given. Strict return precautions discussed including worsening abdominal pain, inability to control pain at home, intractable emesis, fevers > 100.4. Pt is in agreement with plan and stable for discharge home.   This note was prepared using Dragon voice recognition software and may include unintentional dictation errors due to the inherent limitations of voice recognition software.  Final Clinical Impression(s) / ED Diagnoses Final diagnoses:  Calculus of gallbladder without cholecystitis without obstruction    Rx / DC Orders ED Discharge Orders    None       Discharge Instructions      Please follow up with [facility] to discuss management of your gallstones Please have your liver function tests rechecked in 1 week        Paula Maize, PA-C 01/11/20 [place], Julie, MD 01/11/20 [phone]

## 2020-01-11 NOTE — ED Triage Notes (Addendum)
BIB EMS after continued back pain, seen on Sat for same, was told she has "Calculus of gallbladder without cholecystitis without obstruction". States pain is not any better.  Pt states she has not followed up due to no insurance. She c/o more of abd pain with dark yellow urine today. Pt has been able to eat and drink.

## 2020-01-26 ENCOUNTER — Other Ambulatory Visit: Payer: Self-pay

## 2020-01-26 ENCOUNTER — Ambulatory Visit: Payer: Self-pay | Admitting: Surgery

## 2020-01-26 ENCOUNTER — Ambulatory Visit: Payer: Medicaid Other | Attending: Family Medicine | Admitting: Family Medicine

## 2020-01-26 NOTE — H&P (Signed)
History of Present Illness Paula Warner. Paula Wike MD; 01/26/2020 2:42 PM) The patient is a 34 year old female who presents for evaluation of gall stones. Referred by Dr. Isla Pence for gallstones  This is a 34 year old female who presents with several months of intermittent severe back pain and abdominal pain associated with nausea and vomiting. She denies any diarrhea or fever. These episodes have been fairly severe and have resulted in a couple of ER visits. She was noted to have elevated bilirubin levels as high as 4.0. MRCP was performed that showed no sign of choledocholithiasis. The patient did have some discolored urine during this time. At this point she is having just minimal intermittent right-sided back pain. Her urine has returned normal color. She denies any diarrhea and has normal bowel movements. She has been limiting her diet. She presents now to discuss cholecystectomy. The patient works as a Chemical engineer.  CLINICAL DATA: Elevated liver function tests.  EXAM: ULTRASOUND ABDOMEN LIMITED RIGHT UPPER QUADRANT  COMPARISON: April 06, 2019.  FINDINGS: Gallbladder:  The gallbladder lumen is full of gallstones without significant gallbladder wall thickening or pericholecystic fluid. Sonographic Murphy's sign could not be assessed as the patient had received morphine.  Common bile duct:  Diameter: 4 mm which is within normal limits.  Liver:  No focal lesion identified. Within normal limits in parenchymal echogenicity. Portal vein is patent on color Doppler imaging with normal direction of blood flow towards the liver.  Other: None.  IMPRESSION: Cholelithiasis is noted. No definite evidence of cholecystitis.   Electronically Signed By: Marijo Conception M.D.  CLINICAL DATA: 34 year old female with history of abdominal pain. Cholelithiasis noted on recent ultrasound examination.  EXAM: MRI ABDOMEN WITHOUT CONTRAST (INCLUDING  MRCP)  TECHNIQUE: Multiplanar multisequence MR imaging of the abdomen was performed. Heavily T2-weighted images of the biliary and pancreatic ducts were obtained, and three-dimensional MRCP images were rendered by post processing.  COMPARISON: No prior abdominal MRI. Abdominal ultrasound 01/09/2020.  FINDINGS: Comment: Today's study is limited for detection and characterization of visceral and/or vascular lesions by lack of IV gadolinium.  Lower chest: Unremarkable.  Hepatobiliary: No suspicious cystic or solid hepatic lesions are confidently identified on today's noncontrast examination. No intra or extrahepatic biliary ductal dilatation. Common bile duct measures 3 mm in the porta hepatis. No filling defect in the common bile duct. There multiple filling defects filling the lumen of the gallbladder, compatible with cholelithiasis. No gallbladder wall thickening or pericholecystic fluid.  Pancreas: No definite pancreatic mass. No pancreatic ductal dilatation noted on MRCP images. No pancreatic or peripancreatic fluid collections or inflammatory changes.  Spleen: Unremarkable.  Adrenals/Urinary Tract: 7 mm T1 hypointense, T2 hyperintense lesion in the upper pole the left kidney, incompletely characterized on today's noncontrast examination, but statistically likely to represent a cyst. Right kidney and bilateral adrenal glands are normal in appearance. No hydroureteronephrosis in the visualized portions of the abdomen.  Stomach/Bowel: Visualized portions are unremarkable.  Vascular/Lymphatic: No aneurysm identified in the visualized abdominal vasculature. No lymphadenopathy noted in the abdomen.  Other: No significant volume of ascites noted in the visualized portions of the peritoneal cavity.  Musculoskeletal: No aggressive appearing osseous lesions are noted in the visualized portions of the skeleton.  IMPRESSION: 1. Cholelithiasis without evidence of acute  cholecystitis at this time. 2. No evidence of cholelithiasis or findings to suggest biliary tract obstruction.   Electronically Signed By: Vinnie Langton M.D. On: 01/11/2020 14:39    Past Surgical History Paula Warner, [place]; 01/26/2020 2:03 PM)  Cesarean Section - Multiple  Diagnostic Studies History Paula Warner, [place]; 01/26/2020 2:03 PM) Mammogram never  Allergies Paula Warner, [place]; 01/26/2020 2:06 PM) Zinacef *CEPHALOSPORINS* Hives.  Medication History (Paula Warner, CMA; 01/26/2020 2:07 PM) Melatonin Gummies (2.5MG  Tablet Chewable, Oral) Active. Methocarbamol (500MG  Tablet, Oral) Active. Ibuprofen (600MG  Tablet, Oral) Active. Medications Reconciled  Social History Paula Warner, [place]; 01/26/2020 2:03 PM) Alcohol use Occasional alcohol use. Tobacco use Never smoker.  Family History Paula Warner, [place]; 01/26/2020 2:03 PM) Hypertension Father.  Pregnancy / Birth History Paula Warner, [place]; 01/26/2020 2:03 PM) Age at menarche 59 years. Gravida 5 Irregular periods Para 2  Other Problems (Paula Warner, [place]; 01/26/2020 2:03 PM) Asthma Back Pain Cholelithiasis Kidney Stone     Review of Systems (Paula Warner CMA; 01/26/2020 2:03 PM) General Not Present- Appetite Loss, Chills, Fatigue, Fever, Night Sweats, Weight Gain and Weight Loss. HEENT Present- Seasonal Allergies. Not Present- Earache, Hearing Loss, Hoarseness, Nose Bleed, Oral Ulcers, Ringing in the Ears, Sinus Pain, Sore Throat, Visual Disturbances, Wears glasses/contact lenses and Yellow Eyes. Respiratory Not Present- Bloody sputum, Chronic Cough, Difficulty Breathing, Snoring and Wheezing. Breast Not Present- Breast Mass, Breast Pain, Nipple Discharge and Skin Changes. Cardiovascular Not Present- Chest Pain, Difficulty Breathing Lying Down, Leg Cramps, Palpitations, Rapid Heart Rate, Shortness of Breath and Swelling of Extremities. Gastrointestinal Not Present- Abdominal Pain, Bloating,  Bloody Stool, Change in Bowel Habits, Chronic diarrhea, Constipation, Difficulty Swallowing, Excessive gas, Gets full quickly at meals, Hemorrhoids, Indigestion, Nausea, Rectal Pain and Vomiting. Musculoskeletal Present- Back Pain. Not Present- Joint Pain, Joint Stiffness, Muscle Pain, Muscle Weakness and Swelling of Extremities. Neurological Not Present- Decreased Memory, Fainting, Headaches, Numbness, Seizures, Tingling, Tremor, Trouble walking and Weakness.  Vitals (Paula Warner CMA; 01/26/2020 2:06 PM) 01/26/2020 2:05 PM Weight: 157 lb Height: 61in Body Surface Area: 1.7 m Body Mass Index: 29.66 kg/m  Temp.: 98.86F  Pulse: 86 (Regular)  P.OX: 98% (Room air) BP: 102/70(Sitting, Left Arm, Standard)        Physical Exam Rodman Key K. Loula Marcella MD; 01/26/2020 2:43 PM)  The physical exam findings are as follows: Note:Constitutional: WDWN in NAD, conversant, no obvious deformities Eyes: Pupils equal, round; sclera anicteric; moist conjunctiva; no lid lag HENT: Oral mucosa moist; good dentition Neck: No masses palpated, trachea midline; no thyromegaly Lungs: CTA bilaterally; normal respiratory effort CV: Regular rate and rhythm; no murmurs; extremities well-perfused with no edema Abd: +bowel sounds, soft, non-tender, no palpable organomegaly; no palpable hernias; healed umbilical laparoscopic site Musc: Normal gait; no apparent clubbing or cyanosis in extremities Lymphatic: No palpable cervical or axillary lymphadenopathy Skin: Warm, dry; no sign of jaundice Psychiatric - alert and oriented x 4; calm mood and affect    Assessment & Plan Rodman Key K. Montrae Braithwaite MD; 01/26/2020 2:38 PM)  CHRONIC CHOLECYSTITIS WITH CALCULUS (K80.10)  Current Plans Schedule for Surgery - Laparoscopic cholecystectomy with intraoperative cholangiogram. The surgical procedure has been discussed with the patient. Potential risks, benefits, alternative treatments, and expected outcomes have been explained.  All of the patient's questions at this time have been answered. The likelihood of reaching the patient's treatment goal is good. The patient understand the proposed surgical procedure and wishes to proceed.  Paula Warner. Georgette Dover, MD, [facility]  General/ Trauma Surgery   01/26/2020 2:44 PM

## 2020-01-26 NOTE — H&P (Signed)
History of Present Illness Paula Warner. Paula Durnin MD; 01/26/2020 2:42 PM) The patient is a 34 year old female who presents for evaluation of gall stones. Referred by Dr. Isla Pence for gallstones  This is a 34 year old female who presents with several months of intermittent severe back pain and abdominal pain associated with nausea and vomiting. She denies any diarrhea or fever. These episodes have been fairly severe and have resulted in a couple of ER visits. She was noted to have elevated bilirubin levels as high as 4.0. MRCP was performed that showed no sign of choledocholithiasis. The patient did have some discolored urine during this time. At this point she is having just minimal intermittent right-sided back pain. Her urine has returned normal color. She denies any diarrhea and has normal bowel movements. She has been limiting her diet. She presents now to discuss cholecystectomy. The patient works as a Chemical engineer.  CLINICAL DATA: Elevated liver function tests.  EXAM: ULTRASOUND ABDOMEN LIMITED RIGHT UPPER QUADRANT  COMPARISON: April 06, 2019.  FINDINGS: Gallbladder:  The gallbladder lumen is full of gallstones without significant gallbladder wall thickening or pericholecystic fluid. Sonographic Murphy's sign could not be assessed as the patient had received morphine.  Common bile duct:  Diameter: 4 mm which is within normal limits.  Liver:  No focal lesion identified. Within normal limits in parenchymal echogenicity. Portal vein is patent on color Doppler imaging with normal direction of blood flow towards the liver.  Other: None.  IMPRESSION: Cholelithiasis is noted. No definite evidence of cholecystitis.   Electronically Signed By: Marijo Conception M.D.  CLINICAL DATA: 34 year old female with history of abdominal pain. Cholelithiasis noted on recent ultrasound examination.  EXAM: MRI ABDOMEN WITHOUT CONTRAST (INCLUDING  MRCP)  TECHNIQUE: Multiplanar multisequence MR imaging of the abdomen was performed. Heavily T2-weighted images of the biliary and pancreatic ducts were obtained, and three-dimensional MRCP images were rendered by post processing.  COMPARISON: No prior abdominal MRI. Abdominal ultrasound 01/09/2020.  FINDINGS: Comment: Today's study is limited for detection and characterization of visceral and/or vascular lesions by lack of IV gadolinium.  Lower chest: Unremarkable.  Hepatobiliary: No suspicious cystic or solid hepatic lesions are confidently identified on today's noncontrast examination. No intra or extrahepatic biliary ductal dilatation. Common bile duct measures 3 mm in the porta hepatis. No filling defect in the common bile duct. There multiple filling defects filling the lumen of the gallbladder, compatible with cholelithiasis. No gallbladder wall thickening or pericholecystic fluid.  Pancreas: No definite pancreatic mass. No pancreatic ductal dilatation noted on MRCP images. No pancreatic or peripancreatic fluid collections or inflammatory changes.  Spleen: Unremarkable.  Adrenals/Urinary Tract: 7 mm T1 hypointense, T2 hyperintense lesion in the upper pole the left kidney, incompletely characterized on today's noncontrast examination, but statistically likely to represent a cyst. Right kidney and bilateral adrenal glands are normal in appearance. No hydroureteronephrosis in the visualized portions of the abdomen.  Stomach/Bowel: Visualized portions are unremarkable.  Vascular/Lymphatic: No aneurysm identified in the visualized abdominal vasculature. No lymphadenopathy noted in the abdomen.  Other: No significant volume of ascites noted in the visualized portions of the peritoneal cavity.  Musculoskeletal: No aggressive appearing osseous lesions are noted in the visualized portions of the skeleton.  IMPRESSION: 1. Cholelithiasis without evidence of acute  cholecystitis at this time. 2. No evidence of cholelithiasis or findings to suggest biliary tract obstruction.   Electronically Signed By: Vinnie Langton M.D. On: 01/11/2020 14:39    Past Surgical History Andreas Blower, [place]; 01/26/2020 2:03 PM)  Cesarean Section - Multiple  Diagnostic Studies History Andreas Blower, [place]; 01/26/2020 2:03 PM) Mammogram never  Allergies Andreas Blower, [place]; 01/26/2020 2:06 PM) Zinacef *CEPHALOSPORINS* Hives.  Medication History (Armen Glo Herring, CMA; 01/26/2020 2:07 PM) Melatonin Gummies (2.5MG  Tablet Chewable, Oral) Active. Methocarbamol (500MG  Tablet, Oral) Active. Ibuprofen (600MG  Tablet, Oral) Active. Medications Reconciled  Social History Andreas Blower, [place]; 01/26/2020 2:03 PM) Alcohol use Occasional alcohol use. Tobacco use Never smoker.  Family History Andreas Blower, [place]; 01/26/2020 2:03 PM) Hypertension Father.  Pregnancy / Birth History Andreas Blower, [place]; 01/26/2020 2:03 PM) Age at menarche 12 years. Gravida 5 Irregular periods Para 2  Other Problems (Armen Ferguson, [place]; 01/26/2020 2:03 PM) Asthma Back Pain Cholelithiasis Kidney Stone     Review of Systems (Armen Ferguson CMA; 01/26/2020 2:03 PM) General Not Present- Appetite Loss, Chills, Fatigue, Fever, Night Sweats, Weight Gain and Weight Loss. HEENT Present- Seasonal Allergies. Not Present- Earache, Hearing Loss, Hoarseness, Nose Bleed, Oral Ulcers, Ringing in the Ears, Sinus Pain, Sore Throat, Visual Disturbances, Wears glasses/contact lenses and Yellow Eyes. Respiratory Not Present- Bloody sputum, Chronic Cough, Difficulty Breathing, Snoring and Wheezing. Breast Not Present- Breast Mass, Breast Pain, Nipple Discharge and Skin Changes. Cardiovascular Not Present- Chest Pain, Difficulty Breathing Lying Down, Leg Cramps, Palpitations, Rapid Heart Rate, Shortness of Breath and Swelling of Extremities. Gastrointestinal Not Present- Abdominal Pain, Bloating,  Bloody Stool, Change in Bowel Habits, Chronic diarrhea, Constipation, Difficulty Swallowing, Excessive gas, Gets full quickly at meals, Hemorrhoids, Indigestion, Nausea, Rectal Pain and Vomiting. Musculoskeletal Present- Back Pain. Not Present- Joint Pain, Joint Stiffness, Muscle Pain, Muscle Weakness and Swelling of Extremities. Neurological Not Present- Decreased Memory, Fainting, Headaches, Numbness, Seizures, Tingling, Tremor, Trouble walking and Weakness.  Vitals (Armen Ferguson CMA; 01/26/2020 2:06 PM) 01/26/2020 2:05 PM Weight: 157 lb Height: 61in Body Surface Area: 1.7 m Body Mass Index: 29.66 kg/m  Temp.: 98.36F  Pulse: 86 (Regular)  P.OX: 98% (Room air) BP: 102/70(Sitting, Left Arm, Standard)        Physical Exam Rodman Key K. Emilyann Banka MD; 01/26/2020 2:43 PM)  The physical exam findings are as follows: Note:Constitutional: WDWN in NAD, conversant, no obvious deformities Eyes: Pupils equal, round; sclera anicteric; moist conjunctiva; no lid lag HENT: Oral mucosa moist; good dentition Neck: No masses palpated, trachea midline; no thyromegaly Lungs: CTA bilaterally; normal respiratory effort CV: Regular rate and rhythm; no murmurs; extremities well-perfused with no edema Abd: +bowel sounds, soft, non-tender, no palpable organomegaly; no palpable hernias; healed umbilical laparoscopic site Musc: Normal gait; no apparent clubbing or cyanosis in extremities Lymphatic: No palpable cervical or axillary lymphadenopathy Skin: Warm, dry; no sign of jaundice Psychiatric - alert and oriented x 4; calm mood and affect    Assessment & Plan Rodman Key K. Wilberta Dorvil MD; 01/26/2020 2:38 PM)  CHRONIC CHOLECYSTITIS WITH CALCULUS (K80.10)  Current Plans Schedule for Surgery - Laparoscopic cholecystectomy with intraoperative cholangiogram. The surgical procedure has been discussed with the patient. Potential risks, benefits, alternative treatments, and expected outcomes have been explained.  All of the patient's questions at this time have been answered. The likelihood of reaching the patient's treatment goal is good. The patient understand the proposed surgical procedure and wishes to proceed.  Paula Warner. Georgette Dover, MD, [facility] Surgery  General/ Trauma Surgery   01/26/2020 2:46 PM

## 2020-02-02 ENCOUNTER — Inpatient Hospital Stay (HOSPITAL_COMMUNITY)
Admission: EM | Admit: 2020-02-02 | Discharge: 2020-02-06 | DRG: 418 | Disposition: A | Discharge status: Home / Self Care | Payer: 59 | Attending: Internal Medicine | Admitting: Internal Medicine

## 2020-02-02 ENCOUNTER — Other Ambulatory Visit: Payer: Self-pay

## 2020-02-02 ENCOUNTER — Encounter (HOSPITAL_COMMUNITY): Payer: Self-pay | Admitting: Emergency Medicine

## 2020-02-02 ENCOUNTER — Emergency Department (HOSPITAL_COMMUNITY): Payer: 59

## 2020-02-02 DIAGNOSIS — Z20822 Contact with and (suspected) exposure to covid-19: Secondary | ICD-10-CM | POA: Diagnosis present

## 2020-02-02 DIAGNOSIS — K8012 Calculus of gallbladder with acute and chronic cholecystitis without obstruction: Secondary | ICD-10-CM | POA: Diagnosis present

## 2020-02-02 DIAGNOSIS — R7989 Other specified abnormal findings of blood chemistry: Secondary | ICD-10-CM | POA: Diagnosis not present

## 2020-02-02 DIAGNOSIS — J45909 Unspecified asthma, uncomplicated: Secondary | ICD-10-CM | POA: Diagnosis present

## 2020-02-02 DIAGNOSIS — Z86718 Personal history of other venous thrombosis and embolism: Secondary | ICD-10-CM

## 2020-02-02 DIAGNOSIS — E876 Hypokalemia: Secondary | ICD-10-CM | POA: Diagnosis present

## 2020-02-02 DIAGNOSIS — K7581 Nonalcoholic steatohepatitis (NASH): Secondary | ICD-10-CM | POA: Diagnosis present

## 2020-02-02 DIAGNOSIS — R7401 Elevation of levels of liver transaminase levels: Secondary | ICD-10-CM

## 2020-02-02 DIAGNOSIS — R1084 Generalized abdominal pain: Secondary | ICD-10-CM | POA: Diagnosis present

## 2020-02-02 DIAGNOSIS — K851 Biliary acute pancreatitis without necrosis or infection: Principal | ICD-10-CM | POA: Diagnosis present

## 2020-02-02 DIAGNOSIS — K8071 Calculus of gallbladder and bile duct without cholecystitis with obstruction: Secondary | ICD-10-CM

## 2020-02-02 LAB — URINALYSIS, ROUTINE W REFLEX MICROSCOPIC
Bilirubin Urine: NEGATIVE
Glucose, UA: NEGATIVE mg/dL
Hgb urine dipstick: NEGATIVE
Ketones, ur: NEGATIVE mg/dL
Leukocytes,Ua: NEGATIVE
Nitrite: NEGATIVE
Protein, ur: NEGATIVE mg/dL
Specific Gravity, Urine: 1.017 (ref 1.005–1.030)
pH: 7 (ref 5.0–8.0)

## 2020-02-02 LAB — COMPREHENSIVE METABOLIC PANEL
ALT: 385 U/L — ABNORMAL HIGH (ref 0–44)
AST: 453 U/L — ABNORMAL HIGH (ref 15–41)
Albumin: 4.3 g/dL (ref 3.5–5.0)
Alkaline Phosphatase: 139 U/L — ABNORMAL HIGH (ref 38–126)
Anion gap: 8 (ref 5–15)
BUN: 10 mg/dL (ref 6–20)
CO2: 29 mmol/L (ref 22–32)
Calcium: 9.2 mg/dL (ref 8.9–10.3)
Chloride: 105 mmol/L (ref 98–111)
Creatinine, Ser: 0.64 mg/dL (ref 0.44–1.00)
GFR calc Af Amer: >60 mL/min (ref >60)
GFR calc non Af Amer: >60 mL/min (ref >60)
Glucose, Bld: 113 mg/dL — ABNORMAL HIGH (ref 70–99)
Potassium: 3.7 mmol/L (ref 3.5–5.1)
Sodium: 142 mmol/L (ref 135–145)
Total Bilirubin: 3.3 mg/dL — ABNORMAL HIGH (ref 0.3–1.2)
Total Protein: 7.9 g/dL (ref 6.5–8.1)

## 2020-02-02 LAB — CBC
HCT: 43.1 % (ref 36.0–46.0)
Hemoglobin: 14.5 g/dL (ref 12.0–15.0)
MCH: 31.8 pg (ref 26.0–34.0)
MCHC: 33.6 g/dL (ref 30.0–36.0)
MCV: 94.5 fL (ref 80.0–100.0)
Platelets: 236 10*3/uL (ref 150–400)
RBC: 4.56 MIL/uL (ref 3.87–5.11)
RDW: 12.9 % (ref 11.5–15.5)
WBC: 8.9 10*3/uL (ref 4.0–10.5)
nRBC: 0 % (ref 0.0–0.2)

## 2020-02-02 LAB — PREGNANCY, URINE: Preg Test, Ur: NEGATIVE

## 2020-02-02 LAB — SARS CORONAVIRUS 2 BY RT PCR (HOSPITAL ORDER, PERFORMED IN ~~LOC~~ HOSPITAL LAB): SARS Coronavirus 2: NEGATIVE

## 2020-02-02 LAB — LIPASE, BLOOD: Lipase: 2172 U/L — ABNORMAL HIGH (ref 11–51)

## 2020-02-02 IMAGING — CT CT ABD-PELV W/ CM
2 of 4 series · 16 of 46 positions shown, 18 images · IV contrast (omnipaque)
Comparison: [DATE]

CLINICAL DATA: Upper abdominal pain with nausea and vomiting for
several weeks, history of cholelithiasis

EXAM:
CT ABDOMEN AND PELVIS WITH CONTRAST
TECHNIQUE: Multidetector CT imaging of the abdomen and pelvis was performed
using the standard protocol following bolus administration of
intravenous contrast.
CONTRAST:  100mL OMNIPAQUE IOHEXOL 300 MG/ML  SOLN

[Series 2: axial st · axial · 0.68mm/px · z∈[-312,+73]mm · 13 of 87 slices shown, 15 images]
[im 5/87  soft-tissue]
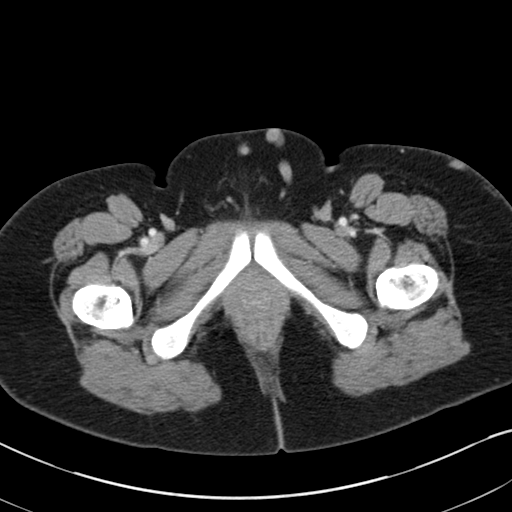
[im 5/87  bone]
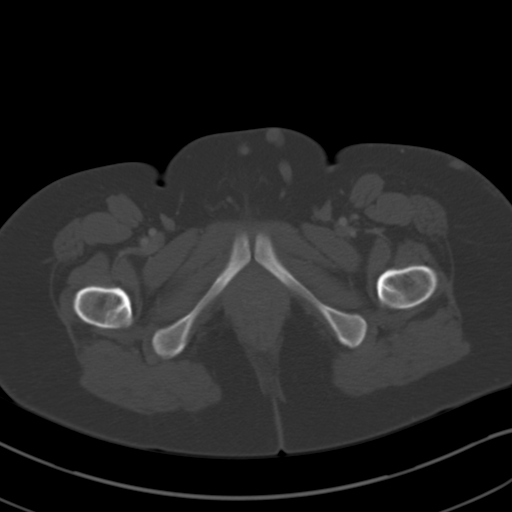
[im 13/87  soft-tissue]
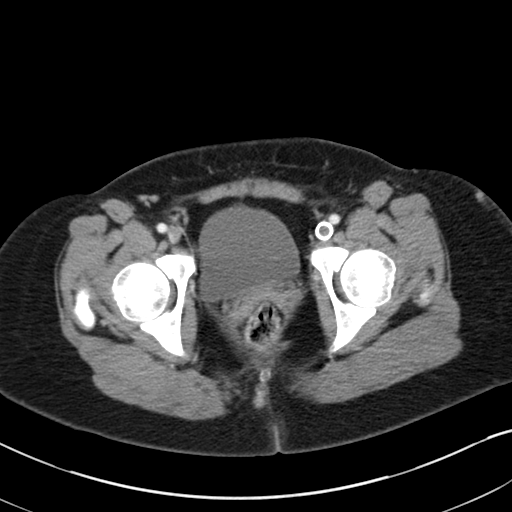
[im 18/87  soft-tissue]
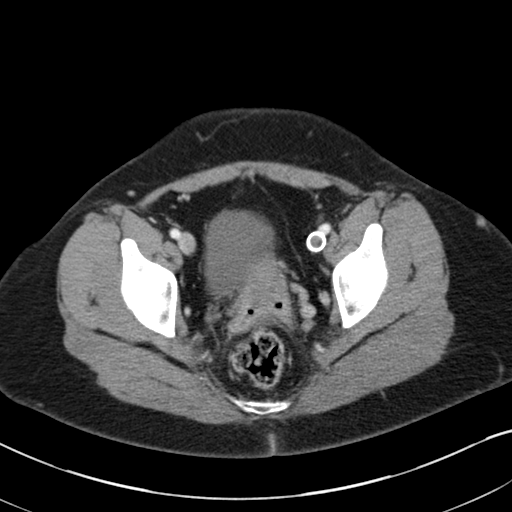
[im 26/87  soft-tissue]
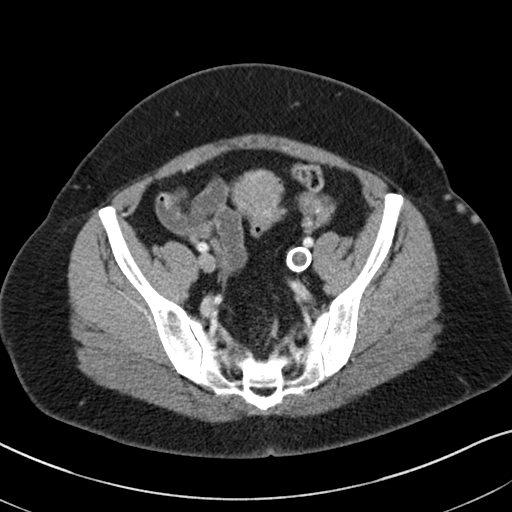
[im 31/87  soft-tissue]
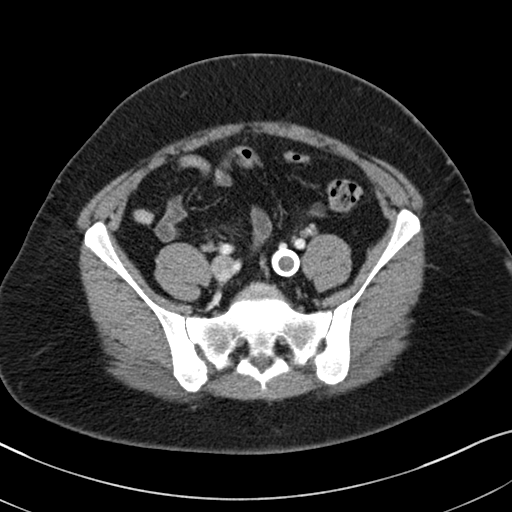
[im 39/87  soft-tissue]
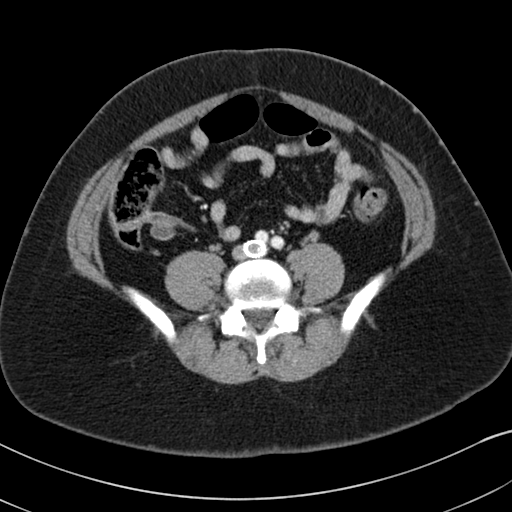
[im 44/87  soft-tissue]
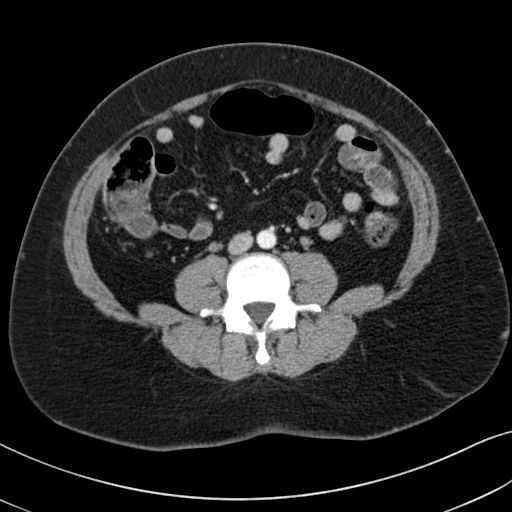
[im 48/87  soft-tissue]
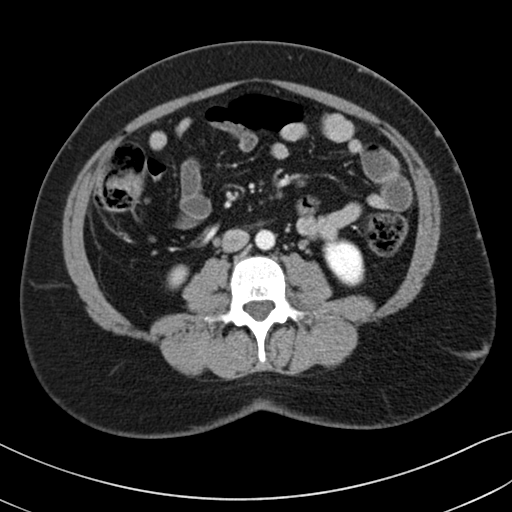
[im 56/87  soft-tissue]
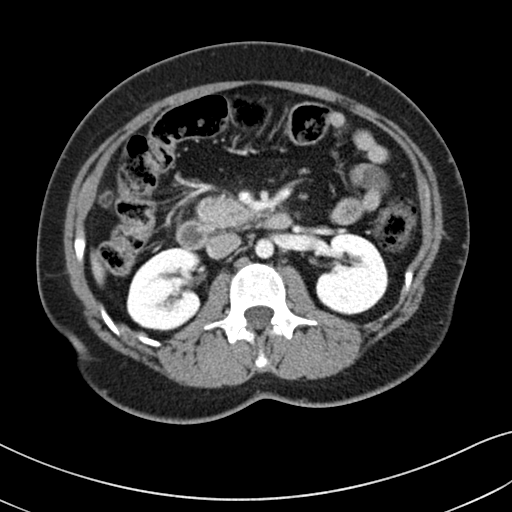
[im 56/87  bone]
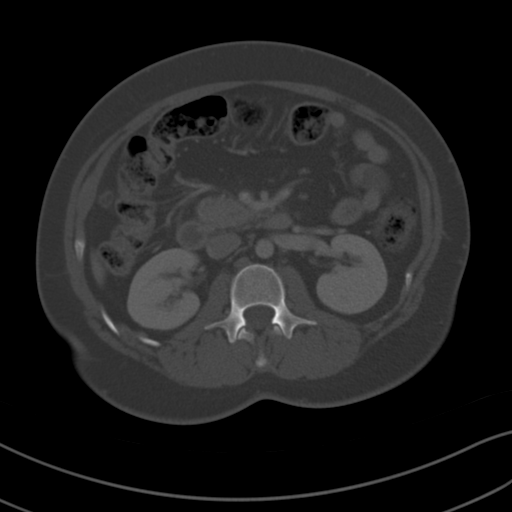
[im 61/87  soft-tissue]
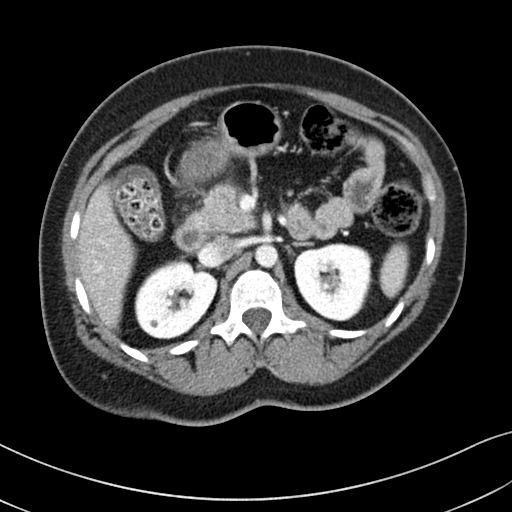
[im 69/87  soft-tissue]
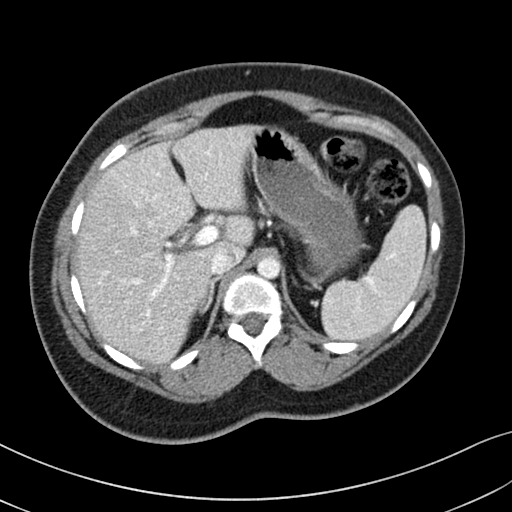
[im 74/87  soft-tissue]
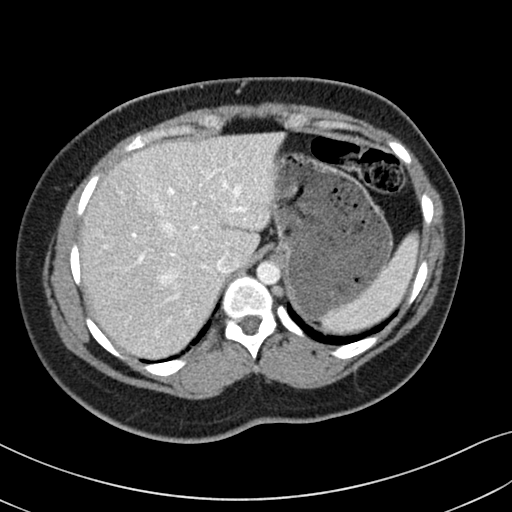
[im 82/87  soft-tissue]
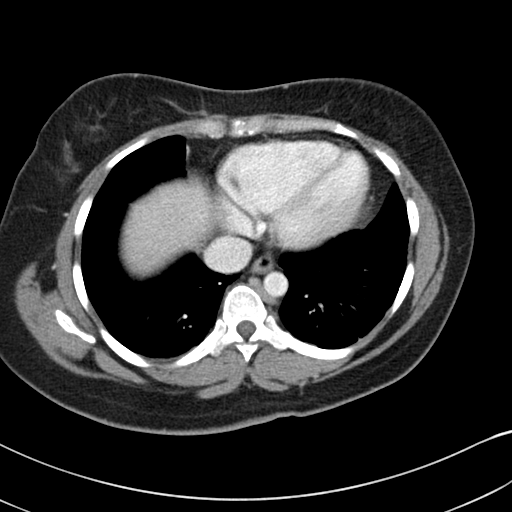

[Series 5: coronal st · coronal · 0.63mm/px · 3 of 140 slices shown]
[im 47/140  soft-tissue]
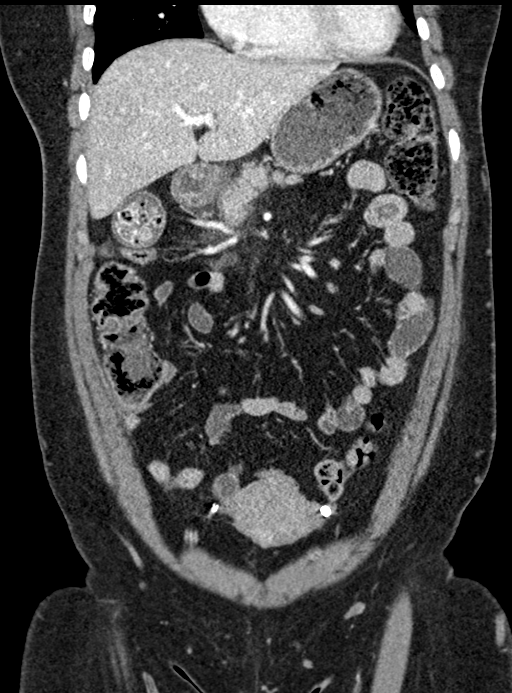
[im 62/140  soft-tissue]
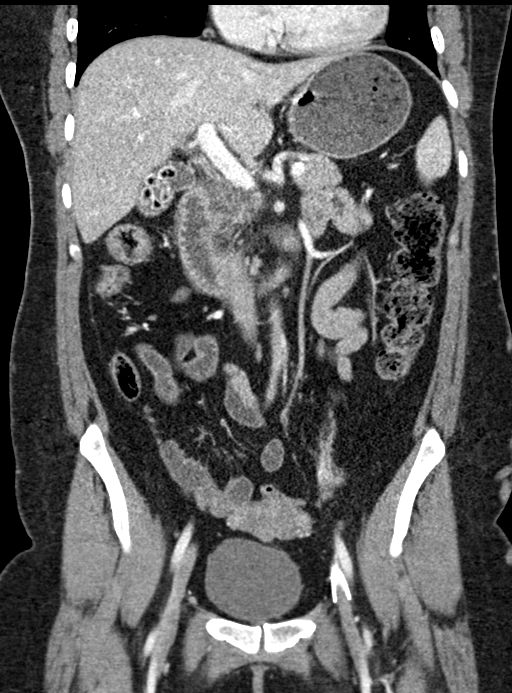
[im 78/140  soft-tissue]
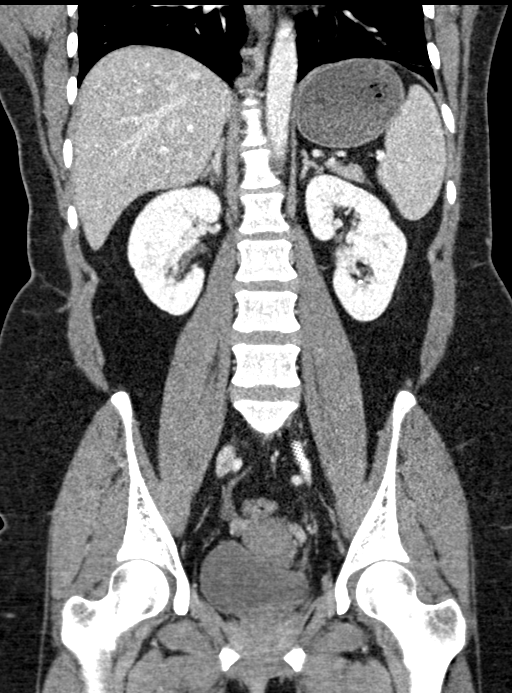

[16 of 46 positions shown; findings below may reference images not displayed]

FINDINGS: Lower chest: Mild scarring in the bases bilaterally. No focal
infiltrate or sizable effusion is seen.

Hepatobiliary: Fatty infiltration of the liver is noted. The
gallbladder is again well visualized with multiple faceted
gallstones within. No biliary ductal dilatation is seen.

Pancreas: Mild peripancreatic inflammatory changes are noted in the
region of the head and uncinate process consistent with focal
pancreatitis.

Spleen: Normal in size without focal abnormality.

Adrenals/Urinary Tract: Adrenal glands are within normal limits.
Kidneys demonstrate a normal enhancement pattern. Few scattered
small cysts are noted. Scattered small renal calculi are noted as
well similar to that seen on the prior exam. The largest of these
lies in the upper pole of the right kidney measuring 2 mm. Bladder
is within normal limits.

Stomach/Bowel: The appendix is within normal limits. No obstructive
or inflammatory changes of the colon are seen. Small bowel and
stomach are unremarkable.

Vascular/Lymphatic: No significant vascular findings are present. No
enlarged abdominal or pelvic lymph nodes. Left iliac venous stent is
noted.

Reproductive: Uterus and bilateral adnexa are unremarkable. Changes
of prior tubal ligation are seen

Other: No abdominal wall hernia or abnormality. No abdominopelvic
ascites.

Musculoskeletal: No acute or significant osseous findings.
IMPRESSION: Changes of mild acute pancreatitis surrounding pancreatic head and
uncinate process.

Cholelithiasis without complicating factors.

Bilateral small renal calculi without obstructive change.

## 2020-02-02 MED ORDER — ONDANSETRON HCL 4 MG/2ML IJ SOLN
4.0000 mg | Freq: Four times a day (QID) | INTRAMUSCULAR | Status: DC | PRN
  Administered 2020-02-03: 4 mg via INTRAVENOUS
  Filled 2020-02-02: qty 2

## 2020-02-02 MED ORDER — SODIUM CHLORIDE 0.9 % IV SOLN: INTRAVENOUS | Status: DC

## 2020-02-02 MED ORDER — ACETAMINOPHEN 650 MG RE SUPP: 650.0000 mg | Freq: Four times a day (QID) | RECTAL | Status: DC | PRN

## 2020-02-02 MED ORDER — FENTANYL CITRATE (PF) 100 MCG/2ML IJ SOLN
50.0000 ug | Freq: Once | INTRAMUSCULAR | Status: AC
  Administered 2020-02-02: 50 ug via INTRAVENOUS
  Filled 2020-02-02: qty 2

## 2020-02-02 MED ORDER — IOHEXOL 300 MG/ML  SOLN
100.0000 mL | Freq: Once | INTRAMUSCULAR | Status: AC | PRN
  Administered 2020-02-02: 100 mL via INTRAVENOUS

## 2020-02-02 MED ORDER — MORPHINE SULFATE (PF) 2 MG/ML IV SOLN: 2.0000 mg | INTRAVENOUS | Status: DC | PRN

## 2020-02-02 MED ORDER — SODIUM CHLORIDE 0.9% FLUSH: 3.0000 mL | Freq: Once | INTRAVENOUS | Status: DC

## 2020-02-02 MED ORDER — LACTATED RINGERS IV BOLUS
1000.0000 mL | Freq: Once | INTRAVENOUS | Status: AC
  Administered 2020-02-02: 1000 mL via INTRAVENOUS

## 2020-02-02 MED ORDER — FENTANYL CITRATE (PF) 100 MCG/2ML IJ SOLN
50.0000 ug | INTRAMUSCULAR | Status: DC | PRN
  Administered 2020-02-02 – 2020-02-03 (×2): 50 ug via INTRAVENOUS
  Filled 2020-02-02 (×2): qty 2

## 2020-02-02 MED ORDER — ENOXAPARIN SODIUM 40 MG/0.4ML ~~LOC~~ SOLN
40.0000 mg | SUBCUTANEOUS | Status: DC
  Administered 2020-02-02 – 2020-02-03 (×2): 40 mg via SUBCUTANEOUS
  Filled 2020-02-02 (×2): qty 0.4

## 2020-02-02 MED ORDER — ACETAMINOPHEN 325 MG PO TABS
650.0000 mg | ORAL_TABLET | Freq: Four times a day (QID) | ORAL | Status: DC | PRN
  Administered 2020-02-02 – 2020-02-03 (×2): 650 mg via ORAL
  Filled 2020-02-02 (×2): qty 2

## 2020-02-02 MED ORDER — ONDANSETRON HCL 4 MG PO TABS: 4.0000 mg | ORAL_TABLET | Freq: Four times a day (QID) | ORAL | Status: DC | PRN

## 2020-02-02 MED ORDER — ONDANSETRON HCL 4 MG/2ML IJ SOLN
4.0000 mg | Freq: Once | INTRAMUSCULAR | Status: AC
  Administered 2020-02-02: 4 mg via INTRAVENOUS
  Filled 2020-02-02: qty 2

## 2020-02-02 NOTE — Patient Instructions (Signed)
DUE TO COVID-19 ONLY ONE VISITOR IS ALLOWED TO COME WITH YOU AND STAY IN THE WAITING ROOM ONLY DURING PRE OP AND PROCEDURE DAY OF SURGERY. THE 1 VISITOR MAY VISIT WITH YOU AFTER SURGERY IN YOUR PRIVATE ROOM DURING VISITING HOURS ONLY!  YOU NEED TO HAVE A COVID 19 TEST ON 02-06-20 @_______ , THIS TEST MUST BE DONE BEFORE SURGERY, COME  [address] [place] , [address].  ([place]) ONCE YOUR COVID TEST IS COMPLETED, PLEASE BEGIN THE QUARANTINE INSTRUCTIONS AS OUTLINED IN YOUR HANDOUT.                Paula Warner  02/02/2020   Your procedure is scheduled on: 02-10-20   Report to [facility] Main  Entrance    Report to Admitting at  8:00 AM     Call this number if you have problems the morning of surgery [phone]    Remember: Do not eat food or drink liquids :After Midnight.     Take these medicines the morning of surgery with A SIP OF WATER: None  BRUSH YOUR TEETH MORNING OF SURGERY AND RINSE YOUR MOUTH OUT, NO CHEWING GUM CANDY OR MINTS.                              You may not have any metal on your body including hair pins and              piercings     Do not wear jewelry, make-up, lotions, powders or perfumes, deodorant              Do not wear nail polish on your fingernails.  Do not shave  48 hours prior to surgery.                Do not bring valuables to the hospital. [place].  Contacts, dentures or bridgework may not be worn into surgery.    Patients discharged the day of surgery will not be allowed to drive home. IF YOU ARE HAVING SURGERY AND GOING HOME THE SAME DAY, YOU MUST HAVE AN ADULT TO DRIVE YOU HOME AND BE WITH YOU FOR 24 HOURS. YOU MAY GO HOME BY TAXI OR UBER OR ORTHERWISE, BUT AN ADULT MUST ACCOMPANY YOU HOME AND STAY WITH YOU FOR 24 HOURS.  Name and phone number of your driver:  Special Instructions: N/A              Please read over the following fact sheets you were  given: _____________________________________________________________________             [facility] - Preparing for Surgery Before surgery, you can play an important role.  Because skin is not sterile, your skin needs to be as free of germs as possible.  You can reduce the number of germs on your skin by washing with CHG (chlorahexidine gluconate) soap before surgery.  CHG is an antiseptic cleaner which kills germs and bonds with the skin to continue killing germs even after washing. Please DO NOT use if you have an allergy to CHG or antibacterial soaps.  If your skin becomes reddened/irritated stop using the CHG and inform your nurse when you arrive at Short Stay. Do not shave (including legs and underarms) for at least 48 hours prior to the first CHG shower.  You may shave your face/neck. Please follow these instructions carefully:  1.  Shower with CHG Soap the night before surgery and the  morning of Surgery.  2.  If you choose to wash your hair, wash your hair first as usual with your  normal  shampoo.  3.  After you shampoo, rinse your hair and body thoroughly to remove the  shampoo.                           4.  Use CHG as you would any other liquid soap.  You can apply chg directly  to the skin and wash                       Gently with a scrungie or clean washcloth.  5.  Apply the CHG Soap to your body ONLY FROM THE NECK DOWN.   Do not use on face/ open                           Wound or open sores. Avoid contact with eyes, ears mouth and genitals (private parts).                       Wash face,  Genitals (private parts) with your normal soap.             6.  Wash thoroughly, paying special attention to the area where your surgery  will be performed.  7.  Thoroughly rinse your body with warm water from the neck down.  8.  DO NOT shower/wash with your normal soap after using and rinsing off  the CHG Soap.                9.  Pat yourself dry with a clean towel.            10.  Wear  clean pajamas.            11.  Place clean sheets on your bed the night of your first shower and do not  sleep with pets. Day of Surgery : Do not apply any lotions/deodorants the morning of surgery.  Please wear clean clothes to the hospital/surgery center.  FAILURE TO FOLLOW THESE INSTRUCTIONS MAY RESULT IN THE CANCELLATION OF YOUR SURGERY PATIENT SIGNATURE_________________________________  NURSE SIGNATURE__________________________________  ________________________________________________________________________

## 2020-02-02 NOTE — ED Provider Notes (Signed)
[facility] DEPT Provider Note   CSN: [id number] Arrival date & time: 02/02/20  1511     History Chief Complaint  Patient presents with  . Abdominal Pain  . Nausea  . Emesis    Paula Warner is a 34 y.o. 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  She has been seen multiple times recently for gallstones with transaminitis and elevated bilirubins.  She has a cholecystectomy scheduled for next week.  She reports that today at work she had worsening of her pain.  It is on the middle of her abdomen.  Radiates into her back.  She reports nausea with 2 or 3 episodes of vomiting.  She states that currently her pain is a 10 out of 10 in the middle abdomen.  She last had water about 2 hours ago, last attempted to eat at about 11.    She has not yet seen GI in the office.  No aggravating or alleviating symptoms noted.  HPI     Past Medical History:  Diagnosis Date  . Asthma   . History of asthma    no current med.  Marland Kitchen 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  . History of kidney stones   . Menstrual periods irregular     Patient Active Problem List   Diagnosis Date Noted  . 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 ESRD (end stage renal disease) ([place]) 07/11/2016  . DVT (deep venous thrombosis) ([place]) 07/11/2016  . DVT of lower extremity (deep venous thrombosis) ([place]) 01/24/2016  . High risk sexual behavior 08/04/2015    Past Surgical History:  Procedure Laterality Date  . CESAREAN SECTION     x 2  . LAPAROSCOPIC TUBAL LIGATION Bilateral 05/08/2017   Procedure: LAPAROSCOPIC TUBAL LIGATION - FILSHIE CLIPS;  Surgeon: Emily Filbert, MD;  Location: [facility];  Service: Gynecology;  Laterality: Bilateral;  . LOWER EXTREMITY ANGIOGRAM Left 07/11/2016   Procedure: Percutaneous Venous Thrombectomy with IVUS and   with Stent Placement;  Surgeon: Serafina Mitchell, MD;  Location: [facility];  Service: Vascular;  Laterality: Left;     OB History    Gravida  3   Para  1   Term      Preterm      AB  1   Living  2     SAB      TAB  1   Ectopic      Multiple  0   Live Births  2           Family History  Problem Relation Age of Onset  . Heart murmur Mother   . Asthma Mother   . Clotting disorder Father   . Heart disease Father     Social History   Tobacco Use  . Smoking status: Never Smoker  . Smokeless tobacco: Never Used  Substance Use Topics  . Alcohol use: No    Alcohol/week: 0.0 standard drinks  . Drug use: No    Home Medications Prior to Admission medications   Medication Sig Start Date End Date Taking? Authorizing Provider  bismuth subsalicylate (PEPTO BISMOL) 262 MG/15ML suspension Take 30 mLs by mouth every 6 (six) hours as needed for indigestion or diarrhea or loose stools.   Yes [provider]  ibuprofen (ADVIL) 200 MG tablet Take 400 mg by mouth every 6 (six)  hours as needed for headache or moderate pain.   Yes [provider]  methocarbamol (ROBAXIN) 500 MG tablet Take 1 tablet (500 mg total) by mouth at bedtime as needed for muscle spasms. 01/09/20  Yes Caccavale, Sophia, PA-C  HYDROcodone-acetaminophen (NORCO/VICODIN) 5-325 MG tablet Take 1-2 tablets by mouth every 6 (six) hours as needed for severe pain. Patient not taking: Reported on 01/11/2020 11/22/19   Petrucelli, Glynda Jaeger, PA-C  ibuprofen (ADVIL) 600 MG tablet Take 1 tablet (600 mg total) by mouth every 8 (eight) hours as needed for moderate pain. Patient not taking: Reported on 01/11/2020 11/22/19   Petrucelli, Samantha R, PA-C  ondansetron (ZOFRAN ODT) 4 MG disintegrating tablet Take 1 tablet (4 mg total) by mouth every 8 (eight) hours as needed for nausea or vomiting. Patient not taking: Reported on 01/11/2020 11/22/19   Petrucelli, Glynda Jaeger, PA-C  tamsulosin (FLOMAX) 0.4 MG CAPS capsule  Take 1 capsule (0.4 mg total) by mouth daily after supper. Patient not taking: Reported on 01/11/2020 11/22/19   Petrucelli, Glynda Jaeger, PA-C  medroxyPROGESTERone (PROVERA) 10 MG tablet Take 1 tablet (10 mg total) by mouth daily. Use for ten days Patient not taking: Reported on 04/06/2019 10/28/18 11/22/19  Elvera Maria, CNM    Allergies    Zinacef [cefuroxime]  Review of Systems   Review of Systems  Constitutional: Negative for chills and fever.  Respiratory: Negative for chest tightness and shortness of breath.   Gastrointestinal: Positive for abdominal pain, nausea and vomiting. Negative for constipation and diarrhea.  Musculoskeletal: Positive for back pain.  All other systems reviewed and are negative.   Physical Exam Updated Vital Signs BP 103/71   Pulse 62   Temp 98.9 F (37.2 C) (Oral)   Resp 16   SpO2 99%   Physical Exam Vitals and nursing note reviewed.  Constitutional:      General: She is not in acute distress.    Appearance: She is well-developed. She is not diaphoretic.  HENT:     Head: Normocephalic and atraumatic.  Eyes:     General: No scleral icterus.       Right eye: No discharge.        Left eye: No discharge.     Conjunctiva/sclera: Conjunctivae normal.  Cardiovascular:     Rate and Rhythm: Normal rate and regular rhythm.     Heart sounds: Normal heart sounds.  Pulmonary:     Effort: Pulmonary effort is normal. No respiratory distress.     Breath sounds: No stridor.  Abdominal:     General: Bowel sounds are normal. There is no distension.     Palpations: Abdomen is soft.     Tenderness: There is abdominal tenderness in the right upper quadrant and epigastric area.  Musculoskeletal:        General: No deformity.     Cervical back: Normal range of motion.  Skin:    General: Skin is warm and dry.  Neurological:     General: No focal deficit present.     Mental Status: She is alert.     Motor: No abnormal muscle tone.  Psychiatric:         Behavior: Behavior normal.     ED Results / Procedures / Treatments   Labs (all labs ordered are listed, but only abnormal results are displayed) Labs Reviewed  LIPASE, BLOOD - Abnormal; Notable for the following components:      Result Value   Lipase 2,172 (*)    All  other components within normal limits  COMPREHENSIVE METABOLIC PANEL - Abnormal; Notable for the following components:   Glucose, Bld 113 (*)    AST 453 (*)    ALT 385 (*)    Alkaline Phosphatase 139 (*)    Total Bilirubin 3.3 (*)    All other components within normal limits  URINALYSIS, ROUTINE W REFLEX MICROSCOPIC - Abnormal; Notable for the following components:   Color, Urine AMBER (*)    APPearance HAZY (*)    All other components within normal limits  SARS CORONAVIRUS 2 BY RT PCR (HOSPITAL ORDER, [facility])  CBC  PREGNANCY, URINE    EKG None  Radiology No results found.  Procedures Procedures (including critical care time)  Medications Ordered in ED Medications  sodium chloride flush (NS) 0.9 % injection 3 mL (has no administration in time range)  ondansetron (ZOFRAN) injection 4 mg (has no administration in time range)  fentaNYL (SUBLIMAZE) injection 50 mcg (has no administration in time range)  lactated ringers bolus 1,000 mL (has no administration in time range)  iohexol (OMNIPAQUE) 300 MG/ML solution 100 mL (has no administration in time range)    ED Course  I have reviewed the triage vital signs and the nursing notes.  Pertinent labs & imaging results that were available during my care of the patient were reviewed by me and considered in my medical decision making (see chart for details).  Clinical Course as of Feb 02 2039  Tue Feb 01, 6350  [id number] 34 year old female with known gallstones and anticipated for surgery here now with worsening abdominal pain nausea vomiting.  She is nontoxic-appearing.  Labs showing elevations LFTs which have been seen before but no  elevation in lipase.  Will consult GI.  Anticipate admission.   [MB]  2018 I spoke with Dr. Alessandra Bevels of [facility].  He states that currently his recommendation is for obtaining a CT scan with conservative treatment for pancreatitis and surgical consult.   [EH]    Clinical Course User Index [EH] Lorin Glass, PA-C [MB] Hayden Rasmussen, MD   MDM Rules/Calculators/A&P                     Patient is a 34 year old woman with a history of cholelithiasis who presents today for evaluation of abdominal pain.  She reports her pain suddenly worsened today.  Within the past month she has had extensive work-up including MRCP, CT scan on, and ultrasound. Today she has significant transaminitis, as opposed to 3 weeks ago her AST has increased from 117 to 453, with her ALT changed from 418 to 385.  Her alk phos has gone from 212  to 139, and her total bilirubin has improved from 4.0 to 3.3.  CMP is unremarkable.  Pregnancy test is negative.  Based on significant elevation in her lipase which previously is normal however is now markedly elevated at 2172 by talked with Dr. Alessandra Bevels of [facility] who recommends conservative care for pancreatitis, states they will see patient tomorrow however no indication for ERCP or MRCP currently, recommends CT scan followed by surgical consult.   CT scan is ordered.    At shift change care was transferred to Dr. Melina Copa who will follow pending studies, re-evaulate and determine disposition.     Final Clinical Impression(s) / ED Diagnoses Final diagnoses:  Acute gallstone pancreatitis  Generalized abdominal pain  Transaminitis    Rx / DC Orders ED Discharge Orders  None       Ollen Gross 02/02/20 2040    Hayden Rasmussen, MD 02/03/20 1311

## 2020-02-02 NOTE — ED Notes (Signed)
Report called for pt transfer to the floor  

## 2020-02-02 NOTE — ED Triage Notes (Signed)
Patient here from home with complaints of abd pain, n/v x3 weeks. Scheduled gall bladder removal on 5/19.

## 2020-02-02 NOTE — Progress Notes (Signed)
PCP - None Cardiologist -   Chest x-ray - 01-09-20  EKG - 01-11-20 Stress Test -  ECHO -  Cardiac Cath -   Sleep Study -  CPAP -   Fasting Blood Sugar -  Checks Blood Sugar _____ times a day  Blood Thinner Instructions: Aspirin Instructions: Last Dose:  Anesthesia review:   Patient denies shortness of breath, fever, cough and chest pain at PAT appointment   Patient verbalized understanding of instructions that were given to them at the PAT appointment. Patient was also instructed that they will need to review over the PAT instructions again at home before surgery.

## 2020-02-02 NOTE — H&P (Signed)
History and Physical    [name]:[id number] DOB: 20-Aug-1986 DOA: 02/02/2020  PCP: Patient, No Pcp Per  Patient coming from: Home  I have personally briefly reviewed patient's old medical records in [place]  Chief Complaint: Abd pain  HPI: Paula Warner is a 34 y.o. female with medical history significant of prior DVT.  Pt with known chronic cholecystitis with calculous, recent biliary colic and elevated transaminases.  Surgery saw pt on 5/4, planning for Lap chole on 5/19.  MRCP done just last week without any obvious CBD stones.  Presents to ED today with worsening of abd pain.  Epigastric abdomen, radiates to back, nausea with 2-3 episodes of vomiting.   ED Course: PT now with pancreatitis, lipase 2000+, LFTs in the 400s.  CT showing pancreatitis, no CBD dilation.  GI and gen surg consulted, hospitalist asked to admit.   Review of Systems: As per HPI, otherwise all review of systems negative.  Past Medical History:  Diagnosis Date  . Asthma   . History of asthma    no current med.  Marland Kitchen 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  . History of kidney stones   . Menstrual periods irregular     Past Surgical History:  Procedure Laterality Date  . CESAREAN SECTION     x 2  . LAPAROSCOPIC TUBAL LIGATION Bilateral 05/08/2017   Procedure: LAPAROSCOPIC TUBAL LIGATION - FILSHIE CLIPS;  Surgeon: Emily Filbert, MD;  Location: [facility];  Service: Gynecology;  Laterality: Bilateral;  . LOWER EXTREMITY ANGIOGRAM Left 07/11/2016   Procedure: Percutaneous Venous Thrombectomy with IVUS and  with Stent Placement;  Surgeon: Serafina Mitchell, MD;  Location: [facility] OR;  Service: Vascular;  Laterality: Left;     reports that she has never smoked. She has never used smokeless tobacco. She reports that she does not drink alcohol or use drugs.  Allergies  Allergen Reactions  . Zinacef [Cefuroxime] Hives    Family History  Problem Relation Age of Onset    . Heart murmur Mother   . Asthma Mother   . Clotting disorder Father   . Heart disease Father      Prior to Admission medications   Medication Sig Start Date End Date Taking? Authorizing Provider  bismuth subsalicylate (PEPTO BISMOL) 262 MG/15ML suspension Take 30 mLs by mouth every 6 (six) hours as needed for indigestion or diarrhea or loose stools.   Yes [provider]  ibuprofen (ADVIL) 200 MG tablet Take 400 mg by mouth every 6 (six) hours as needed for headache or moderate pain.   Yes [provider]  methocarbamol (ROBAXIN) 500 MG tablet Take 1 tablet (500 mg total) by mouth at bedtime as needed for muscle spasms. 01/09/20  Yes Caccavale, Sophia, PA-C  medroxyPROGESTERone (PROVERA) 10 MG tablet Take 1 tablet (10 mg total) by mouth daily. Use for ten days Patient not taking: Reported on 04/06/2019 10/28/18 11/22/19  Paula Warner, CNM    Physical Exam: Vitals:   02/02/20 1518 02/02/20 1930 02/02/20 2030  BP: (!) 119/96 103/71 124/77  Pulse: 66 62 77  Resp: 18 16 16   Temp: 98.9 F (37.2 C)    TempSrc: Oral    SpO2: 100% 99% 100%    Constitutional: NAD, calm, comfortable Eyes: PERRL, lids and conjunctivae normal ENMT: Mucous membranes are moist. Posterior pharynx clear of any exudate or lesions.Normal dentition.  Neck: normal, supple, no masses, no thyromegaly Respiratory: clear to auscultation bilaterally, no wheezing,  no crackles. Normal respiratory effort. No accessory muscle use.  Cardiovascular: Regular rate and rhythm, no murmurs / rubs / gallops. No extremity edema. 2+ pedal pulses. No carotid bruits.  Abdomen: Epigastric TTP, no masses palpated. No hepatosplenomegaly. Bowel sounds positive.  Musculoskeletal: no clubbing / cyanosis. No joint deformity upper and lower extremities. Good ROM, no contractures. Normal muscle tone.  Skin: no rashes, lesions, ulcers. No induration Neurologic: CN 2-12 grossly intact. Sensation intact, DTR normal.  Strength 5/5 in all 4.  Psychiatric: Normal judgment and insight. Alert and oriented x 3. Normal mood.    Labs on Admission: I have personally reviewed following labs and imaging studies  CBC: Recent Labs  Lab 02/02/20 1637  WBC 8.9  HGB 14.5  HCT 43.1  MCV 94.5  PLT 532   Basic Metabolic Panel: Recent Labs  Lab 02/02/20 1637  NA 142  K 3.7  CL 105  CO2 29  GLUCOSE 113*  BUN 10  CREATININE 0.64  CALCIUM 9.2   GFR: CrCl cannot be calculated (Unknown ideal weight.). Liver Function Tests: Recent Labs  Lab 02/02/20 1637  AST 453*  ALT 385*  ALKPHOS 139*  BILITOT 3.3*  PROT 7.9  ALBUMIN 4.3   Recent Labs  Lab 02/02/20 1637  LIPASE 2,172*   No results for input(s): AMMONIA in the last 168 hours. Coagulation Profile: No results for input(s): INR, PROTIME in the last 168 hours. Cardiac Enzymes: No results for input(s): CKTOTAL, CKMB, CKMBINDEX, TROPONINI in the last 168 hours. BNP (last 3 results) No results for input(s): PROBNP in the last 8760 hours. HbA1C: No results for input(s): HGBA1C in the last 72 hours. CBG: No results for input(s): GLUCAP in the last 168 hours. Lipid Profile: No results for input(s): CHOL, HDL, LDLCALC, TRIG, CHOLHDL, LDLDIRECT in the last 72 hours. Thyroid Function Tests: No results for input(s): TSH, T4TOTAL, FREET4, T3FREE, THYROIDAB in the last 72 hours. Anemia Panel: No results for input(s): VITAMINB12, FOLATE, FERRITIN, TIBC, IRON, RETICCTPCT in the last 72 hours. Urine analysis:    Component Value Date/Time   COLORURINE AMBER (A) 02/02/2020 1645   APPEARANCEUR HAZY (A) 02/02/2020 1645   LABSPEC 1.017 02/02/2020 1645   PHURINE 7.0 02/02/2020 1645   GLUCOSEU NEGATIVE 02/02/2020 1645   HGBUR NEGATIVE 02/02/2020 1645   BILIRUBINUR NEGATIVE 02/02/2020 1645   BILIRUBINUR negative 02/29/2016 1538   KETONESUR NEGATIVE 02/02/2020 1645   PROTEINUR NEGATIVE 02/02/2020 1645   UROBILINOGEN negative 02/29/2016 1538    UROBILINOGEN 1.0 04/23/2015 1210   NITRITE NEGATIVE 02/02/2020 1645   LEUKOCYTESUR NEGATIVE 02/02/2020 1645    Radiological Exams on Admission: CT Abdomen Pelvis W Contrast  Result Date: 02/02/2020 CLINICAL DATA:  Upper abdominal pain with nausea and vomiting for several weeks, history of cholelithiasis EXAM: CT ABDOMEN AND PELVIS WITH CONTRAST TECHNIQUE: Multidetector CT imaging of the abdomen and pelvis was performed using the standard protocol following bolus administration of intravenous contrast. CONTRAST:  149mL OMNIPAQUE IOHEXOL 300 MG/ML  SOLN COMPARISON:  04/06/2019 FINDINGS: Lower chest: Mild scarring in the bases bilaterally. No focal infiltrate or sizable effusion is seen. Hepatobiliary: Fatty infiltration of the liver is noted. The gallbladder is again well visualized with multiple faceted gallstones within. No biliary ductal dilatation is seen. Pancreas: Mild peripancreatic inflammatory changes are noted in the region of the head and uncinate process consistent with focal pancreatitis. Spleen: Normal in size without focal abnormality. Adrenals/Urinary Tract: Adrenal glands are within normal limits. Kidneys demonstrate a normal enhancement pattern. Few scattered small cysts  are noted. Scattered small renal calculi are noted as well similar to that seen on the prior exam. The largest of these lies in the upper pole of the right kidney measuring 2 mm. Bladder is within normal limits. Stomach/Bowel: The appendix is within normal limits. No obstructive or inflammatory changes of the colon are seen. Small bowel and stomach are unremarkable. Vascular/Lymphatic: No significant vascular findings are present. No enlarged abdominal or pelvic lymph nodes. Left iliac venous stent is noted. Reproductive: Uterus and bilateral adnexa are unremarkable. Changes of prior tubal ligation are seen Other: No abdominal wall hernia or abnormality. No abdominopelvic ascites. Musculoskeletal: No acute or significant  osseous findings. IMPRESSION: Changes of mild acute pancreatitis surrounding pancreatic head and uncinate process. Cholelithiasis without complicating factors. Bilateral small renal calculi without obstructive change. Electronically Signed   By: Inez Catalina M.D.   On: 02/02/2020 21:13    EKG: Independently reviewed.  Assessment/Plan Principal Problem:   Acute gallstone pancreatitis    1. Acute gallstone pancreatitis - 1. Current suspicion is that she passed the stone 2. Repeat CMP in AM 3. surg will see during stay, plan to let pancreatitis improve then do cholecystectomy during inpt admission. 4. GI doesn't think she needs an MRCP or ERCP at this time 5. Will keep NPO 6. Morphine and zofran PRN 7. IVF: NS at 125  DVT prophylaxis: Lovenox + SCDs Code Status: Full Family Communication: No family in room Disposition Plan: Home after pancreatitis improved, and gall bladder is out! Consults called: GI and gen surg Admission status: Admit to inpatient  Severity of Illness: The appropriate patient status for this patient is INPATIENT. Inpatient status is judged to be reasonable and necessary in order to provide the required intensity of service to ensure the patient's safety. The patient's presenting symptoms, physical exam findings, and initial radiographic and laboratory data in the context of their chronic comorbidities is felt to place them at high risk for further clinical deterioration. Furthermore, it is not anticipated that the patient will be medically stable for discharge from the hospital within 2 midnights of admission. The following factors support the patient status of inpatient.   IP status for: 1) Acute gallstone pancreatitis 2) biliary colic / chronic cholecystitis - failed attempt to manage outpt   * I certify that at the point of admission it is my clinical judgment that the patient will require inpatient hospital care spanning beyond 2 midnights from the point of  admission due to high intensity of service, high risk for further deterioration and high frequency of surveillance required.*    Marasia Newhall M. DO [facility]  How to contact the [facility] Attending or Consulting provider [place] or covering provider during after hours [facility] [employer], for this patient?  1. Check the care team in [facility] and look for a) attending/consulting [facility] provider listed and b) the [facility] team listed 2. Log into [email or web address]  Amion Physician Scheduling and messaging for groups and whole hospitals  On call and physician scheduling software for group practices, residents, hospitalists and other medical providers for call, clinic, rotation and shift schedules. OnCall [employer] is a hospital-wide system for scheduling doctors and paging doctors on call. EasyPlot is for scientific plotting and data analysis.  [email or web address]  and use [facility]'s universal password to access. If you do not have the password, please contact the hospital operator.  3. Locate the [facility] provider you are looking for under [facility] and page to a number that you can be  directly reached. 4. If you still have difficulty reaching the provider, please page the [facility] (Director on Call) for the Hospitalists listed on amion for assistance.  02/02/2020, 9:46 PM

## 2020-02-03 ENCOUNTER — Encounter (HOSPITAL_COMMUNITY)
Admission: RE | Admit: 2020-02-03 | Discharge: 2020-02-03 | Disposition: A | Discharge status: Home / Self Care | Payer: 59 | Source: Ambulatory Visit | Attending: Surgery | Admitting: Surgery

## 2020-02-03 ENCOUNTER — Encounter (HOSPITAL_COMMUNITY): Admission: RE | Admit: 2020-02-03 | Payer: 59 | Source: Ambulatory Visit

## 2020-02-03 ENCOUNTER — Other Ambulatory Visit (HOSPITAL_COMMUNITY): Payer: Medicaid Other

## 2020-02-03 LAB — COMPREHENSIVE METABOLIC PANEL
ALT: 363 U/L — ABNORMAL HIGH (ref 0–44)
AST: 300 U/L — ABNORMAL HIGH (ref 15–41)
Albumin: 3.7 g/dL (ref 3.5–5.0)
Alkaline Phosphatase: 135 U/L — ABNORMAL HIGH (ref 38–126)
Anion gap: 9 (ref 5–15)
BUN: 11 mg/dL (ref 6–20)
CO2: 24 mmol/L (ref 22–32)
Calcium: 8.7 mg/dL — ABNORMAL LOW (ref 8.9–10.3)
Chloride: 109 mmol/L (ref 98–111)
Creatinine, Ser: 0.49 mg/dL (ref 0.44–1.00)
GFR calc Af Amer: >60 mL/min (ref >60)
GFR calc non Af Amer: >60 mL/min (ref >60)
Glucose, Bld: 98 mg/dL (ref 70–99)
Potassium: 3.3 mmol/L — ABNORMAL LOW (ref 3.5–5.1)
Sodium: 142 mmol/L (ref 135–145)
Total Bilirubin: 4.6 mg/dL — ABNORMAL HIGH (ref 0.3–1.2)
Total Protein: 7 g/dL (ref 6.5–8.1)

## 2020-02-03 LAB — CBC
HCT: 39.6 % (ref 36.0–46.0)
Hemoglobin: 13.3 g/dL (ref 12.0–15.0)
MCH: 31.1 pg (ref 26.0–34.0)
MCHC: 33.6 g/dL (ref 30.0–36.0)
MCV: 92.7 fL (ref 80.0–100.0)
Platelets: 213 10*3/uL (ref 150–400)
RBC: 4.27 MIL/uL (ref 3.87–5.11)
RDW: 13.1 % (ref 11.5–15.5)
WBC: 7.8 10*3/uL (ref 4.0–10.5)
nRBC: 0 % (ref 0.0–0.2)

## 2020-02-03 LAB — HIV ANTIBODY (ROUTINE TESTING W REFLEX): HIV Screen 4th Generation wRfx: NONREACTIVE

## 2020-02-03 LAB — LIPASE, BLOOD: Lipase: 325 U/L — ABNORMAL HIGH (ref 11–51)

## 2020-02-03 MED ORDER — GABAPENTIN 300 MG PO CAPS
300.0000 mg | ORAL_CAPSULE | ORAL | Status: AC
  Filled 2020-02-03: qty 1

## 2020-02-03 MED ORDER — CELECOXIB 200 MG PO CAPS
200.0000 mg | ORAL_CAPSULE | ORAL | Status: AC
  Filled 2020-02-03: qty 1

## 2020-02-03 MED ORDER — GENTAMICIN SULFATE 40 MG/ML IJ SOLN
320.0000 mg | INTRAVENOUS | Status: AC
  Filled 2020-02-03 (×2): qty 8

## 2020-02-03 MED ORDER — CLINDAMYCIN PHOSPHATE 900 MG/50ML IV SOLN
900.0000 mg | INTRAVENOUS | Status: AC
  Filled 2020-02-03: qty 50

## 2020-02-03 MED ORDER — CHLORHEXIDINE GLUCONATE CLOTH 2 % EX PADS
6.0000 | MEDICATED_PAD | Freq: Once | CUTANEOUS | Status: AC
  Administered 2020-02-03: 6 via TOPICAL

## 2020-02-03 MED ORDER — ACETAMINOPHEN 500 MG PO TABS
1000.0000 mg | ORAL_TABLET | ORAL | Status: AC
  Filled 2020-02-03: qty 2

## 2020-02-03 MED ORDER — BUPIVACAINE LIPOSOME 1.3 % IJ SUSP
20.0000 mL | INTRAMUSCULAR | Status: AC
  Filled 2020-02-03: qty 20

## 2020-02-03 MED ORDER — POTASSIUM CHLORIDE 10 MEQ/100ML IV SOLN
10.0000 meq | Freq: Once | INTRAVENOUS | Status: AC
  Administered 2020-02-03: 10 meq via INTRAVENOUS
  Filled 2020-02-03: qty 100

## 2020-02-03 MED ORDER — CHLORHEXIDINE GLUCONATE CLOTH 2 % EX PADS: 6.0000 | MEDICATED_PAD | Freq: Once | CUTANEOUS | Status: DC

## 2020-02-03 MED ORDER — POTASSIUM CHLORIDE 10 MEQ/100ML IV SOLN
10.0000 meq | INTRAVENOUS | Status: AC
  Administered 2020-02-03 (×3): 10 meq via INTRAVENOUS
  Filled 2020-02-03 (×3): qty 100

## 2020-02-03 MED ORDER — ENSURE PRE-SURGERY PO LIQD
296.0000 mL | Freq: Once | ORAL | Status: AC
  Administered 2020-02-04: 296 mL via ORAL
  Filled 2020-02-03: qty 296

## 2020-02-03 NOTE — Progress Notes (Signed)
PROGRESS NOTE  Paula Warner JXB:[id number] DOB: May 31, 1986 DOA: 02/02/2020 PCP: Patient, No Pcp Per  Brief History   Paula Warner is a 34 y.o. female with medical history significant of prior DVT.  Pt with known chronic cholecystitis with calculous, recent biliary colic and elevated transaminases.  Surgery saw pt on 5/4, planning for Lap chole on 5/19.  MRCP done just last week without any obvious CBD stones.  Presents to ED today with worsening of abd pain.  Epigastric abdomen, radiates to back, nausea with 2-3 episodes of vomiting.  The patient has been admitted to a medical bed. She is on a clear liquid diet wth pain control and antiemetics.Gastroenterology and General Surgery have been consulted. GI has decided to take a wait and see approach before proceeding with MRCP. They recommend to proceed if her LFT's are increased tomorrow. General surgery plans for lap chole with IOC once pancreatitis has resolved.  Consultants  . Gastroenterology . General Surgery  Procedures  . None  Antibiotics   Anti-infectives (From admission, onward)   None    .  Interval History/Subjective  The patient states that she is feeling a little better.  Objective   Vitals:  Vitals:   02/02/20 2030 02/03/20 0635  BP: 124/77 117/68  Pulse: 77 78  Resp: 16 17  Temp:  98.7 F (37.1 C)  SpO2: 100% 96%    Exam:  Constitutional:  . Appears calm and comfortable Respiratory:  . CTA bilaterally, no w/r/r.  . Respiratory effort normal. No retractions or accessory muscle use Cardiovascular:  . RRR, no m/r/g . No LE extremity edema   . Normal pedal pulses Abdomen:  . Abdomen is slightly distended and mildly tender. . No hernias, masses or organomegaly is appreciated. Marland Kitchen Hypoactive bowel sounds. Musculoskeletal:  . No cyanosis, clubbing, or edema Skin:  . No rashes, lesions, ulcers . palpation of skin: no induration or nodules Neurologic:  . CN 2-12 intact . Sensation all 4  extremities intact Psychiatric:  . Mental status o Mood, affect appropriate o Orientation to person, place, time  . judgment and insight appear intact   I have personally reviewed the following:   Today's Data  . Vitals, CMP, CBC  Imaging  . CT abdomen and pelvis: Cholelithiasis, mild acute pancreatitis with surrounding pancreatic head and uncinate process. There are small bilateral renal calculi without obstructive change.  Scheduled Meds: . enoxaparin (LOVENOX) injection  40 mg Subcutaneous Q24H  . sodium chloride flush  3 mL Intravenous Once   Continuous Infusions: . sodium chloride 125 mL/hr at 02/03/20 0719  . potassium chloride 10 mEq (02/03/20 1313)    Principal Problem:   Acute gallstone pancreatitis   LOS: 1 day   A & P   Acute gallstone pancreatitis Mild hypokalemia    Acute gallstone pancreatitis: Gastroenterology and General surgery is pending. GI has decided to take a wait and see approach before proceeding with MRCP. They recommend to proceed if her LFT's are increased tomorrow. General surgery plans for lap chole with IOC once pancreatitis has resolved. Lipase is trending down as are LFT's. The patient states that her pain is improved. She has been advanced to clear liquid diet.  Mild hypokalemia: Supplement and follow.   DVT prophylaxis: Lovenox + SCDs Code Status: Full Family Communication: No family in room Disposition Plan: Home after pancreatitis improved, and gall bladder is out!  [name], DO [facility] Direct contact: see [email or web address]  7PM-7AM contact night coverage as above 02/03/2020,  2:47 PM  LOS: 1 day

## 2020-02-03 NOTE — Consult Note (Signed)
[facility] Surgery Consult Note  Paula Warner 01-26-1986  [id number].    Requesting MD: Jennette Kettle Chief Complaint/Reason for Consult: gallstone pancreatitis   HPI:  Paula Warner is a 34yo female who was admitted to [facility] last night with gallstone pancreatitis. Patient has known symptomatic gallstones h/o elevated LFTs and was scheduled for elective laparoscopic cholecystectomy 02/10/20 with Dr. Georgette Dover. MRCP 4/19 negative for choledocholithiasis. States that her pain became acutely worse yesterday. Pain is epigastric/RUQ and radiates into her back. Associated with multiple episodes of nausea and vomiting. Denies fever, chills, diarrhea.  ED workup included CT scan which shows changes of mild acute pancreatitis surrounding pancreatic head and uncinate process, cholelithiasis without complicating factors, and bilateral small renal calculi without obstructive change. WBC 8.9, AST 453, ALT 385, Alk phos 139, Tbili 3.3, lipase 2172. Tbili up to 4.6 this AM, otherwise LFTs downtrending. Lipase down to 325.  General surgery asked to see.  PMH significant for h/o DVT while on OCP - no longer on anticoagulation Abdominal surgical history: c section, tubal ligation Nonsmoker Employment: daycare  Review of Systems  Constitutional: Negative.   HENT: Negative.   Eyes: Negative.   Respiratory: Negative.   Cardiovascular: Negative.   Gastrointestinal: Positive for abdominal pain, nausea and vomiting. Negative for diarrhea.  Genitourinary: Negative.   Musculoskeletal: Negative.   Skin: Negative.   Neurological: Negative.     All systems reviewed and otherwise negative except for as above  Family History  Problem Relation Age of Onset  . Heart murmur Mother   . Asthma Mother   . Clotting disorder Father   . Heart disease Father     Past Medical History:  Diagnosis Date  . Asthma   . History of asthma    no current med.  Marland Kitchen 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  .  History of kidney stones   . Menstrual periods irregular     Past Surgical History:  Procedure Laterality Date  . CESAREAN SECTION     x 2  . LAPAROSCOPIC TUBAL LIGATION Bilateral 05/08/2017   Procedure: LAPAROSCOPIC TUBAL LIGATION - FILSHIE CLIPS;  Surgeon: Emily Filbert, MD;  [facility]: [facility];  Service: Gynecology;  Laterality: Bilateral;  . LOWER EXTREMITY ANGIOGRAM Left 07/11/2016   Procedure: Percutaneous Venous Thrombectomy with IVUS and  with Stent Placement;  Surgeon: Serafina Mitchell, MD;  Location: [facility] OR;  Service: Vascular;  Laterality: Left;    Social History:  reports that she has never smoked. She has never used smokeless tobacco. She reports that she does not drink alcohol or use drugs.  Allergies:  Allergies  Allergen Reactions  . Zinacef [Cefuroxime] Hives    Medications Prior to Admission  Medication Sig Dispense Refill  . bismuth subsalicylate (PEPTO BISMOL) 262 MG/15ML suspension Take 30 mLs by mouth every 6 (six) hours as needed for indigestion or diarrhea or loose stools.    Marland Kitchen ibuprofen (ADVIL) 200 MG tablet Take 400 mg by mouth every 6 (six) hours as needed for headache or moderate pain.    . methocarbamol (ROBAXIN) 500 MG tablet Take 1 tablet (500 mg total) by mouth at bedtime as needed for muscle spasms. 10 tablet 0    Prior to Admission medications   Medication Sig Start Date End Date Taking? Authorizing Provider  bismuth subsalicylate (PEPTO BISMOL) 262 MG/15ML suspension Take 30 mLs by mouth every 6 (six) hours as needed for indigestion or diarrhea or loose stools.   Yes [provider]  ibuprofen (ADVIL) 200 MG tablet Take 400 mg by mouth every 6 (six) hours as needed for headache or moderate pain.   Yes [provider]  methocarbamol (ROBAXIN) 500 MG tablet Take 1 tablet (500 mg total) by mouth at bedtime as needed for muscle spasms. 01/09/20  Yes Caccavale, Sophia, PA-C  medroxyPROGESTERone (PROVERA) 10 MG tablet Take 1  tablet (10 mg total) by mouth daily. Use for ten days Patient not taking: Reported on 04/06/2019 10/28/18 11/22/19  Elvera Maria, CNM    Blood pressure 117/68, pulse 78, temperature 98.7 F (37.1 C), temperature source Oral, resp. rate 17, height '5\' 1"'  (1.549 m), weight 70.7 kg, SpO2 96 %. Physical Exam: General: pleasant, WD/WN black female who is laying in bed in NAD HEENT: head is normocephalic, atraumatic.  Sclera are noninjected.  PERRL.  Ears and nose without any masses or lesions.  Mouth is pink and moist. Dentition fair Heart: regular, rate, and rhythm.  Normal s1,s2. No obvious murmurs, gallops, or rubs noted.  Palpable pedal pulses bilaterally  Lungs: CTAB, no wheezes, rhonchi, or rales noted.  Respiratory effort nonlabored Abd: soft, ND, +BS, no masses, hernias, or organomegaly, mild epigastric/RUQ TTP without rebound or guarding MS: no BUE/BLE edema, calves soft and nontender Skin: warm and dry with no masses, lesions, or rashes Psych: A&Ox4 with an appropriate affect Neuro: cranial nerves grossly intact, equal strength in BUE/BLE bilaterally, normal speech, thought process intact  Results for orders placed or performed during the hospital encounter of 02/02/20 (from the past 48 hour(s))  Lipase, blood     Status: Abnormal   Collection Time: 02/02/20  4:37 PM  Result Value Ref Range   Lipase 2,172 (H) 11 - 51 U/L    Comment: RESULTS CONFIRMED BY MANUAL DILUTION Performed at [facility], [place] [address] [place], [place] [address]   Comprehensive metabolic panel     Status: Abnormal   Collection Time: 02/02/20  4:37 PM  Result Value Ref Range   Sodium 142 135 - 145 mmol/L   Potassium 3.7 3.5 - 5.1 mmol/L   Chloride 105 98 - 111 mmol/L   CO2 29 22 - 32 mmol/L   Glucose, Bld 113 (H) 70 - 99 mg/dL    Comment: Glucose reference range applies only to samples taken after fasting for at least 8 hours.   BUN 10 6 - 20 mg/dL   Creatinine, Ser 0.64 0.44 -  1.00 mg/dL   Calcium 9.2 8.9 - 10.3 mg/dL   Total Protein 7.9 6.5 - 8.1 g/dL   Albumin 4.3 3.5 - 5.0 g/dL   AST 453 (H) 15 - 41 U/L   ALT 385 (H) 0 - 44 U/L   Alkaline Phosphatase 139 (H) 38 - 126 U/L   Total Bilirubin 3.3 (H) 0.3 - 1.2 mg/dL   GFR calc non Af Amer >60 >60 mL/min   GFR calc Af Amer >60 >60 mL/min   Anion gap 8 5 - 15    Comment: Performed at [facility], [place] [address], [place], [place] [address]  CBC     Status: None   Collection Time: 02/02/20  4:37 PM  Result Value Ref Range   WBC 8.9 4.0 - 10.5 K/uL   RBC 4.56 3.87 - 5.11 MIL/uL   Hemoglobin 14.5 12.0 - 15.0 g/dL   HCT 43.1 36.0 - 46.0 %   MCV 94.5 80.0 - 100.0 fL   MCH 31.8 26.0 - 34.0 pg  MCHC 33.6 30.0 - 36.0 g/dL   RDW 12.9 11.5 - 15.5 %   Platelets 236 150 - 400 K/uL   nRBC 0.0 0.0 - 0.2 %    Comment: Performed at [facility], [place] [address], [place], [place] [address]  Urinalysis, Routine w reflex microscopic     Status: Abnormal   Collection Time: 02/02/20  4:45 PM  Result Value Ref Range   Color, Urine AMBER (A) YELLOW    Comment: BIOCHEMICALS MAY BE AFFECTED BY COLOR   APPearance HAZY (A) CLEAR   Specific Gravity, Urine 1.017 1.005 - 1.030   pH 7.0 5.0 - 8.0   Glucose, UA NEGATIVE NEGATIVE mg/dL   Hgb urine dipstick NEGATIVE NEGATIVE   Bilirubin Urine NEGATIVE NEGATIVE   Ketones, ur NEGATIVE NEGATIVE mg/dL   Protein, ur NEGATIVE NEGATIVE mg/dL   Nitrite NEGATIVE NEGATIVE   Leukocytes,Ua NEGATIVE NEGATIVE    Comment: Performed at [place] [address] [place], [place] [address]  Pregnancy, urine     Status: None   Collection Time: 02/02/20  4:45 PM  Result Value Ref Range   Preg Test, Ur NEGATIVE NEGATIVE    Comment:        THE SENSITIVITY OF THIS METHODOLOGY IS >20 mIU/mL. Performed at [facility], [place] [address], [place], [place] [address]   SARS Coronavirus 2 by RT PCR (hospital order, performed in  [facility] hospital lab) Nasopharyngeal Nasopharyngeal Swab     Status: None   Collection Time: 02/02/20  7:42 PM   Specimen: Nasopharyngeal Swab  Result Value Ref Range   SARS Coronavirus 2 NEGATIVE NEGATIVE    Comment: (NOTE) SARS-CoV-2 target nucleic acids are NOT DETECTED. The SARS-CoV-2 RNA is generally detectable in upper and lower respiratory specimens during the acute phase of infection. The lowest concentration of SARS-CoV-2 viral copies this assay can detect is 250 copies / mL. A negative result does not preclude SARS-CoV-2 infection and should not be used as the sole basis for treatment or other patient management decisions.  A negative result may occur with improper specimen collection / handling, submission of specimen other than nasopharyngeal swab, presence of viral mutation(s) within the areas targeted by this assay, and inadequate number of viral copies (<250 copies / mL). A negative result must be combined with clinical observations, patient history, and epidemiological information. Fact Sheet for Patients:   StrictlyIdeas.no Fact Sheet for Healthcare Providers: BankingDealers.co.za This test is not yet approved or cleared  by the [place] FDA and has been authorized for detection and/or diagnosis of SARS-CoV-2 by FDA under an Emergency Use Authorization (EUA).  This EUA will remain in effect (meaning this test can be used) for the duration of the COVID-19 declaration under Section 564(b)(1) of the Act, 21 U.S.C. section 360bbb-3(b)(1), unless the authorization is terminated or revoked sooner. Performed at [facility], [place] [address] [place],  [address]   CBC     Status: None   Collection Time: 02/03/20  4:20 AM  Result Value Ref Range   WBC 7.8 4.0 - 10.5 K/uL   RBC 4.27 3.87 - 5.11 MIL/uL   Hemoglobin 13.3 12.0 - 15.0 g/dL   HCT 39.6 36.0 - 46.0 %   MCV 92.7 80.0 - 100.0 fL    MCH 31.1 26.0 - 34.0 pg   MCHC 33.6 30.0 - 36.0 g/dL   RDW 13.1 11.5 - 15.5 %   Platelets 213 150 - 400 K/uL  nRBC 0.0 0.0 - 0.2 %    Comment: Performed at [facility], [place] [address] [place], [place] [address]  Comprehensive metabolic panel     Status: Abnormal   Collection Time: 02/03/20  4:20 AM  Result Value Ref Range   Sodium 142 135 - 145 mmol/L   Potassium 3.3 (L) 3.5 - 5.1 mmol/L   Chloride 109 98 - 111 mmol/L   CO2 24 22 - 32 mmol/L   Glucose, Bld 98 70 - 99 mg/dL    Comment: Glucose reference range applies only to samples taken after fasting for at least 8 hours.   BUN 11 6 - 20 mg/dL   Creatinine, Ser 0.49 0.44 - 1.00 mg/dL   Calcium 8.7 (L) 8.9 - 10.3 mg/dL   Total Protein 7.0 6.5 - 8.1 g/dL   Albumin 3.7 3.5 - 5.0 g/dL   AST 300 (H) 15 - 41 U/L   ALT 363 (H) 0 - 44 U/L   Alkaline Phosphatase 135 (H) 38 - 126 U/L   Total Bilirubin 4.6 (H) 0.3 - 1.2 mg/dL   GFR calc non Af Amer >60 >60 mL/min   GFR calc Af Amer >60 >60 mL/min   Anion gap 9 5 - 15    Comment: Performed at [facility], [place] [address] [place], [place] [address]  Lipase, blood     Status: Abnormal   Collection Time: 02/03/20  4:35 AM  Result Value Ref Range   Lipase 325 (H) 11 - 51 U/L    Comment: Performed at [facility], [place] [address] [place], [place] [address]   CT Abdomen Pelvis W Contrast  Result Date: 02/02/2020 CLINICAL DATA:  Upper abdominal pain with nausea and vomiting for several weeks, history of cholelithiasis EXAM: CT ABDOMEN AND PELVIS WITH CONTRAST TECHNIQUE: Multidetector CT imaging of the abdomen and pelvis was performed using the standard protocol following bolus administration of intravenous contrast. CONTRAST:  163m OMNIPAQUE IOHEXOL 300 MG/ML  SOLN COMPARISON:  04/06/2019 FINDINGS: Lower chest: Mild scarring in the bases bilaterally. No focal infiltrate or sizable effusion is seen. Hepatobiliary: Fatty infiltration of  the liver is noted. The gallbladder is again well visualized with multiple faceted gallstones within. No biliary ductal dilatation is seen. Pancreas: Mild peripancreatic inflammatory changes are noted in the region of the head and uncinate process consistent with focal pancreatitis. Spleen: Normal in size without focal abnormality. Adrenals/Urinary Tract: Adrenal glands are within normal limits. Kidneys demonstrate a normal enhancement pattern. Few scattered small cysts are noted. Scattered small renal calculi are noted as well similar to that seen on the prior exam. The largest of these lies in the upper pole of the right kidney measuring 2 mm. Bladder is within normal limits. Stomach/Bowel: The appendix is within normal limits. No obstructive or inflammatory changes of the colon are seen. Small bowel and stomach are unremarkable. Vascular/Lymphatic: No significant vascular findings are present. No enlarged abdominal or pelvic lymph nodes. Left iliac venous stent is noted. Reproductive: Uterus and bilateral adnexa are unremarkable. Changes of prior tubal ligation are seen Other: No abdominal wall hernia or abnormality. No abdominopelvic ascites. Musculoskeletal: No acute or significant osseous findings. IMPRESSION: Changes of mild acute pancreatitis surrounding pancreatic head and uncinate process. Cholelithiasis without complicating factors. Bilateral small renal calculi without obstructive change. Electronically Signed   By: MInez CatalinaM.D.   On: 02/02/2020 21:13   Anti-infectives (From admission, onward)   None  Assessment/Plan Hx Asthma - well controlled, has not needed inhaler in >1 year Hx DVT - 3 years ago, provoked by OCP, no longer on anticoagulation  Gallstone pancreatitis Hyperbilirubinemia - Patient with gallstone pancreatitis, recommend laparoscopic cholecystectomy this admission once pancreatitis resolves. Her total bilirubin is also up this morning, 4.6, concerning for possible  choledocholithiasis. GI consult is pending, she will need ERCP vs MRCP. Repeat labs in AM.  ID - none VTE - SCDs, lovenox FEN - IVF, NPO Foley - none Follow up - TBD  Wellington Hampshire, [facility] Surgery 02/03/2020, 8:47 AM Please see Amion for pager number during day hours 7:00am-4:30pm

## 2020-02-03 NOTE — Consult Note (Addendum)
[facility] Consultation Note  Referring Provider: [facility] Primary Care Physician:  Patient, No Pcp Per Primary Gastroenterologist:  [facility]  Reason for Consultation:  Gallstone pancreatitis  HPI: Paula Warner is a 34 y.o. female with history of cholelithiasis presenting with gallstone pancreatitis.  Patient has had intermittent abdominal pain for several months and was following with outpatient surgery for discussion of laparoscopic cholecystectomy.  However, yesterday, her pain acutely worsened.  She was having 10 out of 10 epigastric pain radiating to her upper back.  She also had nausea with 2 episodes of emesis and presented to the ED.  Today, patient reports feeling much better.  Her abdominal pain is currently only a 2 out of 10.  She still has some mild pain in her upper back.  She denies current nausea or vomiting.  She denies changes in appetite, unexplained weight loss, dysphagia, heartburn, changes in bowel habits, melena, hematochezia, hematemesis.  Denies recent or frequent alcohol use, only drinks on occasion.  She denies family history of pancreatitis for gastrointestinal malignancies.  Records reviewed: Patient had MRCP completed 01/11/2020 which showed cholelithiasis without cholecystitis or biliary obstruction.  At that time, she also had transaminitis with AST 177/ALT 418/alk phos 212/T bili 4.0.  Past Medical History:  Diagnosis Date  . Asthma   . History of asthma    no current med.  Marland Kitchen 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  . History of kidney stones   . Menstrual periods irregular     Past Surgical History:  Procedure Laterality Date  . CESAREAN SECTION     x 2  . LAPAROSCOPIC TUBAL LIGATION Bilateral 05/08/2017   Procedure: LAPAROSCOPIC TUBAL LIGATION - FILSHIE CLIPS;  Surgeon: Emily Filbert, MD;  Location: [facility];  Service: Gynecology;  Laterality: Bilateral;  . LOWER EXTREMITY ANGIOGRAM Left 07/11/2016    Procedure: Percutaneous Venous Thrombectomy with IVUS and  with Stent Placement;  Surgeon: Serafina Mitchell, MD;  Location: [facility] OR;  Service: Vascular;  Laterality: Left;    Prior to Admission medications   Medication Sig Start Date End Date Taking? Authorizing Provider  bismuth subsalicylate (PEPTO BISMOL) 262 MG/15ML suspension Take 30 mLs by mouth every 6 (six) hours as needed for indigestion or diarrhea or loose stools.   Yes [provider]  ibuprofen (ADVIL) 200 MG tablet Take 400 mg by mouth every 6 (six) hours as needed for headache or moderate pain.   Yes [provider]  methocarbamol (ROBAXIN) 500 MG tablet Take 1 tablet (500 mg total) by mouth at bedtime as needed for muscle spasms. 01/09/20  Yes Caccavale, Sophia, PA-C  medroxyPROGESTERone (PROVERA) 10 MG tablet Take 1 tablet (10 mg total) by mouth daily. Use for ten days Patient not taking: Reported on 04/06/2019 10/28/18 11/22/19  Elvera Maria, CNM    Current Facility-Administered Medications  Medication Dose Route Frequency Provider Last Rate Last Admin  . 0.9 %  sodium chloride infusion   Intravenous Continuous Etta Quill, DO 125 mL/hr at 02/03/20 0719 New Bag at 02/03/20 0719  . acetaminophen (TYLENOL) tablet 650 mg  650 mg Oral Q6H PRN Etta Quill, DO   650 mg at 02/02/20 2310   Or  . acetaminophen (TYLENOL) suppository 650 mg  650 mg Rectal Q6H PRN Etta Quill, DO      . enoxaparin (LOVENOX) injection 40 mg  40 mg Subcutaneous Q24H Jennette Kettle M, DO   40 mg at 02/02/20 2309  .  fentaNYL (SUBLIMAZE) injection 50 mcg  50 mcg Intravenous Q2H PRN Opyd, Ilene Qua, MD   50 mcg at 02/02/20 2323  . ondansetron (ZOFRAN) tablet 4 mg  4 mg Oral Q6H PRN Etta Quill, DO       Or  . ondansetron [facility]) injection 4 mg  4 mg Intravenous Q6H PRN Etta Quill, DO   4 mg at 02/03/20 0245  . sodium chloride flush (NS) 0.9 % injection 3 mL  3 mL Intravenous Once Hayden Rasmussen, MD         Allergies as of 02/02/2020 - Review Complete 02/02/2020  Allergen Reaction Noted  . Zinacef [cefuroxime] Hives 07/13/2016    Family History  Problem Relation Age of Onset  . Heart murmur Mother   . Asthma Mother   . Clotting disorder Father   . Heart disease Father     Social History   Socioeconomic History  . Marital status: Single    Spouse name: Not on file  . Number of children: Not on file  . Years of education: Not on file  . Highest education level: Not on file  Occupational History  . Not on file  Tobacco Use  . Smoking status: Never Smoker  . Smokeless tobacco: Never Used  Substance and Sexual Activity  . Alcohol use: No    Alcohol/week: 0.0 standard drinks  . Drug use: No  . Sexual activity: Yes    Partners: Male    Birth control/protection: Surgical  Other Topics Concern  . Not on file  Social History Narrative  . Not on file   Social Determinants of Health   Financial Resource Strain:   . Difficulty of Paying Living Expenses:   Food Insecurity:   . Worried About Charity fundraiser in the Last Year:   . Arboriculturist in the Last Year:   Transportation Needs:   . Film/video editor (Medical):   Marland Kitchen Lack of Transportation (Non-Medical):   Physical Activity:   . Days of Exercise per Week:   . Minutes of Exercise per Session:   Stress:   . Feeling of Stress :   Social Connections:   . Frequency of Communication with Friends and Family:   . Frequency of Social Gatherings with Friends and Family:   . Attends Religious Services:   . Active Member of Clubs or Organizations:   . Attends Archivist Meetings:   Marland Kitchen Marital Status:   Intimate Partner Violence:   . Fear of Current or Ex-Partner:   . Emotionally Abused:   Marland Kitchen Physically Abused:   . Sexually Abused:     Review of Systems: Positive for: Review of Systems  Constitutional: Negative for chills, fever and weight loss.  HENT: Negative for hearing loss and tinnitus.   Eyes:  Negative for blurred vision and pain.  Respiratory: Negative for cough and shortness of breath.   Cardiovascular: Negative for chest pain and palpitations.  Gastrointestinal: Positive for abdominal pain (epigastric). Negative for blood in stool, constipation, diarrhea, heartburn, melena, nausea and vomiting.  Genitourinary: Negative for dysuria and hematuria.  Musculoskeletal: Positive for back pain (upper back).  Skin: Negative for itching and rash.  Neurological: Negative for dizziness and loss of consciousness.  Endo/Heme/Allergies: Negative for polydipsia. Does not bruise/bleed easily.  Psychiatric/Behavioral: Negative for substance abuse. The patient is not nervous/anxious.     Physical Exam: Vital signs in last 24 hours: Temp:  [98.7 F (37.1 C)-98.9 F (37.2 C)] 98.7  F (37.1 C) (05/12 0635) Pulse Rate:  [62-78] 78 (05/12 0635) Resp:  [16-18] 17 (05/12 0635) BP: (103-124)/(68-96) 117/68 (05/12 0635) SpO2:  [96 %-100 %] 96 % (05/12 0635) Weight:  [70.7 kg] 70.7 kg (05/11 2300) Last BM Date: 02/01/20  Physical Exam  Constitutional: She is oriented to person, place, and time. She appears well-developed and well-nourished. No distress.  HENT:  Head: Normocephalic and atraumatic.  Eyes: EOM are normal. Scleral icterus is present.  Cardiovascular: Normal rate, regular rhythm and normal heart sounds.  Pulmonary/Chest: Effort normal and breath sounds normal. No respiratory distress.  Abdominal: Soft. Bowel sounds are normal. She exhibits no distension and no mass. There is no abdominal tenderness. There is no rebound and no guarding.  Musculoskeletal:        General: No deformity or edema.     Cervical back: Normal range of motion and neck supple.  Neurological: She is alert and oriented to person, place, and time.  Skin: Skin is warm and dry.  Psychiatric: She has a normal mood and affect. Her behavior is normal.   Lab Results: Recent Labs    02/02/20 1637 02/03/20 0420   WBC 8.9 7.8  HGB 14.5 13.3  HCT 43.1 39.6  PLT 236 213   BMET Recent Labs    02/02/20 1637 02/03/20 0420  NA 142 142  K 3.7 3.3*  CL 105 109  CO2 29 24  GLUCOSE 113* 98  BUN 10 11  CREATININE 0.64 0.49  CALCIUM 9.2 8.7*   LFT Recent Labs    02/03/20 0420  PROT 7.0  ALBUMIN 3.7  AST 300*  ALT 363*  ALKPHOS 135*  BILITOT 4.6*   PT/INR No results for input(s): LABPROT, INR in the last 72 hours.  Studies/Results: CT Abdomen Pelvis W Contrast  Result Date: 02/02/2020 CLINICAL DATA:  Upper abdominal pain with nausea and vomiting for several weeks, history of cholelithiasis EXAM: CT ABDOMEN AND PELVIS WITH CONTRAST TECHNIQUE: Multidetector CT imaging of the abdomen and pelvis was performed using the standard protocol following bolus administration of intravenous contrast. CONTRAST:  165m OMNIPAQUE IOHEXOL 300 MG/ML  SOLN COMPARISON:  04/06/2019 FINDINGS: Lower chest: Mild scarring in the bases bilaterally. No focal infiltrate or sizable effusion is seen. Hepatobiliary: Fatty infiltration of the liver is noted. The gallbladder is again well visualized with multiple faceted gallstones within. No biliary ductal dilatation is seen. Pancreas: Mild peripancreatic inflammatory changes are noted in the region of the head and uncinate process consistent with focal pancreatitis. Spleen: Normal in size without focal abnormality. Adrenals/Urinary Tract: Adrenal glands are within normal limits. Kidneys demonstrate a normal enhancement pattern. Few scattered small cysts are noted. Scattered small renal calculi are noted as well similar to that seen on the prior exam. The largest of these lies in the upper pole of the right kidney measuring 2 mm. Bladder is within normal limits. Stomach/Bowel: The appendix is within normal limits. No obstructive or inflammatory changes of the colon are seen. Small bowel and stomach are unremarkable. Vascular/Lymphatic: No significant vascular findings are present.  No enlarged abdominal or pelvic lymph nodes. Left iliac venous stent is noted. Reproductive: Uterus and bilateral adnexa are unremarkable. Changes of prior tubal ligation are seen Other: No abdominal wall hernia or abnormality. No abdominopelvic ascites. Musculoskeletal: No acute or significant osseous findings. IMPRESSION: Changes of mild acute pancreatitis surrounding pancreatic head and uncinate process. Cholelithiasis without complicating factors. Bilateral small renal calculi without obstructive change. Electronically Signed   By: MElta Guadeloupe  Lukens M.D.   On: 02/02/2020 21:13    Impression: Gallstone pancreatitis, improving.  Nontender abdomen during exam today. -Lipase 325 today as compared to 2172 yesterday. -Renal function remains normal: BUN 11/ Cr 0.49 -T. Bili 4.6/AST 300/ALT 363/Alk phos 135 today -WBCs remain normal (7.8) -CT 02/02/20 did not show any signs of biliary dilation  Plan: -Continue IVF and supportive care -Patient had similar elevations in transaminases in April and had negative MRCP at that time.  We will follow clinically, but defer MRCP/ERCP at this time.  Consider MRCP if increasing transaminases tomorrow. -Surgery is following.  Recommend laparoscopic cholecystectomy with intraoperative cholangiogram once pancreatitis improves. -Clear liquid diet ordered  [facility] will follow.   LOS: 1 day   Salley Slaughter  02/03/2020, 11:26 AM  Cell [phone] If no answer or after 5 PM call [phone]

## 2020-02-04 ENCOUNTER — Inpatient Hospital Stay (HOSPITAL_COMMUNITY): Payer: 59

## 2020-02-04 DIAGNOSIS — E876 Hypokalemia: Secondary | ICD-10-CM

## 2020-02-04 DIAGNOSIS — R7989 Other specified abnormal findings of blood chemistry: Secondary | ICD-10-CM

## 2020-02-04 LAB — CBC
HCT: 38 % (ref 36.0–46.0)
Hemoglobin: 12.7 g/dL (ref 12.0–15.0)
MCH: 32 pg (ref 26.0–34.0)
MCHC: 33.4 g/dL (ref 30.0–36.0)
MCV: 95.7 fL (ref 80.0–100.0)
Platelets: 210 10*3/uL (ref 150–400)
RBC: 3.97 MIL/uL (ref 3.87–5.11)
RDW: 13.1 % (ref 11.5–15.5)
WBC: 7.1 10*3/uL (ref 4.0–10.5)
nRBC: 0 % (ref 0.0–0.2)

## 2020-02-04 LAB — COMPREHENSIVE METABOLIC PANEL
ALT: 261 U/L — ABNORMAL HIGH (ref 0–44)
ALT: 254 U/L — ABNORMAL HIGH (ref 0–44)
AST: 128 U/L — ABNORMAL HIGH (ref 15–41)
AST: 108 U/L — ABNORMAL HIGH (ref 15–41)
Albumin: 3.2 g/dL — ABNORMAL LOW (ref 3.5–5.0)
Albumin: 3.7 g/dL (ref 3.5–5.0)
Alkaline Phosphatase: 145 U/L — ABNORMAL HIGH (ref 38–126)
Alkaline Phosphatase: 158 U/L — ABNORMAL HIGH (ref 38–126)
Anion gap: 4 — ABNORMAL LOW (ref 5–15)
Anion gap: 7 (ref 5–15)
BUN: 5 mg/dL — ABNORMAL LOW (ref 6–20)
BUN: <5 mg/dL — ABNORMAL LOW (ref 6–20)
CO2: 23 mmol/L (ref 22–32)
CO2: 24 mmol/L (ref 22–32)
Calcium: 8.3 mg/dL — ABNORMAL LOW (ref 8.9–10.3)
Calcium: 8.7 mg/dL — ABNORMAL LOW (ref 8.9–10.3)
Chloride: 110 mmol/L (ref 98–111)
Chloride: 110 mmol/L (ref 98–111)
Creatinine, Ser: 0.54 mg/dL (ref 0.44–1.00)
Creatinine, Ser: 0.54 mg/dL (ref 0.44–1.00)
GFR calc Af Amer: >60 mL/min (ref >60)
GFR calc Af Amer: >60 mL/min (ref >60)
GFR calc non Af Amer: >60 mL/min (ref >60)
GFR calc non Af Amer: >60 mL/min (ref >60)
Glucose, Bld: 85 mg/dL (ref 70–99)
Glucose, Bld: 87 mg/dL (ref 70–99)
Potassium: 3.6 mmol/L (ref 3.5–5.1)
Potassium: 3.8 mmol/L (ref 3.5–5.1)
Sodium: 137 mmol/L (ref 135–145)
Sodium: 141 mmol/L (ref 135–145)
Total Bilirubin: 4.1 mg/dL — ABNORMAL HIGH (ref 0.3–1.2)
Total Bilirubin: 4 mg/dL — ABNORMAL HIGH (ref 0.3–1.2)
Total Protein: 6.2 g/dL — ABNORMAL LOW (ref 6.5–8.1)
Total Protein: 7 g/dL (ref 6.5–8.1)

## 2020-02-04 LAB — LIPASE, BLOOD: Lipase: 56 U/L — ABNORMAL HIGH (ref 11–51)

## 2020-02-04 LAB — SURGICAL PCR SCREEN
MRSA, PCR: NEGATIVE
Staphylococcus aureus: NEGATIVE

## 2020-02-04 IMAGING — MR MR 3D RECON AT SCANNER
8 of 11 series · 30 of 48 positions shown · non-contrast
Comparison: CT abdomen and pelvis [DATE]

CLINICAL DATA: Cholelithiasis, suspected pancreatitis

EXAM:
MRI ABDOMEN WITHOUT CONTRAST  (INCLUDING MRCP)
TECHNIQUE: Multiplanar multisequence MR imaging of the abdomen was performed.
Heavily T2-weighted images of the biliary and pancreatic ducts were
obtained, and three-dimensional MRCP images were rendered by post
processing.

[Series 3: T2 fat-sat · axial · 6.0mm · 1.25mm/px · z∈[-111,+141]mm · 2 of 36 slices shown]
[im 1/36]
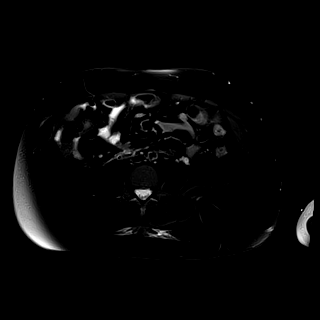
[im 36/36]
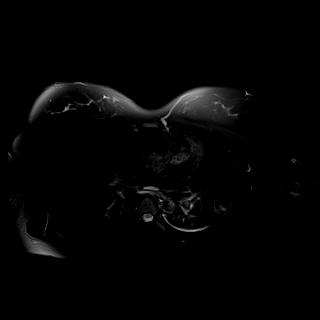

[Series 5: bSSFP · coronal · 6.0mm · 0.74mm/px · 3 of 35 slices shown]
[im 1/35]
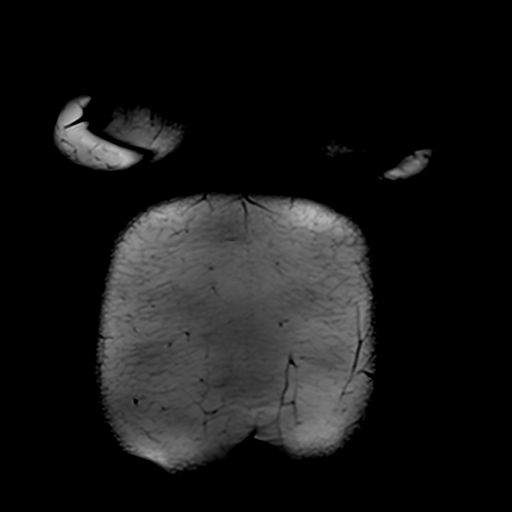
[im 18/35]
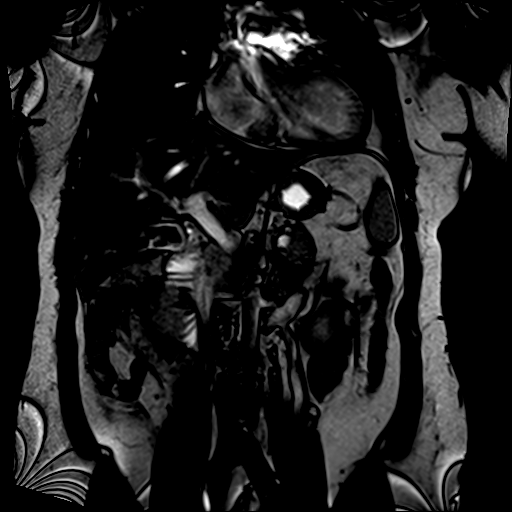
[im 35/35]
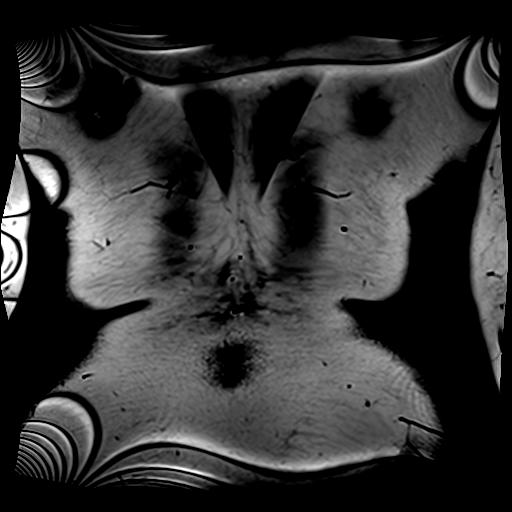

[Series 6: DWI · axial · 6.0mm · 1.49mm/px · z∈[-111,+141]mm · 6 of 72 slices shown (1 of 2)]
[im 1/72]
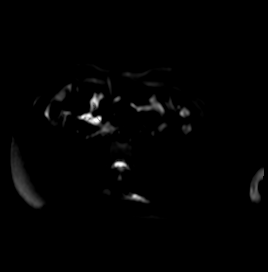
[im 15/72]
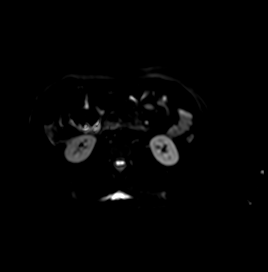
[im 29/72]
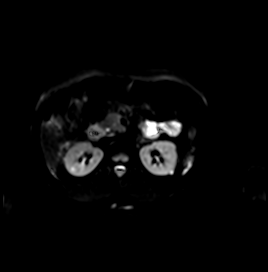
[im 43/72]
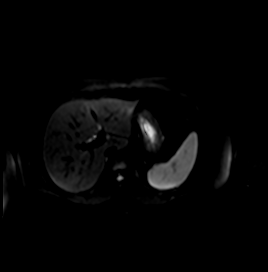
[im 57/72]
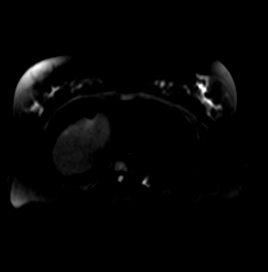
[im 72/72]
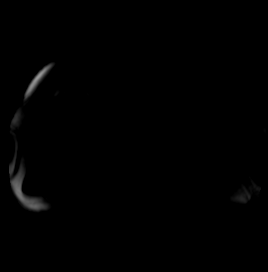

[Series 7: DWI · axial · 6.0mm · 1.49mm/px · z∈[-111,+141]mm · 3 of 36 slices shown (2 of 2)]
[im 1/36]
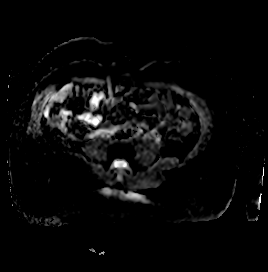
[im 18/36]
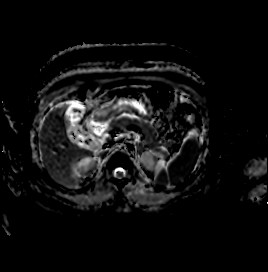
[im 36/36]
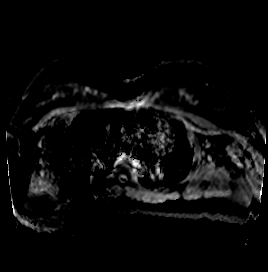

[Series 9: T1 · axial · 3.0mm · 1.25mm/px · z∈[-119,+118]mm · 6 of 80 slices shown (1 of 2)]
[im 1/80]
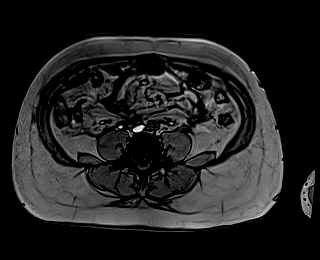
[im 16/80]
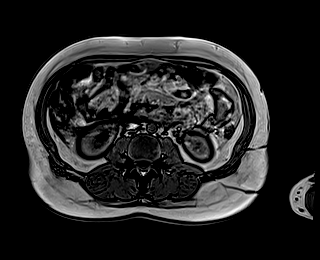
[im 32/80]
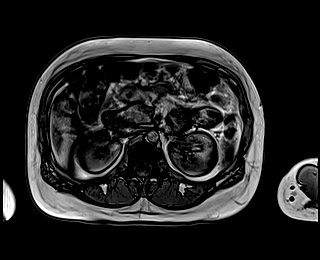
[im 48/80]
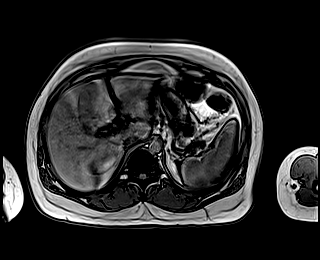
[im 64/80]
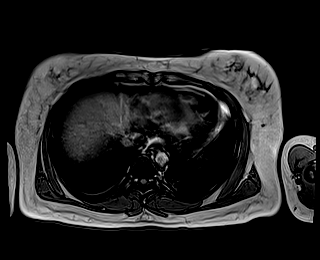
[im 80/80]
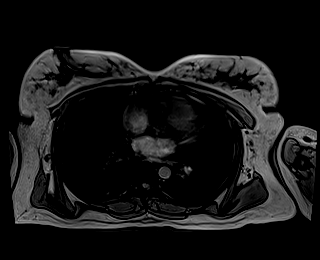

[Series 10: T1 · axial · 3.0mm · 1.25mm/px · z∈[-119,+118]mm · 6 of 80 slices shown (2 of 2)]
[im 1/80]
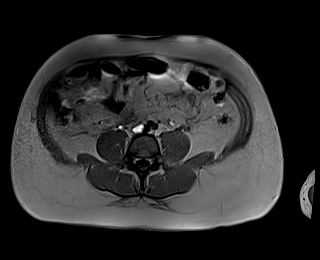
[im 16/80]
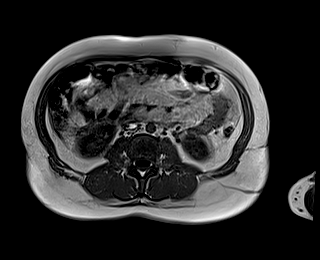
[im 32/80]
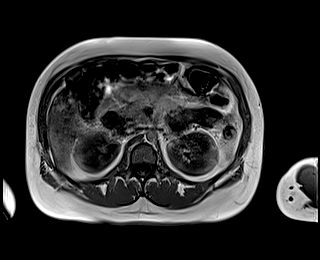
[im 48/80]
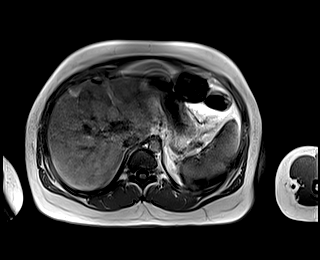
[im 64/80]
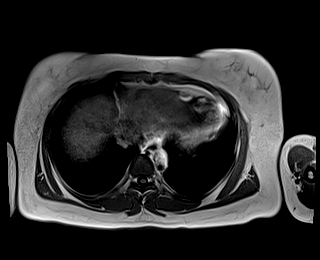
[im 80/80]
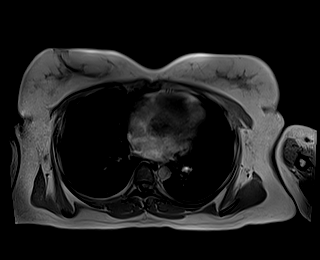

[Series 11: cor obl thk · sagittal · 50.0mm · 0.78mm/px · 1 of 9 slices shown]
[im 1/9]
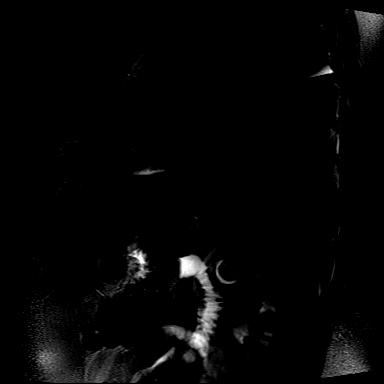

[Series 12: cor_3d_spc_trig · coronal · 1.8mm · 0.49mm/px · 3 of 72 slices shown]
[im 1/72]
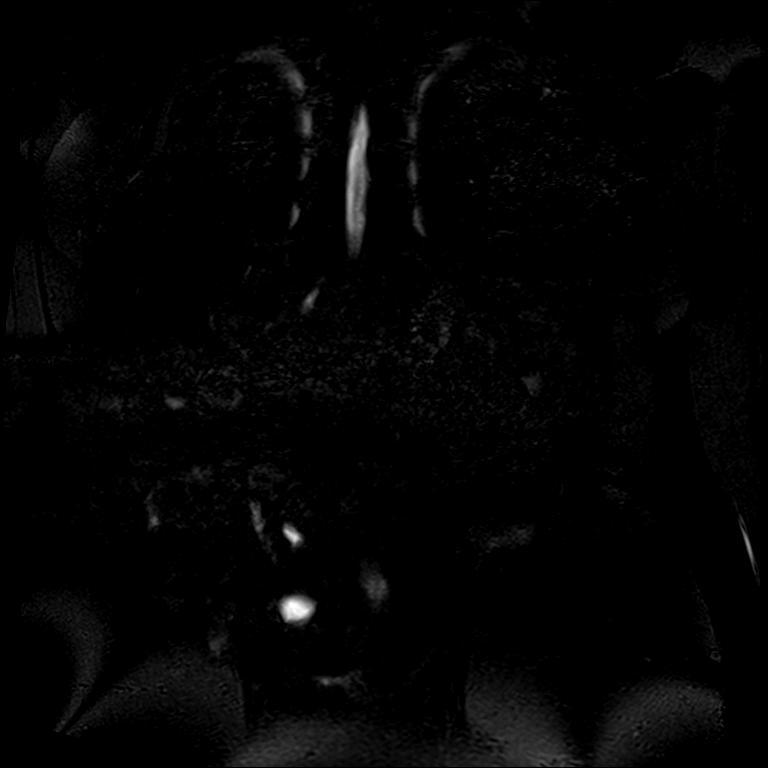
[im 15/72]
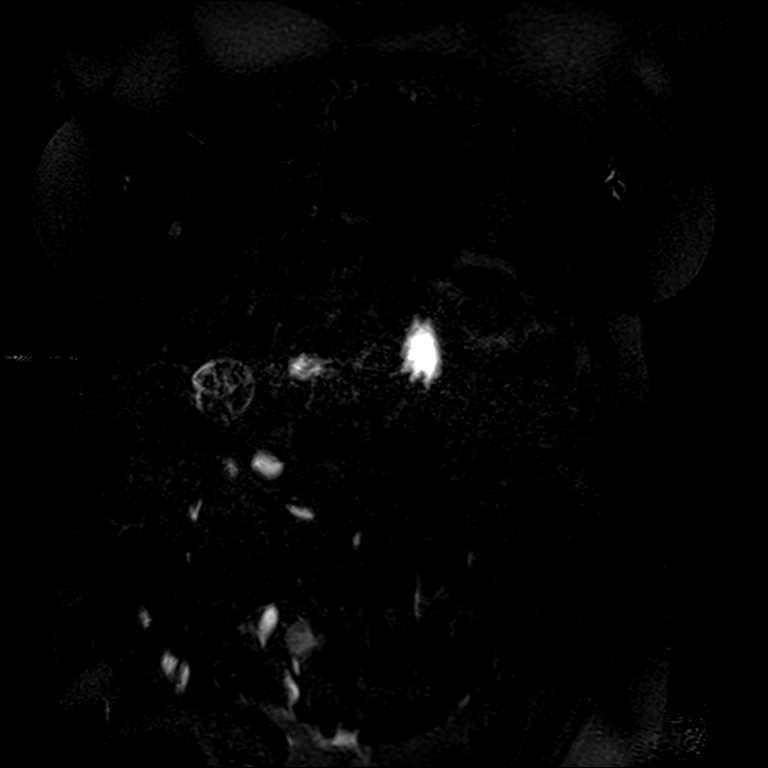
[im 29/72]
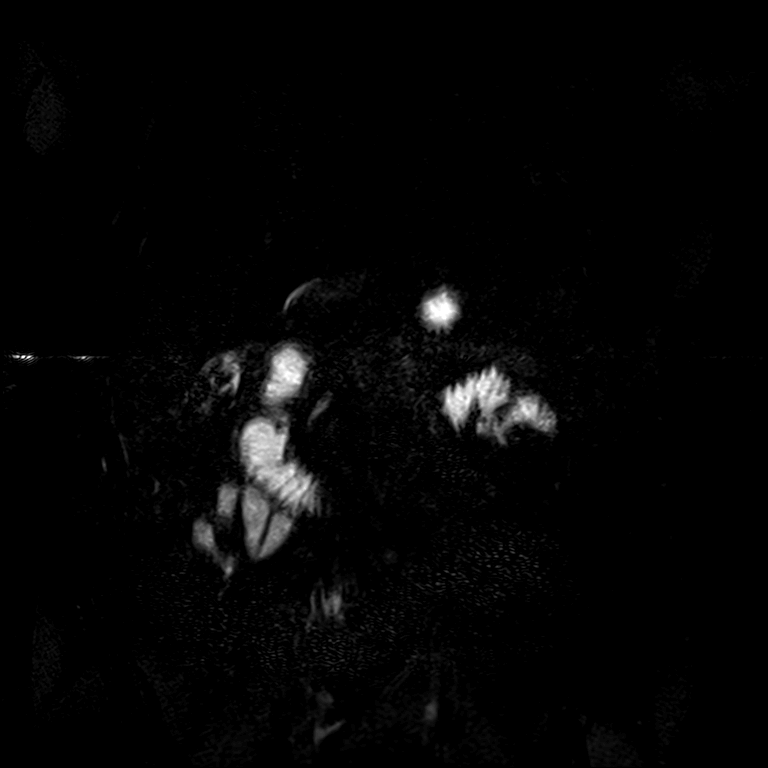

[30 of 48 positions shown; findings below may reference images not displayed]

FINDINGS: Lower chest: Incidental imaging of the lung bases with signs of
basilar atelectasis or airspace disease on the LEFT not well
evaluated on MR.

Hepatobiliary: Liver is unremarkable.

Numerous gallstones in the gallbladder without pericholecystic
stranding. No biliary ductal dilation. Assessment of the biliary
tree is limited due to motion on MRCP images. Radial thick slab MRCP
sequences without sign of filling defect in the common bile duct.

Pancreas: Pancreas with minimal peripancreatic edema about the head
and uncinate process. No ductal dilation. No sign of pancreatic
lesion.

Spleen:  Spleen is normal size without focal lesion.

Adrenals/Urinary Tract: Adrenal glands are normal. Renal contours
are smooth without signs of hydronephrosis or renal lesion.

Stomach/Bowel: Gastrointestinal tract is unremarkable to the extent
visualized with limited assessment on MRI.

Vascular/Lymphatic: Vascular structures in the abdomen are grossly
patent.

Other:  No ascites.

Musculoskeletal: No suspicious bone lesions identified.
IMPRESSION: 1. Mild peripancreatic edema about the head and uncinate process, no
fluid collection or change in T1 signal of pancreatic parenchyma
beyond subtle decreased signal in the area of edema findings suggest
mild acute interstitial pancreatitis.
2. Assessment of the biliary tree is limited due to motion on MRCP
images. No sign of filling defect in the common bile duct. No
biliary ductal dilation.
3. Cholelithiasis without current signs of acute cholecystitis.
Study limited by lack of intravenous contrast.
4. Incidental signs of basilar atelectasis or airspace disease in
the LEFT lung base not well evaluated on MR.

## 2020-02-04 IMAGING — MR MR MRCP
8 of 11 series · 30 of 48 positions shown · non-contrast
Comparison: CT abdomen and pelvis [DATE]

CLINICAL DATA: Cholelithiasis, suspected pancreatitis

EXAM:
MRI ABDOMEN WITHOUT CONTRAST  (INCLUDING MRCP)
TECHNIQUE: Multiplanar multisequence MR imaging of the abdomen was performed.
Heavily T2-weighted images of the biliary and pancreatic ducts were
obtained, and three-dimensional MRCP images were rendered by post
processing.

[Series 3: T2 fat-sat · axial · 6.0mm · 1.25mm/px · z∈[-111,+141]mm · 2 of 36 slices shown]
[im 1/36]
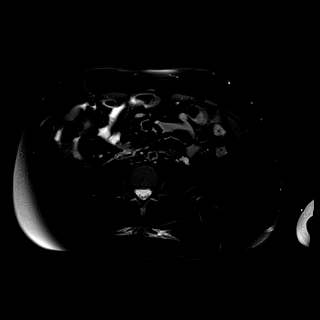
[im 36/36]
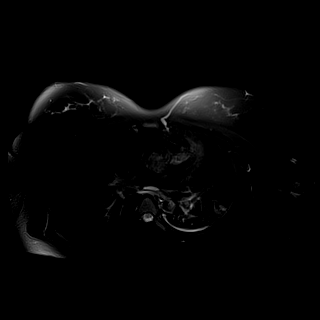

[Series 5: bSSFP · coronal · 6.0mm · 0.74mm/px · 3 of 35 slices shown]
[im 1/35]
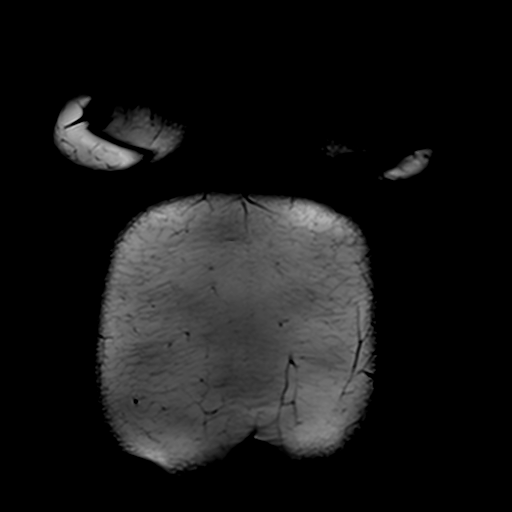
[im 18/35]
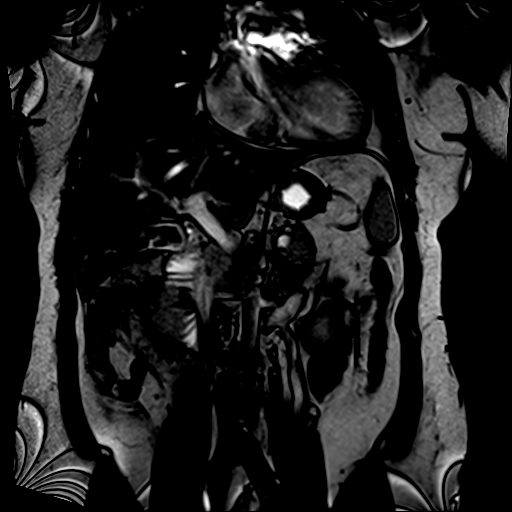
[im 35/35]
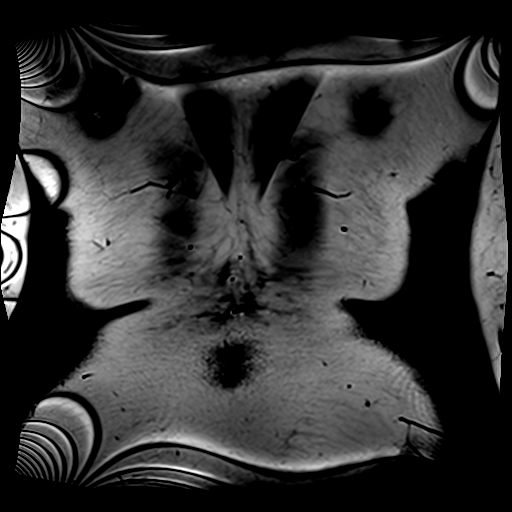

[Series 6: DWI · axial · 6.0mm · 1.49mm/px · z∈[-111,+141]mm · 6 of 72 slices shown (1 of 2)]
[im 1/72]
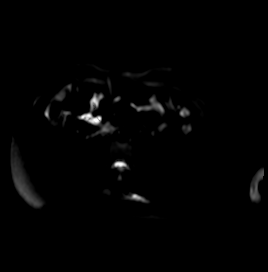
[im 15/72]
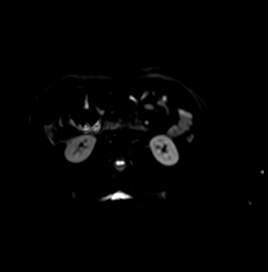
[im 29/72]
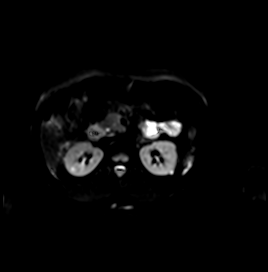
[im 43/72]
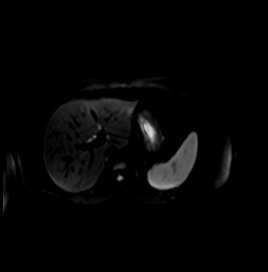
[im 57/72]
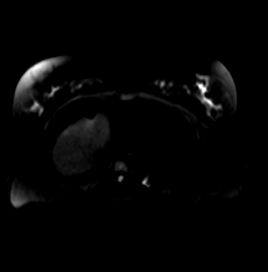
[im 72/72]
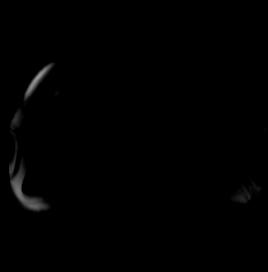

[Series 7: DWI · axial · 6.0mm · 1.49mm/px · z∈[-111,+141]mm · 3 of 36 slices shown (2 of 2)]
[im 1/36]
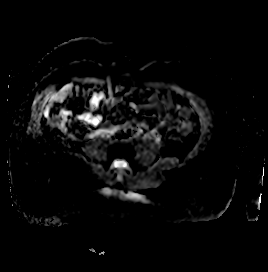
[im 18/36]
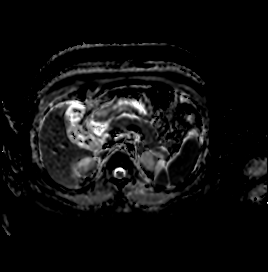
[im 36/36]
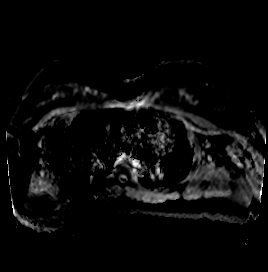

[Series 9: T1 · axial · 3.0mm · 1.25mm/px · z∈[-119,+118]mm · 6 of 80 slices shown (1 of 2)]
[im 1/80]
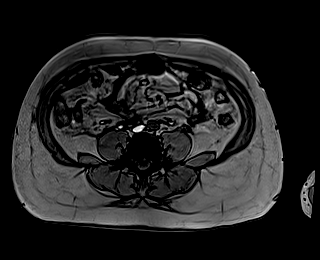
[im 16/80]
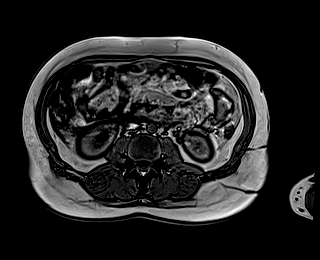
[im 32/80]
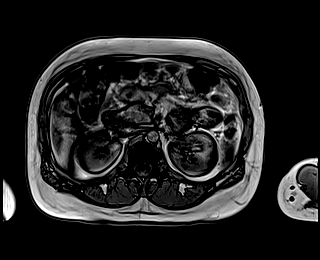
[im 48/80]
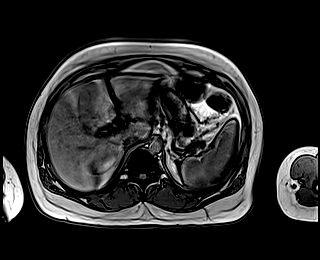
[im 64/80]
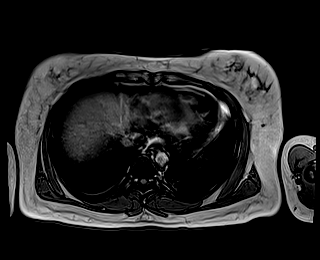
[im 80/80]
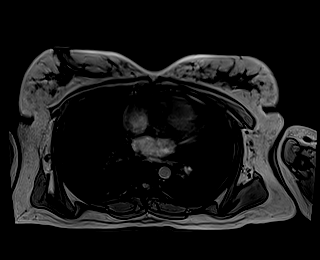

[Series 10: T1 · axial · 3.0mm · 1.25mm/px · z∈[-119,+118]mm · 6 of 80 slices shown (2 of 2)]
[im 1/80]
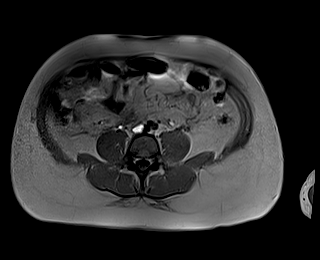
[im 16/80]
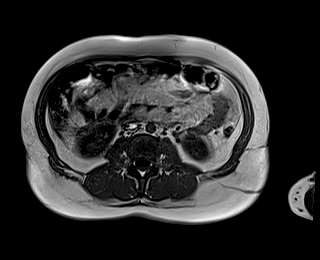
[im 32/80]
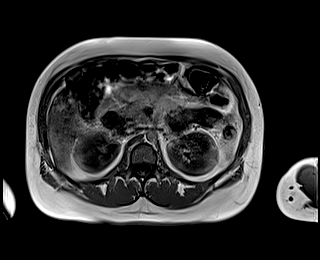
[im 48/80]
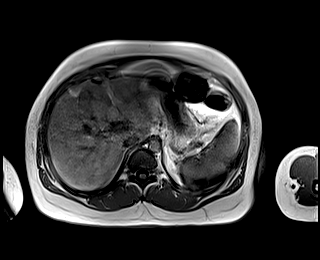
[im 64/80]
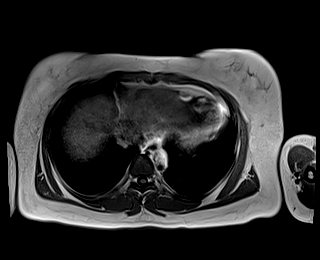
[im 80/80]
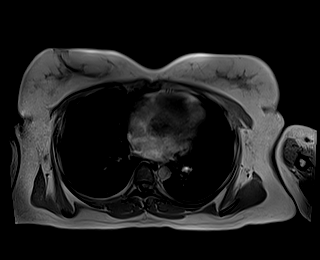

[Series 11: cor obl thk · sagittal · 50.0mm · 0.78mm/px · 1 of 9 slices shown]
[im 1/9]
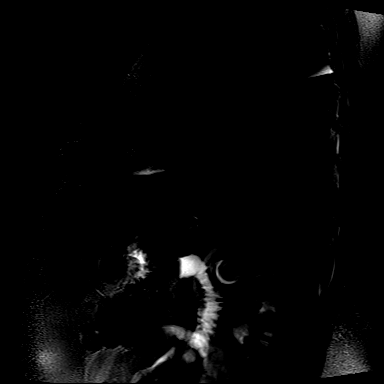

[Series 12: cor_3d_spc_trig · coronal · 1.8mm · 0.49mm/px · 3 of 72 slices shown]
[im 1/72]
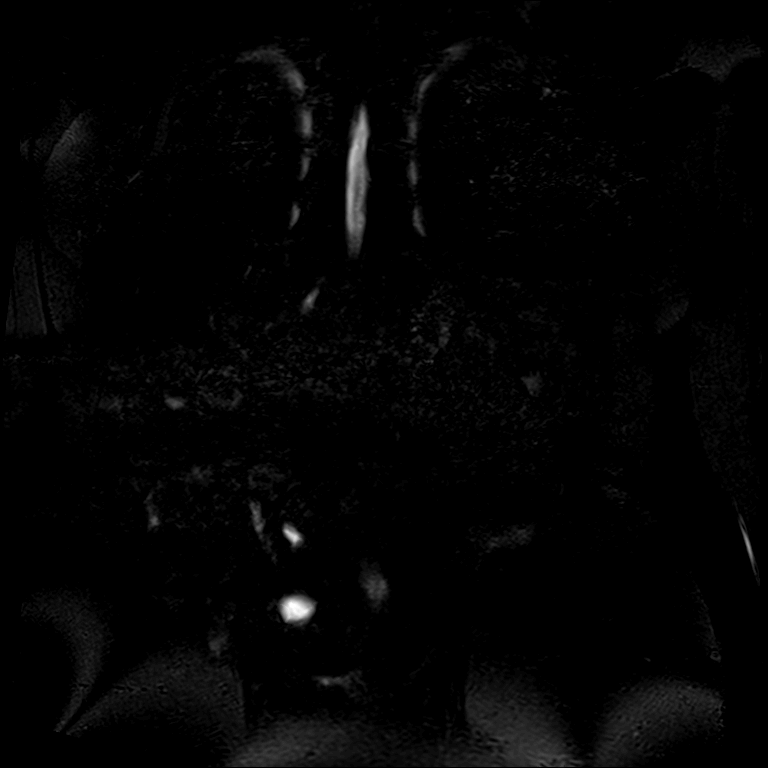
[im 15/72]
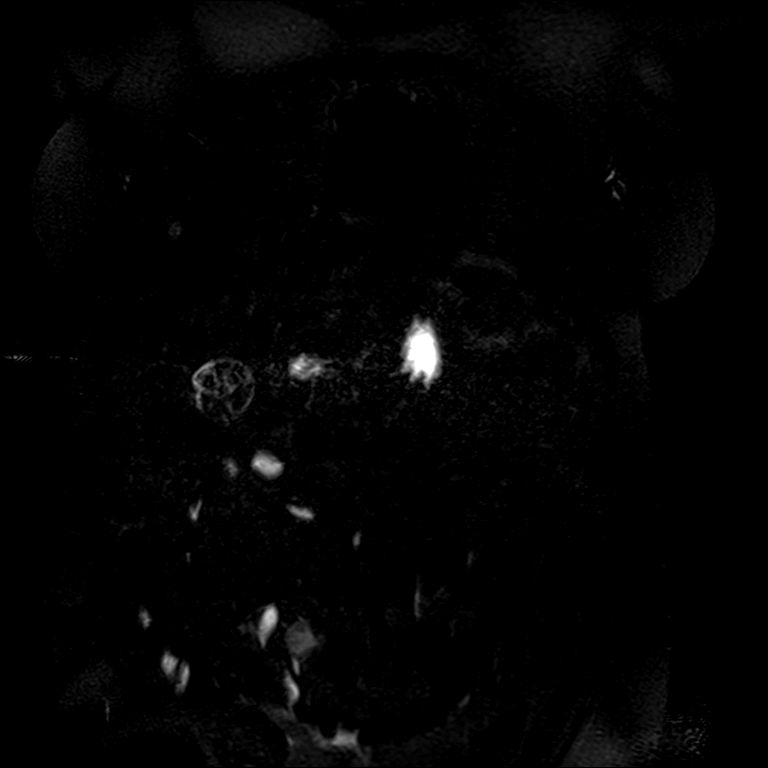
[im 29/72]
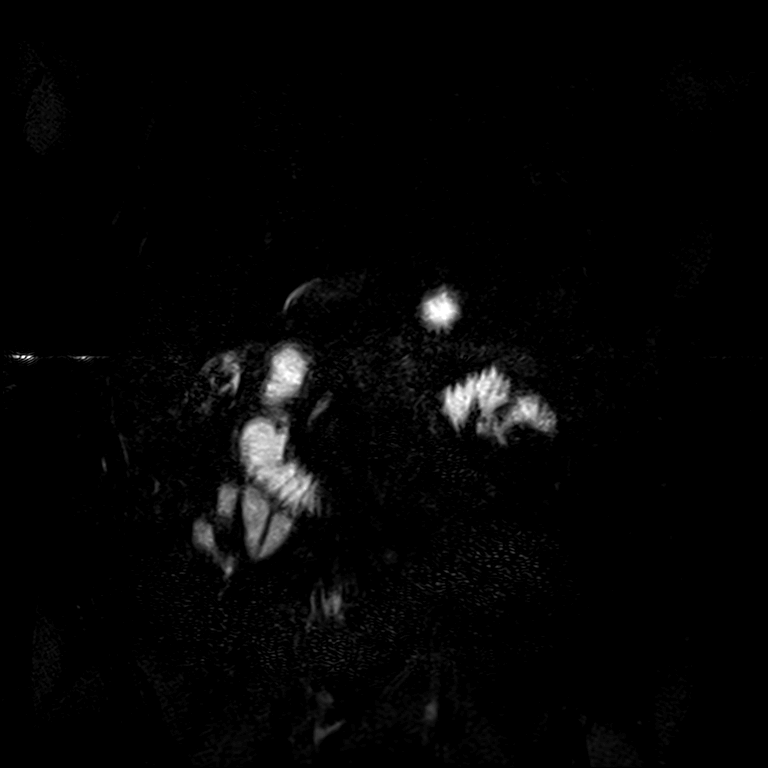

[30 of 48 positions shown; findings below may reference images not displayed]

FINDINGS: Lower chest: Incidental imaging of the lung bases with signs of
basilar atelectasis or airspace disease on the LEFT not well
evaluated on MR.

Hepatobiliary: Liver is unremarkable.

Numerous gallstones in the gallbladder without pericholecystic
stranding. No biliary ductal dilation. Assessment of the biliary
tree is limited due to motion on MRCP images. Radial thick slab MRCP
sequences without sign of filling defect in the common bile duct.

Pancreas: Pancreas with minimal peripancreatic edema about the head
and uncinate process. No ductal dilation. No sign of pancreatic
lesion.

Spleen:  Spleen is normal size without focal lesion.

Adrenals/Urinary Tract: Adrenal glands are normal. Renal contours
are smooth without signs of hydronephrosis or renal lesion.

Stomach/Bowel: Gastrointestinal tract is unremarkable to the extent
visualized with limited assessment on MRI.

Vascular/Lymphatic: Vascular structures in the abdomen are grossly
patent.

Other:  No ascites.

Musculoskeletal: No suspicious bone lesions identified.
IMPRESSION: 1. Mild peripancreatic edema about the head and uncinate process, no
fluid collection or change in T1 signal of pancreatic parenchyma
beyond subtle decreased signal in the area of edema findings suggest
mild acute interstitial pancreatitis.
2. Assessment of the biliary tree is limited due to motion on MRCP
images. No sign of filling defect in the common bile duct. No
biliary ductal dilation.
3. Cholelithiasis without current signs of acute cholecystitis.
Study limited by lack of intravenous contrast.
4. Incidental signs of basilar atelectasis or airspace disease in
the LEFT lung base not well evaluated on MR.

## 2020-02-04 MED ORDER — DEXTROSE IN LACTATED RINGERS 5 % IV SOLN: INTRAVENOUS | Status: DC

## 2020-02-04 NOTE — Plan of Care (Signed)
Slight decrease AST/ALT/TB, slight increase ALP.  I have ordered repeat MRCP today for reassessment.

## 2020-02-04 NOTE — H&P (View-Only) (Signed)
[facility] Progress Note     Subjective: CC-  Feeling much better today. Denies any abdominal pain. Tolerated clears yesterday. Denies n/v. Lipase down to 56. Tbili down to 4.1. GI has ordered and MRCP.  Objective: Vital signs in last 24 hours: Temp:  [98.6 F (37 C)-98.8 F (37.1 C)] 98.7 F (37.1 C) (05/13 0604) Pulse Rate:  [71-84] 71 (05/13 0604) Resp:  [18] 18 (05/13 0604) BP: (112-122)/(62-70) 112/69 (05/13 0604) SpO2:  [97 %-99 %] 97 % (05/13 0604) Last BM Date: 02/03/20  Intake/Output from previous day: 05/12 0701 - 05/13 0700 In: 2842.7 [P.O.:599; I.V.:1982; IV Piggyback:261.7] Out: 4100 [Urine:4100] Intake/Output this shift: No intake/output data recorded.  PE: Gen:  Alert, NAD, pleasant HEENT: EOM's intact, pupils equal and round Card:  RRR Pulm:  CTAB, no W/R/R, rate and effort normal Abd: Soft, NT/ND, +BS Skin: no rashes noted, warm and dry  Lab Results:  Recent Labs    02/03/20 0420 02/04/20 0428  WBC 7.8 7.1  HGB 13.3 12.7  HCT 39.6 38.0  PLT 213 210   BMET Recent Labs    02/03/20 0420 02/04/20 0428  NA 142 137  K 3.3* 3.6  CL 109 110  CO2 24 23  GLUCOSE 98 85  BUN 11 5*  CREATININE 0.49 0.54  CALCIUM 8.7* 8.3*   PT/INR No results for input(s): LABPROT, INR in the last 72 hours. CMP     Component Value Date/Time   NA 137 02/04/2020 0428   K 3.6 02/04/2020 0428   CL 110 02/04/2020 0428   CO2 23 02/04/2020 0428   GLUCOSE 85 02/04/2020 0428   BUN 5 (L) 02/04/2020 0428   CREATININE 0.54 02/04/2020 0428   CREATININE 0.53 06/23/2015 1124   CALCIUM 8.3 (L) 02/04/2020 0428   PROT 6.2 (L) 02/04/2020 0428   ALBUMIN 3.2 (L) 02/04/2020 0428   AST 128 (H) 02/04/2020 0428   ALT 261 (H) 02/04/2020 0428   ALKPHOS 145 (H) 02/04/2020 0428   BILITOT 4.1 (H) 02/04/2020 0428   GFRNONAA >60 02/04/2020 0428   GFRAA >60 02/04/2020 0428   Lipase     Component Value Date/Time   LIPASE 56 (H) 02/04/2020 0428        Studies/Results: CT Abdomen Pelvis W Contrast  Result Date: 02/02/2020 CLINICAL DATA:  Upper abdominal pain with nausea and vomiting for several weeks, history of cholelithiasis EXAM: CT ABDOMEN AND PELVIS WITH CONTRAST TECHNIQUE: Multidetector CT imaging of the abdomen and pelvis was performed using the standard protocol following bolus administration of intravenous contrast. CONTRAST:  151mL OMNIPAQUE IOHEXOL 300 MG/ML  SOLN COMPARISON:  04/06/2019 FINDINGS: Lower chest: Mild scarring in the bases bilaterally. No focal infiltrate or sizable effusion is seen. Hepatobiliary: Fatty infiltration of the liver is noted. The gallbladder is again well visualized with multiple faceted gallstones within. No biliary ductal dilatation is seen. Pancreas: Mild peripancreatic inflammatory changes are noted in the region of the head and uncinate process consistent with focal pancreatitis. Spleen: Normal in size without focal abnormality. Adrenals/Urinary Tract: Adrenal glands are within normal limits. Kidneys demonstrate a normal enhancement pattern. Few scattered small cysts are noted. Scattered small renal calculi are noted as well similar to that seen on the prior exam. The largest of these lies in the upper pole of the right kidney measuring 2 mm. Bladder is within normal limits. Stomach/Bowel: The appendix is within normal limits. No obstructive or inflammatory changes of the colon are seen. Small bowel and stomach are unremarkable. Vascular/Lymphatic:  No significant vascular findings are present. No enlarged abdominal or pelvic lymph nodes. Left iliac venous stent is noted. Reproductive: Uterus and bilateral adnexa are unremarkable. Changes of prior tubal ligation are seen Other: No abdominal wall hernia or abnormality. No abdominopelvic ascites. Musculoskeletal: No acute or significant osseous findings. IMPRESSION: Changes of mild acute pancreatitis surrounding pancreatic head and uncinate process.  Cholelithiasis without complicating factors. Bilateral small renal calculi without obstructive change. Electronically Signed   By: Inez Catalina M.D.   On: 02/02/2020 21:13    Anti-infectives: Anti-infectives (From admission, onward)   Start     Dose/Rate Route Frequency Ordered Stop   02/04/20 0600  clindamycin (CLEOCIN) IVPB 900 mg     900 mg 100 mL/hr over 30 Minutes Intravenous On call to O.R. 02/03/20 1653 02/05/20 0559   02/04/20 0600  gentamicin (GARAMYCIN) 320 mg in dextrose 5 % 100 mL IVPB     320 mg 216 mL/hr over 30 Minutes Intravenous On call to O.R. 02/03/20 1653 02/05/20 0559       Assessment/Plan Hx Asthma - well controlled, has not needed inhaler in >1 year Hx DVT - 3 years ago, provoked by OCP, no longer on anticoagulation  Gallstone pancreatitis Hyperbilirubinemia - Abdominal pain resolved and lipase nearly normalized (56); pancreatitis seems to have improved. LFTs downtrending but total bilirubin still >4, GI has ordered an MRCP for further evaluation. If negative for choledocholithiasis may be able to proceed with laparoscopic cholecystectomy with IOC today if timing allows. Keep NPO.  ID - none VTE - SCDs, lovenox FEN - IVF, NPO Foley - none Follow up - TBD   LOS: 2 days    Wellington Hampshire, [facility] 02/04/2020, 8:49 AM Please see Amion for pager number during day hours 7:00am-4:30pm

## 2020-02-04 NOTE — Progress Notes (Signed)
[facility] Progress Note     Subjective: CC-  Feeling much better today. Denies any abdominal pain. Tolerated clears yesterday. Denies n/v. Lipase down to 56. Tbili down to 4.1. GI has ordered and MRCP.  Objective: Vital signs in last 24 hours: Temp:  [98.6 F (37 C)-98.8 F (37.1 C)] 98.7 F (37.1 C) (05/13 0604) Pulse Rate:  [71-84] 71 (05/13 0604) Resp:  [18] 18 (05/13 0604) BP: (112-122)/(62-70) 112/69 (05/13 0604) SpO2:  [97 %-99 %] 97 % (05/13 0604) Last BM Date: 02/03/20  Intake/Output from previous day: 05/12 0701 - 05/13 0700 In: 2842.7 [P.O.:599; I.V.:1982; IV Piggyback:261.7] Out: 4100 [Urine:4100] Intake/Output this shift: No intake/output data recorded.  PE: Gen:  Alert, NAD, pleasant HEENT: EOM's intact, pupils equal and round Card:  RRR Pulm:  CTAB, no W/R/R, rate and effort normal Abd: Soft, NT/ND, +BS Skin: no rashes noted, warm and dry  Lab Results:  Recent Labs    02/03/20 0420 02/04/20 0428  WBC 7.8 7.1  HGB 13.3 12.7  HCT 39.6 38.0  PLT 213 210   BMET Recent Labs    02/03/20 0420 02/04/20 0428  NA 142 137  K 3.3* 3.6  CL 109 110  CO2 24 23  GLUCOSE 98 85  BUN 11 5*  CREATININE 0.49 0.54  CALCIUM 8.7* 8.3*   PT/INR No results for input(s): LABPROT, INR in the last 72 hours. CMP     Component Value Date/Time   NA 137 02/04/2020 0428   K 3.6 02/04/2020 0428   CL 110 02/04/2020 0428   CO2 23 02/04/2020 0428   GLUCOSE 85 02/04/2020 0428   BUN 5 (L) 02/04/2020 0428   CREATININE 0.54 02/04/2020 0428   CREATININE 0.53 06/23/2015 1124   CALCIUM 8.3 (L) 02/04/2020 0428   PROT 6.2 (L) 02/04/2020 0428   ALBUMIN 3.2 (L) 02/04/2020 0428   AST 128 (H) 02/04/2020 0428   ALT 261 (H) 02/04/2020 0428   ALKPHOS 145 (H) 02/04/2020 0428   BILITOT 4.1 (H) 02/04/2020 0428   GFRNONAA >60 02/04/2020 0428   GFRAA >60 02/04/2020 0428   Lipase     Component Value Date/Time   LIPASE 56 (H) 02/04/2020 0428        Studies/Results: CT Abdomen Pelvis W Contrast  Result Date: 02/02/2020 CLINICAL DATA:  Upper abdominal pain with nausea and vomiting for several weeks, history of cholelithiasis EXAM: CT ABDOMEN AND PELVIS WITH CONTRAST TECHNIQUE: Multidetector CT imaging of the abdomen and pelvis was performed using the standard protocol following bolus administration of intravenous contrast. CONTRAST:  125mL OMNIPAQUE IOHEXOL 300 MG/ML  SOLN COMPARISON:  04/06/2019 FINDINGS: Lower chest: Mild scarring in the bases bilaterally. No focal infiltrate or sizable effusion is seen. Hepatobiliary: Fatty infiltration of the liver is noted. The gallbladder is again well visualized with multiple faceted gallstones within. No biliary ductal dilatation is seen. Pancreas: Mild peripancreatic inflammatory changes are noted in the region of the head and uncinate process consistent with focal pancreatitis. Spleen: Normal in size without focal abnormality. Adrenals/Urinary Tract: Adrenal glands are within normal limits. Kidneys demonstrate a normal enhancement pattern. Few scattered small cysts are noted. Scattered small renal calculi are noted as well similar to that seen on the prior exam. The largest of these lies in the upper pole of the right kidney measuring 2 mm. Bladder is within normal limits. Stomach/Bowel: The appendix is within normal limits. No obstructive or inflammatory changes of the colon are seen. Small bowel and stomach are unremarkable. Vascular/Lymphatic:  No significant vascular findings are present. No enlarged abdominal or pelvic lymph nodes. Left iliac venous stent is noted. Reproductive: Uterus and bilateral adnexa are unremarkable. Changes of prior tubal ligation are seen Other: No abdominal wall hernia or abnormality. No abdominopelvic ascites. Musculoskeletal: No acute or significant osseous findings. IMPRESSION: Changes of mild acute pancreatitis surrounding pancreatic head and uncinate process.  Cholelithiasis without complicating factors. Bilateral small renal calculi without obstructive change. Electronically Signed   By: Inez Catalina M.D.   On: 02/02/2020 21:13    Anti-infectives: Anti-infectives (From admission, onward)   Start     Dose/Rate Route Frequency Ordered Stop   02/04/20 0600  clindamycin (CLEOCIN) IVPB 900 mg     900 mg 100 mL/hr over 30 Minutes Intravenous On call to O.R. 02/03/20 1653 02/05/20 0559   02/04/20 0600  gentamicin (GARAMYCIN) 320 mg in dextrose 5 % 100 mL IVPB     320 mg 216 mL/hr over 30 Minutes Intravenous On call to O.R. 02/03/20 1653 02/05/20 0559       Assessment/Plan Hx Asthma - well controlled, has not needed inhaler in >1 year Hx DVT - 3 years ago, provoked by OCP, no longer on anticoagulation  Gallstone pancreatitis Hyperbilirubinemia - Abdominal pain resolved and lipase nearly normalized (56); pancreatitis seems to have improved. LFTs downtrending but total bilirubin still >4, GI has ordered an MRCP for further evaluation. If negative for choledocholithiasis may be able to proceed with laparoscopic cholecystectomy with IOC today if timing allows. Keep NPO.  ID - none VTE - SCDs, lovenox FEN - IVF, NPO Foley - none Follow up - TBD   LOS: 2 days    Wellington Hampshire, [facility] 02/04/2020, 8:49 AM Please see Amion for pager number during day hours 7:00am-4:30pm

## 2020-02-04 NOTE — Progress Notes (Signed)
Subjective: Occasional abdominal pain.  Objective: Vital signs in last 24 hours: Temp:  [97.6 F (36.4 C)-98.8 F (37.1 C)] 97.6 F (36.4 C) (05/13 1331) Pulse Rate:  [71-84] 72 (05/13 1331) Resp:  [16-18] 16 (05/13 1331) BP: (111-122)/(59-70) 111/59 (05/13 1331) SpO2:  [97 %-99 %] 97 % (05/13 1331) Weight change:  Last BM Date: 02/03/20  PE: GEN:  NAD ABD:  Soft, mild epigastric tenderness  Lab Results: CBC    Component Value Date/Time   WBC 7.1 02/04/2020 0428   RBC 3.97 02/04/2020 0428   HGB 12.7 02/04/2020 0428   HCT 38.0 02/04/2020 0428   PLT 210 02/04/2020 0428   MCV 95.7 02/04/2020 0428   MCH 32.0 02/04/2020 0428   MCHC 33.4 02/04/2020 0428   RDW 13.1 02/04/2020 0428   LYMPHSABS 3.1 01/11/2020 0953   MONOABS 0.9 01/11/2020 0953   EOSABS 0.1 01/11/2020 0953   BASOSABS 0.0 01/11/2020 0953   CMP     Component Value Date/Time   NA 141 02/04/2020 1202   K 3.8 02/04/2020 1202   CL 110 02/04/2020 1202   CO2 24 02/04/2020 1202   GLUCOSE 87 02/04/2020 1202   BUN <5 (L) 02/04/2020 1202   CREATININE 0.54 02/04/2020 1202   CREATININE 0.53 06/23/2015 1124   CALCIUM 8.7 (L) 02/04/2020 1202   PROT 7.0 02/04/2020 1202   ALBUMIN 3.7 02/04/2020 1202   AST 108 (H) 02/04/2020 1202   ALT 254 (H) 02/04/2020 1202   ALKPHOS 158 (H) 02/04/2020 1202   BILITOT 4.0 (H) 02/04/2020 1202   GFRNONAA >60 02/04/2020 1202   GFRAA >60 02/04/2020 1202   MRCP:  Mild pancreatitis, no cbd stones, gallstones seen  Assessment:  1.  Gallstone pancreatitis, improving. 2.  Elevated liver tests. 3.  No evidence CBD stones.  Plan:  1.  No indication for ERCP based on MRCP findings. 2.  Defer timing of cholecystectomy to surgical team. 3.  [facility] will follow at a distance.   Landry Dyke 02/04/2020, 1:53 PM   Cell [phone] If no answer or after 5 PM call [phone]

## 2020-02-04 NOTE — Progress Notes (Addendum)
PROGRESS NOTE    Paula Warner  MHD:[id number] DOB: 05-25-1986 DOA: 02/02/2020 PCP: Patient, No Pcp Per    Chief Complaint  Patient presents with  . Abdominal Pain  . Nausea  . Emesis    Brief Narrative:   Paula Habig Wilsonis a 34 y.o.femalewith medical history significant ofprior DVT.  Pt with known chronic cholecystitis with calculous, recent biliary colic and elevated transaminases. Surgery saw pt on 5/4, planning for Lap chole on 5/19. MRCP done just last week without any obvious CBD stones.  Presents to ED today with worsening of abd pain. Epigastric abdomen, radiates to back, nausea with 2-3 episodes of vomiting.  The patient has been admitted to a medical bed. She is on a clear liquid diet wth pain control and antiemetics.Gastroenterology and General Surgery have been consulted. GI has decided to take a wait and see approach before proceeding with MRCP. They recommend to proceed if her LFT's are increased tomorrow. General surgery plans for lap chole with IOC once pancreatitis has resolved.  Subjective: She denies pain, no nausea no vomiting today, she is anxious and wanted to have surgery done as soon as possible  Assessment & Plan:   Principal Problem:   Acute gallstone pancreatitis   Acute gallstone pancreatitis: Gastroenterology and General surgery consulted No indication for ERCP based on MRCP findings Lipase is trending down LFT improving, T bili come down from 4.6- to 4 Timing of cholecystectomy per surgical team, general surgery okay with clear liquid diet today Recommend keep n.p.o. after midnight in case surgery can be done tomorrow  case discussed with general surgery  will change normal saline to D5 LR  Hypokalemia, replaced, improved, check mag     DVT prophylaxis: Has been on Lovenox since admission, will hold in anticipation for surgery, will order SCDs Code Status: Family Communication:  Disposition:   Status is: Inpatient     Dispo: The patient is from: Home              Anticipated d/c is to: Home              Anticipated d/c date is: 1 to 2 days              Patient currently needing surgery  Consultants:   GI  General surgery  Procedures:   Plan for cholecystectomy  Antimicrobials:   None     Objective: Vitals:   02/03/20 1400 02/03/20 2106 02/04/20 0604 02/04/20 1331  BP: 122/62 114/70 112/69 (!) 111/59  Pulse: 80 84 71 72  Resp: 18 18 18 16   Temp: 98.8 F (37.1 C) 98.6 F (37 C) 98.7 F (37.1 C) 97.6 F (36.4 C)  TempSrc: Oral Oral Oral   SpO2: 99% 99% 97% 97%  Weight:      Height:        Intake/Output Summary (Last 24 hours) at 02/04/2020 1537 Last data filed at 02/04/2020 1350 Gross per 24 hour  Intake 2015.46 ml  Output 5000 ml  Net -2984.54 ml   Filed Weights   02/02/20 2300  Weight: 70.7 kg    Examination:  General exam: calm, NAD Respiratory system: Clear to auscultation. Respiratory effort normal. Cardiovascular system: S1 & S2 heard, RRR. No JVD, no murmur, No pedal edema. Gastrointestinal system: Abdomen is nondistended, soft and nontender. No organomegaly or masses felt. Normal bowel sounds heard. Central nervous system: Alert and oriented. No focal neurological deficits. Extremities: Symmetric 5 x 5 power. Skin: No rashes, lesions or ulcers  Psychiatry: Judgement and insight appear normal. Mood & affect appropriate.     Data Reviewed: I have personally reviewed following labs and imaging studies  CBC: Recent Labs  Lab 02/02/20 1637 02/03/20 0420 02/04/20 0428  WBC 8.9 7.8 7.1  HGB 14.5 13.3 12.7  HCT 43.1 39.6 38.0  MCV 94.5 92.7 95.7  PLT 236 213 914    Basic Metabolic Panel: Recent Labs  Lab 02/02/20 1637 02/03/20 0420 02/04/20 0428 02/04/20 1202  NA 142 142 137 141  K 3.7 3.3* 3.6 3.8  CL 105 109 110 110  CO2 29 24 23 24   GLUCOSE 113* 98 85 87  BUN 10 11 5* <5*  CREATININE 0.64 0.49 0.54 0.54  CALCIUM 9.2 8.7* 8.3* 8.7*     GFR: Estimated Creatinine Clearance: 91.7 mL/min (by C-G formula based on SCr of 0.54 mg/dL).  Liver Function Tests: Recent Labs  Lab 02/02/20 1637 02/03/20 0420 02/04/20 0428 02/04/20 1202  AST 453* 300* 128* 108*  ALT 385* 363* 261* 254*  ALKPHOS 139* 135* 145* 158*  BILITOT 3.3* 4.6* 4.1* 4.0*  PROT 7.9 7.0 6.2* 7.0  ALBUMIN 4.3 3.7 3.2* 3.7    CBG: No results for input(s): GLUCAP in the last 168 hours.   Recent Results (from the past 240 hour(s))  SARS Coronavirus 2 by RT PCR (hospital order, performed in [facility] hospital lab) Nasopharyngeal Nasopharyngeal Swab     Status: None   Collection Time: 02/02/20  7:42 PM   Specimen: Nasopharyngeal Swab  Result Value Ref Range Status   SARS Coronavirus 2 NEGATIVE NEGATIVE Final    Comment: (NOTE) SARS-CoV-2 target nucleic acids are NOT DETECTED. The SARS-CoV-2 RNA is generally detectable in upper and lower respiratory specimens during the acute phase of infection. The lowest concentration of SARS-CoV-2 viral copies this assay can detect is 250 copies / mL. A negative result does not preclude SARS-CoV-2 infection and should not be used as the sole basis for treatment or other patient management decisions.  A negative result may occur with improper specimen collection / handling, submission of specimen other than nasopharyngeal swab, presence of viral mutation(s) within the areas targeted by this assay, and inadequate number of viral copies (<250 copies / mL). A negative result must be combined with clinical observations, patient history, and epidemiological information. Fact Sheet for Patients:   StrictlyIdeas.no Fact Sheet for Healthcare Providers: BankingDealers.co.za This test is not yet approved or cleared  by the [place] FDA and has been authorized for detection and/or diagnosis of SARS-CoV-2 by FDA under an Emergency Use Authorization (EUA).  This EUA will  remain in effect (meaning this test can be used) for the duration of the COVID-19 declaration under Section 564(b)(1) of the Act, 21 U.S.C. section 360bbb-3(b)(1), unless the authorization is terminated or revoked sooner. Performed at [facility], [place] [address] [place], [place] [address]   Surgical pcr screen     Status: None   Collection Time: 02/03/20 10:08 PM   Specimen: Nasal Mucosa; Nasal Swab  Result Value Ref Range Status   MRSA, PCR NEGATIVE NEGATIVE Final   Staphylococcus aureus NEGATIVE NEGATIVE Final    Comment: (NOTE) The Xpert SA Assay (FDA approved for NASAL specimens in patients 57 years of age and older), is one component of a comprehensive surveillance program. It is not intended to diagnose infection nor to guide or monitor treatment. Performed at [facility], [place] [address] [place], [place] [address]  Radiology Studies: CT Abdomen Pelvis W Contrast  Result Date: 02/02/2020 CLINICAL DATA:  Upper abdominal pain with nausea and vomiting for several weeks, history of cholelithiasis EXAM: CT ABDOMEN AND PELVIS WITH CONTRAST TECHNIQUE: Multidetector CT imaging of the abdomen and pelvis was performed using the standard protocol following bolus administration of intravenous contrast. CONTRAST:  121mL OMNIPAQUE IOHEXOL 300 MG/ML  SOLN COMPARISON:  04/06/2019 FINDINGS: Lower chest: Mild scarring in the bases bilaterally. No focal infiltrate or sizable effusion is seen. Hepatobiliary: Fatty infiltration of the liver is noted. The gallbladder is again well visualized with multiple faceted gallstones within. No biliary ductal dilatation is seen. Pancreas: Mild peripancreatic inflammatory changes are noted in the region of the head and uncinate process consistent with focal pancreatitis. Spleen: Normal in size without focal abnormality. Adrenals/Urinary Tract: Adrenal glands are within normal limits. Kidneys demonstrate a normal  enhancement pattern. Few scattered small cysts are noted. Scattered small renal calculi are noted as well similar to that seen on the prior exam. The largest of these lies in the upper pole of the right kidney measuring 2 mm. Bladder is within normal limits. Stomach/Bowel: The appendix is within normal limits. No obstructive or inflammatory changes of the colon are seen. Small bowel and stomach are unremarkable. Vascular/Lymphatic: No significant vascular findings are present. No enlarged abdominal or pelvic lymph nodes. Left iliac venous stent is noted. Reproductive: Uterus and bilateral adnexa are unremarkable. Changes of prior tubal ligation are seen Other: No abdominal wall hernia or abnormality. No abdominopelvic ascites. Musculoskeletal: No acute or significant osseous findings. IMPRESSION: Changes of mild acute pancreatitis surrounding pancreatic head and uncinate process. Cholelithiasis without complicating factors. Bilateral small renal calculi without obstructive change. Electronically Signed   By: Inez Catalina M.D.   On: 02/02/2020 21:13   MR ABDOMEN MRCP WO CONTRAST  Result Date: 02/04/2020 CLINICAL DATA:  Cholelithiasis, suspected pancreatitis EXAM: MRI ABDOMEN WITHOUT CONTRAST  (INCLUDING MRCP) TECHNIQUE: Multiplanar multisequence MR imaging of the abdomen was performed. Heavily T2-weighted images of the biliary and pancreatic ducts were obtained, and three-dimensional MRCP images were rendered by post processing. COMPARISON:  CT abdomen and pelvis 02/02/2020 FINDINGS: Lower chest: Incidental imaging of the lung bases with signs of basilar atelectasis or airspace disease on the LEFT not well evaluated on MR. Hepatobiliary: Liver is unremarkable. Numerous gallstones in the gallbladder without pericholecystic stranding. No biliary ductal dilation. Assessment of the biliary tree is limited due to motion on MRCP images. Radial thick slab MRCP sequences without sign of filling defect in the common bile  duct. Pancreas: Pancreas with minimal peripancreatic edema about the head and uncinate process. No ductal dilation. No sign of pancreatic lesion. Spleen:  Spleen is normal size without focal lesion. Adrenals/Urinary Tract: Adrenal glands are normal. Renal contours are smooth without signs of hydronephrosis or renal lesion. Stomach/Bowel: Gastrointestinal tract is unremarkable to the extent visualized with limited assessment on MRI. Vascular/Lymphatic: Vascular structures in the abdomen are grossly patent. Other:  No ascites. Musculoskeletal: No suspicious bone lesions identified. IMPRESSION: 1. Mild peripancreatic edema about the head and uncinate process, no fluid collection or change in T1 signal of pancreatic parenchyma beyond subtle decreased signal in the area of edema findings suggest mild acute interstitial pancreatitis. 2. Assessment of the biliary tree is limited due to motion on MRCP images. No sign of filling defect in the common bile duct. No biliary ductal dilation. 3. Cholelithiasis without current signs of acute cholecystitis. Study limited by lack of intravenous contrast. 4. Incidental signs of  basilar atelectasis or airspace disease in the LEFT lung base not well evaluated on MR. Electronically Signed   By: Zetta Bills M.D.   On: 02/04/2020 11:40   MR 3D Recon At Scanner  Result Date: 02/04/2020 CLINICAL DATA:  Cholelithiasis, suspected pancreatitis EXAM: MRI ABDOMEN WITHOUT CONTRAST  (INCLUDING MRCP) TECHNIQUE: Multiplanar multisequence MR imaging of the abdomen was performed. Heavily T2-weighted images of the biliary and pancreatic ducts were obtained, and three-dimensional MRCP images were rendered by post processing. COMPARISON:  CT abdomen and pelvis 02/02/2020 FINDINGS: Lower chest: Incidental imaging of the lung bases with signs of basilar atelectasis or airspace disease on the LEFT not well evaluated on MR. Hepatobiliary: Liver is unremarkable. Numerous gallstones in the gallbladder  without pericholecystic stranding. No biliary ductal dilation. Assessment of the biliary tree is limited due to motion on MRCP images. Radial thick slab MRCP sequences without sign of filling defect in the common bile duct. Pancreas: Pancreas with minimal peripancreatic edema about the head and uncinate process. No ductal dilation. No sign of pancreatic lesion. Spleen:  Spleen is normal size without focal lesion. Adrenals/Urinary Tract: Adrenal glands are normal. Renal contours are smooth without signs of hydronephrosis or renal lesion. Stomach/Bowel: Gastrointestinal tract is unremarkable to the extent visualized with limited assessment on MRI. Vascular/Lymphatic: Vascular structures in the abdomen are grossly patent. Other:  No ascites. Musculoskeletal: No suspicious bone lesions identified. IMPRESSION: 1. Mild peripancreatic edema about the head and uncinate process, no fluid collection or change in T1 signal of pancreatic parenchyma beyond subtle decreased signal in the area of edema findings suggest mild acute interstitial pancreatitis. 2. Assessment of the biliary tree is limited due to motion on MRCP images. No sign of filling defect in the common bile duct. No biliary ductal dilation. 3. Cholelithiasis without current signs of acute cholecystitis. Study limited by lack of intravenous contrast. 4. Incidental signs of basilar atelectasis or airspace disease in the LEFT lung base not well evaluated on MR. Electronically Signed   By: Zetta Bills M.D.   On: 02/04/2020 11:40        Scheduled Meds: . acetaminophen  1,000 mg Oral On Call to OR  . bupivacaine liposome  20 mL Infiltration On Call to OR  . celecoxib  200 mg Oral On Call to OR  . Chlorhexidine Gluconate Cloth  6 each Topical Once  . enoxaparin (LOVENOX) injection  40 mg Subcutaneous Q24H  . gabapentin  300 mg Oral On Call to OR  . sodium chloride flush  3 mL Intravenous Once   Continuous Infusions: . sodium chloride 125 mL/hr at  02/04/20 0655  . clindamycin (CLEOCIN) IV     And  . gentamicin       LOS: 2 days     Time spent: 25mins I have personally reviewed and interpreted on  02/04/2020 daily labs,  imagings as discussed above under date review session and assessment and plans.  I reviewed all nursing notes, pharmacy notes, consultant notes,  vitals, pertinent old records  I have discussed plan of care as described above with patient on 02/04/2020  Voice Recognition /Dragon dictation system was used to create this note, attempts have been made to correct errors. Please contact the author with questions and/or clarifications.   Florencia Reasons, MD PhD FACP [facility]  Available via Epic secure chat 7am-7pm for nonurgent issues Please page for urgent issues To page the attending provider between 7A-7P or the covering provider during after hours 7P-7A, please log into  the web site [email or web address] and access using universal [place] password for that web site. If you do not have the password, please call the hospital operator.    02/04/2020, 3:37 PM

## 2020-02-05 ENCOUNTER — Encounter (HOSPITAL_COMMUNITY)
Admission: EM | Disposition: A | Discharge status: Home / Self Care | Payer: Self-pay | Source: Home / Self Care | Attending: Internal Medicine

## 2020-02-05 ENCOUNTER — Inpatient Hospital Stay (HOSPITAL_COMMUNITY): Payer: 59 | Admitting: Certified Registered Nurse Anesthetist

## 2020-02-05 ENCOUNTER — Inpatient Hospital Stay (HOSPITAL_COMMUNITY): Payer: 59

## 2020-02-05 HISTORY — PX: CHOLECYSTECTOMY: SHX55

## 2020-02-05 LAB — COMPREHENSIVE METABOLIC PANEL
ALT: 196 U/L — ABNORMAL HIGH (ref 0–44)
AST: 66 U/L — ABNORMAL HIGH (ref 15–41)
Albumin: 3.4 g/dL — ABNORMAL LOW (ref 3.5–5.0)
Alkaline Phosphatase: 141 U/L — ABNORMAL HIGH (ref 38–126)
Anion gap: 5 (ref 5–15)
BUN: <5 mg/dL — ABNORMAL LOW (ref 6–20)
CO2: 27 mmol/L (ref 22–32)
Calcium: 8.8 mg/dL — ABNORMAL LOW (ref 8.9–10.3)
Chloride: 107 mmol/L (ref 98–111)
Creatinine, Ser: 0.6 mg/dL (ref 0.44–1.00)
GFR calc Af Amer: >60 mL/min (ref >60)
GFR calc non Af Amer: >60 mL/min (ref >60)
Glucose, Bld: 108 mg/dL — ABNORMAL HIGH (ref 70–99)
Potassium: 3.7 mmol/L (ref 3.5–5.1)
Sodium: 139 mmol/L (ref 135–145)
Total Bilirubin: 2.9 mg/dL — ABNORMAL HIGH (ref 0.3–1.2)
Total Protein: 6.5 g/dL (ref 6.5–8.1)

## 2020-02-05 LAB — LIPASE, BLOOD: Lipase: 29 U/L (ref 11–51)

## 2020-02-05 LAB — CBC
HCT: 39.5 % (ref 36.0–46.0)
Hemoglobin: 13 g/dL (ref 12.0–15.0)
MCH: 31.6 pg (ref 26.0–34.0)
MCHC: 32.9 g/dL (ref 30.0–36.0)
MCV: 95.9 fL (ref 80.0–100.0)
Platelets: 188 10*3/uL (ref 150–400)
RBC: 4.12 MIL/uL (ref 3.87–5.11)
RDW: 13.2 % (ref 11.5–15.5)
WBC: 7.9 10*3/uL (ref 4.0–10.5)
nRBC: 0 % (ref 0.0–0.2)

## 2020-02-05 LAB — CBC WITH DIFFERENTIAL/PLATELET
Abs Immature Granulocytes: 0.05 10*3/uL (ref 0.00–0.07)
Basophils Absolute: 0 10*3/uL (ref 0.0–0.1)
Basophils Relative: 0 %
Eosinophils Absolute: 0 10*3/uL (ref 0.0–0.5)
Eosinophils Relative: 0 %
HCT: 42.1 % (ref 36.0–46.0)
Hemoglobin: 13.9 g/dL (ref 12.0–15.0)
Immature Granulocytes: 0 %
Lymphocytes Relative: 6 %
Lymphs Abs: 0.8 10*3/uL (ref 0.7–4.0)
MCH: 31 pg (ref 26.0–34.0)
MCHC: 33 g/dL (ref 30.0–36.0)
MCV: 94 fL (ref 80.0–100.0)
Monocytes Absolute: 0.3 10*3/uL (ref 0.1–1.0)
Monocytes Relative: 2 %
Neutro Abs: 10.8 10*3/uL — ABNORMAL HIGH (ref 1.7–7.7)
Neutrophils Relative %: 92 %
Platelets: 206 10*3/uL (ref 150–400)
RBC: 4.48 MIL/uL (ref 3.87–5.11)
RDW: 13.2 % (ref 11.5–15.5)
WBC: 11.9 10*3/uL — ABNORMAL HIGH (ref 4.0–10.5)
nRBC: 0 % (ref 0.0–0.2)

## 2020-02-05 LAB — MAGNESIUM: Magnesium: 1.9 mg/dL (ref 1.7–2.4)

## 2020-02-05 IMAGING — RF DG CHOLANGIOGRAM OPERATIVE
1 series · 6 of 6 positions shown · non-contrast
Comparison: MRCP-[DATE]

CLINICAL DATA: Intraoperative cholangiogram during laparoscopic
cholecystectomy.

EXAM:
INTRAOPERATIVE CHOLANGIOGRAM
FLUOROSCOPY TIME:  17 seconds

[Series 1: run · 3 acquisitions, 6 frames shown]
[im 1/3]
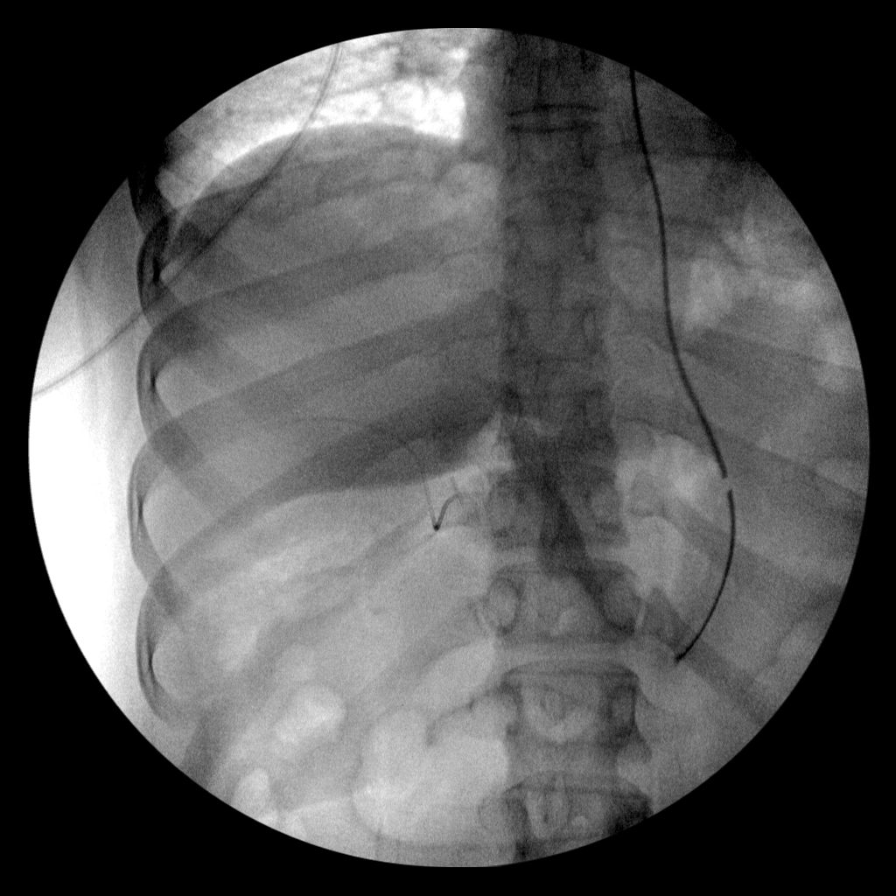
[im 2/3]
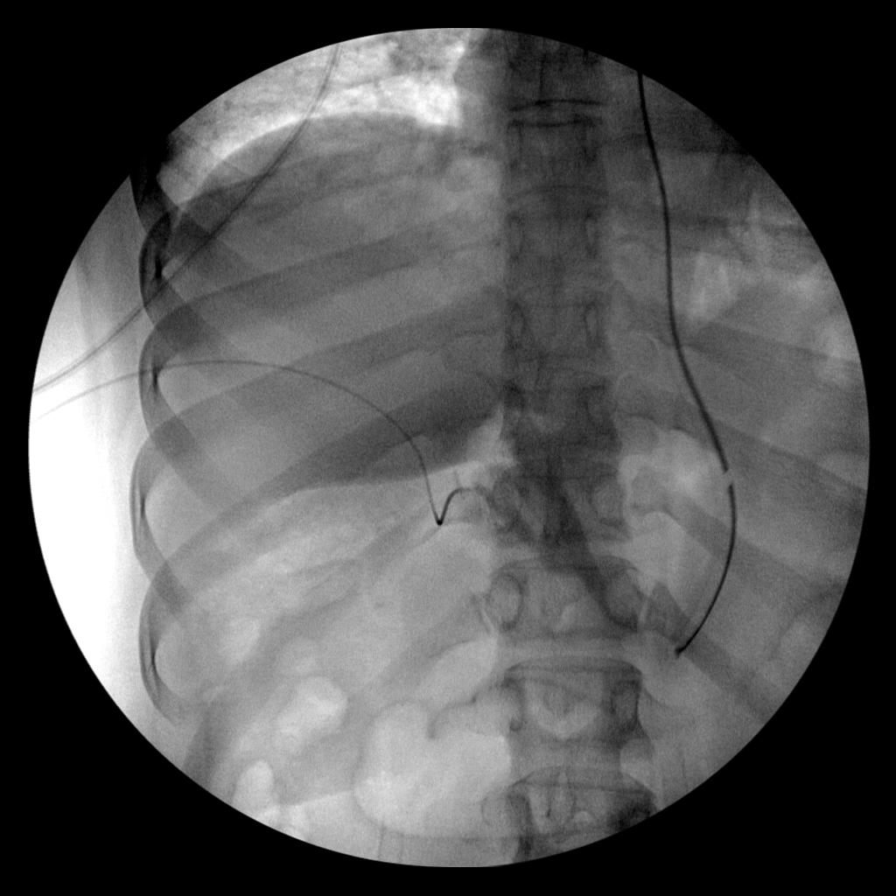
[im 2/3]
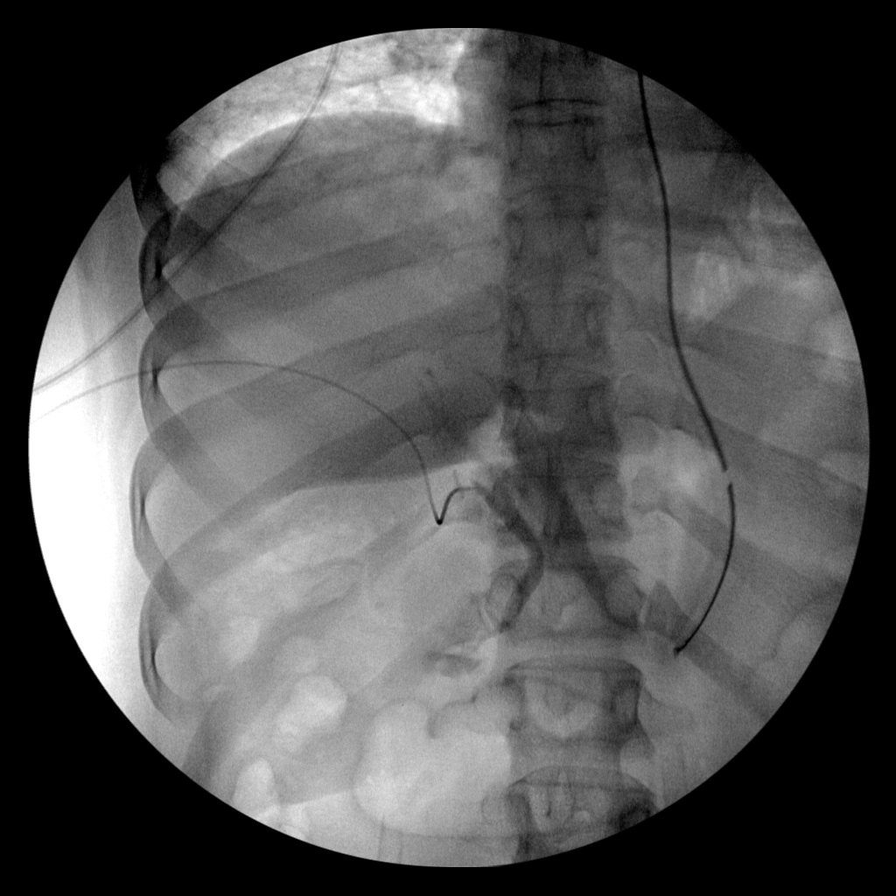
[im 2/3]
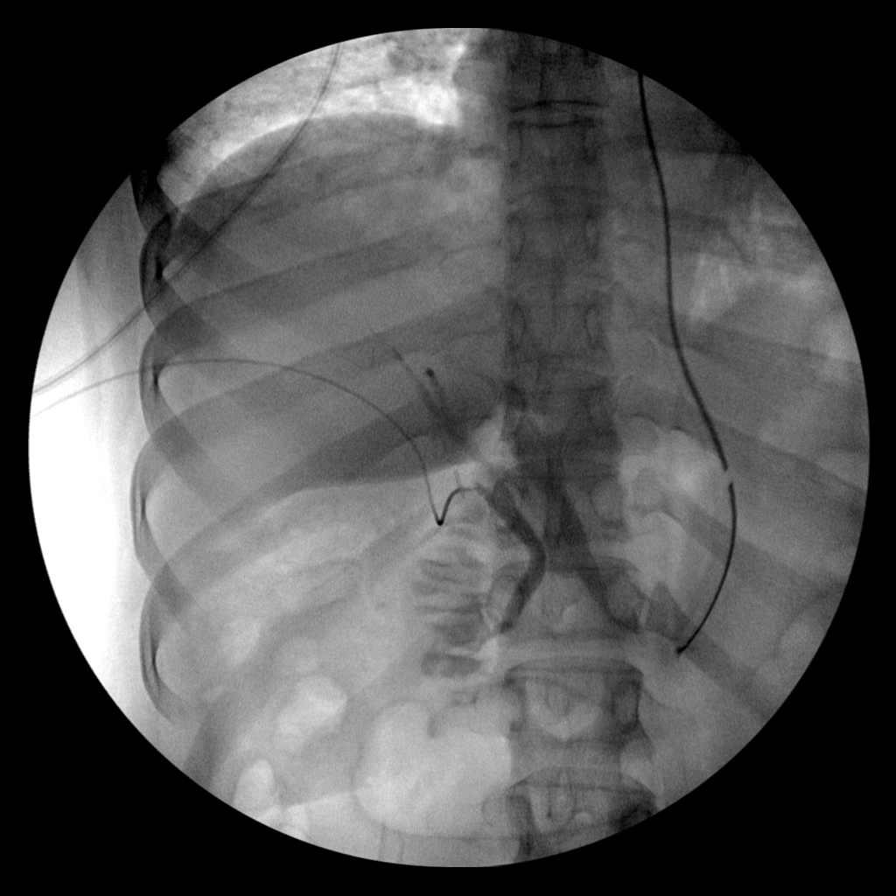
[im 2/3]
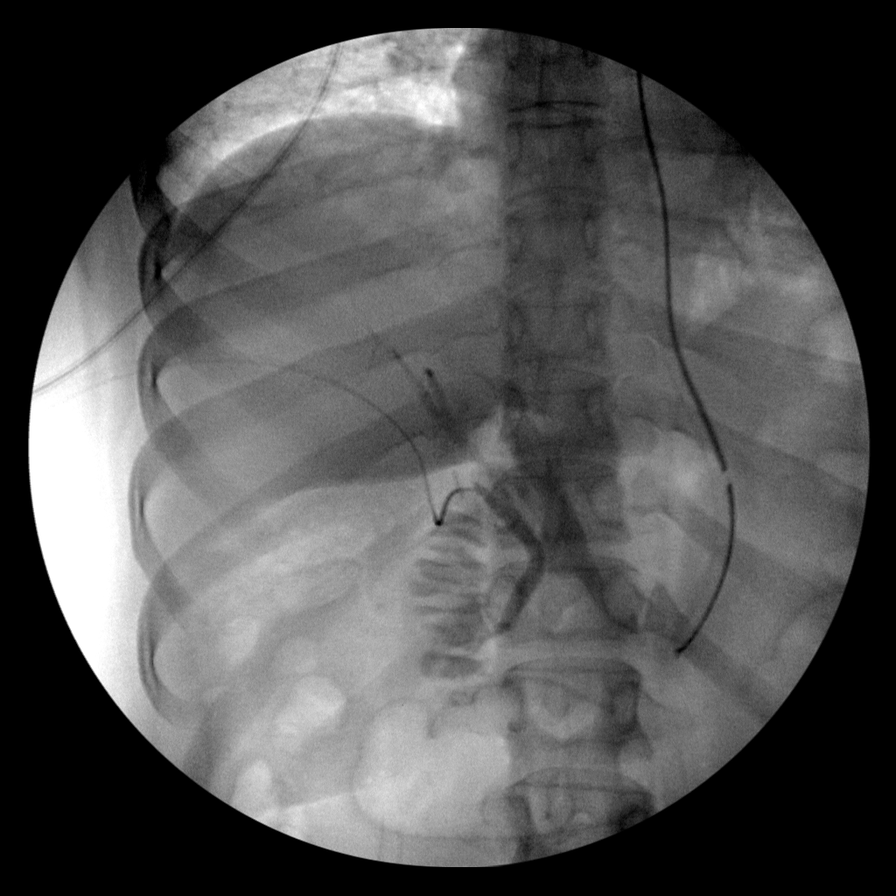
[im 3/3]
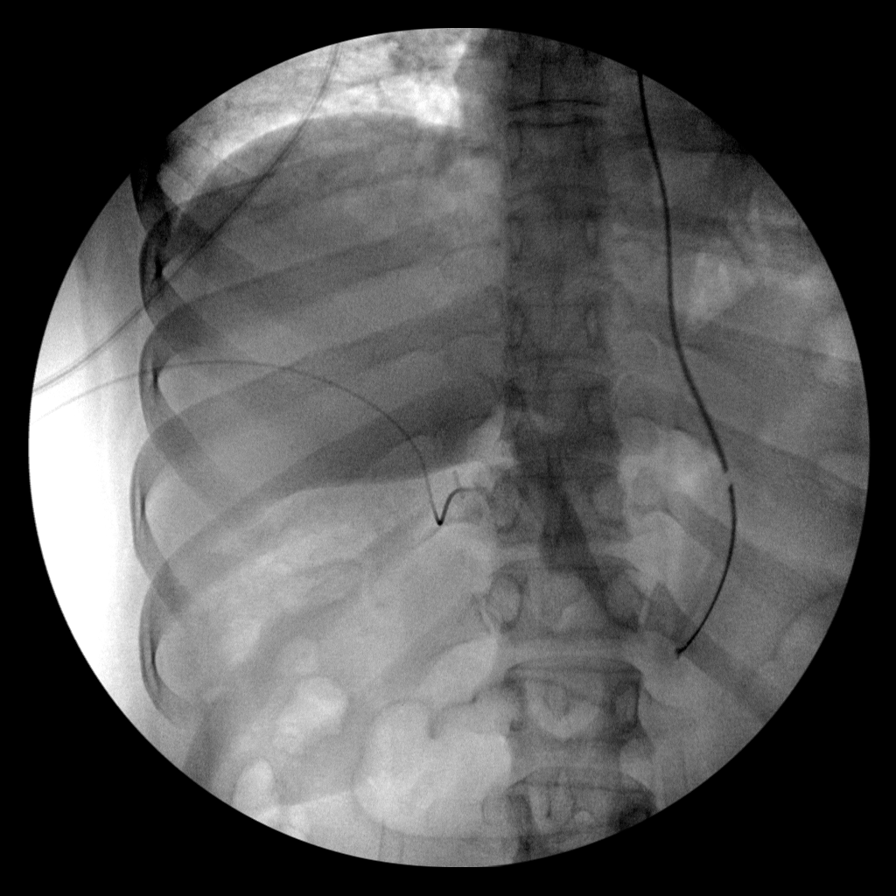

[6 of 6 positions shown; findings below may reference images not displayed]

FINDINGS: Intraoperative cholangiographic images of the right upper abdominal
quadrant during laparoscopic cholecystectomy are provided for
review.

Surgical clips overlie the expected location of the gallbladder
fossa. Enteric tube tip and side port project over the expected
location of mid body of the stomach.

Contrast injection demonstrates selective cannulation of the central
aspect of the cystic duct.

There is passage of contrast through the central aspect of the
cystic duct with filling of a non dilated common bile duct. There is
passage of contrast though the CBD and into the descending portion
of the duodenum.

There is minimal reflux of injected contrast into the common hepatic
duct and central aspect of the non dilated intrahepatic biliary
system.

There are no discrete filling defects within the opacified portions
of the biliary system to suggest the presence of
choledocholithiasis.
IMPRESSION: No evidence of choledocholithiasis.

## 2020-02-05 SURGERY — LAPAROSCOPIC CHOLECYSTECTOMY WITH INTRAOPERATIVE CHOLANGIOGRAM
Anesthesia: General

## 2020-02-05 MED ORDER — CELECOXIB 200 MG PO CAPS
200.0000 mg | ORAL_CAPSULE | ORAL | Status: AC
  Administered 2020-02-05: 200 mg via ORAL

## 2020-02-05 MED ORDER — PROPOFOL 10 MG/ML IV BOLUS
INTRAVENOUS | Status: DC | PRN
  Administered 2020-02-05: 150 mg via INTRAVENOUS
  Administered 2020-02-05: 50 mg via INTRAVENOUS

## 2020-02-05 MED ORDER — PROCHLORPERAZINE EDISYLATE 10 MG/2ML IJ SOLN: 5.0000 mg | INTRAMUSCULAR | Status: DC | PRN

## 2020-02-05 MED ORDER — LACTATED RINGERS IV BOLUS: 1000.0000 mL | Freq: Three times a day (TID) | INTRAVENOUS | Status: DC | PRN

## 2020-02-05 MED ORDER — LACTATED RINGERS IV SOLN: INTRAVENOUS | Status: DC

## 2020-02-05 MED ORDER — MIDAZOLAM HCL 5 MG/5ML IJ SOLN
INTRAMUSCULAR | Status: DC | PRN
  Administered 2020-02-05: 2 mg via INTRAVENOUS

## 2020-02-05 MED ORDER — DEXAMETHASONE SODIUM PHOSPHATE 10 MG/ML IJ SOLN
INTRAMUSCULAR | Status: DC | PRN
  Administered 2020-02-05: 5 mg via INTRAVENOUS

## 2020-02-05 MED ORDER — HYDROMORPHONE HCL 1 MG/ML IJ SOLN
0.5000 mg | INTRAMUSCULAR | Status: DC | PRN
  Administered 2020-02-05: 1 mg via INTRAVENOUS
  Filled 2020-02-05: qty 1

## 2020-02-05 MED ORDER — PHENYLEPHRINE 40 MCG/ML (10ML) SYRINGE FOR IV PUSH (FOR BLOOD PRESSURE SUPPORT)
PREFILLED_SYRINGE | INTRAVENOUS | Status: DC | PRN
  Administered 2020-02-05 (×2): 80 ug via INTRAVENOUS
  Administered 2020-02-05: 120 ug via INTRAVENOUS
  Administered 2020-02-05: 40 ug via INTRAVENOUS
  Administered 2020-02-05: 80 ug via INTRAVENOUS

## 2020-02-05 MED ORDER — LACTATED RINGERS IR SOLN
Status: DC | PRN
  Administered 2020-02-05: 3000 mL

## 2020-02-05 MED ORDER — PROMETHAZINE HCL 25 MG/ML IJ SOLN
INTRAMUSCULAR | Status: AC
  Filled 2020-02-05: qty 1

## 2020-02-05 MED ORDER — LIP MEDEX EX OINT
1.0000 "application " | TOPICAL_OINTMENT | Freq: Two times a day (BID) | CUTANEOUS | Status: DC
  Administered 2020-02-05 – 2020-02-06 (×2): 1 via TOPICAL
  Filled 2020-02-05: qty 7

## 2020-02-05 MED ORDER — BUPIVACAINE-EPINEPHRINE (PF) 0.5% -1:200000 IJ SOLN
INTRAMUSCULAR | Status: DC | PRN
  Administered 2020-02-05: 30 mL via PERINEURAL

## 2020-02-05 MED ORDER — DEXAMETHASONE SODIUM PHOSPHATE 10 MG/ML IJ SOLN
INTRAMUSCULAR | Status: AC
  Filled 2020-02-05: qty 1

## 2020-02-05 MED ORDER — PROPOFOL 10 MG/ML IV BOLUS
INTRAVENOUS | Status: AC
  Filled 2020-02-05: qty 20

## 2020-02-05 MED ORDER — BISMUTH SUBSALICYLATE 262 MG/15ML PO SUSP
30.0000 mL | Freq: Four times a day (QID) | ORAL | Status: DC | PRN
  Filled 2020-02-05: qty 236

## 2020-02-05 MED ORDER — LIDOCAINE 2% (20 MG/ML) 5 ML SYRINGE
INTRAMUSCULAR | Status: DC | PRN
  Administered 2020-02-05: 60 mg via INTRAVENOUS

## 2020-02-05 MED ORDER — IBUPROFEN 400 MG PO TABS
400.0000 mg | ORAL_TABLET | Freq: Four times a day (QID) | ORAL | Status: DC | PRN
  Administered 2020-02-05: 400 mg via ORAL
  Filled 2020-02-05 (×2): qty 1

## 2020-02-05 MED ORDER — PROMETHAZINE HCL 25 MG/ML IJ SOLN
6.2500 mg | INTRAMUSCULAR | Status: DC | PRN
  Administered 2020-02-05: 6.25 mg via INTRAVENOUS

## 2020-02-05 MED ORDER — MIDAZOLAM HCL 2 MG/2ML IJ SOLN
INTRAMUSCULAR | Status: AC
  Filled 2020-02-05: qty 2

## 2020-02-05 MED ORDER — POTASSIUM CHLORIDE CRYS ER 20 MEQ PO TBCR
40.0000 meq | EXTENDED_RELEASE_TABLET | Freq: Once | ORAL | Status: AC
  Administered 2020-02-05: 40 meq via ORAL
  Filled 2020-02-05: qty 2

## 2020-02-05 MED ORDER — PHENYLEPHRINE 40 MCG/ML (10ML) SYRINGE FOR IV PUSH (FOR BLOOD PRESSURE SUPPORT)
PREFILLED_SYRINGE | INTRAVENOUS | Status: AC
  Filled 2020-02-05: qty 10

## 2020-02-05 MED ORDER — ACETAMINOPHEN 500 MG PO TABS
1000.0000 mg | ORAL_TABLET | Freq: Three times a day (TID) | ORAL | Status: DC
  Administered 2020-02-05 – 2020-02-06 (×2): 1000 mg via ORAL
  Filled 2020-02-05 (×2): qty 2

## 2020-02-05 MED ORDER — GABAPENTIN 300 MG PO CAPS
300.0000 mg | ORAL_CAPSULE | ORAL | Status: AC
  Administered 2020-02-05: 300 mg via ORAL

## 2020-02-05 MED ORDER — MAGIC MOUTHWASH
15.0000 mL | Freq: Four times a day (QID) | ORAL | Status: DC | PRN
  Filled 2020-02-05: qty 15

## 2020-02-05 MED ORDER — FENTANYL CITRATE (PF) 100 MCG/2ML IJ SOLN
25.0000 ug | INTRAMUSCULAR | Status: DC | PRN
  Administered 2020-02-05: 50 ug via INTRAVENOUS

## 2020-02-05 MED ORDER — GENTAMICIN SULFATE 40 MG/ML IJ SOLN
350.0000 mg | INTRAVENOUS | Status: AC
  Administered 2020-02-05: 350 mg via INTRAVENOUS
  Filled 2020-02-05: qty 8.75

## 2020-02-05 MED ORDER — BUPIVACAINE-EPINEPHRINE 0.5% -1:200000 IJ SOLN
INTRAMUSCULAR | Status: AC
  Filled 2020-02-05: qty 1

## 2020-02-05 MED ORDER — BUPIVACAINE LIPOSOME 1.3 % IJ SUSP: INTRAMUSCULAR | Status: DC | PRN

## 2020-02-05 MED ORDER — DIPHENHYDRAMINE HCL 50 MG/ML IJ SOLN: 12.5000 mg | Freq: Four times a day (QID) | INTRAMUSCULAR | Status: DC | PRN

## 2020-02-05 MED ORDER — SUGAMMADEX SODIUM 200 MG/2ML IV SOLN
INTRAVENOUS | Status: DC | PRN
  Administered 2020-02-05: 200 mg via INTRAVENOUS

## 2020-02-05 MED ORDER — 0.9 % SODIUM CHLORIDE (POUR BTL) OPTIME
TOPICAL | Status: DC | PRN
  Administered 2020-02-05: 1000 mL

## 2020-02-05 MED ORDER — CLINDAMYCIN PHOSPHATE 900 MG/50ML IV SOLN
900.0000 mg | INTRAVENOUS | Status: AC
  Administered 2020-02-05: 900 mg via INTRAVENOUS

## 2020-02-05 MED ORDER — CIPROFLOXACIN IN D5W 400 MG/200ML IV SOLN: 400.0000 mg | INTRAVENOUS | Status: DC

## 2020-02-05 MED ORDER — LIDOCAINE 2% (20 MG/ML) 5 ML SYRINGE
INTRAMUSCULAR | Status: AC
  Filled 2020-02-05: qty 5

## 2020-02-05 MED ORDER — ACETAMINOPHEN 500 MG PO TABS
1000.0000 mg | ORAL_TABLET | ORAL | Status: AC
  Administered 2020-02-05: 1000 mg via ORAL

## 2020-02-05 MED ORDER — BISACODYL 10 MG RE SUPP: 10.0000 mg | Freq: Two times a day (BID) | RECTAL | Status: DC | PRN

## 2020-02-05 MED ORDER — ROCURONIUM BROMIDE 10 MG/ML (PF) SYRINGE
PREFILLED_SYRINGE | INTRAVENOUS | Status: AC
  Filled 2020-02-05: qty 10

## 2020-02-05 MED ORDER — FENTANYL CITRATE (PF) 100 MCG/2ML IJ SOLN
INTRAMUSCULAR | Status: DC | PRN
  Administered 2020-02-05: 100 ug via INTRAVENOUS
  Administered 2020-02-05 (×2): 50 ug via INTRAVENOUS

## 2020-02-05 MED ORDER — METHOCARBAMOL 500 MG PO TABS: 500.0000 mg | ORAL_TABLET | Freq: Every evening | ORAL | Status: DC | PRN

## 2020-02-05 MED ORDER — BUPIVACAINE HCL 0.25 % IJ SOLN
INTRAMUSCULAR | Status: AC
  Filled 2020-02-05: qty 1

## 2020-02-05 MED ORDER — CHLORHEXIDINE GLUCONATE CLOTH 2 % EX PADS: 6.0000 | MEDICATED_PAD | Freq: Once | CUTANEOUS | Status: DC

## 2020-02-05 MED ORDER — LIDOCAINE 20MG/ML (2%) 15 ML SYRINGE OPTIME
INTRAMUSCULAR | Status: DC | PRN
  Administered 2020-02-05: 1.5 mg/kg/h via INTRAVENOUS

## 2020-02-05 MED ORDER — SODIUM CHLORIDE 0.9 % IV SOLN: 250.0000 mL | INTRAVENOUS | Status: DC | PRN

## 2020-02-05 MED ORDER — ONDANSETRON HCL 4 MG/2ML IJ SOLN
INTRAMUSCULAR | Status: DC | PRN
  Administered 2020-02-05: 4 mg via INTRAVENOUS

## 2020-02-05 MED ORDER — SODIUM CHLORIDE 0.9% FLUSH: 3.0000 mL | INTRAVENOUS | Status: DC | PRN

## 2020-02-05 MED ORDER — SCOPOLAMINE 1 MG/3DAYS TD PT72: 1.0000 | MEDICATED_PATCH | TRANSDERMAL | Status: DC

## 2020-02-05 MED ORDER — FENTANYL CITRATE (PF) 100 MCG/2ML IJ SOLN
INTRAMUSCULAR | Status: AC
  Filled 2020-02-05: qty 2

## 2020-02-05 MED ORDER — ONDANSETRON HCL 4 MG/2ML IJ SOLN
INTRAMUSCULAR | Status: AC
  Filled 2020-02-05: qty 2

## 2020-02-05 MED ORDER — KETAMINE HCL 10 MG/ML IJ SOLN
INTRAMUSCULAR | Status: AC
  Filled 2020-02-05: qty 1

## 2020-02-05 MED ORDER — KETAMINE HCL 10 MG/ML IJ SOLN
INTRAMUSCULAR | Status: DC | PRN
Start: 2020-02-05 — End: 2020-02-05
  Administered 2020-02-05: 25 mg via INTRAVENOUS

## 2020-02-05 MED ORDER — GABAPENTIN 300 MG PO CAPS
300.0000 mg | ORAL_CAPSULE | Freq: Every day | ORAL | Status: DC
  Administered 2020-02-05: 300 mg via ORAL
  Filled 2020-02-05: qty 1

## 2020-02-05 MED ORDER — BUPIVACAINE LIPOSOME 1.3 % IJ SUSP
20.0000 mL | Freq: Once | INTRAMUSCULAR | Status: AC
  Administered 2020-02-05: 20 mL
  Filled 2020-02-05: qty 20

## 2020-02-05 MED ORDER — SODIUM CHLORIDE 0.9% FLUSH
3.0000 mL | Freq: Two times a day (BID) | INTRAVENOUS | Status: DC
  Administered 2020-02-05: 3 mL via INTRAVENOUS

## 2020-02-05 MED ORDER — ROCURONIUM BROMIDE 10 MG/ML (PF) SYRINGE
PREFILLED_SYRINGE | INTRAVENOUS | Status: DC | PRN
  Administered 2020-02-05: 70 mg via INTRAVENOUS

## 2020-02-05 MED ORDER — FENTANYL CITRATE (PF) 250 MCG/5ML IJ SOLN
INTRAMUSCULAR | Status: AC
  Filled 2020-02-05: qty 5

## 2020-02-05 MED ORDER — SCOPOLAMINE 1 MG/3DAYS TD PT72
MEDICATED_PATCH | TRANSDERMAL | Status: AC
  Administered 2020-02-05: 1.5 mg via TRANSDERMAL
  Filled 2020-02-05: qty 1

## 2020-02-05 MED ORDER — OXYCODONE HCL 5 MG PO TABS
5.0000 mg | ORAL_TABLET | ORAL | Status: DC | PRN
  Administered 2020-02-05: 5 mg via ORAL
  Filled 2020-02-05: qty 1

## 2020-02-05 SURGICAL SUPPLY — 41 items
APPLIER CLIP 5 13 M/L LIGAMAX5 (MISCELLANEOUS)
APPLIER CLIP ROT 10 11.4 M/L (STAPLE)
CABLE HIGH FREQUENCY MONO STRZ (ELECTRODE) IMPLANT
CLIP APPLIE 5 13 M/L LIGAMAX5 (MISCELLANEOUS) IMPLANT
CLIP APPLIE ROT 10 11.4 M/L (STAPLE) IMPLANT
COVER MAYO STAND STRL (DRAPES) ×2 IMPLANT
COVER SURGICAL LIGHT HANDLE (MISCELLANEOUS) ×2 IMPLANT
COVER WAND RF STERILE (DRAPES) ×2 IMPLANT
DECANTER SPIKE VIAL GLASS SM (MISCELLANEOUS) ×2 IMPLANT
DRAPE C-ARM 42X120 X-RAY (DRAPES) ×2 IMPLANT
DRAPE UTILITY XL STRL (DRAPES) ×2 IMPLANT
DRAPE WARM FLUID 44X44 (DRAPES) ×2 IMPLANT
DRSG TEGADERM 2-3/8X2-3/4 SM (GAUZE/BANDAGES/DRESSINGS) ×6 IMPLANT
DRSG TEGADERM 4X4.75 (GAUZE/BANDAGES/DRESSINGS) ×1 IMPLANT
ELECT REM PT RETURN 15FT ADLT (MISCELLANEOUS) ×2 IMPLANT
ENDOLOOP SUT PDS II  0 18 (SUTURE) ×1
ENDOLOOP SUT PDS II 0 18 (SUTURE) IMPLANT
GAUZE SPONGE 2X2 8PLY STRL LF (GAUZE/BANDAGES/DRESSINGS) IMPLANT
GLOVE ECLIPSE 8.0 STRL XLNG CF (GLOVE) ×2 IMPLANT
GLOVE INDICATOR 8.0 STRL GRN (GLOVE) ×2 IMPLANT
GOWN STRL REUS W/TWL XL LVL3 (GOWN DISPOSABLE) ×4 IMPLANT
IRRIG SUCT STRYKERFLOW 2 WTIP (MISCELLANEOUS) ×2
IRRIGATION SUCT STRKRFLW 2 WTP (MISCELLANEOUS) ×1 IMPLANT
KIT BASIN (CUSTOM PROCEDURE TRAY) ×2 IMPLANT
KIT TURNOVER KIT A (KITS) IMPLANT
PENCIL SMOKE EVACUATOR (MISCELLANEOUS) IMPLANT
POUCH RETRIEVAL ECOSAC 10 (ENDOMECHANICALS) ×1 IMPLANT
POUCH RETRIEVAL ECOSAC 10MM (ENDOMECHANICALS) ×1
SCISSORS LAP 5X35 DISP (ENDOMECHANICALS) ×2 IMPLANT
SET CHOLANGIOGRAPH MIX (MISCELLANEOUS) ×2 IMPLANT
SET TUBE SMOKE EVAC HIGH FLOW (TUBING) ×2 IMPLANT
SLEEVE XCEL OPT CAN 5 100 (ENDOMECHANICALS) IMPLANT
SPONGE GAUZE 2X2 STER 10/PKG (GAUZE/BANDAGES/DRESSINGS) ×1
SUT MNCRL AB 4-0 PS2 18 (SUTURE) ×2 IMPLANT
SUT PDS AB 1 CT1 27 (SUTURE) ×2 IMPLANT
SYR 20ML LL LF (SYRINGE) ×2 IMPLANT
TOWEL OR 17X26 10 PK STRL BLUE (TOWEL DISPOSABLE) ×2 IMPLANT
TOWEL OR NON WOVEN STRL DISP B (DISPOSABLE) ×2 IMPLANT
TRAY LAPAROSCOPIC (CUSTOM PROCEDURE TRAY) ×2 IMPLANT
TROCAR BLADELESS OPT 5 100 (ENDOMECHANICALS) ×2 IMPLANT
TROCAR XCEL NON-BLD 11X100MML (ENDOMECHANICALS) ×2 IMPLANT

## 2020-02-05 NOTE — Anesthesia Procedure Notes (Signed)
Procedure Name: Intubation Date/Time: 02/05/2020 9:59 AM Performed by: West Pugh, CRNA Pre-anesthesia Checklist: Patient identified, Emergency Drugs available, Suction available, Patient being monitored and Timeout performed Patient Re-evaluated:Patient Re-evaluated prior to induction Oxygen Delivery Method: Circle system utilized Preoxygenation: Pre-oxygenation with 100% oxygen Induction Type: IV induction Ventilation: Mask ventilation without difficulty Laryngoscope Size: Mac and 3 Grade View: Grade I Tube type: Oral Tube size: 7.0 mm Number of attempts: 1 Airway Equipment and Method: Stylet Placement Confirmation: ETT inserted through vocal cords under direct vision,  positive ETCO2,  CO2 detector and breath sounds checked- equal and bilateral Secured at: 22 cm Tube secured with: Tape Dental Injury: Teeth and Oropharynx as per pre-operative assessment

## 2020-02-05 NOTE — Progress Notes (Signed)
PROGRESS NOTE    DANAIJA Warner  DJS:[id number] DOB: November 01, 1985 DOA: 02/02/2020 PCP: Patient, No Pcp Per    Chief Complaint  Patient presents with  . Abdominal Pain  . Nausea  . Emesis    Brief Narrative:   Paula Mihalko Wilsonis a 34 y.o.femalewith medical history significant ofprior DVT.  Pt with known chronic cholecystitis with calculous, recent biliary colic and elevated transaminases. Surgery saw pt on 5/4, planning for Lap chole on 5/19. MRCP done just last week without any obvious CBD stones.  Presents to ED today with worsening of abd pain. Epigastric abdomen, radiates to back, nausea with 2-3 episodes of vomiting.  The patient has been admitted to a medical bed. She is on a clear liquid diet wth pain control and antiemetics.Gastroenterology and General Surgery have been consulted. GI has decided to take a wait and see approach before proceeding with MRCP. They recommend to proceed if her LFT's are increased tomorrow. General surgery plans for lap chole with IOC once pancreatitis has resolved.  Subjective: She is seen after returned from the OR She is still drowsy , reports feeling ok  Assessment & Plan:   Principal Problem:   Acute gallstone pancreatitis   Acute gallstone pancreatitis: Gastroenterology and General surgery consulted No indication for ERCP based on MRCP findings S/p  cholecystectomy on 5/14, diet order per general surgery Hopefully can go home tomorrow  Hypokalemia/hypomagnesemia, remain low, continue to replace,  Recheck in am     DVT prophylaxis:  SCDs Code Status:full Family Communication: patient Disposition:   Status is: Inpatient    Dispo: The patient is from: Home              Anticipated d/c is to: Home              Anticipated d/c date is: 5/14 if able to tolerate diet, with general surgery clearance                Consultants:   GI  General surgery  Procedures:   Cholecystectomy on 5/14  Antimicrobials:     Perioperative cleocin/gentamicin on 5/14     Objective: Vitals:   02/05/20 1215 02/05/20 1230 02/05/20 1245 02/05/20 1313  BP: 136/87 (!) 128/101 (!) 128/93 121/85  Pulse: 73 85 67 70  Resp: 15 17 13 16   Temp:   98.2 F (36.8 C) 97.9 F (36.6 C)  TempSrc:    Oral  SpO2: 100% 95% 99% 99%  Weight:      Height:        Intake/Output Summary (Last 24 hours) at 02/05/2020 1337 Last data filed at 02/05/2020 1245 Gross per 24 hour  Intake 2902.15 ml  Output 1750 ml  Net 1152.15 ml   Filed Weights   02/02/20 2300  Weight: 70.7 kg    Examination:  General exam: calm, NAD Respiratory system: Clear to auscultation. Respiratory effort normal. Cardiovascular system: S1 & S2 heard, RRR. No JVD, no murmur, No pedal edema. Gastrointestinal system: post op changes., hypoactive bowel sounds Central nervous system: Alert and oriented. No focal neurological deficits. Extremities: Symmetric 5 x 5 power. Skin: No rashes, lesions or ulcers Psychiatry: Judgement and insight appear normal. Mood & affect appropriate.     Data Reviewed: I have personally reviewed following labs and imaging studies  CBC: Recent Labs  Lab 02/02/20 1637 02/03/20 0420 02/04/20 0428 02/05/20 0451  WBC 8.9 7.8 7.1 7.9  HGB 14.5 13.3 12.7 13.0  HCT 43.1 39.6 38.0 39.5  MCV  94.5 92.7 95.7 95.9  PLT 236 213 210 188    Basic Metabolic Panel: Recent Labs  Lab 02/02/20 1637 02/03/20 0420 02/04/20 0428 02/04/20 1202 02/05/20 0451  NA 142 142 137 141 139  K 3.7 3.3* 3.6 3.8 3.7  CL 105 109 110 110 107  CO2 29 24 23 24 27   GLUCOSE 113* 98 85 87 108*  BUN 10 11 5* <5* <5*  CREATININE 0.64 0.49 0.54 0.54 0.60  CALCIUM 9.2 8.7* 8.3* 8.7* 8.8*  MG  --   --   --   --  1.9    GFR: Estimated Creatinine Clearance: 91.7 mL/min (by C-G formula based on SCr of 0.6 mg/dL).  Liver Function Tests: Recent Labs  Lab 02/02/20 1637 02/03/20 0420 02/04/20 0428 02/04/20 1202 02/05/20 0451  AST 453* 300*  128* 108* 66*  ALT 385* 363* 261* 254* 196*  ALKPHOS 139* 135* 145* 158* 141*  BILITOT 3.3* 4.6* 4.1* 4.0* 2.9*  PROT 7.9 7.0 6.2* 7.0 6.5  ALBUMIN 4.3 3.7 3.2* 3.7 3.4*    CBG: No results for input(s): GLUCAP in the last 168 hours.   Recent Results (from the past 240 hour(s))  SARS Coronavirus 2 by RT PCR (hospital order, performed in [facility] hospital lab) Nasopharyngeal Nasopharyngeal Swab     Status: None   Collection Time: 02/02/20  7:42 PM   Specimen: Nasopharyngeal Swab  Result Value Ref Range Status   SARS Coronavirus 2 NEGATIVE NEGATIVE Final    Comment: (NOTE) SARS-CoV-2 target nucleic acids are NOT DETECTED. The SARS-CoV-2 RNA is generally detectable in upper and lower respiratory specimens during the acute phase of infection. The lowest concentration of SARS-CoV-2 viral copies this assay can detect is 250 copies / mL. A negative result does not preclude SARS-CoV-2 infection and should not be used as the sole basis for treatment or other patient management decisions.  A negative result may occur with improper specimen collection / handling, submission of specimen other than nasopharyngeal swab, presence of viral mutation(s) within the areas targeted by this assay, and inadequate number of viral copies (<250 copies / mL). A negative result must be combined with clinical observations, patient history, and epidemiological information. Fact Sheet for Patients:   StrictlyIdeas.no Fact Sheet for Healthcare Providers: BankingDealers.co.za This test is not yet approved or cleared  by the [place] FDA and has been authorized for detection and/or diagnosis of SARS-CoV-2 by FDA under an Emergency Use Authorization (EUA).  This EUA will remain in effect (meaning this test can be used) for the duration of the COVID-19 declaration under Section 564(b)(1) of the Act, 21 U.S.C. section 360bbb-3(b)(1), unless the authorization is  terminated or revoked sooner. Performed at [facility], [place] [address], [place], [place] [address]   Surgical pcr screen     Status: None   Collection Time: 02/03/20 10:08 PM   Specimen: Nasal Mucosa; Nasal Swab  Result Value Ref Range Status   MRSA, PCR NEGATIVE NEGATIVE Final   Staphylococcus aureus NEGATIVE NEGATIVE Final    Comment: (NOTE) The Xpert SA Assay (FDA approved for NASAL specimens in patients 61 years of age and older), is one component of a comprehensive surveillance program. It is not intended to diagnose infection nor to guide or monitor treatment. Performed at [facility], [place] [address] [place], [place] [address]          Radiology Studies: DG Cholangiogram Operative  Result Date: 02/05/2020 CLINICAL DATA:  Intraoperative cholangiogram during  laparoscopic cholecystectomy. EXAM: INTRAOPERATIVE CHOLANGIOGRAM FLUOROSCOPY TIME:  17 seconds COMPARISON:  MRCP-04/05/2020 FINDINGS: Intraoperative cholangiographic images of the right upper abdominal quadrant during laparoscopic cholecystectomy are provided for review. Surgical clips overlie the expected location of the gallbladder fossa. Enteric tube tip and side port project over the expected location of mid body of the stomach. Contrast injection demonstrates selective cannulation of the central aspect of the cystic duct. There is passage of contrast through the central aspect of the cystic duct with filling of a non dilated common bile duct. There is passage of contrast though the CBD and into the descending portion of the duodenum. There is minimal reflux of injected contrast into the common hepatic duct and central aspect of the non dilated intrahepatic biliary system. There are no discrete filling defects within the opacified portions of the biliary system to suggest the presence of choledocholithiasis. IMPRESSION: No evidence of choledocholithiasis. Electronically Signed   By:  Sandi Mariscal M.D.   On: 02/05/2020 11:22   MR ABDOMEN MRCP WO CONTRAST  Result Date: 02/04/2020 CLINICAL DATA:  Cholelithiasis, suspected pancreatitis EXAM: MRI ABDOMEN WITHOUT CONTRAST  (INCLUDING MRCP) TECHNIQUE: Multiplanar multisequence MR imaging of the abdomen was performed. Heavily T2-weighted images of the biliary and pancreatic ducts were obtained, and three-dimensional MRCP images were rendered by post processing. COMPARISON:  CT abdomen and pelvis 02/02/2020 FINDINGS: Lower chest: Incidental imaging of the lung bases with signs of basilar atelectasis or airspace disease on the LEFT not well evaluated on MR. Hepatobiliary: Liver is unremarkable. Numerous gallstones in the gallbladder without pericholecystic stranding. No biliary ductal dilation. Assessment of the biliary tree is limited due to motion on MRCP images. Radial thick slab MRCP sequences without sign of filling defect in the common bile duct. Pancreas: Pancreas with minimal peripancreatic edema about the head and uncinate process. No ductal dilation. No sign of pancreatic lesion. Spleen:  Spleen is normal size without focal lesion. Adrenals/Urinary Tract: Adrenal glands are normal. Renal contours are smooth without signs of hydronephrosis or renal lesion. Stomach/Bowel: Gastrointestinal tract is unremarkable to the extent visualized with limited assessment on MRI. Vascular/Lymphatic: Vascular structures in the abdomen are grossly patent. Other:  No ascites. Musculoskeletal: No suspicious bone lesions identified. IMPRESSION: 1. Mild peripancreatic edema about the head and uncinate process, no fluid collection or change in T1 signal of pancreatic parenchyma beyond subtle decreased signal in the area of edema findings suggest mild acute interstitial pancreatitis. 2. Assessment of the biliary tree is limited due to motion on MRCP images. No sign of filling defect in the common bile duct. No biliary ductal dilation. 3. Cholelithiasis without  current signs of acute cholecystitis. Study limited by lack of intravenous contrast. 4. Incidental signs of basilar atelectasis or airspace disease in the LEFT lung base not well evaluated on MR. Electronically Signed   By: Zetta Bills M.D.   On: 02/04/2020 11:40   MR 3D Recon At Scanner  Result Date: 02/04/2020 CLINICAL DATA:  Cholelithiasis, suspected pancreatitis EXAM: MRI ABDOMEN WITHOUT CONTRAST  (INCLUDING MRCP) TECHNIQUE: Multiplanar multisequence MR imaging of the abdomen was performed. Heavily T2-weighted images of the biliary and pancreatic ducts were obtained, and three-dimensional MRCP images were rendered by post processing. COMPARISON:  CT abdomen and pelvis 02/02/2020 FINDINGS: Lower chest: Incidental imaging of the lung bases with signs of basilar atelectasis or airspace disease on the LEFT not well evaluated on MR. Hepatobiliary: Liver is unremarkable. Numerous gallstones in the gallbladder without pericholecystic stranding. No biliary ductal dilation. Assessment of the  biliary tree is limited due to motion on MRCP images. Radial thick slab MRCP sequences without sign of filling defect in the common bile duct. Pancreas: Pancreas with minimal peripancreatic edema about the head and uncinate process. No ductal dilation. No sign of pancreatic lesion. Spleen:  Spleen is normal size without focal lesion. Adrenals/Urinary Tract: Adrenal glands are normal. Renal contours are smooth without signs of hydronephrosis or renal lesion. Stomach/Bowel: Gastrointestinal tract is unremarkable to the extent visualized with limited assessment on MRI. Vascular/Lymphatic: Vascular structures in the abdomen are grossly patent. Other:  No ascites. Musculoskeletal: No suspicious bone lesions identified. IMPRESSION: 1. Mild peripancreatic edema about the head and uncinate process, no fluid collection or change in T1 signal of pancreatic parenchyma beyond subtle decreased signal in the area of edema findings suggest  mild acute interstitial pancreatitis. 2. Assessment of the biliary tree is limited due to motion on MRCP images. No sign of filling defect in the common bile duct. No biliary ductal dilation. 3. Cholelithiasis without current signs of acute cholecystitis. Study limited by lack of intravenous contrast. 4. Incidental signs of basilar atelectasis or airspace disease in the LEFT lung base not well evaluated on MR. Electronically Signed   By: Zetta Bills M.D.   On: 02/04/2020 11:40        Scheduled Meds: . acetaminophen  1,000 mg Oral TID  . Chlorhexidine Gluconate Cloth  6 each Topical Once  . fentaNYL      . gabapentin  300 mg Oral QHS  . lip balm  1 application Topical BID  . potassium chloride  40 mEq Oral Once  . promethazine      . sodium chloride flush  3 mL Intravenous Once  . sodium chloride flush  3 mL Intravenous Q12H   Continuous Infusions: . sodium chloride    . dextrose 5% lactated ringers    . lactated ringers    . lactated ringers       LOS: 3 days     Time spent: 62mins I have personally reviewed and interpreted on  02/05/2020 daily labs,  imagings as discussed above under date review session and assessment and plans.  I reviewed all nursing notes, pharmacy notes, consultant notes,  vitals, pertinent old records  I have discussed plan of care as described above with patient on 02/05/2020  Voice Recognition /Dragon dictation system was used to create this note, attempts have been made to correct errors. Please contact the author with questions and/or clarifications.   Florencia Reasons, MD PhD FACP [facility]  Available via Epic secure chat 7am-7pm for nonurgent issues Please page for urgent issues To page the attending provider between 7A-7P or the covering provider during after hours 7P-7A, please log into the web site [email or web address] and access using universal [place] password for that web site. If you do not have the password, please call the hospital  operator.    02/05/2020, 1:37 PM

## 2020-02-05 NOTE — Plan of Care (Signed)
Patient had cholecystectomy today; IOC without evidence of choledocholithiasis.  [facility] will sign-off; please call us with any questions; patient can follow up with [place] at [facility] on prn basis.

## 2020-02-05 NOTE — Discharge Instructions (Signed)
[facility] ° °Please arrive at least 30 min before your appointment to complete your check in paperwork.  If you are unable to arrive 30 min prior to your appointment time we may have to cancel or reschedule you. °LAPAROSCOPIC SURGERY: POST OP INSTRUCTIONS °Always review your discharge instruction sheet given to you by the facility where your surgery was performed. °IF YOU HAVE DISABILITY OR FAMILY LEAVE FORMS, YOU MUST BRING THEM TO THE OFFICE FOR PROCESSING.   °DO NOT GIVE THEM TO YOUR DOCTOR. ° °PAIN CONTROL ° °1. First take acetaminophen (Tylenol) AND/or ibuprofen (Advil) to control your pain after surgery.  Follow directions on package.  Taking acetaminophen (Tylenol) and/or ibuprofen (Advil) regularly after surgery will help to control your pain and lower the amount of prescription pain medication you may need.  You should not take more than 4,000 mg (4 grams) of acetaminophen (Tylenol) in 24 hours.  You should not take ibuprofen (Advil), aleve, motrin, naprosyn or other NSAIDS if you have a history of stomach ulcers or chronic kidney disease.  °2. A prescription for pain medication may be given to you upon discharge.  Take your pain medication as prescribed, if you still have uncontrolled pain after taking acetaminophen (Tylenol) or ibuprofen (Advil). °3. Use ice packs to help control pain. °4. If you need a refill on your pain medication, please contact your pharmacy.  They will contact our office to request authorization. Prescriptions will not be filled after 5pm or on week-ends. ° °HOME MEDICATIONS °5. Take your usually prescribed medications unless otherwise directed. ° °DIET °6. You should follow a light diet the first few days after arrival home.  Be sure to include lots of fluids daily. Avoid fatty, fried foods.  ° °CONSTIPATION °7. It is common to experience some constipation after surgery and if you are taking pain medication.  Increasing fluid intake and taking a stool  softener (such as Colace) will usually help or prevent this problem from occurring.  A mild laxative (Milk of Magnesia or Miralax) should be taken according to package instructions if there are no bowel movements after 48 hours. ° °WOUND/INCISION CARE °8. Most patients will experience some swelling and bruising in the area of the incisions.  Ice packs will help.  Swelling and bruising can take several days to resolve.  °9. Unless discharge instructions indicate otherwise, follow guidelines below  °a. STERI-STRIPS - you may remove your outer bandages 48 hours after surgery, and you may shower at that time.  You have steri-strips (small skin tapes) in place directly over the incision.  These strips should be left on the skin for 7-10 days.   °b. DERMABOND/SKIN GLUE - you may shower in 24 hours.  The glue will flake off over the next 2-3 weeks. °10. Any sutures or staples will be removed at the office during your follow-up visit. ° °ACTIVITIES °11. You may resume regular (light) daily activities beginning the next day--such as daily self-care, walking, climbing stairs--gradually increasing activities as tolerated.  You may have sexual intercourse when it is comfortable.  Refrain from any heavy lifting or straining until approved by your doctor. °a. You may drive when you are no longer taking prescription pain medication, you can comfortably wear a seatbelt, and you can safely maneuver your car and apply brakes. ° °FOLLOW-UP °12. You should see your doctor in the office for a follow-up appointment approximately 2-3 weeks after your surgery.  You should have been given your post-op/follow-up appointment when   your surgery was scheduled.  If you did not receive a post-op/follow-up appointment, make sure that you call for this appointment within a day or two after you arrive home to insure a convenient appointment time. ° °OTHER INSTRUCTIONS ° °WHEN TO CALL YOUR DOCTOR: °1. Fever over 101.0 °2. Inability to  urinate °3. Continued bleeding from incision. °4. Increased pain, redness, or drainage from the incision. °5. Increasing abdominal pain ° °The clinic staff is available to answer your questions during regular business hours.  Please don’t hesitate to call and ask to speak to one of the nurses for clinical concerns.  If you have a medical emergency, go to the nearest emergency room or call 911.  A surgeon from [facility] is always on call at the hospital. °[address], [address], [place], [place]  [address] ? [address] [address], [place], [place]   [address] °[phone] ? [phone] ? FAX [phone] ° ° ° °

## 2020-02-05 NOTE — Op Note (Signed)
02/05/2020  PATIENT:  Paula Warner  34 y.o. female  Patient Care Team: Patient, No Pcp Per as PCP - General (General Practice)  PRE-OPERATIVE DIAGNOSIS:    Chronic Calculus cholecystitis  Gallstone pancreatitis  POST-OPERATIVE DIAGNOSIS:   Acute on Chronic Calculus Cholecystitis Gallstone pancreatitis Liver: Fatty steatohepatitis  PROCEDURE:  Laparoscopic cholecystectomy with intraoperative cholangiogram  SURGEON:  Adin Hector, MD, FACS.  ASSISTANT: OR Staff   ANESTHESIA:    General with endotracheal intubation Local anesthetic as a field block  EBL:  (See Anesthesia Intraoperative Record) Total I/O In: 233.8 [I.V.:75; IV Piggyback:158.8] Out: -   Delay start of Pharmacological VTE agent (>24hrs) due to surgical blood loss or risk of bleeding:  no  DRAINS: None   SPECIMEN: Gallbladder    DISPOSITION OF SPECIMEN:  PATHOLOGY  COUNTS:  YES  PLAN OF CARE: Admit for overnight observation  PATIENT DISPOSITION:  PACU - hemodynamically stable.  INDICATION: Pleasant woman with abdominal pain found to have pancreatitis and gallstones.  Gallstone pancreatitis suspected.  She has had intermittent elevations for a while.  Had a negative MRCP 3 weeks ago but came in with worsening numbers got another repeat MRCP that was underwhelming.  She is felt better and her numbers are improving.  Therefore we decided proceed with cholecystectomy since it was felt she did not need any ERCP preop  The anatomy & physiology of hepatobiliary & pancreatic function was discussed.  The pathophysiology of gallbladder dysfunction was discussed.  Natural history risks without surgery was discussed.   I feel the risks of no intervention will lead to serious problems that outweigh the operative risks; therefore, I recommended cholecystectomy to remove the pathology.  I explained laparoscopic techniques with possible need for an open approach.  Probable cholangiogram to evaluate the bilary tract  was explained as well.    Risks such as bleeding, infection, abscess, leak, injury to other organs, need for further treatment, heart attack, death, and other risks were discussed.  I noted a good likelihood this will help address the problem.  Possibility that this will not correct all abdominal symptoms was explained.  Goals of post-operative recovery were discussed as well.  We will work to minimize complications.  An educational handout further explaining the pathology and treatment options was given as well.  Questions were answered.  The patient expresses understanding & wishes to proceed with surgery.  OR FINDINGS: Enlarged edematous gallbladder consistent with acute on chronic cholecystitis.  Very numerous stones.  Very fragile but long cystic duct.  Cholangiogram showing classic biliary anatomy without any evidence of any leak or choledocholithiasis.  Liver: Fatty steatohepatitis  DESCRIPTION:   The patient was identified & brought in the operating room. The patient was positioned supine with arms tucked. SCDs were active during the entire case. The patient underwent general anesthesia without any difficulty.  The abdomen was prepped and draped in a sterile fashion. A Surgical Timeout confirmed our plan.  I made a transverse curvilinear incision through the superior umbilical fold.  I placed a 60mm long port through the supraumbilical fascia using a modified Hassan cutdown technique with umbilical stalk fascial countertraction. I began carbon dioxide insufflation.  No change in end tidal CO2 measurement.   Camera inspection revealed no injury. There were no adhesions to the anterior abdominal wall supraumbilically.  I proceeded to continue with single site technique. I placed a #5 port in left upper aspect of the wound. I placed a 5 mm atraumatic grasper in the right  inferior aspect of the wound.  I turned attention to the right upper quadrant.  The gallbladder fundus was elevated cephalad. I  freed adhesions to the ventral surface of the gallbladder off carefully.  Moderate adhesions of greater omentum to the gallbladder as well to the liver itself.  I freed the peritoneal coverings between the gallbladder and the liver on the posteriolateral and anteriomedial walls. I alternated between Harmonic & blunt Maryland dissection to help get a good critical view of the cystic artery and cystic duct.  did further dissection to free 80% of the gallbladder off the liver bed to get a good critical view of the infundibulum and cystic duct. I dissected out the cystic artery; and, after getting a good 360 view, ligated the anterior & posterior branches of the cystic artery close on the infundibulum using the Harmonic ultrasonic dissection.  I skeletonized the cystic duct.  I placed a clip on the infundibulum. I did a partial cystic duct-otomy and ensured patency. I placed a 5 [place] cholangiocatheter through a puncture site at the right subcostal ridge of the abdominal wall and directed it into the cystic duct.  The cystic was very narrow and fragile.  Gallbladder came off it.  I placed an additional port and was able to carefully elevate the cystic duct stump.  Able to direct the catheter into it and placed clip.  And flushed without leak.  We ran a cholangiogram with dilute radio-opaque contrast and continuous fluoroscopy. Contrast flowed from a side branch consistent with cystic duct cannulization. Contrast flowed up the common hepatic duct into the right and left intrahepatic chains out to secondary radicals. Contrast flowed down the common bile duct easily across the normal ampulla into the duodenum.  This was consistent with a normal cholangiogram.  I removed the cholangiocatheter. I placed clips on the cystic duct x3.  I freed the gallbladder from its remaining attachments to the liver. I ensured hemostasis on the gallbladder fossa of the liver and elsewhere. I inspected the rest of the abdomen &  detected no injury nor bleeding elsewhere.  I removed the gallbladder out the supraumbilical fascia. I closed the fascia transversely using #1 PDS interrupted stitches. I closed the skin using 4-0 monocryl stitch.  Sterile dressing was applied. The patient was extubated & arrived in the PACU in stable condition..  I had discussed postoperative care with the patient in the holding area.  Per the patient's request, I discussed operative findings, updated the patient's status, discussed probable steps to recovery, and gave postoperative recommendations to the patient's significant other, Nayshaun Hollaway.  Recommendations were made.  Questions were answered.  He expressed understanding & appreciation.  Hopefully she will make goals and be able to be discharged on postop day 1.  Adin Hector, M.D., F.A.C.S. Gastrointestinal and Minimally Invasive Surgery [facility] [address] [place], [place] [address] [phone] Main / Paging  02/05/2020 11:24 AM

## 2020-02-05 NOTE — Anesthesia Preprocedure Evaluation (Addendum)
Anesthesia Evaluation  Patient identified by MRN, date of birth, ID band Patient awake    Reviewed: Allergy & Precautions, NPO status , Patient's Chart, lab work & pertinent test results  Airway Mallampati: III  TM Distance: >3 FB Neck ROM: Full    Dental no notable dental hx. (+) Teeth Intact, Dental Advisory Given   Pulmonary asthma ,    Pulmonary exam normal breath sounds clear to auscultation       Cardiovascular + DVT  Normal cardiovascular exam Rhythm:Regular Rate:Normal     Neuro/Psych negative neurological ROS  negative psych ROS   GI/Hepatic negative GI ROS, Neg liver ROS,   Endo/Other  negative endocrine ROS  Renal/GU negative Renal ROS  negative genitourinary   Musculoskeletal negative musculoskeletal ROS (+)   Abdominal   Peds  Hematology negative hematology ROS (+)   Anesthesia Other Findings   Reproductive/Obstetrics                            Anesthesia Physical Anesthesia Plan  ASA: II  Anesthesia Plan: General   Post-op Pain Management:    Induction: Intravenous  PONV Risk Score and Plan: 3 and Midazolam, Dexamethasone and Ondansetron  Airway Management Planned: Oral ETT  Additional Equipment:   Intra-op Plan:   Post-operative Plan: Extubation in OR  Informed Consent: I have reviewed the patients History and Physical, chart, labs and discussed the procedure including the risks, benefits and alternatives for the proposed anesthesia with the patient or authorized representative who has indicated his/her understanding and acceptance.     Dental advisory given  Plan Discussed with: CRNA  Anesthesia Plan Comments:         Anesthesia Quick Evaluation

## 2020-02-05 NOTE — Transfer of Care (Signed)
Immediate Anesthesia Transfer of Care Note  Patient: Paula Warner  Procedure(s) Performed: LAPAROSCOPIC CHOLECYSTECTOMY WITH POSSIBLE INTRAOPERATIVE CHOLANGIOGRAM (N/A )  Patient Location: PACU  Anesthesia Type:General  Level of Consciousness: awake, alert  and patient cooperative  Airway & Oxygen Therapy: Patient Spontanous Breathing and Patient connected to face mask oxygen  Post-op Assessment: Report given to RN and Post -op Vital signs reviewed and stable  Post vital signs: Reviewed and stable  Last Vitals:  Vitals Value Taken Time  BP 125/79 02/05/20 1133  Temp    Pulse 93 02/05/20 1137  Resp 19 02/05/20 1137  SpO2 100 % 02/05/20 1137  Vitals shown include unvalidated device data.  Last Pain:  Vitals:   02/05/20 0800  TempSrc: Oral  PainSc:       Patients Stated Pain Goal: 1 (11/21/38 6986)  Complications: No apparent anesthesia complications

## 2020-02-05 NOTE — Interval H&P Note (Signed)
History and Physical Interval Note:  02/05/2020 9:09 AM  Paula Warner  has presented today for surgery, with the diagnosis of GALLSTONE PANCREATITIS.  The various methods of treatment have been discussed with the patient and family. After consideration of risks, benefits and other options for treatment, the patient has consented to  Procedure(s): LAPAROSCOPIC CHOLECYSTECTOMY WITH POSSIBLE INTRAOPERATIVE CHOLANGIOGRAM (N/A) as a surgical intervention.  The patient's history has been reviewed, patient examined, no change in status, stable for surgery.  I have reviewed the patient's chart and labs.  Questions were answered to the patient's satisfaction.    I have re-reviewed the the patient's records, history, medications, and allergies.  I have re-examined the patient.  I again discussed intraoperative plans and goals of post-operative recovery.  The patient agrees to proceed.  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   History of kidney stones    Menstrual periods irregular     Past Surgical History:  Procedure Laterality Date   CESAREAN SECTION     x 2   LAPAROSCOPIC TUBAL LIGATION Bilateral 05/08/2017   Procedure: LAPAROSCOPIC TUBAL LIGATION - FILSHIE CLIPS;  Surgeon: Emily Filbert, MD;  Location: [place];  Service: Gynecology;  Laterality: Bilateral;   LOWER EXTREMITY ANGIOGRAM Left 07/11/2016   Procedure: Percutaneous Venous  Thrombectomy with IVUS and  with Stent Placement;  Surgeon: Serafina Mitchell, MD;  Location: [facility] OR;  Service: Vascular;  Laterality: Left;    Social History   Socioeconomic History   Marital status: Single    Spouse name: Not on file   Number of children: Not on file   Years of education: Not on file   Highest education level: Not on file  Occupational History   Not on file  Tobacco Use   Smoking status: Never Smoker   Smokeless tobacco: Never Used  Substance and Sexual Activity   Alcohol use: No    Alcohol/week: 0.0 standard drinks   Drug use: No   Sexual activity: Yes    Partners: Male    Birth control/protection: Surgical  Other Topics Concern   Not on file  Social History Narrative   Not on file   Social Determinants of Health   Financial Resource Strain:    Difficulty of Paying Living Expenses:   Food Insecurity:    Worried About Charity fundraiser in the Last Year:    Arboriculturist in the Last Year:   Transportation Needs:    Film/video editor (Medical):    Lack of Transportation (Non-Medical):   Physical Activity:    Days of Exercise per Week:    Minutes of Exercise per Session:   Stress:    Feeling of Stress :   Social Connections:    Frequency of Communication with Friends and Family:    Frequency of Social Gatherings with Friends and Family:    Attends Religious Services:  Active Member of Clubs or Organizations:    Attends Music therapist:    Marital Status:   Intimate Partner Violence:    Fear of Current or Ex-Partner:    Emotionally Abused:    Physically Abused:    Sexually Abused:     Family History  Problem Relation Age of Onset   Heart murmur Mother    Asthma Mother    Clotting disorder Father    Heart disease Father     Medications Prior to Admission  Medication Sig Dispense Refill Last Dose   bismuth subsalicylate (PEPTO BISMOL) 262 MG/15ML suspension Take 30 mLs by mouth every 6 (six) hours as needed for  indigestion or diarrhea or loose stools.   unknown   ibuprofen (ADVIL) 200 MG tablet Take 400 mg by mouth every 6 (six) hours as needed for headache or moderate pain.   02/02/2020 at Unknown time   methocarbamol (ROBAXIN) 500 MG tablet Take 1 tablet (500 mg total) by mouth at bedtime as needed for muscle spasms. 10 tablet 0 02/02/2020 at Unknown time    Current Facility-Administered Medications  Medication Dose Route Frequency Provider Last Rate Last Admin   [MAR Hold] acetaminophen (TYLENOL) tablet 650 mg  650 mg Oral Q6H PRN Paula Quill, DO   650 mg at 02/03/20 1736   Or   [MAR Hold] acetaminophen (TYLENOL) suppository 650 mg  650 mg Rectal Q6H PRN Paula Quill, DO       [MAR Hold] Chlorhexidine Gluconate Cloth 2 % PADS 6 each  6 each Topical Once Michael Boston, MD       Chlorhexidine Gluconate Cloth 2 % PADS 6 each  6 each Topical Once Donnie Mesa, MD       And   Chlorhexidine Gluconate Cloth 2 % PADS 6 each  6 each Topical Once Donnie Mesa, MD       clindamycin (CLEOCIN) IVPB 900 mg  900 mg Intravenous On Call to OR Michael Boston, MD       And   gentamicin (GARAMYCIN) 350 mg in dextrose 5 % 100 mL IVPB  350 mg Intravenous On Call to OR Michael Boston, MD       dextrose 5 % in lactated ringers infusion   Intravenous Continuous Florencia Reasons, MD       [facility] Hold] fentaNYL (SUBLIMAZE) injection 50 mcg  50 mcg Intravenous Q2H PRN Paula Bulls, MD   50 mcg at 02/03/20 2158   lactated ringers infusion   Intravenous Continuous Michael Boston, MD       [MAR Hold] ondansetron [facility]) tablet 4 mg  4 mg Oral Q6H PRN Paula Quill, DO       Or   [MAR Hold] ondansetron [facility]) injection 4 mg  4 mg Intravenous Q6H PRN Paula Quill, DO   4 mg at 02/03/20 0245   scopolamine (TRANSDERM-SCOP) 1 MG/3DAYS 1.5 mg  1 patch Transdermal Q72H Woodrum, Chelsey L, MD   1.5 mg at 02/05/20 0907   [MAR Hold] sodium chloride flush (NS) 0.9 % injection 3 mL  3 mL Intravenous Once Hayden Rasmussen, MD          Allergies  Allergen Reactions   Zinacef [Cefuroxime] Hives    BP 107/61 (BP Location: Right Arm)   Pulse (!) 58   Temp 98.1 F (36.7 C)   Resp 18   Ht 5\' 1"  (1.549 m)   Wt 70.7 kg   SpO2 97%   BMI 29.45  kg/m   Labs: Results for orders placed or performed during the hospital encounter of 02/02/20 (from the past 48 hour(s))  Surgical pcr screen     Status: None   Collection Time: 02/03/20 10:08 PM   Specimen: Nasal Mucosa; Nasal Swab  Result Value Ref Range   MRSA, PCR NEGATIVE NEGATIVE   Staphylococcus aureus NEGATIVE NEGATIVE    Comment: (NOTE) The Xpert SA Assay (FDA approved for NASAL specimens in patients 40 years of age and older), is one component of a comprehensive surveillance program. It is not intended to diagnose infection nor to guide or monitor treatment. Performed at [facility], [place] [address] [place], [place] [address]   CBC     Status: None   Collection Time: 02/04/20  4:28 AM  Result Value Ref Range   WBC 7.1 4.0 - 10.5 K/uL   RBC 3.97 3.87 - 5.11 MIL/uL   Hemoglobin 12.7 12.0 - 15.0 g/dL   HCT 38.0 36.0 - 46.0 %   MCV 95.7 80.0 - 100.0 fL   MCH 32.0 26.0 - 34.0 pg   MCHC 33.4 30.0 - 36.0 g/dL   RDW 13.1 11.5 - 15.5 %   Platelets 210 150 - 400 K/uL   nRBC 0.0 0.0 - 0.2 %    Comment: Performed at [facility], [place] [address], [place], [place] [address]  Comprehensive metabolic panel     Status: Abnormal   Collection Time: 02/04/20  4:28 AM  Result Value Ref Range   Sodium 137 135 - 145 mmol/L   Potassium 3.6 3.5 - 5.1 mmol/L   Chloride 110 98 - 111 mmol/L   CO2 23 22 - 32 mmol/L   Glucose, Bld 85 70 - 99 mg/dL    Comment: Glucose reference range applies only to samples taken after fasting for at least 8 hours.   BUN 5 (L) 6 - 20 mg/dL   Creatinine, Ser 0.54 0.44 - 1.00 mg/dL   Calcium 8.3 (L) 8.9 - 10.3 mg/dL   Total Protein 6.2 (L) 6.5 - 8.1 g/dL   Albumin 3.2 (L) 3.5 - 5.0 g/dL   AST 128 (H)  15 - 41 U/L   ALT 261 (H) 0 - 44 U/L   Alkaline Phosphatase 145 (H) 38 - 126 U/L   Total Bilirubin 4.1 (H) 0.3 - 1.2 mg/dL   GFR calc non Af Amer >60 >60 mL/min   GFR calc Af Amer >60 >60 mL/min   Anion gap 4 (L) 5 - 15    Comment: Performed at [facility], [place] [address] [place], [place] [address]  Lipase, blood     Status: Abnormal   Collection Time: 02/04/20  4:28 AM  Result Value Ref Range   Lipase 56 (H) 11 - 51 U/L    Comment: Performed at [facility], [place] [address] [place], [place] [address]  Comprehensive metabolic panel     Status: Abnormal   Collection Time: 02/04/20 12:02 PM  Result Value Ref Range   Sodium 141 135 - 145 mmol/L   Potassium 3.8 3.5 - 5.1 mmol/L   Chloride 110 98 - 111 mmol/L   CO2 24 22 - 32 mmol/L   Glucose, Bld 87 70 - 99 mg/dL    Comment: Glucose reference range applies only to samples taken after fasting for at least 8 hours.   BUN <5 (L) 6 - 20 mg/dL   Creatinine, Ser 0.54 0.44 - 1.00 mg/dL  Calcium 8.7 (L) 8.9 - 10.3 mg/dL   Total Protein 7.0 6.5 - 8.1 g/dL   Albumin 3.7 3.5 - 5.0 g/dL   AST 108 (H) 15 - 41 U/L   ALT 254 (H) 0 - 44 U/L   Alkaline Phosphatase 158 (H) 38 - 126 U/L   Total Bilirubin 4.0 (H) 0.3 - 1.2 mg/dL   GFR calc non Af Amer >60 >60 mL/min   GFR calc Af Amer >60 >60 mL/min   Anion gap 7 5 - 15    Comment: Performed at [facility], [place] [address], [place], [place] [address]  CBC     Status: None   Collection Time: 02/05/20  4:51 AM  Result Value Ref Range   WBC 7.9 4.0 - 10.5 K/uL   RBC 4.12 3.87 - 5.11 MIL/uL   Hemoglobin 13.0 12.0 - 15.0 g/dL   HCT 39.5 36.0 - 46.0 %   MCV 95.9 80.0 - 100.0 fL   MCH 31.6 26.0 - 34.0 pg   MCHC 32.9 30.0 - 36.0 g/dL   RDW 13.2 11.5 - 15.5 %   Platelets 188 150 - 400 K/uL   nRBC 0.0 0.0 - 0.2 %    Comment: Performed at [facility], [place] [address], [place], [place] [address]  Lipase, blood     Status:  None   Collection Time: 02/05/20  4:51 AM  Result Value Ref Range   Lipase 29 11 - 51 U/L    Comment: Performed at [facility], [place] [address], [place], [place] [address]  Comprehensive metabolic panel     Status: Abnormal   Collection Time: 02/05/20  4:51 AM  Result Value Ref Range   Sodium 139 135 - 145 mmol/L   Potassium 3.7 3.5 - 5.1 mmol/L   Chloride 107 98 - 111 mmol/L   CO2 27 22 - 32 mmol/L   Glucose, Bld 108 (H) 70 - 99 mg/dL    Comment: Glucose reference range applies only to samples taken after fasting for at least 8 hours.   BUN <5 (L) 6 - 20 mg/dL   Creatinine, Ser 0.60 0.44 - 1.00 mg/dL   Calcium 8.8 (L) 8.9 - 10.3 mg/dL   Total Protein 6.5 6.5 - 8.1 g/dL   Albumin 3.4 (L) 3.5 - 5.0 g/dL   AST 66 (H) 15 - 41 U/L   ALT 196 (H) 0 - 44 U/L   Alkaline Phosphatase 141 (H) 38 - 126 U/L   Total Bilirubin 2.9 (H) 0.3 - 1.2 mg/dL   GFR calc non Af Amer >60 >60 mL/min   GFR calc Af Amer >60 >60 mL/min   Anion gap 5 5 - 15    Comment: Performed at [facility], [place] [address] [place], [place] [address]  Magnesium     Status: None   Collection Time: 02/05/20  4:51 AM  Result Value Ref Range   Magnesium 1.9 1.7 - 2.4 mg/dL    Comment: Performed at [facility], [place] [address] [place], [place] [address]    Imaging / Studies: DG Chest 2 View  Result Date: 01/09/2020 CLINICAL DATA:  Mid back pain radiating to the chest and abdomen for the past 2 days. EXAM: CHEST - 2 VIEW COMPARISON:  None. FINDINGS: Normal sized heart. Mild peribronchial thickening. Poor inspiration. Mild linear density at the left lung base. Mild scoliosis. IMPRESSION: 1. Mild bronchitic changes. 2. Poor inspiration with mild left basilar atelectasis.  Electronically Signed   By: Claudie Revering M.D.   On: 01/09/2020 12:18   CT ANGIO CHEST PE W OR WO CONTRAST  Result Date: 01/09/2020 CLINICAL DATA:  Mid back pain radiating to the chest for 2 days with  pleuritic symptoms. Elevated D-dimer. EXAM: CT ANGIOGRAPHY CHEST WITH CONTRAST TECHNIQUE: Multidetector CT imaging of the chest was performed using the standard protocol during bolus administration of intravenous contrast. Multiplanar CT image reconstructions and MIPs were obtained to evaluate the vascular anatomy. CONTRAST:  127mL OMNIPAQUE IOHEXOL 350 MG/ML SOLN COMPARISON:  Chest radiograph from earlier today. FINDINGS: Cardiovascular: The study is high quality for the evaluation of pulmonary embolism. There are no filling defects in the central, lobar, segmental or subsegmental pulmonary artery branches to suggest acute pulmonary embolism. Great vessels are normal in course and caliber. Top-normal heart size. No significant pericardial fluid/thickening. Mediastinum/Nodes: No discrete thyroid nodules. Unremarkable esophagus. No pathologically enlarged axillary, mediastinal or hilar lymph nodes. Lungs/Pleura: No pneumothorax. No pleural effusion. No acute consolidative airspace disease, lung masses or significant pulmonary nodules. 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 Upper abdomen: Cholelithiasis. Musculoskeletal:  No aggressive appearing focal osseous lesions. Review of the MIP images confirms the above findings. IMPRESSION: 1. No pulmonary embolism. 2. 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 3. Cholelithiasis. Electronically Signed   By: Ilona Sorrel M.D.   On: 01/09/2020 14:55   CT Abdomen Pelvis W Contrast  Result Date: 02/02/2020 CLINICAL DATA:  Upper abdominal pain with nausea and vomiting for several weeks, history of cholelithiasis EXAM: CT ABDOMEN AND PELVIS WITH CONTRAST TECHNIQUE: Multidetector CT imaging of the abdomen and pelvis was performed using the standard protocol following bolus administration of intravenous contrast. CONTRAST:  132mL OMNIPAQUE IOHEXOL 300  MG/ML  SOLN COMPARISON:  04/06/2019 FINDINGS: Lower chest: Mild scarring in the bases bilaterally. No focal infiltrate or sizable effusion is seen. Hepatobiliary: Fatty infiltration of the liver is noted. The gallbladder is again well visualized with multiple faceted gallstones within. No biliary ductal dilatation is seen. Pancreas: Mild peripancreatic inflammatory changes are noted in the region of the head and uncinate process consistent with focal pancreatitis. Spleen: Normal in size without focal abnormality. Adrenals/Urinary Tract: Adrenal glands are within normal limits. Kidneys demonstrate a normal enhancement pattern. Few scattered small cysts are noted. Scattered small renal calculi are noted as well similar to that seen on the prior exam. The largest of these lies in the upper pole of the right kidney measuring 2 mm. Bladder is within normal limits. Stomach/Bowel: The appendix is within normal limits. No obstructive or inflammatory changes of the colon are seen. Small bowel and stomach are unremarkable. Vascular/Lymphatic: No significant vascular findings are present. No enlarged abdominal or pelvic lymph nodes. Left iliac venous stent is noted. Reproductive: Uterus and bilateral adnexa are unremarkable. Changes of prior tubal ligation are seen Other: No abdominal wall hernia or abnormality. No abdominopelvic ascites. Musculoskeletal: No acute or significant osseous findings. IMPRESSION: Changes of mild acute pancreatitis surrounding pancreatic head and uncinate process. Cholelithiasis without complicating factors. Bilateral small renal calculi without obstructive change. Electronically Signed   By: Inez Catalina M.D.   On: 02/02/2020 21:13   MR ABDOMEN MRCP WO CONTRAST  Result Date: 02/04/2020 CLINICAL DATA:  Cholelithiasis, suspected pancreatitis EXAM: MRI ABDOMEN WITHOUT CONTRAST  (INCLUDING MRCP) TECHNIQUE: Multiplanar multisequence MR imaging of the abdomen was performed. Heavily T2-weighted images  of the biliary and pancreatic ducts were obtained, and three-dimensional MRCP images were rendered by post processing. COMPARISON:  CT abdomen and pelvis 02/02/2020 FINDINGS: Lower chest: Incidental imaging of the lung bases with signs of basilar atelectasis or airspace disease on the LEFT not well evaluated on MR. Hepatobiliary: Liver is unremarkable. Numerous gallstones in the gallbladder without pericholecystic stranding. No biliary ductal dilation. Assessment of the biliary tree is limited due to motion on MRCP images. Radial thick slab MRCP sequences without sign of filling defect in the common bile duct. Pancreas: Pancreas with minimal peripancreatic edema about the head and uncinate process. No ductal dilation. No sign of pancreatic lesion. Spleen:  Spleen is normal size without focal lesion. Adrenals/Urinary Tract: Adrenal glands are normal. Renal contours are smooth without signs of hydronephrosis or renal lesion. Stomach/Bowel: Gastrointestinal tract is unremarkable to the extent visualized with limited assessment on MRI. Vascular/Lymphatic: Vascular structures in the abdomen are grossly patent. Other:  No ascites. Musculoskeletal: No suspicious bone lesions identified. IMPRESSION: 1. Mild peripancreatic edema about the head and uncinate process, no fluid collection or change in T1 signal of pancreatic parenchyma beyond subtle decreased signal in the area of edema findings suggest mild acute interstitial pancreatitis. 2. Assessment of the biliary tree is limited due to motion on MRCP images. No sign of filling defect in the common bile duct. No biliary ductal dilation. 3. Cholelithiasis without current signs of acute cholecystitis. Study limited by lack of intravenous contrast. 4. Incidental signs of basilar atelectasis or airspace disease in the LEFT lung base not well evaluated on MR. Electronically Signed   By: Zetta Bills M.D.   On: 02/04/2020 11:40   MR ABDOMEN MRCP WO CONTRAST  Result Date:  01/11/2020 CLINICAL DATA:  34 year old female with history of abdominal pain. Cholelithiasis noted on recent ultrasound examination. EXAM: MRI ABDOMEN WITHOUT CONTRAST  (INCLUDING MRCP) TECHNIQUE: Multiplanar multisequence MR imaging of the abdomen was performed. Heavily T2-weighted images of the biliary and pancreatic ducts were obtained, and three-dimensional MRCP images were rendered by post processing. COMPARISON:  No prior abdominal MRI. Abdominal ultrasound 01/09/2020. FINDINGS: Comment: Today's study is limited for detection and characterization of visceral and/or vascular lesions by lack of IV gadolinium. Lower chest: Unremarkable. Hepatobiliary: No suspicious cystic or solid hepatic lesions are confidently identified on today's noncontrast examination. No intra or extrahepatic biliary ductal dilatation. Common bile duct measures 3 mm in the porta hepatis. No filling defect in the common bile duct. There multiple filling defects filling the lumen of the gallbladder, compatible with cholelithiasis. No gallbladder wall thickening or pericholecystic fluid. Pancreas: No definite pancreatic mass. No pancreatic ductal dilatation noted on MRCP images. No pancreatic or peripancreatic fluid collections or inflammatory changes. Spleen:  Unremarkable. Adrenals/Urinary Tract: 7 mm T1 hypointense, T2 hyperintense lesion in the upper pole the left kidney, incompletely characterized on today's noncontrast examination, but statistically likely to represent a cyst. Right kidney and bilateral adrenal glands are normal in appearance. No hydroureteronephrosis in the visualized portions of the abdomen. Stomach/Bowel: Visualized portions are unremarkable. Vascular/Lymphatic: No aneurysm identified in the visualized abdominal vasculature. No lymphadenopathy noted in the abdomen. Other: No significant volume of ascites noted in the visualized portions of the peritoneal cavity. Musculoskeletal: No aggressive appearing osseous  lesions are noted in the visualized portions of the skeleton. IMPRESSION: 1. Cholelithiasis without evidence of acute cholecystitis at this time. 2. No evidence of cholelithiasis or findings to suggest biliary tract obstruction. Electronically Signed   By: Mauri Brooklyn.D.  On: 01/11/2020 14:39   MR 3D Recon At Scanner  Result Date: 02/04/2020 CLINICAL DATA:  Cholelithiasis, suspected pancreatitis EXAM: MRI ABDOMEN WITHOUT CONTRAST  (INCLUDING MRCP) TECHNIQUE: Multiplanar multisequence MR imaging of the abdomen was performed. Heavily T2-weighted images of the biliary and pancreatic ducts were obtained, and three-dimensional MRCP images were rendered by post processing. COMPARISON:  CT abdomen and pelvis 02/02/2020 FINDINGS: Lower chest: Incidental imaging of the lung bases with signs of basilar atelectasis or airspace disease on the LEFT not well evaluated on MR. Hepatobiliary: Liver is unremarkable. Numerous gallstones in the gallbladder without pericholecystic stranding. No biliary ductal dilation. Assessment of the biliary tree is limited due to motion on MRCP images. Radial thick slab MRCP sequences without sign of filling defect in the common bile duct. Pancreas: Pancreas with minimal peripancreatic edema about the head and uncinate process. No ductal dilation. No sign of pancreatic lesion. Spleen:  Spleen is normal size without focal lesion. Adrenals/Urinary Tract: Adrenal glands are normal. Renal contours are smooth without signs of hydronephrosis or renal lesion. Stomach/Bowel: Gastrointestinal tract is unremarkable to the extent visualized with limited assessment on MRI. Vascular/Lymphatic: Vascular structures in the abdomen are grossly patent. Other:  No ascites. Musculoskeletal: No suspicious bone lesions identified. IMPRESSION: 1. Mild peripancreatic edema about the head and uncinate process, no fluid collection or change in T1 signal of pancreatic parenchyma beyond subtle decreased signal in  the area of edema findings suggest mild acute interstitial pancreatitis. 2. Assessment of the biliary tree is limited due to motion on MRCP images. No sign of filling defect in the common bile duct. No biliary ductal dilation. 3. Cholelithiasis without current signs of acute cholecystitis. Study limited by lack of intravenous contrast. 4. Incidental signs of basilar atelectasis or airspace disease in the LEFT lung base not well evaluated on MR. Electronically Signed   By: Zetta Bills M.D.   On: 02/04/2020 11:40   MR 3D Recon At Scanner  Result Date: 01/11/2020 CLINICAL DATA:  34 year old female with history of abdominal pain. Cholelithiasis noted on recent ultrasound examination. EXAM: MRI ABDOMEN WITHOUT CONTRAST  (INCLUDING MRCP) TECHNIQUE: Multiplanar multisequence MR imaging of the abdomen was performed. Heavily T2-weighted images of the biliary and pancreatic ducts were obtained, and three-dimensional MRCP images were rendered by post processing. COMPARISON:  No prior abdominal MRI. Abdominal ultrasound 01/09/2020. FINDINGS: Comment: Today's study is limited for detection and characterization of visceral and/or vascular lesions by lack of IV gadolinium. Lower chest: Unremarkable. Hepatobiliary: No suspicious cystic or solid hepatic lesions are confidently identified on today's noncontrast examination. No intra or extrahepatic biliary ductal dilatation. Common bile duct measures 3 mm in the porta hepatis. No filling defect in the common bile duct. There multiple filling defects filling the lumen of the gallbladder, compatible with cholelithiasis. No gallbladder wall thickening or pericholecystic fluid. Pancreas: No definite pancreatic mass. No pancreatic ductal dilatation noted on MRCP images. No pancreatic or peripancreatic fluid collections or inflammatory changes. Spleen:  Unremarkable. Adrenals/Urinary Tract: 7 mm T1 hypointense, T2 hyperintense lesion in the upper pole the left kidney, incompletely  characterized on today's noncontrast examination, but statistically likely to represent a cyst. Right kidney and bilateral adrenal glands are normal in appearance. No hydroureteronephrosis in the visualized portions of the abdomen. Stomach/Bowel: Visualized portions are unremarkable. Vascular/Lymphatic: No aneurysm identified in the visualized abdominal vasculature. No lymphadenopathy noted in the abdomen. Other: No significant volume of ascites noted in the visualized portions of the peritoneal cavity. Musculoskeletal: No aggressive appearing osseous lesions are  noted in the visualized portions of the skeleton. IMPRESSION: 1. Cholelithiasis without evidence of acute cholecystitis at this time. 2. No evidence of cholelithiasis or findings to suggest biliary tract obstruction. Electronically Signed   By: Vinnie Langton M.D.   On: 01/11/2020 14:39   US Abdomen Limited RUQ  Result Date: 01/09/2020 CLINICAL DATA:  Elevated liver function tests. EXAM: ULTRASOUND ABDOMEN LIMITED RIGHT UPPER QUADRANT COMPARISON:  April 06, 2019. FINDINGS: Gallbladder: The gallbladder lumen is full of gallstones without significant gallbladder wall thickening or pericholecystic fluid. Sonographic Murphy's sign could not be assessed as the patient had received morphine. Common bile duct: Diameter: 4 mm which is within normal limits. Liver: No focal lesion identified. Within normal limits in parenchymal echogenicity. Portal vein is patent on color Doppler imaging with normal direction of blood flow towards the liver. Other: None. IMPRESSION: Cholelithiasis is noted.  No definite evidence of cholecystitis. Electronically Signed   By: Marijo Conception M.D.   On: 01/09/2020 14:05     .Adin Hector, M.D., F.A.C.S. Gastrointestinal and [facility]ally [facility]vasive [facility] [address] [place], [place] [address] [phone] Main / Paging  02/05/2020 9:09 AM    Adin Hector

## 2020-02-06 ENCOUNTER — Other Ambulatory Visit (HOSPITAL_COMMUNITY): Payer: Medicaid Other

## 2020-02-06 LAB — CBC WITH DIFFERENTIAL/PLATELET
Abs Immature Granulocytes: 0.02 10*3/uL (ref 0.00–0.07)
Basophils Absolute: 0 10*3/uL (ref 0.0–0.1)
Basophils Relative: 0 %
Eosinophils Absolute: 0 10*3/uL (ref 0.0–0.5)
Eosinophils Relative: 0 %
HCT: 39.2 % (ref 36.0–46.0)
Hemoglobin: 12.7 g/dL (ref 12.0–15.0)
Immature Granulocytes: 0 %
Lymphocytes Relative: 28 %
Lymphs Abs: 2.8 10*3/uL (ref 0.7–4.0)
MCH: 31 pg (ref 26.0–34.0)
MCHC: 32.4 g/dL (ref 30.0–36.0)
MCV: 95.6 fL (ref 80.0–100.0)
Monocytes Absolute: 1.2 10*3/uL — ABNORMAL HIGH (ref 0.1–1.0)
Monocytes Relative: 11 %
Neutro Abs: 6.2 10*3/uL (ref 1.7–7.7)
Neutrophils Relative %: 61 %
Platelets: 208 10*3/uL (ref 150–400)
RBC: 4.1 MIL/uL (ref 3.87–5.11)
RDW: 13.2 % (ref 11.5–15.5)
WBC: 10.3 10*3/uL (ref 4.0–10.5)
nRBC: 0 % (ref 0.0–0.2)

## 2020-02-06 LAB — COMPREHENSIVE METABOLIC PANEL
ALT: 162 U/L — ABNORMAL HIGH (ref 0–44)
AST: 51 U/L — ABNORMAL HIGH (ref 15–41)
Albumin: 3.5 g/dL (ref 3.5–5.0)
Alkaline Phosphatase: 125 U/L (ref 38–126)
Anion gap: 6 (ref 5–15)
BUN: 10 mg/dL (ref 6–20)
CO2: 24 mmol/L (ref 22–32)
Calcium: 9.3 mg/dL (ref 8.9–10.3)
Chloride: 111 mmol/L (ref 98–111)
Creatinine, Ser: 0.58 mg/dL (ref 0.44–1.00)
GFR calc Af Amer: >60 mL/min (ref >60)
GFR calc non Af Amer: >60 mL/min (ref >60)
Glucose, Bld: 119 mg/dL — ABNORMAL HIGH (ref 70–99)
Potassium: 3.8 mmol/L (ref 3.5–5.1)
Sodium: 141 mmol/L (ref 135–145)
Total Bilirubin: 1.1 mg/dL (ref 0.3–1.2)
Total Protein: 6.9 g/dL (ref 6.5–8.1)

## 2020-02-06 LAB — MAGNESIUM: Magnesium: 1.8 mg/dL (ref 1.7–2.4)

## 2020-02-06 MED ORDER — ACETAMINOPHEN 325 MG PO TABS: 650.0000 mg | ORAL_TABLET | Freq: Four times a day (QID) | ORAL | 0 refills | Status: DC | PRN

## 2020-02-06 MED ORDER — POTASSIUM CHLORIDE CRYS ER 20 MEQ PO TBCR
40.0000 meq | EXTENDED_RELEASE_TABLET | Freq: Once | ORAL | Status: AC
  Administered 2020-02-06: 40 meq via ORAL
  Filled 2020-02-06: qty 2

## 2020-02-06 MED ORDER — MAGNESIUM SULFATE IN D5W 1-5 GM/100ML-% IV SOLN
1.0000 g | Freq: Once | INTRAVENOUS | Status: AC
  Administered 2020-02-06: 1 g via INTRAVENOUS
  Filled 2020-02-06: qty 100

## 2020-02-06 NOTE — Progress Notes (Signed)
[facility]:  [phone] General Surgery Progress Note   LOS: 4 days  POD -  1 Day Post-Op  Assessment and Plan: 1.  LAPAROSCOPIC CHOLECYSTECTOMY, INTRAOPERATIVE CHOLANGIOGRAM - 02/05/2020 - Gross  WBC - 10,300 - 02/06/2020  Gall stone pancreatitis  Ready to go home - doing well  May return to work 02/15/2020 - I gave her a note.  She should follow up with Dr. Johney Maine in 2 to 3 weeks    Principal Problem:   Acute gallstone pancreatitis  Subjective:  Doing well.  She is ready to go home.  Objective:   Vitals:   02/05/20 2146 02/06/20 0523  BP: 115/68 (!) 104/54  Pulse: 76 (!) 58  Resp: 18 18  Temp: 98.9 F (37.2 C) 98.8 F (37.1 C)  SpO2: 94% 97%     Intake/Output from previous day:  05/14 0701 - 05/15 0700 In: 2748.8 [P.O.:740; I.V.:1850; IV Piggyback:158.8] Out: 2200 [Urine:2200]  Intake/Output this shift:  Total I/O In: 240 [P.O.:240] Out: 300 [Urine:300]   Physical Exam:   General: WN F who is alert and oriented.    HEENT: Normal. Pupils equal. .   Lungs: Clear   Abdomen: Soft   Wound: Dressings intact.   Lab Results:    Recent Labs    02/05/20 1348 02/06/20 0521  WBC 11.9* 10.3  HGB 13.9 12.7  HCT 42.1 39.2  PLT 206 208    BMET   Recent Labs    02/05/20 0451 02/06/20 0521  NA 139 141  K 3.7 3.8  CL 107 111  CO2 27 24  GLUCOSE 108* 119*  BUN <5* 10  CREATININE 0.60 0.58  CALCIUM 8.8* 9.3    PT/INR  No results for input(s): LABPROT, INR in the last 72 hours.  ABG  No results for input(s): PHART, HCO3 in the last 72 hours.  Invalid input(s): PCO2, PO2   Studies/Results:  DG Cholangiogram Operative  Result Date: 02/05/2020 CLINICAL DATA:  Intraoperative cholangiogram during laparoscopic cholecystectomy. EXAM: INTRAOPERATIVE CHOLANGIOGRAM FLUOROSCOPY TIME:  17 seconds COMPARISON:  MRCP-04/05/2020 FINDINGS: Intraoperative cholangiographic images of the right upper abdominal quadrant during laparoscopic cholecystectomy  are provided for review. Surgical clips overlie the expected location of the gallbladder fossa. Enteric tube tip and side port project over the expected location of mid body of the stomach. Contrast injection demonstrates selective cannulation of the central aspect of the cystic duct. There is passage of contrast through the central aspect of the cystic duct with filling of a non dilated common bile duct. There is passage of contrast though the CBD and into the descending portion of the duodenum. There is minimal reflux of injected contrast into the common hepatic duct and central aspect of the non dilated intrahepatic biliary system. There are no discrete filling defects within the opacified portions of the biliary system to suggest the presence of choledocholithiasis. IMPRESSION: No evidence of choledocholithiasis. Electronically Signed   By: Sandi Mariscal M.D.   On: 02/05/2020 11:22   MR ABDOMEN MRCP WO CONTRAST  Result Date: 02/04/2020 CLINICAL DATA:  Cholelithiasis, suspected pancreatitis EXAM: MRI ABDOMEN WITHOUT CONTRAST  (INCLUDING MRCP) TECHNIQUE: Multiplanar multisequence MR imaging of the abdomen was performed. Heavily T2-weighted images of the biliary and pancreatic ducts were obtained, and three-dimensional MRCP images were rendered by post processing. COMPARISON:  CT abdomen and pelvis 02/02/2020 FINDINGS: Lower chest: Incidental imaging of the lung bases with signs of basilar atelectasis or airspace disease on the LEFT not well evaluated on MR. Hepatobiliary:  Liver is unremarkable. Numerous gallstones in the gallbladder without pericholecystic stranding. No biliary ductal dilation. Assessment of the biliary tree is limited due to motion on MRCP images. Radial thick slab MRCP sequences without sign of filling defect in the common bile duct. Pancreas: Pancreas with minimal peripancreatic edema about the head and uncinate process. No ductal dilation. No sign of pancreatic lesion. Spleen:  Spleen is  normal size without focal lesion. Adrenals/Urinary Tract: Adrenal glands are normal. Renal contours are smooth without signs of hydronephrosis or renal lesion. Stomach/Bowel: Gastrointestinal tract is unremarkable to the extent visualized with limited assessment on MRI. Vascular/Lymphatic: Vascular structures in the abdomen are grossly patent. Other:  No ascites. Musculoskeletal: No suspicious bone lesions identified. IMPRESSION: 1. Mild peripancreatic edema about the head and uncinate process, no fluid collection or change in T1 signal of pancreatic parenchyma beyond subtle decreased signal in the area of edema findings suggest mild acute interstitial pancreatitis. 2. Assessment of the biliary tree is limited due to motion on MRCP images. No sign of filling defect in the common bile duct. No biliary ductal dilation. 3. Cholelithiasis without current signs of acute cholecystitis. Study limited by lack of intravenous contrast. 4. Incidental signs of basilar atelectasis or airspace disease in the LEFT lung base not well evaluated on MR. Electronically Signed   By: Zetta Bills M.D.   On: 02/04/2020 11:40   MR 3D Recon At Scanner  Result Date: 02/04/2020 CLINICAL DATA:  Cholelithiasis, suspected pancreatitis EXAM: MRI ABDOMEN WITHOUT CONTRAST  (INCLUDING MRCP) TECHNIQUE: Multiplanar multisequence MR imaging of the abdomen was performed. Heavily T2-weighted images of the biliary and pancreatic ducts were obtained, and three-dimensional MRCP images were rendered by post processing. COMPARISON:  CT abdomen and pelvis 02/02/2020 FINDINGS: Lower chest: Incidental imaging of the lung bases with signs of basilar atelectasis or airspace disease on the LEFT not well evaluated on MR. Hepatobiliary: Liver is unremarkable. Numerous gallstones in the gallbladder without pericholecystic stranding. No biliary ductal dilation. Assessment of the biliary tree is limited due to motion on MRCP images. Radial thick slab MRCP  sequences without sign of filling defect in the common bile duct. Pancreas: Pancreas with minimal peripancreatic edema about the head and uncinate process. No ductal dilation. No sign of pancreatic lesion. Spleen:  Spleen is normal size without focal lesion. Adrenals/Urinary Tract: Adrenal glands are normal. Renal contours are smooth without signs of hydronephrosis or renal lesion. Stomach/Bowel: Gastrointestinal tract is unremarkable to the extent visualized with limited assessment on MRI. Vascular/Lymphatic: Vascular structures in the abdomen are grossly patent. Other:  No ascites. Musculoskeletal: No suspicious bone lesions identified. IMPRESSION: 1. Mild peripancreatic edema about the head and uncinate process, no fluid collection or change in T1 signal of pancreatic parenchyma beyond subtle decreased signal in the area of edema findings suggest mild acute interstitial pancreatitis. 2. Assessment of the biliary tree is limited due to motion on MRCP images. No sign of filling defect in the common bile duct. No biliary ductal dilation. 3. Cholelithiasis without current signs of acute cholecystitis. Study limited by lack of intravenous contrast. 4. Incidental signs of basilar atelectasis or airspace disease in the LEFT lung base not well evaluated on MR. Electronically Signed   By: Zetta Bills M.D.   On: 02/04/2020 11:40     Anti-infectives:   Anti-infectives (From admission, onward)   Start     Dose/Rate Route Frequency Ordered Stop   02/05/20 0930  ciprofloxacin (CIPRO) IVPB 400 mg  Status:  Discontinued  400 mg 200 mL/hr over 60 Minutes Intravenous On call to O.R. 02/05/20 0758 02/05/20 0806   02/05/20 0930  clindamycin (CLEOCIN) IVPB 900 mg     900 mg 100 mL/hr over 30 Minutes Intravenous On call to O.R. 02/05/20 0803 02/05/20 1046   02/05/20 0930  gentamicin (GARAMYCIN) 350 mg in dextrose 5 % 100 mL IVPB     350 mg 108.8 mL/hr over 60 Minutes Intravenous On call to O.R. 02/05/20 0803  02/05/20 1030   02/04/20 0600  clindamycin (CLEOCIN) IVPB 900 mg     900 mg 100 mL/hr over 30 Minutes Intravenous On call to O.R. 02/03/20 1653 02/05/20 0559   02/04/20 0600  gentamicin (GARAMYCIN) 320 mg in dextrose 5 % 100 mL IVPB     320 mg 216 mL/hr over 30 Minutes Intravenous On call to O.R. 02/03/20 1653 02/05/20 0559      Alphonsa Overall, MD, [facility] Surgery Office: [phone] 02/06/2020

## 2020-02-06 NOTE — Plan of Care (Signed)
Instructions were reviewed with patient. All questions were answered. Patient was transported to main entrance by wheelchair. ° °

## 2020-02-06 NOTE — Discharge Summary (Signed)
Discharge Summary  Paula Warner MLY:[id number] DOB: Sep 13, 1986  PCP: Patient, No Pcp Per  Admit date: 02/02/2020 Discharge date: 02/06/2020  Time spent:  11mins  Recommendations for Outpatient Follow-up:  1. F/u with PCP within a week  for hospital discharge follow up, repeat cbc/bmp at follow up 2. F/u with general surgery on 6/1  Discharge Diagnoses:  Active Hospital Problems   Diagnosis Date Noted  . Acute gallstone pancreatitis 02/02/2020    Resolved Hospital Problems  No resolved problems to display.    Discharge Condition: stable  Diet recommendation: regular diet   Filed Weights   02/02/20 2300  Weight: 70.7 kg    History of present illness: (per admitting provider Dr Alcario Drought) Patient coming from: Home  I have personally briefly reviewed patient's old medical records in [facility]  Chief Complaint: Abd pain  HPI: Paula Warner is a 34 y.o. female with medical history significant of prior DVT.  Pt with known chronic cholecystitis with calculous, recent biliary colic and elevated transaminases.  Surgery saw pt on 5/4, planning for Lap chole on 5/19.  MRCP done just last week without any obvious CBD stones.  Presents to ED today with worsening of abd pain.  Epigastric abdomen, radiates to back, nausea with 2-3 episodes of vomiting.   ED Course: PT now with pancreatitis, lipase 2000+, LFTs in the 400s.  CT showing pancreatitis, no CBD dilation.  GI and gen surg consulted, hospitalist asked to admit.  Hospital Course:  Principal Problem:   Acute gallstone pancreatitis  Acute gallstone pancreatitis: Gastroenterology and General surgery consulted No indication for ERCP based on MRCP findings S/p  cholecystectomy on 5/14, doing well post op, she denies pain, no n/v, tolerating regular diet, she is cleared to go home by general surgery With follow up appointment on 6/1.  Hypokalemia/hypomagnesemia, replaced    Procedures: S/p   cholecystectomy on 5/14,  Consultations:  General surgery  Discharge Exam: BP (!) 104/54 (BP Location: Right Arm)   Pulse (!) 58   Temp 98.8 F (37.1 C) (Oral)   Resp 18   Ht 5\' 1"  (1.549 m)   Wt 70.7 kg   SpO2 97%   BMI 29.45 kg/m   General: NAD, pleasant Cardiovascular: RRR Respiratory: CTA BL Abdomen: Postop changes, nontender, no guarding, positive bowel sounds  Discharge Instructions You were cared for by a hospitalist during your hospital stay. If you have any questions about your discharge medications or the care you received while you were in the hospital after you are discharged, you can call the unit and asked to speak with the hospitalist on call if the hospitalist that took care of you is not available. Once you are discharged, your primary care physician will handle any further medical issues. Please note that NO REFILLS for any discharge medications will be authorized once you are discharged, as it is imperative that you return to your primary care physician (or establish a relationship with a primary care physician if you do not have one) for your aftercare needs so that they can reassess your need for medications and monitor your lab values.  Discharge Instructions    Diet general   Complete by: As directed    Increase activity slowly   Complete by: As directed      Allergies as of 02/06/2020      Reactions   Zinacef [cefuroxime] Hives      Medication List    TAKE these medications   acetaminophen 325 MG  tablet Commonly known as: TYLENOL Take 2 tablets (650 mg total) by mouth every 6 (six) hours as needed (pain).   bismuth subsalicylate 124 PY/09XI suspension Commonly known as: PEPTO BISMOL Take 30 mLs by mouth every 6 (six) hours as needed for indigestion or diarrhea or loose stools.   ibuprofen 200 MG tablet Commonly known as: ADVIL Take 400 mg by mouth every 6 (six) hours as needed for headache or moderate pain.   methocarbamol 500 MG  tablet Commonly known as: ROBAXIN Take 1 tablet (500 mg total) by mouth at bedtime as needed for muscle spasms.      Allergies  Allergen Reactions  . Zinacef [Cefuroxime] [facility], [place]. Go on 02/23/2020.   Specialty: General Surgery Why: Your appointment is 06/01 at 11 am. Please arrive 30 minutes prior to your appointment to check in and fill out paperwork. Bring photo ID and insurance information. Contact information: [address] [place] [phone]       f/u with pcp for hospital discarge follow up Follow up in 1 week(s).   Why: pcp to repeat basic lab work including cbc/cmp.           The results of significant diagnostics from this hospitalization (including imaging, microbiology, ancillary and laboratory) are listed below for reference.    Significant Diagnostic Studies: DG Chest 2 View  Result Date: 01/09/2020 CLINICAL DATA:  Mid back pain radiating to the chest and abdomen for the past 2 days. EXAM: CHEST - 2 VIEW COMPARISON:  None. FINDINGS: Normal sized heart. Mild peribronchial thickening. Poor inspiration. Mild linear density at the left lung base. Mild scoliosis. IMPRESSION: 1. Mild bronchitic changes. 2. Poor inspiration with mild left basilar atelectasis. Electronically Signed   By: Claudie Revering M.D.   On: 01/09/2020 12:18   DG Cholangiogram Operative  Result Date: 02/05/2020 CLINICAL DATA:  Intraoperative cholangiogram during laparoscopic cholecystectomy. EXAM: INTRAOPERATIVE CHOLANGIOGRAM FLUOROSCOPY TIME:  17 seconds COMPARISON:  MRCP-04/05/2020 FINDINGS: Intraoperative cholangiographic images of the right upper abdominal quadrant during laparoscopic cholecystectomy are provided for review. Surgical clips overlie the expected location of the gallbladder fossa. Enteric tube tip and side port project over the expected location of mid body of the stomach. Contrast  injection demonstrates selective cannulation of the central aspect of the cystic duct. There is passage of contrast through the central aspect of the cystic duct with filling of a non dilated common bile duct. There is passage of contrast though the CBD and into the descending portion of the duodenum. There is minimal reflux of injected contrast into the common hepatic duct and central aspect of the non dilated intrahepatic biliary system. There are no discrete filling defects within the opacified portions of the biliary system to suggest the presence of choledocholithiasis. IMPRESSION: No evidence of choledocholithiasis. Electronically Signed   By: Sandi Mariscal M.D.   On: 02/05/2020 11:22   CT ANGIO CHEST PE W OR WO CONTRAST  Result Date: 01/09/2020 CLINICAL DATA:  Mid back pain radiating to the chest for 2 days with pleuritic symptoms. Elevated D-dimer. EXAM: CT ANGIOGRAPHY CHEST WITH CONTRAST TECHNIQUE: Multidetector CT imaging of the chest was performed using the standard protocol during bolus administration of intravenous contrast. Multiplanar CT image reconstructions and MIPs were obtained to evaluate the vascular anatomy. CONTRAST:  185mL OMNIPAQUE IOHEXOL 350 MG/ML SOLN COMPARISON:  Chest radiograph from earlier today. FINDINGS: Cardiovascular: The study is high quality for the  evaluation of pulmonary embolism. There are no filling defects in the central, lobar, segmental or subsegmental pulmonary artery branches to suggest acute pulmonary embolism. Great vessels are normal in course and caliber. Top-normal heart size. No significant pericardial fluid/thickening. Mediastinum/Nodes: No discrete thyroid nodules. Unremarkable esophagus. No pathologically enlarged axillary, mediastinal or hilar lymph nodes. Lungs/Pleura: No pneumothorax. No pleural effusion. No acute consolidative airspace disease, lung masses or significant pulmonary nodules. 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 Upper abdomen: Cholelithiasis. Musculoskeletal:  No aggressive appearing focal osseous lesions. Review of the MIP images confirms the above findings. IMPRESSION: 1. No pulmonary embolism. 2. 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 3. Cholelithiasis. Electronically Signed   By: Ilona Sorrel M.D.   On: 01/09/2020 14:55   CT Abdomen Pelvis W Contrast  Result Date: 02/02/2020 CLINICAL DATA:  Upper abdominal pain with nausea and vomiting for several weeks, history of cholelithiasis EXAM: CT ABDOMEN AND PELVIS WITH CONTRAST TECHNIQUE: Multidetector CT imaging of the abdomen and pelvis was performed using the standard protocol following bolus administration of intravenous contrast. CONTRAST:  161mL OMNIPAQUE IOHEXOL 300 MG/ML  SOLN COMPARISON:  04/06/2019 FINDINGS: Lower chest: Mild scarring in the bases bilaterally. No focal infiltrate or sizable effusion is seen. Hepatobiliary: Fatty infiltration of the liver is noted. The gallbladder is again well visualized with multiple faceted gallstones within. No biliary ductal dilatation is seen. Pancreas: Mild peripancreatic inflammatory changes are noted in the region of the head and uncinate process consistent with focal pancreatitis. Spleen: Normal in size without focal abnormality. Adrenals/Urinary Tract: Adrenal glands are within normal limits. Kidneys demonstrate a normal enhancement pattern. Few scattered small cysts are noted. Scattered small renal calculi are noted as well similar to that seen on the prior exam. The largest of these lies in the upper pole of the right kidney measuring 2 mm. Bladder is within normal limits. Stomach/Bowel: The appendix is within normal limits. No obstructive or inflammatory changes of the colon are seen. Small bowel and stomach are unremarkable. Vascular/Lymphatic: No significant vascular findings are present. No enlarged  abdominal or pelvic lymph nodes. Left iliac venous stent is noted. Reproductive: Uterus and bilateral adnexa are unremarkable. Changes of prior tubal ligation are seen Other: No abdominal wall hernia or abnormality. No abdominopelvic ascites. Musculoskeletal: No acute or significant osseous findings. IMPRESSION: Changes of mild acute pancreatitis surrounding pancreatic head and uncinate process. Cholelithiasis without complicating factors. Bilateral small renal calculi without obstructive change. Electronically Signed   By: Inez Catalina M.D.   On: 02/02/2020 21:13   MR ABDOMEN MRCP WO CONTRAST  Result Date: 02/04/2020 CLINICAL DATA:  Cholelithiasis, suspected pancreatitis EXAM: MRI ABDOMEN WITHOUT CONTRAST  (INCLUDING MRCP) TECHNIQUE: Multiplanar multisequence MR imaging of the abdomen was performed. Heavily T2-weighted images of the biliary and pancreatic ducts were obtained, and three-dimensional MRCP images were rendered by post processing. COMPARISON:  CT abdomen and pelvis 02/02/2020 FINDINGS: Lower chest: Incidental imaging of the lung bases with signs of basilar atelectasis or airspace disease on the LEFT not well evaluated on MR. Hepatobiliary: Liver is unremarkable. Numerous gallstones in the gallbladder without pericholecystic stranding. No biliary ductal dilation. Assessment of the biliary tree is limited due to motion on MRCP images. Radial thick slab MRCP sequences without sign of filling defect in the common bile duct. Pancreas: Pancreas with minimal peripancreatic edema about the head and uncinate process. No ductal dilation. No  sign of pancreatic lesion. Spleen:  Spleen is normal size without focal lesion. Adrenals/Urinary Tract: Adrenal glands are normal. Renal contours are smooth without signs of hydronephrosis or renal lesion. Stomach/Bowel: Gastrointestinal tract is unremarkable to the extent visualized with limited assessment on MRI. Vascular/Lymphatic: Vascular structures in the abdomen are  grossly patent. Other:  No ascites. Musculoskeletal: No suspicious bone lesions identified. IMPRESSION: 1. Mild peripancreatic edema about the head and uncinate process, no fluid collection or change in T1 signal of pancreatic parenchyma beyond subtle decreased signal in the area of edema findings suggest mild acute interstitial pancreatitis. 2. Assessment of the biliary tree is limited due to motion on MRCP images. No sign of filling defect in the common bile duct. No biliary ductal dilation. 3. Cholelithiasis without current signs of acute cholecystitis. Study limited by lack of intravenous contrast. 4. Incidental signs of basilar atelectasis or airspace disease in the LEFT lung base not well evaluated on MR. Electronically Signed   By: Zetta Bills M.D.   On: 02/04/2020 11:40   MR ABDOMEN MRCP WO CONTRAST  Result Date: 01/11/2020 CLINICAL DATA:  34 year old female with history of abdominal pain. Cholelithiasis noted on recent ultrasound examination. EXAM: MRI ABDOMEN WITHOUT CONTRAST  (INCLUDING MRCP) TECHNIQUE: Multiplanar multisequence MR imaging of the abdomen was performed. Heavily T2-weighted images of the biliary and pancreatic ducts were obtained, and three-dimensional MRCP images were rendered by post processing. COMPARISON:  No prior abdominal MRI. Abdominal ultrasound 01/09/2020. FINDINGS: Comment: Today's study is limited for detection and characterization of visceral and/or vascular lesions by lack of IV gadolinium. Lower chest: Unremarkable. Hepatobiliary: No suspicious cystic or solid hepatic lesions are confidently identified on today's noncontrast examination. No intra or extrahepatic biliary ductal dilatation. Common bile duct measures 3 mm in the porta hepatis. No filling defect in the common bile duct. There multiple filling defects filling the lumen of the gallbladder, compatible with cholelithiasis. No gallbladder wall thickening or pericholecystic fluid. Pancreas: No definite  pancreatic mass. No pancreatic ductal dilatation noted on MRCP images. No pancreatic or peripancreatic fluid collections or inflammatory changes. Spleen:  Unremarkable. Adrenals/Urinary Tract: 7 mm T1 hypointense, T2 hyperintense lesion in the upper pole the left kidney, incompletely characterized on today's noncontrast examination, but statistically likely to represent a cyst. Right kidney and bilateral adrenal glands are normal in appearance. No hydroureteronephrosis in the visualized portions of the abdomen. Stomach/Bowel: Visualized portions are unremarkable. Vascular/Lymphatic: No aneurysm identified in the visualized abdominal vasculature. No lymphadenopathy noted in the abdomen. Other: No significant volume of ascites noted in the visualized portions of the peritoneal cavity. Musculoskeletal: No aggressive appearing osseous lesions are noted in the visualized portions of the skeleton. IMPRESSION: 1. Cholelithiasis without evidence of acute cholecystitis at this time. 2. No evidence of cholelithiasis or findings to suggest biliary tract obstruction. Electronically Signed   By: Vinnie Langton M.D.   On: 01/11/2020 14:39   MR 3D Recon At Scanner  Result Date: 02/04/2020 CLINICAL DATA:  Cholelithiasis, suspected pancreatitis EXAM: MRI ABDOMEN WITHOUT CONTRAST  (INCLUDING MRCP) TECHNIQUE: Multiplanar multisequence MR imaging of the abdomen was performed. Heavily T2-weighted images of the biliary and pancreatic ducts were obtained, and three-dimensional MRCP images were rendered by post processing. COMPARISON:  CT abdomen and pelvis 02/02/2020 FINDINGS: Lower chest: Incidental imaging of the lung bases with signs of basilar atelectasis or airspace disease on the LEFT not well evaluated on MR. Hepatobiliary: Liver is unremarkable. Numerous gallstones in the gallbladder without pericholecystic stranding. No biliary ductal dilation. Assessment of  the biliary tree is limited due to motion on MRCP images. Radial  thick slab MRCP sequences without sign of filling defect in the common bile duct. Pancreas: Pancreas with minimal peripancreatic edema about the head and uncinate process. No ductal dilation. No sign of pancreatic lesion. Spleen:  Spleen is normal size without focal lesion. Adrenals/Urinary Tract: Adrenal glands are normal. Renal contours are smooth without signs of hydronephrosis or renal lesion. Stomach/Bowel: Gastrointestinal tract is unremarkable to the extent visualized with limited assessment on MRI. Vascular/Lymphatic: Vascular structures in the abdomen are grossly patent. Other:  No ascites. Musculoskeletal: No suspicious bone lesions identified. IMPRESSION: 1. Mild peripancreatic edema about the head and uncinate process, no fluid collection or change in T1 signal of pancreatic parenchyma beyond subtle decreased signal in the area of edema findings suggest mild acute interstitial pancreatitis. 2. Assessment of the biliary tree is limited due to motion on MRCP images. No sign of filling defect in the common bile duct. No biliary ductal dilation. 3. Cholelithiasis without current signs of acute cholecystitis. Study limited by lack of intravenous contrast. 4. Incidental signs of basilar atelectasis or airspace disease in the LEFT lung base not well evaluated on MR. Electronically Signed   By: Zetta Bills M.D.   On: 02/04/2020 11:40   MR 3D Recon At Scanner  Result Date: 01/11/2020 CLINICAL DATA:  34 year old female with history of abdominal pain. Cholelithiasis noted on recent ultrasound examination. EXAM: MRI ABDOMEN WITHOUT CONTRAST  (INCLUDING MRCP) TECHNIQUE: Multiplanar multisequence MR imaging of the abdomen was performed. Heavily T2-weighted images of the biliary and pancreatic ducts were obtained, and three-dimensional MRCP images were rendered by post processing. COMPARISON:  No prior abdominal MRI. Abdominal ultrasound 01/09/2020. FINDINGS: Comment: Today's study is limited for detection and  characterization of visceral and/or vascular lesions by lack of IV gadolinium. Lower chest: Unremarkable. Hepatobiliary: No suspicious cystic or solid hepatic lesions are confidently identified on today's noncontrast examination. No intra or extrahepatic biliary ductal dilatation. Common bile duct measures 3 mm in the porta hepatis. No filling defect in the common bile duct. There multiple filling defects filling the lumen of the gallbladder, compatible with cholelithiasis. No gallbladder wall thickening or pericholecystic fluid. Pancreas: No definite pancreatic mass. No pancreatic ductal dilatation noted on MRCP images. No pancreatic or peripancreatic fluid collections or inflammatory changes. Spleen:  Unremarkable. Adrenals/Urinary Tract: 7 mm T1 hypointense, T2 hyperintense lesion in the upper pole the left kidney, incompletely characterized on today's noncontrast examination, but statistically likely to represent a cyst. Right kidney and bilateral adrenal glands are normal in appearance. No hydroureteronephrosis in the visualized portions of the abdomen. Stomach/Bowel: Visualized portions are unremarkable. Vascular/Lymphatic: No aneurysm identified in the visualized abdominal vasculature. No lymphadenopathy noted in the abdomen. Other: No significant volume of ascites noted in the visualized portions of the peritoneal cavity. Musculoskeletal: No aggressive appearing osseous lesions are noted in the visualized portions of the skeleton. IMPRESSION: 1. Cholelithiasis without evidence of acute cholecystitis at this time. 2. No evidence of cholelithiasis or findings to suggest biliary tract obstruction. Electronically Signed   By: Vinnie Langton M.D.   On: 01/11/2020 14:39   US Abdomen Limited RUQ  Result Date: 01/09/2020 CLINICAL DATA:  Elevated liver function tests. EXAM: ULTRASOUND ABDOMEN LIMITED RIGHT UPPER QUADRANT COMPARISON:  April 06, 2019. FINDINGS: Gallbladder: The gallbladder lumen is full of  gallstones without significant gallbladder wall thickening or pericholecystic fluid. Sonographic Murphy's sign could not be assessed as the patient had received morphine. Common bile duct:  Diameter: 4 mm which is within normal limits. Liver: No focal lesion identified. Within normal limits in parenchymal echogenicity. Portal vein is patent on color Doppler imaging with normal direction of blood flow towards the liver. Other: None. IMPRESSION: Cholelithiasis is noted.  No definite evidence of cholecystitis. Electronically Signed   By: Marijo Conception M.D.   On: 01/09/2020 14:05    Microbiology: Recent Results (from the past 240 hour(s))  SARS Coronavirus 2 by RT PCR (hospital order, performed in [facility] hospital lab) Nasopharyngeal Nasopharyngeal Swab     Status: None   Collection Time: 02/02/20  7:42 PM   Specimen: Nasopharyngeal Swab  Result Value Ref Range Status   SARS Coronavirus 2 NEGATIVE NEGATIVE Final    Comment: (NOTE) SARS-CoV-2 target nucleic acids are NOT DETECTED. The SARS-CoV-2 RNA is generally detectable in upper and lower respiratory specimens during the acute phase of infection. The lowest concentration of SARS-CoV-2 viral copies this assay can detect is 250 copies / mL. A negative result does not preclude SARS-CoV-2 infection and should not be used as the sole basis for treatment or other patient management decisions.  A negative result may occur with improper specimen collection / handling, submission of specimen other than nasopharyngeal swab, presence of viral mutation(s) within the areas targeted by this assay, and inadequate number of viral copies (<250 copies / mL). A negative result must be combined with clinical observations, patient history, and epidemiological information. Fact Sheet for Patients:   StrictlyIdeas.no Fact Sheet for Healthcare Providers: BankingDealers.co.za This test is not yet approved or cleared   by the [place] FDA and has been authorized for detection and/or diagnosis of SARS-CoV-2 by FDA under an Emergency Use Authorization (EUA).  This EUA will remain in effect (meaning this test can be used) for the duration of the COVID-19 declaration under Section 564(b)(1) of the Act, 21 U.S.C. section 360bbb-3(b)(1), unless the authorization is terminated or revoked sooner. Performed at [facility], [place] [address] [place], [place] [address]   Surgical pcr screen     Status: None   Collection Time: 02/03/20 10:08 PM   Specimen: Nasal Mucosa; Nasal Swab  Result Value Ref Range Status   MRSA, PCR NEGATIVE NEGATIVE Final   Staphylococcus aureus NEGATIVE NEGATIVE Final    Comment: (NOTE) The Xpert SA Assay (FDA approved for NASAL specimens in patients 85 years of age and older), is one component of a comprehensive surveillance program. It is not intended to diagnose infection nor to guide or monitor treatment. Performed at [facility], [place] [address], [place], [place] [address]      Labs: Basic Metabolic Panel: Recent Labs  Lab 02/03/20 0420 02/04/20 0428 02/04/20 1202 02/05/20 0451 02/06/20 0521  NA 142 137 141 139 141  K 3.3* 3.6 3.8 3.7 3.8  CL 109 110 110 107 111  CO2 24 23 24 27 24   GLUCOSE 98 85 87 108* 119*  BUN 11 5* <5* <5* 10  CREATININE 0.49 0.54 0.54 0.60 0.58  CALCIUM 8.7* 8.3* 8.7* 8.8* 9.3  MG  --   --   --  1.9 1.8   Liver Function Tests: Recent Labs  Lab 02/03/20 0420 02/04/20 0428 02/04/20 1202 02/05/20 0451 02/06/20 0521  AST 300* 128* 108* 66* 51*  ALT 363* 261* 254* 196* 162*  ALKPHOS 135* 145* 158* 141* 125  BILITOT 4.6* 4.1* 4.0* 2.9* 1.1  PROT 7.0 6.2* 7.0 6.5 6.9  ALBUMIN 3.7 3.2* 3.7 3.4* 3.5  Recent Labs  Lab 02/02/20 1637 02/03/20 0435 02/04/20 0428 02/05/20 0451  LIPASE 2,172* 325* 56* 29   No results for input(s): AMMONIA in the last 168 hours. CBC: Recent Labs  Lab  02/03/20 0420 02/04/20 0428 02/05/20 0451 02/05/20 1348 02/06/20 0521  WBC 7.8 7.1 7.9 11.9* 10.3  NEUTROABS  --   --   --  10.8* 6.2  HGB 13.3 12.7 13.0 13.9 12.7  HCT 39.6 38.0 39.5 42.1 39.2  MCV 92.7 95.7 95.9 94.0 95.6  PLT 213 210 188 206 208   Cardiac Enzymes: No results for input(s): CKTOTAL, CKMB, CKMBINDEX, TROPONINI in the last 168 hours. BNP: BNP (last 3 results) No results for input(s): BNP in the last 8760 hours.  ProBNP (last 3 results) No results for input(s): PROBNP in the last 8760 hours.  CBG: No results for input(s): GLUCAP in the last 168 hours.     Signed:  Florencia Reasons MD, PhD, FACP  [facility] 02/06/2020, 10:53 AM

## 2020-02-08 ENCOUNTER — Encounter: Payer: Self-pay | Admitting: *Deleted

## 2020-02-08 LAB — SURGICAL PATHOLOGY

## 2020-02-08 NOTE — Anesthesia Postprocedure Evaluation (Signed)
Anesthesia Post Note  Patient: Paula Warner  Procedure(s) Performed: LAPAROSCOPIC CHOLECYSTECTOMY WITH POSSIBLE INTRAOPERATIVE CHOLANGIOGRAM (N/A )     Patient location during evaluation: PACU Anesthesia Type: General Level of consciousness: awake and alert Pain management: pain level controlled Vital Signs Assessment: post-procedure vital signs reviewed and stable Respiratory status: spontaneous breathing, nonlabored ventilation, respiratory function stable and patient connected to nasal cannula oxygen Cardiovascular status: blood pressure returned to baseline and stable Postop Assessment: no apparent nausea or vomiting Anesthetic complications: no    Last Vitals:  Vitals:   02/05/20 2146 02/06/20 0523  BP: 115/68 (!) 104/54  Pulse: 76 (!) 58  Resp: 18 18  Temp: 37.2 C 37.1 C  SpO2: 94% 97%    Last Pain:  Vitals:   02/06/20 0918  TempSrc:   PainSc: 0-No pain                 Paulita Licklider L Twanna Resh

## 2020-02-10 ENCOUNTER — Ambulatory Visit (HOSPITAL_COMMUNITY): Admission: RE | Admit: 2020-02-10 | Payer: 59 | Source: Ambulatory Visit | Admitting: Surgery

## 2020-02-10 ENCOUNTER — Encounter (HOSPITAL_COMMUNITY): Admission: RE | Payer: Self-pay | Source: Ambulatory Visit

## 2020-02-10 SURGERY — LAPAROSCOPIC CHOLECYSTECTOMY WITH INTRAOPERATIVE CHOLANGIOGRAM
Anesthesia: General

## 2020-04-11 ENCOUNTER — Other Ambulatory Visit (HOSPITAL_COMMUNITY)
Admission: RE | Admit: 2020-04-11 | Discharge: 2020-04-11 | Disposition: A | Discharge status: Home / Self Care | Payer: 59 | Source: Ambulatory Visit | Attending: Advanced Practice Midwife | Admitting: Advanced Practice Midwife

## 2020-04-11 ENCOUNTER — Other Ambulatory Visit: Payer: Self-pay

## 2020-04-11 ENCOUNTER — Encounter: Payer: Self-pay | Admitting: Advanced Practice Midwife

## 2020-04-11 ENCOUNTER — Ambulatory Visit (HOSPITAL_BASED_OUTPATIENT_CLINIC_OR_DEPARTMENT_OTHER): Payer: 59 | Admitting: Advanced Practice Midwife

## 2020-04-11 ENCOUNTER — Other Ambulatory Visit: Payer: Self-pay | Admitting: Advanced Practice Midwife

## 2020-04-11 VITALS — BP 111/74 | HR 68 | Ht 62.0 in | Wt 157.0 lb

## 2020-04-11 DIAGNOSIS — Z113 Encounter for screening for infections with a predominantly sexual mode of transmission: Secondary | ICD-10-CM

## 2020-04-11 DIAGNOSIS — Z01419 Encounter for gynecological examination (general) (routine) without abnormal findings: Secondary | ICD-10-CM

## 2020-04-11 DIAGNOSIS — N911 Secondary amenorrhea: Secondary | ICD-10-CM

## 2020-04-11 DIAGNOSIS — N939 Abnormal uterine and vaginal bleeding, unspecified: Secondary | ICD-10-CM

## 2020-04-11 DIAGNOSIS — Z9851 Tubal ligation status: Secondary | ICD-10-CM

## 2020-04-11 DIAGNOSIS — N898 Other specified noninflammatory disorders of vagina: Secondary | ICD-10-CM

## 2020-04-11 DIAGNOSIS — N393 Stress incontinence (female) (male): Secondary | ICD-10-CM

## 2020-04-11 MED ORDER — MEDROXYPROGESTERONE ACETATE 10 MG PO TABS: 10.0000 mg | ORAL_TABLET | Freq: Every day | ORAL | 3 refills | Status: DC

## 2020-04-11 MED ORDER — MEDROXYPROGESTERONE ACETATE 10 MG PO TABS: 10.0000 mg | ORAL_TABLET | Freq: Every day | ORAL | 0 refills | Status: DC

## 2020-04-11 NOTE — Progress Notes (Signed)
Last pap 10/28/2018- Normal  Pt has had BTL -  Is having irregular cycles - last cycle approx 2 mo ago Pt states increase in d/c - watery, denies itch/odor.

## 2020-04-11 NOTE — Progress Notes (Signed)
Subjective:     Paula Warner is a 34 y.o. female here at [facility] for a routine exam.  Current complaints: skipping periods, leaking/discharge after urinating.      Gynecologic History No LMP recorded. (Menstrual status: Irregular Periods). Contraception: tubal ligation Last Pap: 2020. Results were: normal   Obstetric History OB History  Gravida Para Term Preterm AB Living  3 1     1 2   SAB TAB Ectopic Multiple Live Births    1   0 2    # Outcome Date GA Lbr Len/2nd Weight Sex Delivery Anes PTL Lv  3 TAB 04/29/15          2 Gravida 04/15/10 [redacted]w[redacted]d  6 lb 6 oz (2.892 kg) M CS-LTranv Spinal N LIV  1 Para 12/01/04 [redacted]w[redacted]d  6 lb 7 oz (2.92 kg) M CS-LTranv Spinal N LIV     Complications: Dysfunctional Labor     The following portions of the patient's history were reviewed and updated as appropriate: allergies, current medications, past family history, past medical history, past social history, past surgical history and problem list.  Review of Systems Pertinent items noted in HPI and remainder of comprehensive ROS otherwise negative.    Objective:     VS reviewed, nursing note reviewed,  Constitutional: well developed, well nourished, no distress HEENT: normocephalic HEART: normal rate, heart sounds, regular rhythm RESP: normal effort, lung sounds clear and equal bilaterally Breast Exam:  Deferred with shared decision making Abdomen: soft Neuro: alert and oriented x 3 Skin: warm, dry Psych: affect normal Pelvic exam: Cervix pink, visually closed, without lesion, scant white creamy discharge, vaginal walls and external genitalia normal Bimanual exam: Cervix 0/long/high, firm, anterior, neg CMT, uterus nontender, nonenlarged, adnexa without tenderness, enlargement, or mass     Assessment/Plan:   1. Screen for STD (sexually transmitted disease)  - Cervicovaginal ancillary only( ) - HIV Antibody (routine testing w rflx) - Hepatitis B surface antigen - RPR -  Hepatitis C antibody  2. Vaginal discharge --Discharge vs urine leakage  3. Well woman exam with routine gynecological exam --Last Pap 10/28/2018  4. Abnormal uterine bleeding (AUB) - TSH - medroxyPROGESTERone (PROVERA) 10 MG tablet; Take 1 tablet (10 mg total) by mouth daily.  Dispense: 10 tablet; Refill: 3  5. Stress incontinence in female --Pt leaking is most c/w urine leakage as she also has stress incontinence with cough/sneeze/laugh.   - Ambulatory referral to Physical Therapy  6. Secondary amenorrhea --Pt concerned that she skips months with her periods. This is unchanged for patient in several years.  She typically skips 1-2 months then has normal period.   --Pt to use Provera 10 days if period missed 2 months.  Discussed risks of proliferation of uterus but with slightly irregular menses, missing only 1-2 months, and normal menses when they occur, this is unlikely an issue. - medroxyPROGESTERone (PROVERA) 10 MG tablet; Take 1 tablet (10 mg total) by mouth daily.  Dispense: 10 tablet; Refill: 3  Follow up in: 3 months with MD or as needed.   Fatima Blank, CNM 2:36 PM

## 2020-04-12 LAB — CERVICOVAGINAL ANCILLARY ONLY
Bacterial Vaginitis (gardnerella): NEGATIVE
Candida Glabrata: NEGATIVE
Candida Vaginitis: NEGATIVE
Chlamydia: NEGATIVE
Comment: NEGATIVE
Comment: NEGATIVE
Comment: NEGATIVE
Comment: NEGATIVE
Comment: NEGATIVE
Comment: NORMAL
Neisseria Gonorrhea: NEGATIVE
Trichomonas: NEGATIVE

## 2020-04-12 LAB — HEPATITIS C ANTIBODY: Hep C Virus Ab: 0.1 s/co ratio (ref 0.0–0.9)

## 2020-04-12 LAB — RPR: RPR Ser Ql: NONREACTIVE

## 2020-04-12 LAB — TSH: TSH: 1.54 u[IU]/mL (ref 0.450–4.500)

## 2020-04-12 LAB — HEPATITIS B SURFACE ANTIGEN: Hepatitis B Surface Ag: NEGATIVE

## 2020-04-12 LAB — HIV ANTIBODY (ROUTINE TESTING W REFLEX): HIV Screen 4th Generation wRfx: NONREACTIVE

## 2020-06-28 ENCOUNTER — Encounter: Payer: Self-pay | Admitting: Podiatry

## 2020-06-28 ENCOUNTER — Other Ambulatory Visit: Payer: Self-pay

## 2020-06-28 ENCOUNTER — Ambulatory Visit (INDEPENDENT_AMBULATORY_CARE_PROVIDER_SITE_OTHER): Payer: Self-pay | Admitting: Podiatry

## 2020-06-28 DIAGNOSIS — B353 Tinea pedis: Secondary | ICD-10-CM

## 2020-06-28 DIAGNOSIS — R61 Generalized hyperhidrosis: Secondary | ICD-10-CM

## 2020-06-28 MED ORDER — TERBINAFINE HCL 250 MG PO TABS: 250.0000 mg | ORAL_TABLET | Freq: Every day | ORAL | 0 refills | Status: DC

## 2020-06-28 MED ORDER — KETOCONAZOLE 2 % EX CREA
1.0000 | TOPICAL_CREAM | Freq: Every day | CUTANEOUS | 2 refills | Status: DC
Start: 2020-06-28 — End: 2020-08-29

## 2020-06-28 NOTE — Patient Instructions (Signed)
Look at getting an antiperspirant spray to apply to feet daily Make sure you change your shoes/sock daily   Athlete's Foot  Athlete's foot (tinea pedis) is a fungal infection of the skin on your feet. It often occurs on the skin that is between or underneath your toes. It can also occur on the soles of your feet. Symptoms include itchy or white and flaky areas on the skin. The infection can spread from person to person (is contagious). It can also spread when a person's bare feet come in contact with the fungus on shower floors or on items such as shoes. Follow these instructions at home: Medicines  Apply or take over-the-counter and prescription medicines only as told by your doctor.  Apply your antifungal medicine as told by your doctor. Do not stop using the medicine even if your feet start to get better. Foot care  Do not scratch your feet.  Keep your feet dry: ? Wear cotton or wool socks. Change your socks every day or if they become wet. ? Wear shoes that allow air to move around, such as sandals or canvas tennis shoes.  Wash and dry your feet: ? Every day or as told by your doctor. ? After exercising. ? Including the area between your toes. General instructions  Do not share any of these items that touch your feet: ? Towels. ? Shoes. ? Nail clippers. ? Other personal items.  Protect your feet by wearing sandals in wet areas, such as locker rooms and shared showers.  Keep all follow-up visits as told by your doctor. This is important.  If you have diabetes, keep your blood sugar under control. Contact a doctor if:  You have a fever.  You have swelling, pain, warmth, or redness in your foot.  Your feet are not getting better with treatment.  Your symptoms get worse.  You have new symptoms. Summary  Athlete's foot is a fungal infection of the skin on your feet.  Symptoms include itchy or white and flaky areas on the skin.  Apply your antifungal medicine as  told by your doctor.  Keep your feet clean and dry. This information is not intended to replace advice given to you by your health care provider. Make sure you discuss any questions you have with your health care provider. Document Revised: 09/05/2017 Document Reviewed: 07/01/2017 Elsevier Patient Education  [place].

## 2020-06-29 NOTE — Progress Notes (Signed)
Subjective:   Patient ID: Paula Warner, female   DOB: 34 y.o.   MRN: [id number]   HPI 34 year old female presents the office today for concerns of chronic athlete's foot.  She was seen 2017 by Dr. Prudence Davidson for the same issue and she states that over the last year she has been dealing with athlete's foot.  She states that it will get itchy but then she will develop little blisters that will have pus.  Currently denies any blisters.  No concern swelling no red streaks.  She tried multiple over-the-counter treatments with insignificant improvement.  Also states that her feet sweat.  She has no other concerns today.   Review of Systems  All other systems reviewed and are negative.  Past Medical History:  Diagnosis Date  . Asthma   . History of asthma    no current med.  Marland Kitchen 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  . History of kidney stones   . Menstrual periods irregular     Past Surgical History:  Procedure Laterality Date  . CESAREAN SECTION     x 2  . CHOLECYSTECTOMY N/A 02/05/2020   Procedure: LAPAROSCOPIC CHOLECYSTECTOMY WITH POSSIBLE INTRAOPERATIVE CHOLANGIOGRAM;  Surgeon: Michael Boston, MD;  Location: [facility];  Service: General;  Laterality: N/A;  . LAPAROSCOPIC TUBAL LIGATION Bilateral 05/08/2017   Procedure: LAPAROSCOPIC TUBAL LIGATION - FILSHIE CLIPS;  Surgeon: Emily Filbert, MD;  Location: [facility];  Service: Gynecology;  Laterality: Bilateral;  . LOWER EXTREMITY ANGIOGRAM Left 07/11/2016   Procedure: Percutaneous Venous Thrombectomy with IVUS and  with Stent Placement;  Surgeon: Serafina Mitchell, MD;  Location: [facility] OR;  Service: Vascular;  Laterality: Left;     Current Outpatient Medications:  .  acetaminophen (TYLENOL) 325 MG tablet, Take 2 tablets (650 mg total) by mouth every 6 (six) hours as needed (pain). (Patient not taking: Reported on 04/11/2020), Disp: 30 tablet, Rfl: 0 .  bismuth subsalicylate (PEPTO BISMOL) 262 MG/15ML suspension, Take 30 mLs by  mouth every 6 (six) hours as needed for indigestion or diarrhea or loose stools. (Patient not taking: Reported on 04/11/2020), Disp: , Rfl:  .  ibuprofen (ADVIL) 200 MG tablet, Take 400 mg by mouth every 6 (six) hours as needed for headache or moderate pain. (Patient not taking: Reported on 04/11/2020), Disp: , Rfl:  .  ketoconazole (NIZORAL) 2 % cream, Apply 1 application topically daily., Disp: 60 g, Rfl: 2 .  medroxyPROGESTERone (PROVERA) 10 MG tablet, Take 1 tablet (10 mg total) by mouth daily., Disp: 10 tablet, Rfl: 3 .  methocarbamol (ROBAXIN) 500 MG tablet, Take 1 tablet (500 mg total) by mouth at bedtime as needed for muscle spasms. (Patient not taking: Reported on 04/11/2020), Disp: 10 tablet, Rfl: 0 .  predniSONE (DELTASONE) 20 MG tablet, Take 20 mg by mouth daily., Disp: , Rfl:  .  terbinafine (LAMISIL) 250 MG tablet, Take 1 tablet (250 mg total) by mouth daily., Disp: 14 tablet, Rfl: 0  Allergies  Allergen Reactions  . Zinacef [Cefuroxime] Hives  . Pollen Extract          Objective:  Physical Exam  General: AAO x3, NAD  Dermatological: Dry, scaly, erythematous skin interdigitally consistent with tinea pedis.  Small amount of macerated tissue interdigitally.  There is no pustules identified there is no edema, erythema, ascending cellulitis.  Vascular: Dorsalis Pedis artery and Posterior Tibial artery pedal pulses are 2/4 bilateral with immedate capillary fill time. There is no pain with calf compression,  swelling, warmth, erythema.   Neruologic: Grossly intact via light touch bilateral.   Musculoskeletal: No gross boney pedal deformities bilateral. No pain, crepitus, or limitation noted with foot and ankle range of motion bilateral. Muscular strength 5/5 in all groups tested bilateral.  Gait: Unassisted, Nonantalgic.       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  Hospital ketoconazole cream.  Discussed using antiperspirant spray to help with moisture as well as changing socks and shoes regularly.  She continue with small warm water with a small amount of vinegar soaking as well.  Trula Slade DPM

## 2020-07-12 ENCOUNTER — Telehealth: Payer: Self-pay | Admitting: Physical Therapy

## 2020-07-12 ENCOUNTER — Encounter: Payer: 59 | Attending: Advanced Practice Midwife | Admitting: Physical Therapy

## 2020-07-12 DIAGNOSIS — N393 Stress incontinence (female) (male): Secondary | ICD-10-CM | POA: Insufficient documentation

## 2020-07-12 DIAGNOSIS — R278 Other lack of coordination: Secondary | ICD-10-CM | POA: Insufficient documentation

## 2020-07-12 DIAGNOSIS — M6281 Muscle weakness (generalized): Secondary | ICD-10-CM | POA: Insufficient documentation

## 2020-07-12 NOTE — Telephone Encounter (Signed)
Called patient and spoke to her. She did not come to her appointment today due to transportation. We will be calling her to set up transportation for her next visit on 07/19/2020.  Earlie Counts, PT @10 /19/2021@ 9:41 AM

## 2020-07-18 ENCOUNTER — Telehealth: Payer: Self-pay | Admitting: Cardiology

## 2020-07-18 NOTE — Telephone Encounter (Signed)
°   Paula Warner DOB: [birth year] MRN: [id number]   RIDER WAIVER AND RELEASE OF LIABILITY  For purposes of improving physical access to our facilities, [facility] is pleased to partner with third parties to provide [facility] patients or other authorized individuals the option of convenient, on-demand ground transportation services (the [employer]) through use of the technology service that enables users to request on-demand ground transportation from independent third-party providers.  By opting to use and accept these [employer], I, the undersigned, hereby agree on behalf of myself, and on behalf of any minor child using the [employer] for whom I am the parent or legal guardian, as follows:  1. Government social research officer provided to me are provided by independent third-party transportation providers who are not [employer] or employees and who are unaffiliated with [employer]. 2. [facility] is neither a transportation carrier nor a common or public carrier. 3. [facility] has no control over the quality or safety of the transportation that occurs as a result of the [employer]. 4. [facility] cannot guarantee that any third-party transportation provider will complete any arranged transportation service. 5. [facility] makes no representation, warranty, or guarantee regarding the reliability, timeliness, quality, safety, suitability, or availability of any of the Transport Services or that they will be error free. 6. I fully understand that traveling by vehicle involves risks and dangers of serious bodily injury, including permanent disability, paralysis, and death. I agree, on behalf of myself and on behalf of any minor child using the Transport Services for whom I am the parent or legal guardian, that the entire risk arising out of my use of the [employer] remains solely with me, to the maximum extent permitted under applicable law. 7. The [employer] are provided as is and as available. [facility] disclaims all representations and warranties, express, implied or statutory, not expressly set out in these terms, including the implied warranties of merchantability and fitness for a particular purpose. 8. I hereby waive and release [facility], its agents, employees, officers, directors, representatives, insurers, attorneys, assigns, successors, subsidiaries, and affiliates from any and all past, present, or future claims, demands, liabilities, actions, causes of action, or suits of any kind directly or indirectly arising from acceptance and use of the [employer]. 9. I further waive and release [place] and its affiliates from all present and future liability and responsibility for any injury or death to persons or damages to property caused by or related to the use of the [employer]. 10. I have read this Waiver and Release of Liability, and I understand the terms used in it and their legal significance. This Waiver is freely and voluntarily given with the understanding that my right (as well as the right of any minor child for whom I am the parent or legal guardian using the [employer]) to legal recourse against [place] in connection with the [employer] is knowingly surrendered in return for use of these services.   I attest that I read the consent document to Paula Warner, gave Paula Warner the opportunity to ask questions and answered the questions asked (if any). I affirm that Paula Warner then provided consent for she's participation in this program.     Paula Warner

## 2020-07-19 ENCOUNTER — Other Ambulatory Visit: Payer: Self-pay

## 2020-07-19 ENCOUNTER — Encounter: Payer: 59 | Admitting: Physical Therapy

## 2020-07-19 ENCOUNTER — Encounter: Payer: Self-pay | Admitting: Physical Therapy

## 2020-07-19 DIAGNOSIS — N393 Stress incontinence (female) (male): Secondary | ICD-10-CM | POA: Diagnosis present

## 2020-07-19 DIAGNOSIS — R278 Other lack of coordination: Secondary | ICD-10-CM | POA: Diagnosis present

## 2020-07-19 DIAGNOSIS — M6281 Muscle weakness (generalized): Secondary | ICD-10-CM

## 2020-07-19 NOTE — Patient Instructions (Addendum)
Moisturizers . Ingredients to avoid is glycerin and fragrance, can increase chance of infection Use daily on the vulva and labia  Creams to use externally on the Vulva area  V-magic cream - amazon  Julva-amazon  Vital "V [place] salve ( help moisturize and help with thinning vulvar area, does have Welling by Irwin Brakeman labial moisturizer ([place],   Coconut or olive oil  Aloe  Yes  Vitamin E   Things to avoid in the vaginal area . Do not use things to irritate the vulvar area . No lotions just specialized creams for the vulva area- Neogyn, V-magic,  . No soaps; can use Aveeno or Calendula cleanser if needed. Must be gentle . No deodorants . No douches . Good to sleep without underwear to let the vaginal area to air out . No scrubbing: spread the lips to let warm water rinse over labias and pat dry   Lubrication . Used for intercourse to reduce friction . Avoid ones that have glycerin, warming gels, tingling gels, icing or cooling gel, scented . Avoid parabens due to a preservative similar to female sex hormone . May need to be reapplied once or several times during sexual activity . Can be applied to both partners genitals prior to vaginal penetration to minimize friction or irritation . Prevent irritation and mucosal tears that cause post coital pain and increased the risk of vaginal and urinary tract infections . Oil-based lubricants cannot be used with condoms due to breaking them down.  Least likely to irritate vaginal tissue.  . Plant based-lubes are safe . Silicone-based lubrication are thicker and last long and used for post-menopausal women  Vaginal Lubricators Here is a list of some suggested lubricators you can use for intercourse. Use the most hypoallergenic product.  You can place on you or your partner.   Slippery Stuff ( water based)  Sylk or Sliquid Natural H2O ( good  if  frequent UTI's)- [employer], amazon  Sliquid organics silk-(aloe and silicone based )  [employer] (www.blossom-organics.com)- (aloe based )  Coconut oil, olive oil -not good with condoms   PJur Woman Nude- (water based) amazon  Uberlube- ( silicon) [place] has an organic one  Yes lubricant- (water based and has plant oil based similar to silicone) [employer] Platinum-Silicone, Target, [facility]  Olive and Bee intimate cream-  www.oliveandbee.com.au  [place] Things to avoid in lubricants are glycerin, warming gels, tingling gels, icing or cooling  gels, and scented gels.  Also avoid Vaseline. KY jelly, Replens, and Astroglide kills good bacteria(lactobacilli)  Things to avoid in the vaginal area . Do not use things to irritate the vulvar area . No lotions- see below . No soaps; can use Aveeno or Calendula cleanser if needed. Must be gentle . No deodorants . No douches . Good to sleep without underwear to let the vaginal area to air out . No scrubbing: spread the lips to let warm water rinse over labias and pat dry  Certain foods and liquids will decrease the pH making the urine more acidic.  Urinary urgency increases when the urine has a low pH.  Most common irritants: alcohol, carbonated beverages and caffinated beverages.  Foods to avoid: apple juice, apples, ascorbic acid, canteloupes, chili, citrus fruits, coffee, cranberries, grapes, guava, peaches, pepper, pineapple, plums, strawberries, tea, tomatoes, and vinegar.  Drinking plenty  of water may help to increase the pH and dilute out any of the effects of specific irritants.  Foods that are NOT irritating to the bladder include: Pears, papayas, sun-brewed teas, watermelons, non-citrus herbal teas, apricots, kava and low-acid instant drinks ([employer])   Access Code: [email or web address] URL: [email or web address] Date: 07/19/2020 Prepared  by: Earlie Counts  Exercises Supine Diaphragmatic Breathing - 1 x daily - 7 x weekly - 1 sets - 10 reps Earlie Counts, PT [facility] [address], [place], [place] [address] W: [phone] [email or web address]

## 2020-07-19 NOTE — Therapy (Addendum)
[place] [facility] [address], [place], [place], [address] Phone: [phone]   Fax:  [phone]  Physical Therapy Evaluation  Patient Details  Name: Paula Warner MRN: [id number] Date of Birth: 1986-02-25 Referring Provider (PT): Fatima Blank CNM   Encounter Date: 07/19/2020   PT End of Session - 07/19/20 0848    Visit Number 1    Date for PT Re-Evaluation 09/20/20    Authorization Type [employer]    PT Start Time 0830    PT Stop Time 0915    PT Time Calculation (min) 45 min    Activity Tolerance Patient tolerated treatment well    Behavior During Therapy [facility] for tasks assessed/performed           Past Medical History:  Diagnosis Date  . Asthma   . History of asthma    no current med.  Marland Kitchen 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  . History of kidney stones   . Menstrual periods irregular     Past Surgical History:  Procedure Laterality Date  . CESAREAN SECTION     x 2  . CHOLECYSTECTOMY N/A 02/05/2020   Procedure: LAPAROSCOPIC CHOLECYSTECTOMY WITH POSSIBLE INTRAOPERATIVE CHOLANGIOGRAM;  Surgeon: Michael Boston, MD;  Location: [facility];  Service: General;  Laterality: N/A;  . LAPAROSCOPIC TUBAL LIGATION Bilateral 05/08/2017   Procedure: LAPAROSCOPIC TUBAL LIGATION - FILSHIE CLIPS;  Surgeon: Emily Filbert, MD;  Location: [facility];  Service: Gynecology;  Laterality: Bilateral;  . LOWER EXTREMITY ANGIOGRAM Left 07/11/2016   Procedure: Percutaneous Venous Thrombectomy with IVUS and  with Stent Placement;  Surgeon: Serafina Mitchell, MD;  Location: [facility];  Service: Vascular;  Laterality: Left;    There were no vitals filed for this visit.    Subjective Assessment - 07/19/20 0832    Subjective Patient reports the urinary leakage for 3 years. Has to wear a panty liner.    Patient Stated Goals reduce leakage    Currently in Pain? No/denies    Multiple Pain Sites No               [facility] PT Assessment - 07/19/20 0001      Assessment   Medical Diagnosis N39.3 Stress incontinence in female    Referring Provider (PT) Fatima Blank CNM    Onset Date/Surgical Date --   3 years ago   Prior Therapy None      Precautions   Precautions None      Restrictions   Weight Bearing Restrictions No      Balance Screen   Has the patient fallen in the past 6 months No    Has the patient had a decrease in activity level because of a fear of falling?  No    Is the patient reluctant to leave their home because of a fear of falling?  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      Cognition   Overall Cognitive Status Within Functional Limits for tasks assessed      Posture/Postural Control   Posture/Postural Control No significant limitations      ROM / Strength   AROM / PROM / Strength AROM;PROM;Strength      AROM   Lumbar Flexion decreased by 25% with tightness in lumbar  Strength   Overall Strength Comments abdominal strength 2/5, difficult to contract    Right Hip ABduction 4/5    Left Hip ABduction 4/5      Palpation   Spinal mobility decreased movement of the T10-L2 with decreased rib cage movement    Palpation comment decreased movement of lwoer rib cage;                       Objective measurements completed on examination: See above findings.     Pelvic Floor Special Questions - 07/19/20 0001    Prior Pregnancies Yes    Number of C-Sections 2    Currently Sexually Active Yes    Is this Painful No    Urinary Leakage Yes    Activities that cause leaking Other;Sneezing;Laughing;Coughing    Other activities that cause leaking jumping    Urinary urgency --   sometimes   Fecal incontinence No    Falling out feeling (prolapse) No    Skin Integrity Intact   dryness   Pelvic  Floor Internal Exam Patient confirms identification and approves PT to assess pelvic floor and treatment    Exam Type Vaginal    Palpation tightness in the intoritus    Strength weak squeeze, no lift   left side is 1/5   Strength # of seconds 1   holds breath           [facility] Adult PT Treatment/Exercise - 07/19/20 0001      Self-Care   Self-Care Other Self-Care Comments    Other Self-Care Comments  educated patient on vaginal lubricants and moisturizers and what to look for to not irritate the area; educated patient on bladder irritants that could contribute to urinary leakage      Neuro Re-ed    Neuro Re-ed Details  diaphragmatic breathing with opening the lower rib cage and then placing air into the abdoemn                  PT Education - 07/19/20 0911    Education Details Access Code: [email or web address]; educated patient on lubricants, vaginal moisturizer, and diet irritants    Person(s) Educated Patient    Methods Explanation;Demonstration;Handout    Comprehension Verbalized understanding;Returned demonstration            PT Short Term Goals - 07/19/20 0920      PT SHORT TERM GOAL #1   Title independent with initial HEP    Baseline not educated yet    Time 4    Period Weeks    Status New    Target Date 08/16/20      PT SHORT TERM GOAL #2   Title understand what bladder irritants are and how they affect the bladder    Baseline not educated yet    Time 4    Period Weeks    Status New    Target Date 08/16/20      PT SHORT TERM GOAL #3   Title understand appropriate vaginal lubricants and moiturizers are for vaginal health    Baseline not educated yet    Time 4    Period Weeks    Status New    Target Date 08/16/20             PT Long Term Goals - 07/19/20 0922      PT LONG TERM GOAL #1   Title indeepndent with advanced HEP    Baseline not educated yet    Time  9    Period Weeks    Status New    Target Date 09/20/20      PT LONG TERM GOAL #2   Title  urinary leakage decreased >/= 75% with laughing, sneezing, and coughing due to pelvic floor strength >/= 3/5 holding for 10 seconds    Baseline pelvic floor strength on right is 2/5 and left is 1/5 holding for 1 second    Time 9    Period Weeks    Status New    Target Date 09/20/20      PT LONG TERM GOAL #3   Title able to bulge the pelvic floor to elongate and relax the muscles to improve tissue mobitliy    Baseline not able to bulge the pelvic floor    Time 9    Period Weeks    Status New    Target Date 09/20/20                  Plan - 07/19/20 0913    Clinical Impression Statement Patient is a 34 year old female with stress incontinence for several years. She reports no pelvic pain. Patient has had 2 c-sections. She will leak urine with laughing, coughing, sneezing and jumping. Patient wears 1 panty liner per day. Her pelvic floor strength on the right is 2/5 and left is 1/5. She has tightness in the pelvic muscles and not able to elongate them with breath. Her lower rib cage has decreased movement. She has decreased movement of T10-L2. Abdominal strength is 2/5. Bilateral hip abduction is 4/5. When patient contracts the pelvic floor she will hold her breath. Patient will benefit from skilled therapy to assist in elongation of the pelvic floor followed by strengthening to reduce her leakage.    Personal Factors and Comorbidities Comorbidity 2    Comorbidities C-section x2; Laproscocpic tubal ligation 05/08/17    Examination-Activity Limitations Continence;Other   laughing, sneezing, coughing   Stability/Clinical Decision Making Stable/Uncomplicated    Clinical Decision Making Low    Rehab Potential Excellent    PT Frequency 1x / week    PT Duration Other (comment)   9 weeks   PT Treatment/Interventions ADLs/Self Care Home Management;Biofeedback;Electrical Stimulation;Therapeutic activities;Therapeutic exercise;Neuromuscular re-education;Patient/family education;Manual  techniques;Dry needling;Spinal Manipulations    PT Next Visit Plan work on rib cage movement, joint mobilization to T10-L2; softt tissue work; lower abdominal contraction; manual work on pelvic floor to assist in elongation    PT Home Exercise Plan Access Code: [email or web address]    Consulted and Agree with Plan of Care Patient           Patient will benefit from skilled therapeutic intervention in order to improve the following deficits and impairments:  Decreased endurance, Decreased activity tolerance, Decreased strength, Increased fascial restricitons, Increased muscle spasms  Visit Diagnosis: Muscle weakness (generalized) - Plan: PT plan of care cert/re-cert  Other lack of coordination - Plan: PT plan of care cert/re-cert  Stress incontinence (female) (female) - Plan: PT plan of care cert/re-cert     Problem List Patient Active Problem List   Diagnosis Date Noted  . Acute gallstone pancreatitis 02/02/2020  . 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 DVT (deep venous thrombosis) ([place]) 07/11/2016  . DVT of lower extremity (deep venous thrombosis) ([place]) 01/24/2016  . High risk sexual behavior 08/04/2015    Earlie Counts, PT 07/19/20 9:27 AM   [facility] [address] [place], [place], [address]  Phone: [phone]   Fax:  [phone]  Name: Paula Warner MRN: [id number] Date of Birth: 1986/09/04 PHYSICAL THERAPY DISCHARGE SUMMARY  Visits from Start of Care: 1  Current functional level related to goals / functional outcomes: See above. Patient no-showed for the past 2 visits. She was aware of the attendance policy.   Remaining deficits: See above.    Education / Equipment: HEP Plan:                                                    Patient goals were not met. Patient is being discharged due to not returning since the last visit. Thank you for the referral. Earlie Counts, PT 08/09/20  3:37 PM   ?????

## 2020-07-26 ENCOUNTER — Telehealth: Payer: Self-pay | Admitting: Physical Therapy

## 2020-07-26 ENCOUNTER — Encounter: Payer: 59 | Attending: Advanced Practice Midwife | Admitting: Physical Therapy

## 2020-07-26 DIAGNOSIS — N393 Stress incontinence (female) (male): Secondary | ICD-10-CM | POA: Insufficient documentation

## 2020-07-26 DIAGNOSIS — R278 Other lack of coordination: Secondary | ICD-10-CM | POA: Insufficient documentation

## 2020-07-26 DIAGNOSIS — M6281 Muscle weakness (generalized): Secondary | ICD-10-CM | POA: Insufficient documentation

## 2020-07-26 NOTE — Telephone Encounter (Signed)
Called patient about her missed appointment today at 8:30. Left a message.  Earlie Counts, PT @11 /10/2019@ 10:18 AM

## 2020-07-29 ENCOUNTER — Other Ambulatory Visit: Payer: Self-pay

## 2020-07-29 ENCOUNTER — Encounter (HOSPITAL_COMMUNITY): Payer: Self-pay

## 2020-07-29 ENCOUNTER — Emergency Department (HOSPITAL_BASED_OUTPATIENT_CLINIC_OR_DEPARTMENT_OTHER): Payer: 59

## 2020-07-29 ENCOUNTER — Emergency Department (HOSPITAL_COMMUNITY)
Admission: EM | Admit: 2020-07-29 | Discharge: 2020-07-29 | Disposition: A | Discharge status: Home / Self Care | Payer: 59 | Attending: Emergency Medicine | Admitting: Emergency Medicine

## 2020-07-29 DIAGNOSIS — M791 Myalgia, unspecified site: Secondary | ICD-10-CM | POA: Diagnosis not present

## 2020-07-29 DIAGNOSIS — M79605 Pain in left leg: Secondary | ICD-10-CM

## 2020-07-29 DIAGNOSIS — M79609 Pain in unspecified limb: Secondary | ICD-10-CM

## 2020-07-29 DIAGNOSIS — J45909 Unspecified asthma, uncomplicated: Secondary | ICD-10-CM | POA: Insufficient documentation

## 2020-07-29 DIAGNOSIS — I83812 Varicose veins of left lower extremities with pain: Secondary | ICD-10-CM | POA: Diagnosis not present

## 2020-07-29 DIAGNOSIS — I8392 Asymptomatic varicose veins of left lower extremity: Secondary | ICD-10-CM

## 2020-07-29 DIAGNOSIS — M79662 Pain in left lower leg: Secondary | ICD-10-CM | POA: Diagnosis present

## 2020-07-29 NOTE — ED Triage Notes (Signed)
Pt presents with c/o left leg pain. Pt denies any injury. Pt reports hx of blood clot in the same leg. Pt reports the pain is in her left calf area and up behind her knee. Pt ambulatory to triage. Pt denies any shortness of breath or chest pain at this time.

## 2020-07-29 NOTE — Progress Notes (Signed)
Lower extremity venous LT study completed.  Preliminary results relayed to [place], [place].   See CV Proc for preliminary results report.   Darlin Coco, RDMS

## 2020-07-29 NOTE — ED Provider Notes (Signed)
[facility] DEPT Provider Note   CSN: [id number] Arrival date & time: 07/29/20  0847     History Chief Complaint  Patient presents with  . Leg Pain    Paula Warner is a 34 y.o. female.  34 year old female presents with complaint of left calf pain.  Patient reports history of DVT about 5 years ago, states she has a filter, not anticoagulated.  Patient denies any injury to the leg, swelling, redness.  Known varicose veins and wears compression hose.  No other complaints or concerns.        Past Medical History:  Diagnosis Date  . Asthma   . History of asthma    no current med.  Marland Kitchen 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  . History of kidney stones   . Menstrual periods irregular     Patient Active Problem List   Diagnosis Date Noted  . Acute gallstone pancreatitis 02/02/2020  . 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 DVT (deep venous thrombosis) ([place]) 07/11/2016  . DVT of lower extremity (deep venous thrombosis) ([place]) 01/24/2016  . High risk sexual behavior 08/04/2015    Past Surgical History:  Procedure Laterality Date  . CESAREAN SECTION     x 2  . CHOLECYSTECTOMY N/A 02/05/2020   Procedure: LAPAROSCOPIC CHOLECYSTECTOMY WITH POSSIBLE INTRAOPERATIVE CHOLANGIOGRAM;  Surgeon: Michael Boston, MD;  [facility];  Service: General;  Laterality: N/A;  . LAPAROSCOPIC TUBAL LIGATION Bilateral 05/08/2017   Procedure: LAPAROSCOPIC TUBAL LIGATION - FILSHIE CLIPS;  Surgeon: Emily Filbert, MD;  Location: [facility];  Service: Gynecology;  Laterality: Bilateral;  . LOWER EXTREMITY ANGIOGRAM Left 07/11/2016   Procedure: Percutaneous Venous Thrombectomy with IVUS and  with Stent Placement;  Surgeon: Serafina Mitchell, MD;  Location: [facility];  Service: Vascular;  Laterality: Left;     OB History    Gravida  3   Para  1   Term      Preterm      AB  1   Living  2     SAB      TAB  1    Ectopic      Multiple  0   Live Births  2           Family History  Problem Relation Age of Onset  . Heart murmur Mother   . Asthma Mother   . Clotting disorder Father   . Heart disease Father     Social History   Tobacco Use  . Smoking status: Never Smoker  . Smokeless tobacco: Never Used  Vaping Use  . Vaping Use: Never used  Substance Use Topics  . Alcohol use: No    Alcohol/week: 0.0 standard drinks  . Drug use: No    Home Medications Prior to Admission medications   Medication Sig Start Date End Date Taking? Authorizing Provider  acetaminophen (TYLENOL) 325 MG tablet Take 2 tablets (650 mg total) by mouth every 6 (six) hours as needed (pain). Patient not taking: Reported on 04/11/2020 02/06/20   Florencia Reasons, MD  bismuth subsalicylate (PEPTO BISMOL) 262 MG/15ML suspension Take 30 mLs by mouth every 6 (six) hours as needed for indigestion or diarrhea or loose stools. Patient not taking: Reported on 04/11/2020    [provider]  ibuprofen (ADVIL) 200 MG tablet Take 400 mg by mouth every 6 (six) hours as needed for headache or moderate pain. Patient not taking: Reported on 04/11/2020  [provider]  ketoconazole (NIZORAL) 2 % cream Apply 1 application topically daily. 06/28/20   Trula Slade, DPM  medroxyPROGESTERone (PROVERA) 10 MG tablet Take 1 tablet (10 mg total) by mouth daily. 04/11/20   Leftwich-Kirby, Kathie Dike, CNM  methocarbamol (ROBAXIN) 500 MG tablet Take 1 tablet (500 mg total) by mouth at bedtime as needed for muscle spasms. Patient not taking: Reported on 04/11/2020 01/09/20   Caccavale, Sophia, PA-C  predniSONE (DELTASONE) 20 MG tablet Take 20 mg by mouth daily. 05/25/20   [provider]  terbinafine (LAMISIL) 250 MG tablet Take 1 tablet (250 mg total) by mouth daily. 06/28/20   Trula Slade, DPM    Allergies    Zinacef [cefuroxime] and Pollen extract  Review of Systems   Review of Systems  Constitutional: Negative for  fever.  Musculoskeletal: Positive for myalgias. Negative for arthralgias and gait problem.  Skin: Negative for rash and wound.  Neurological: Negative for weakness and numbness.  All other systems reviewed and are negative.   Physical Exam Updated Vital Signs BP 119/65   Pulse 66   Temp 98.2 F (36.8 C) (Oral)   Resp 18   Ht 5\' 1"  (1.549 m)   Wt 70.8 kg   LMP 05/30/2020 (Approximate)   SpO2 98%   BMI 29.48 kg/m   Physical Exam Vitals and nursing note reviewed.  Constitutional:      General: She is not in acute distress.    Appearance: She is well-developed. She is not diaphoretic.  HENT:     Head: Normocephalic and atraumatic.  Cardiovascular:     Pulses: Normal pulses.  Pulmonary:     Effort: Pulmonary effort is normal.  Musculoskeletal:        General: Tenderness present. No swelling or deformity.     Comments: Varicose veins to left lower leg, mild tenderness, no erythema, no swelling.  Skin:    General: Skin is warm and dry.     Findings: No bruising, erythema or rash.  Neurological:     Mental Status: She is alert and oriented to person, place, and time.  Psychiatric:        Behavior: Behavior normal.     ED Results / Procedures / Treatments   Labs (all labs ordered are listed, but only abnormal results are displayed) Labs Reviewed - No data to display  EKG None  Radiology VAS [place] LOWER EXTREMITY VENOUS (DVT) ([facility] 7a-7p)  Result Date: 07/29/2020  Lower Venous DVT Study Indications: Pain in LT leg- popliteal and calf. Other Indications: History of DVT LT, May-Thurner syndrome. Limitations: Depth and size of vesels. Comparison Study: 08-09-2017 Prior LT lower venous was WNL. Performing Technologist: Darlin Coco, RDMS  Examination Guidelines: A complete evaluation includes B-mode imaging, spectral Doppler, color Doppler, and power Doppler as needed of all accessible portions of each vessel. Bilateral testing is considered an integral part of a complete  examination. Limited examinations for reoccurring indications may be performed as noted. The reflux portion of the exam is performed with the patient in reverse Trendelenburg.  +-----+---------------+---------+-----------+----------+--------------+ RIGHTCompressibilityPhasicitySpontaneityPropertiesThrombus Aging +-----+---------------+---------+-----------+----------+--------------+ CFV  Full           Yes      Yes                                 +-----+---------------+---------+-----------+----------+--------------+   +---------+---------------+---------+-----------+----------+-------------------+ LEFT     CompressibilityPhasicitySpontaneityPropertiesThrombus Aging      +---------+---------------+---------+-----------+----------+-------------------+  CFV      Full           Yes      Yes                                      +---------+---------------+---------+-----------+----------+-------------------+ SFJ      Full                                                             +---------+---------------+---------+-----------+----------+-------------------+ FV Prox  Full                                                             +---------+---------------+---------+-----------+----------+-------------------+ FV Mid   Full                                                             +---------+---------------+---------+-----------+----------+-------------------+ FV DistalFull                                                             +---------+---------------+---------+-----------+----------+-------------------+ PFV      Full                                                             +---------+---------------+---------+-----------+----------+-------------------+ POP      Full           Yes      Yes                                      +---------+---------------+---------+-----------+----------+-------------------+ PTV      Full                                                              +---------+---------------+---------+-----------+----------+-------------------+ PERO     Full                                         Some segments not  well visualized     +---------+---------------+---------+-----------+----------+-------------------+ Gastroc  Full                                                             +---------+---------------+---------+-----------+----------+-------------------+     Summary: RIGHT: - No evidence of common femoral vein obstruction.  LEFT: - There is no evidence of deep vein thrombosis in the lower extremity. However, portions of this examination were limited- see technologist comments above.  - No cystic structure found in the popliteal fossa.  *See table(s) above for measurements and observations.    Preliminary     Procedures Procedures (including critical care time)  Medications Ordered in ED Medications - No data to display  ED Course  I have reviewed the triage vital signs and the nursing notes.  Pertinent labs & imaging results that were available during my care of the patient were reviewed by me and considered in my medical decision making (see chart for details).  Clinical Course as of Jul 29 1200  Fri Jul 29, 5014  [address] 34 year old female with history of DVT presents with complaint of left calf pain with concern for DVT. On exam, found to have varicose veins, no swelling. Doppler exam is negative for DVT. Recommend patient continue to wear her compression hose, Motrin Tylenol for pain and will give referral for follow-up with vascular.   [LM]    Clinical Course User Index [LM] Roque Lias   MDM Rules/Calculators/A&P                          Final Clinical Impression(s) / ED Diagnoses Final diagnoses:  Left leg pain  Varicose veins of left lower extremity, unspecified whether complicated    Rx / DC  Orders ED Discharge Orders    None       Tacy Learn, PA-C 07/29/20 1201    Wyvonnia Dusky, MD 07/29/20 1427

## 2020-08-02 ENCOUNTER — Encounter: Payer: 59 | Admitting: Physical Therapy

## 2020-08-02 ENCOUNTER — Telehealth: Payer: Self-pay | Admitting: Physical Therapy

## 2020-08-02 NOTE — Telephone Encounter (Signed)
Spoke to patient about her no-show to todays appointment at 8:30. She reported that she could not find the appointment on my chart and did not receive a reminder phone call. Patient was made aware of the attendance policy. She is aware that if she no-shows to her next appointment she will be discharged.  Earlie Counts, PT @11 /05/2020@ 8:50 AM

## 2020-08-09 ENCOUNTER — Telehealth: Payer: Self-pay | Admitting: Physical Therapy

## 2020-08-09 ENCOUNTER — Encounter: Payer: Self-pay | Admitting: Physical Therapy

## 2020-08-09 NOTE — Telephone Encounter (Signed)
Called patient about her missed visit today at 1500. Left a message.  Earlie Counts, PT @11 /16/2021@ 3:34 PM

## 2020-08-12 ENCOUNTER — Emergency Department (HOSPITAL_COMMUNITY)
Admission: EM | Admit: 2020-08-12 | Discharge: 2020-08-12 | Disposition: A | Discharge status: Home / Self Care | Payer: 59 | Attending: Emergency Medicine | Admitting: Emergency Medicine

## 2020-08-12 ENCOUNTER — Encounter (HOSPITAL_COMMUNITY): Payer: Self-pay | Admitting: Emergency Medicine

## 2020-08-12 DIAGNOSIS — J45909 Unspecified asthma, uncomplicated: Secondary | ICD-10-CM | POA: Insufficient documentation

## 2020-08-12 DIAGNOSIS — M79605 Pain in left leg: Secondary | ICD-10-CM | POA: Diagnosis not present

## 2020-08-12 DIAGNOSIS — M79604 Pain in right leg: Secondary | ICD-10-CM | POA: Diagnosis present

## 2020-08-12 MED ORDER — NAPROXEN 375 MG PO TABS: 375.0000 mg | ORAL_TABLET | Freq: Two times a day (BID) | ORAL | 0 refills | Status: DC

## 2020-08-12 NOTE — Discharge Instructions (Addendum)
Antiinflammatory medications: Take 600 mg of ibuprofen every 6 hours or 375 mg of naproxen every 12 hours for the next 3 days. After this time, these medications may be used as needed for pain. Take these medications with food to avoid upset stomach. Choose only one of these medications, do not take them together. Acetaminophen (generic for Tylenol): Should you continue to have additional pain while taking the ibuprofen or naproxen, you may add in acetaminophen as needed. Your daily total maximum amount of acetaminophen from all sources should be limited to 4000mg /day for persons without liver problems, or 2000mg /day for those with liver problems.  Follow-up with a primary care provider on this matter. Also plan to keep your appointment with vascular surgery.

## 2020-08-12 NOTE — ED Triage Notes (Signed)
Pt reports lower left leg pain as well as burning to bilateral thighs since last week. Hx of dvt. Seen at [facility] last week and states she was told she had no DVT. Not currently on blood thinners.

## 2020-08-12 NOTE — ED Notes (Signed)
Patient reports pain intermittently in inner thighs which is a burning sensation. Also, pain in left leg in the front off and on as well for two weeks.

## 2020-08-12 NOTE — ED Provider Notes (Signed)
[facility] EMERGENCY DEPARTMENT Provider Note   CSN: [id number] Arrival date & time: 08/12/20  0845     History Chief Complaint  Patient presents with  . Leg Pain    TEARAH Warner is a 34 y.o. female.  HPI      Paula Warner is a 34 y.o. female, with a history of asthma, DVT, presenting to the ED with leg pain for about the last 2 weeks.  She endorses burning pain in the left anterior shin and the bilateral inner thighs intermittently. 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 Denies fever/chills, numbness, weakness, chest pain, shortness of breath, swelling, color change, abdominal pain, acute back pain, changes in bowel or bladder function, saddle anesthesias, or any other complaints.    Past Medical History:  Diagnosis Date  . Asthma   . History of asthma    no current med.  Marland Kitchen 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  . History of kidney stones   . Menstrual periods irregular     Patient Active Problem List   Diagnosis Date Noted  . Acute gallstone pancreatitis 02/02/2020  . 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 DVT (deep venous thrombosis) ([place]) 07/11/2016  . DVT of lower extremity (deep venous thrombosis) ([place]) 01/24/2016  . High risk sexual behavior 08/04/2015    Past Surgical History:  Procedure Laterality Date  . CESAREAN SECTION     x 2  . CHOLECYSTECTOMY N/A 02/05/2020   Procedure: LAPAROSCOPIC CHOLECYSTECTOMY WITH POSSIBLE INTRAOPERATIVE CHOLANGIOGRAM;  Surgeon: Michael Boston, MD;  Location: [facility];  Service: General;  Laterality: N/A;  . LAPAROSCOPIC TUBAL LIGATION Bilateral 05/08/2017   Procedure: LAPAROSCOPIC TUBAL LIGATION - FILSHIE CLIPS;  Surgeon: Emily Filbert, MD;  Location: [facility];  Service: Gynecology;  Laterality: Bilateral;  . LOWER EXTREMITY ANGIOGRAM Left 07/11/2016   Procedure: Percutaneous Venous  Thrombectomy with IVUS and  with Stent Placement;  Surgeon: Serafina Mitchell, MD;  Location: [facility];  Service: Vascular;  Laterality: Left;     OB History    Gravida  3   Para  1   Term      Preterm      AB  1   Living  2     SAB      TAB  1   Ectopic      Multiple  0   Live Births  2           Family History  Problem Relation Age of Onset  . Heart murmur Mother   . Asthma Mother   . Clotting disorder Father   . Heart disease Father     Social History   Tobacco Use  . Smoking status: Never Smoker  . Smokeless tobacco: Never Used  Vaping Use  . Vaping Use: Never used  Substance Use Topics  . Alcohol use: No    Alcohol/week: 0.0 standard drinks  . Drug use: No    Home Medications Prior to Admission medications   Medication Sig Start Date End Date Taking? Authorizing Provider  acetaminophen (TYLENOL) 325 MG tablet Take 2 tablets (650 mg total) by mouth every 6 (six) hours as needed (pain). Patient not taking: Reported on 04/11/2020 02/06/20   Florencia Reasons, MD  bismuth subsalicylate (PEPTO BISMOL) 262 MG/15ML suspension Take 30 mLs by mouth every 6 (six) hours as needed for indigestion or  diarrhea or loose stools. Patient not taking: Reported on 04/11/2020    [provider]  ibuprofen (ADVIL) 200 MG tablet Take 400 mg by mouth every 6 (six) hours as needed for headache or moderate pain. Patient not taking: Reported on 04/11/2020    [provider]  ketoconazole (NIZORAL) 2 % cream Apply 1 application topically daily. 06/28/20   Trula Slade, DPM  medroxyPROGESTERone (PROVERA) 10 MG tablet Take 1 tablet (10 mg total) by mouth daily. 04/11/20   Leftwich-Kirby, Kathie Dike, CNM  methocarbamol (ROBAXIN) 500 MG tablet Take 1 tablet (500 mg total) by mouth at bedtime as needed for muscle spasms. Patient not taking: Reported on 04/11/2020 01/09/20   Caccavale, Sophia, PA-C  naproxen (NAPROSYN) 375 MG tablet Take 1 tablet (375 mg total) by mouth 2 (two)  times daily with a meal. 08/12/20   Elijahjames Fuelling C, PA-C  predniSONE (DELTASONE) 20 MG tablet Take 20 mg by mouth daily. 05/25/20   [provider]  terbinafine (LAMISIL) 250 MG tablet Take 1 tablet (250 mg total) by mouth daily. 06/28/20   Trula Slade, DPM    Allergies    Zinacef [cefuroxime] and Pollen extract  Review of Systems   Review of Systems  Constitutional: Negative for fever.  Respiratory: Negative for shortness of breath.   Cardiovascular: Negative for chest pain.  Gastrointestinal: Negative for abdominal pain.  Genitourinary: Negative for difficulty urinating.  Musculoskeletal: Negative for joint swelling.       Leg pain  Neurological: Negative for weakness and numbness.    Physical Exam Updated Vital Signs BP 117/71   Pulse 72   Temp 98.4 F (36.9 C) (Oral)   Resp 16   Ht 5\' 1"  (1.549 m)   Wt 68 kg   SpO2 97%   BMI 28.34 kg/m   Physical Exam Vitals and nursing note reviewed.  Constitutional:      General: She is not in acute distress.    Appearance: She is well-developed. She is not diaphoretic.  HENT:     Head: Normocephalic and atraumatic.  Eyes:     Conjunctiva/sclera: Conjunctivae normal.  Cardiovascular:     Rate and Rhythm: Normal rate and regular rhythm.  Pulmonary:     Effort: Pulmonary effort is normal.  Abdominal:     Palpations: Abdomen is soft.     Tenderness: There is no abdominal tenderness.  Musculoskeletal:        General: No swelling or tenderness.     Cervical back: Neck supple.     Right lower leg: No edema.     Left lower leg: No edema.     Comments: The bilateral lower extremities are without tenderness, swelling, color change, temperature abnormality. There are venous varicosities noted especially in the thighs. She has no tenderness to the hips or lower back.  Full range of motion in the joints of the lower extremities.  Skin:    General: Skin is warm and dry.     Coloration: Skin is not pale.  Neurological:       Mental Status: She is alert.     Comments: Sensation grossly intact to light touch in the lower extremities bilaterally. No saddle anesthesias. Strength 5/5 in the bilateral lower extremities. No noted gait deficit. Coordination intact.  Psychiatric:        Behavior: Behavior normal.     ED Results / Procedures / Treatments   Labs (all labs ordered are listed, but only abnormal results are displayed) Labs  Reviewed - No data to display  EKG None  Radiology No results found.  Procedures Procedures (including critical care time)  Medications Ordered in ED Medications - No data to display  ED Course  I have reviewed the triage vital signs and the nursing notes.  Pertinent labs & imaging results that were available during my care of the patient were reviewed by me and considered in my medical decision making (see chart for details).    MDM Rules/Calculators/A&P                          Patient presents with intermittent burning pain to the bilateral inner thighs in the left anterior shin for the past couple weeks. I reviewed patient's chart for more information.  She was seen at [facility] on November 5.  Vascular ultrasound performed at that time was free from any acute abnormalities. Patient has an appointment scheduled with her vascular specialist on December 6. The patient was given instructions for home care as well as return precautions. Patient voices understanding of these instructions, accepts the plan, and is comfortable with discharge.  Findings and plan of care discussed with EDP, Dr. Roderic Palau.   Final Clinical Impression(s) / ED Diagnoses Final diagnoses:  Bilateral leg pain    Rx / DC Orders ED Discharge Orders         Ordered    naproxen (NAPROSYN) 375 MG tablet  2 times daily with meals        08/12/20 0924           Lorayne Bender, PA-C 08/12/20 0934    Milton Ferguson, MD 08/15/20 [phone]

## 2020-08-12 NOTE — ED Notes (Signed)
PA Joy at bedside

## 2020-08-23 ENCOUNTER — Other Ambulatory Visit: Payer: Self-pay

## 2020-08-23 DIAGNOSIS — I8390 Asymptomatic varicose veins of unspecified lower extremity: Secondary | ICD-10-CM

## 2020-08-29 ENCOUNTER — Ambulatory Visit (INDEPENDENT_AMBULATORY_CARE_PROVIDER_SITE_OTHER): Payer: 59 | Admitting: Physician Assistant

## 2020-08-29 ENCOUNTER — Ambulatory Visit (HOSPITAL_COMMUNITY)
Admission: RE | Admit: 2020-08-29 | Discharge: 2020-08-29 | Disposition: A | Discharge status: Home / Self Care | Payer: 59 | Source: Ambulatory Visit | Attending: Surgery | Admitting: Surgery

## 2020-08-29 ENCOUNTER — Other Ambulatory Visit: Payer: Self-pay

## 2020-08-29 ENCOUNTER — Telehealth: Payer: Self-pay

## 2020-08-29 VITALS — BP 112/81 | HR 87 | Temp 98.8°F | Resp 18 | Ht 61.0 in | Wt 162.6 lb

## 2020-08-29 DIAGNOSIS — I8390 Asymptomatic varicose veins of unspecified lower extremity: Secondary | ICD-10-CM | POA: Diagnosis present

## 2020-08-29 NOTE — Progress Notes (Unsigned)
VASCULAR & VEIN SPECIALISTS           OF [place]  History and Physical   Paula Warner is a 34 y.o. female who has hx of left leg DVT and underwent left leg venogram with angiojet mechanical thrombectomy left FV, CFV, CIV, and EIV, stent of left CFV, EIV and CIV and angioplasty of left FV in October 2017 by Dr. Trula Slade.  She had occluded left iliac vein secondary to May Thurner syndrome.  She was last seen by Dr. Trula Slade in 2018.  In November 2017, this revealed occlusion of her iliac stents and she was asymptomatic and no intervention was recommended.  She had also undergone BTL and was no longer taking BCP, which was likely the source of her hypercoagulable state leading to DVT and she had stopped her [facility] on her own.  She continues to not be on [facility].    She comes in today with with BLE swelling and heaviness of legs with left worse than right.  She states that this has been going on for about a month.  She states that it is worse with standing.  She went to the ER last month and had ultrasound and this was negative for DVT.  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  She wears mild knee high compression and does try to elevate her legs.  She states her mother has history of vein problems.   She states that she also has some burning in both thighs and this can happen at any time.  She states that a hot shower helps with this.     The pt is not on a statin for cholesterol management.  The pt is not on a daily aspirin.   Other AC:  none The pt is not on medication for hypertension.   The pt is not diabetic.   Tobacco hx:  never   Past Medical History:  Diagnosis Date  . Asthma   . History of asthma    no current med.  Marland Kitchen 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  . History of kidney stones   . Menstrual periods irregular     Past Surgical History:  Procedure Laterality Date  . CESAREAN SECTION     x 2  . CHOLECYSTECTOMY N/A 02/05/2020   Procedure:  LAPAROSCOPIC CHOLECYSTECTOMY WITH POSSIBLE INTRAOPERATIVE CHOLANGIOGRAM;  Surgeon: Michael Boston, MD;  Location: [facility];  Service: General;  Laterality: N/A;  . LAPAROSCOPIC TUBAL LIGATION Bilateral 05/08/2017   Procedure: LAPAROSCOPIC TUBAL LIGATION - FILSHIE CLIPS;  Surgeon: Emily Filbert, MD;  Location: [facility];  Service: Gynecology;  Laterality: Bilateral;  . LOWER EXTREMITY ANGIOGRAM Left 07/11/2016   Procedure: Percutaneous Venous Thrombectomy with IVUS and  with Stent Placement;  Surgeon: Serafina Mitchell, MD;  Location: [facility] OR;  Service: Vascular;  Laterality: Left;    Social History   Socioeconomic History  . Marital status: Single    Spouse name: Not on file  . Number of children: Not on file  . Years of education: Not on file  . Highest education level: Not on file  Occupational History  . Not on file  Tobacco Use  . Smoking status: Never Smoker  . Smokeless tobacco: Never Used  Vaping Use  . Vaping Use: Never used  Substance and Sexual Activity  . Alcohol use: No    Alcohol/week: 0.0 standard drinks  . Drug use:  No  . Sexual activity: Yes    Partners: Male    Birth control/protection: Surgical  Other Topics Concern  . Not on file  Social History Narrative  . Not on file   Social Determinants of Health   Financial Resource Strain:   . Difficulty of Paying Living Expenses: Not on file  Food Insecurity:   . Worried About Charity fundraiser in the Last Year: Not on file  . Ran Out of Food in the Last Year: Not on file  Transportation Needs:   . Lack of Transportation (Medical): Not on file  . Lack of Transportation (Non-Medical): Not on file  Physical Activity:   . Days of Exercise per Week: Not on file  . Minutes of Exercise per Session: Not on file  Stress:   . Feeling of Stress : Not on file  Social Connections:   . Frequency of Communication with Friends and Family: Not on file  . Frequency of Social Gatherings with Friends and Family: Not  on file  . Attends Religious Services: Not on file  . Active Member of Clubs or Organizations: Not on file  . Attends Archivist Meetings: Not on file  . Marital Status: Not on file  Intimate Partner Violence:   . Fear of Current or Ex-Partner: Not on file  . Emotionally Abused: Not on file  . Physically Abused: Not on file  . Sexually Abused: Not on file     Family History  Problem Relation Age of Onset  . Heart murmur Mother   . Asthma Mother   . Clotting disorder Father   . Heart disease Father     Current Outpatient Medications  Medication Sig Dispense Refill  . acetaminophen (TYLENOL) 325 MG tablet Take 2 tablets (650 mg total) by mouth every 6 (six) hours as needed (pain). 30 tablet 0  . bismuth subsalicylate (PEPTO BISMOL) 262 MG/15ML suspension Take 30 mLs by mouth every 6 (six) hours as needed for indigestion or diarrhea or loose stools.     Marland Kitchen ibuprofen (ADVIL) 200 MG tablet Take 400 mg by mouth every 6 (six) hours as needed for headache or moderate pain.     Marland Kitchen ketoconazole (NIZORAL) 2 % cream Apply 1 application topically daily. 60 g 2  . medroxyPROGESTERone (PROVERA) 10 MG tablet Take 1 tablet (10 mg total) by mouth daily. 10 tablet 3  . methocarbamol (ROBAXIN) 500 MG tablet Take 1 tablet (500 mg total) by mouth at bedtime as needed for muscle spasms. (Patient not taking: Reported on 04/11/2020) 10 tablet 0  . naproxen (NAPROSYN) 375 MG tablet Take 1 tablet (375 mg total) by mouth 2 (two) times daily with a meal. (Patient not taking: Reported on 08/29/2020) 20 tablet 0  . predniSONE (DELTASONE) 20 MG tablet Take 20 mg by mouth daily.    Marland Kitchen terbinafine (LAMISIL) 250 MG tablet Take 1 tablet (250 mg total) by mouth daily. 14 tablet 0   No current facility-administered medications for this visit.    Allergies  Allergen Reactions  . Zinacef [Cefuroxime] Hives  . Pollen Extract     REVIEW OF SYSTEMS:   [X]  denotes positive finding, [ ]  denotes negative finding  Cardiac  Comments:  Chest pain or chest pressure:    Shortness of breath upon exertion:    Short of breath when lying flat:    Irregular heart rhythm:        Vascular    Pain in calf, thigh, or hip brought  on by ambulation:    Pain in feet at night that wakes you up from your sleep:     Blood clot in your veins:    Leg swelling:  x       Pulmonary    Oxygen at home:    Productive cough:     Wheezing:         Neurologic    Sudden weakness in arms or legs:     Sudden numbness in arms or legs:     Sudden onset of difficulty speaking or slurred speech:    Temporary loss of vision in one eye:     Problems with dizziness:         Gastrointestinal    Blood in stool:     Vomited blood:         Genitourinary    Burning when urinating:     Blood in urine:        Psychiatric    Major depression:         Hematologic    Bleeding problems:    Problems with blood clotting too easily:        Skin    Rashes or ulcers:        Constitutional    Fever or chills:      PHYSICAL EXAMINATION:  Today's Vitals   08/29/20 1437 08/29/20 1441  BP: 112/81   Pulse: 87   Resp: 18   Temp: 98.8 F (37.1 C)   TempSrc: Oral   SpO2: 95%   Weight: 162 lb 9.6 oz (73.8 kg)   Height: 5\' 1"  (1.549 m)   PainSc:  6    Body mass index is 30.72 kg/m.   General:  WDWN in NAD; vital signs documented above Gait: Not observed HENT: WNL, normocephalic Pulmonary: normal non-labored breathing without wheezing Cardiac: regular HR Abdomen: soft, NT, no masses; aortic pulse is not palpable Skin: without rashes Vascular Exam/Pulses:  Right Left  Radial 2+ (normal) 2+ (normal)  DP 2+ (normal) 2+ (normal)  PT Unable to palpate Unable to palpate   Extremities: without ischemic changes, without cellulitis; without open wounds; without skin color changes; she does have varicosities present on the left thigh and on lower leg.  Musculoskeletal: no muscle wasting or atrophy  Neurologic: A&O X 3;   moving all extremities equally Psychiatric:  The pt has Normal affect.   Non-Invasive Vascular Imaging:   Venous duplex on 08/29/2020: +----------------+---------+------+-----------+------------+---------------  ----+  LEFT      Reflux NoRefluxReflux TimeDiameter cmsComments                   Yes                      +----------------+---------+------+-----------+------------+---------------  FV prox           yes  >1 second        not completely compressible   +----------------+---------+------+-----------+------------+---------------  Popliteal    no                            +----------------+---------+------+-----------+------------+---------------  GSV at SFJ         yes  >500 ms   0.90            +----------------+---------+------+-----------+------------+---------------  GSV prox thigh no               0.46           +----------------+---------+------+-----------+------------+---------------  GSV mid thigh        yes  >500 ms   0.34           +----------------+---------+------+-----------+------------+---------------  GSV dist thigh       yes  >500 ms   0.53           +----------------+---------+------+-----------+------------+---------------  GSV at knee         yes  >500 ms   0.35           +----------------+---------+------+-----------+------------+---------------  GSV prox calf        yes  >500 ms   0.34           +----------------+---------+------+-----------+------------+---------------  SSV Pop Fossa        yes  >500 ms   0.35   thigh extension w/ no connection to popliteal     +----------------+---------+------+-----------+------------+---------------  SSV prox calf        yes   >500 ms   0.31           +----------------+---------+------+-----------+------------+---------------  anterior          yes  >500 ms   0.73           accessory                                 +----------------+---------+------+-----------+------------+---------------    Summary:  Right:  - No evidence of acute deep vein thrombosis from the common femoral through the popliteal veins.  - No evidence of superficial venous thrombosis.  - The great saphenous vein is not competent.  - The small saphenous vein is not competent.  - The anterior accessory branch is not competent.  - The deep venous system is not competent.    Paula Warner is a 34 y.o. female who presents with: bilateral leg swelling and heaviness with left worse than right with hx of DVT and left leg venogram with angiojet mechanical thrombectomy left FV, CFV, CIV, and EIV, stent of left CFV, EIV and CIV and angioplasty of left FV in October 2017 by Dr. Trula Slade.  Pt had venous duplex today that does reveal venous reflux in the left leg.  Discussed pt and her study with Dr. Trula Slade and the diameter of the vein is not amendable to laser ablation.   She does get relief with compression and elevation. -discussed with pt about wearing thigh high 20-32mmHg compression stockings (she was measured today) and elevating legs.    -discussed importance of weight loss -she was given a handout about venous disease. -pt will f/u as needed.   Leontine Locket, [facility] 08/29/2020 2:40 PM  Clinic MD:  Trula Slade

## 2020-08-29 NOTE — Telephone Encounter (Signed)
Patient present in office today for an evaluation for varicose veins. Patient was measured but requested to come back at a later time to purchase compression hose. Patient measured for thigh high compression her size is XL.

## 2020-09-15 ENCOUNTER — Encounter (HOSPITAL_COMMUNITY): Payer: Self-pay | Admitting: Emergency Medicine

## 2020-09-15 ENCOUNTER — Emergency Department (HOSPITAL_COMMUNITY)
Admission: EM | Admit: 2020-09-15 | Discharge: 2020-09-15 | Disposition: A | Discharge status: Home / Self Care | Payer: 59 | Attending: Emergency Medicine | Admitting: Emergency Medicine

## 2020-09-15 ENCOUNTER — Other Ambulatory Visit: Payer: Self-pay

## 2020-09-15 ENCOUNTER — Emergency Department (HOSPITAL_COMMUNITY): Payer: 59

## 2020-09-15 DIAGNOSIS — N201 Calculus of ureter: Secondary | ICD-10-CM | POA: Insufficient documentation

## 2020-09-15 DIAGNOSIS — J45909 Unspecified asthma, uncomplicated: Secondary | ICD-10-CM | POA: Insufficient documentation

## 2020-09-15 DIAGNOSIS — Z87442 Personal history of urinary calculi: Secondary | ICD-10-CM | POA: Diagnosis not present

## 2020-09-15 DIAGNOSIS — R1030 Lower abdominal pain, unspecified: Secondary | ICD-10-CM | POA: Diagnosis present

## 2020-09-15 LAB — CBC
HCT: 42.2 % (ref 36.0–46.0)
Hemoglobin: 14.1 g/dL (ref 12.0–15.0)
MCH: 32 pg (ref 26.0–34.0)
MCHC: 33.4 g/dL (ref 30.0–36.0)
MCV: 95.9 fL (ref 80.0–100.0)
Platelets: 259 10*3/uL (ref 150–400)
RBC: 4.4 MIL/uL (ref 3.87–5.11)
RDW: 12.2 % (ref 11.5–15.5)
WBC: 12.8 10*3/uL — ABNORMAL HIGH (ref 4.0–10.5)
nRBC: 0 % (ref 0.0–0.2)

## 2020-09-15 LAB — COMPREHENSIVE METABOLIC PANEL
ALT: 51 U/L — ABNORMAL HIGH (ref 0–44)
AST: 31 U/L (ref 15–41)
Albumin: 4.3 g/dL (ref 3.5–5.0)
Alkaline Phosphatase: 82 U/L (ref 38–126)
Anion gap: 11 (ref 5–15)
BUN: 17 mg/dL (ref 6–20)
CO2: 25 mmol/L (ref 22–32)
Calcium: 9.1 mg/dL (ref 8.9–10.3)
Chloride: 104 mmol/L (ref 98–111)
Creatinine, Ser: 0.84 mg/dL (ref 0.44–1.00)
GFR, Estimated: >60 mL/min (ref >60)
Glucose, Bld: 97 mg/dL (ref 70–99)
Potassium: 3.7 mmol/L (ref 3.5–5.1)
Sodium: 140 mmol/L (ref 135–145)
Total Bilirubin: 1.6 mg/dL — ABNORMAL HIGH (ref 0.3–1.2)
Total Protein: 8.1 g/dL (ref 6.5–8.1)

## 2020-09-15 LAB — URINALYSIS, ROUTINE W REFLEX MICROSCOPIC
Bilirubin Urine: NEGATIVE
Glucose, UA: NEGATIVE mg/dL
Ketones, ur: NEGATIVE mg/dL
Leukocytes,Ua: NEGATIVE
Nitrite: NEGATIVE
Protein, ur: 30 mg/dL — AB
RBC / HPF: >50 RBC/hpf — ABNORMAL HIGH (ref 0–5)
Specific Gravity, Urine: 1.023 (ref 1.005–1.030)
pH: 6 (ref 5.0–8.0)

## 2020-09-15 LAB — LIPASE, BLOOD: Lipase: 24 U/L (ref 11–51)

## 2020-09-15 LAB — I-STAT BETA HCG BLOOD, ED (MC, WL, AP ONLY): I-stat hCG, quantitative: <5 m[IU]/mL (ref <5)

## 2020-09-15 IMAGING — CR DG ABDOMEN 1V
1 series · 1 of 1 positions shown · non-contrast
Comparison: Renal ultrasound [DATE]

CLINICAL DATA: Right flank and lower quadrant pain

EXAM:
ABDOMEN - 1 VIEW

[x abdomen supine]
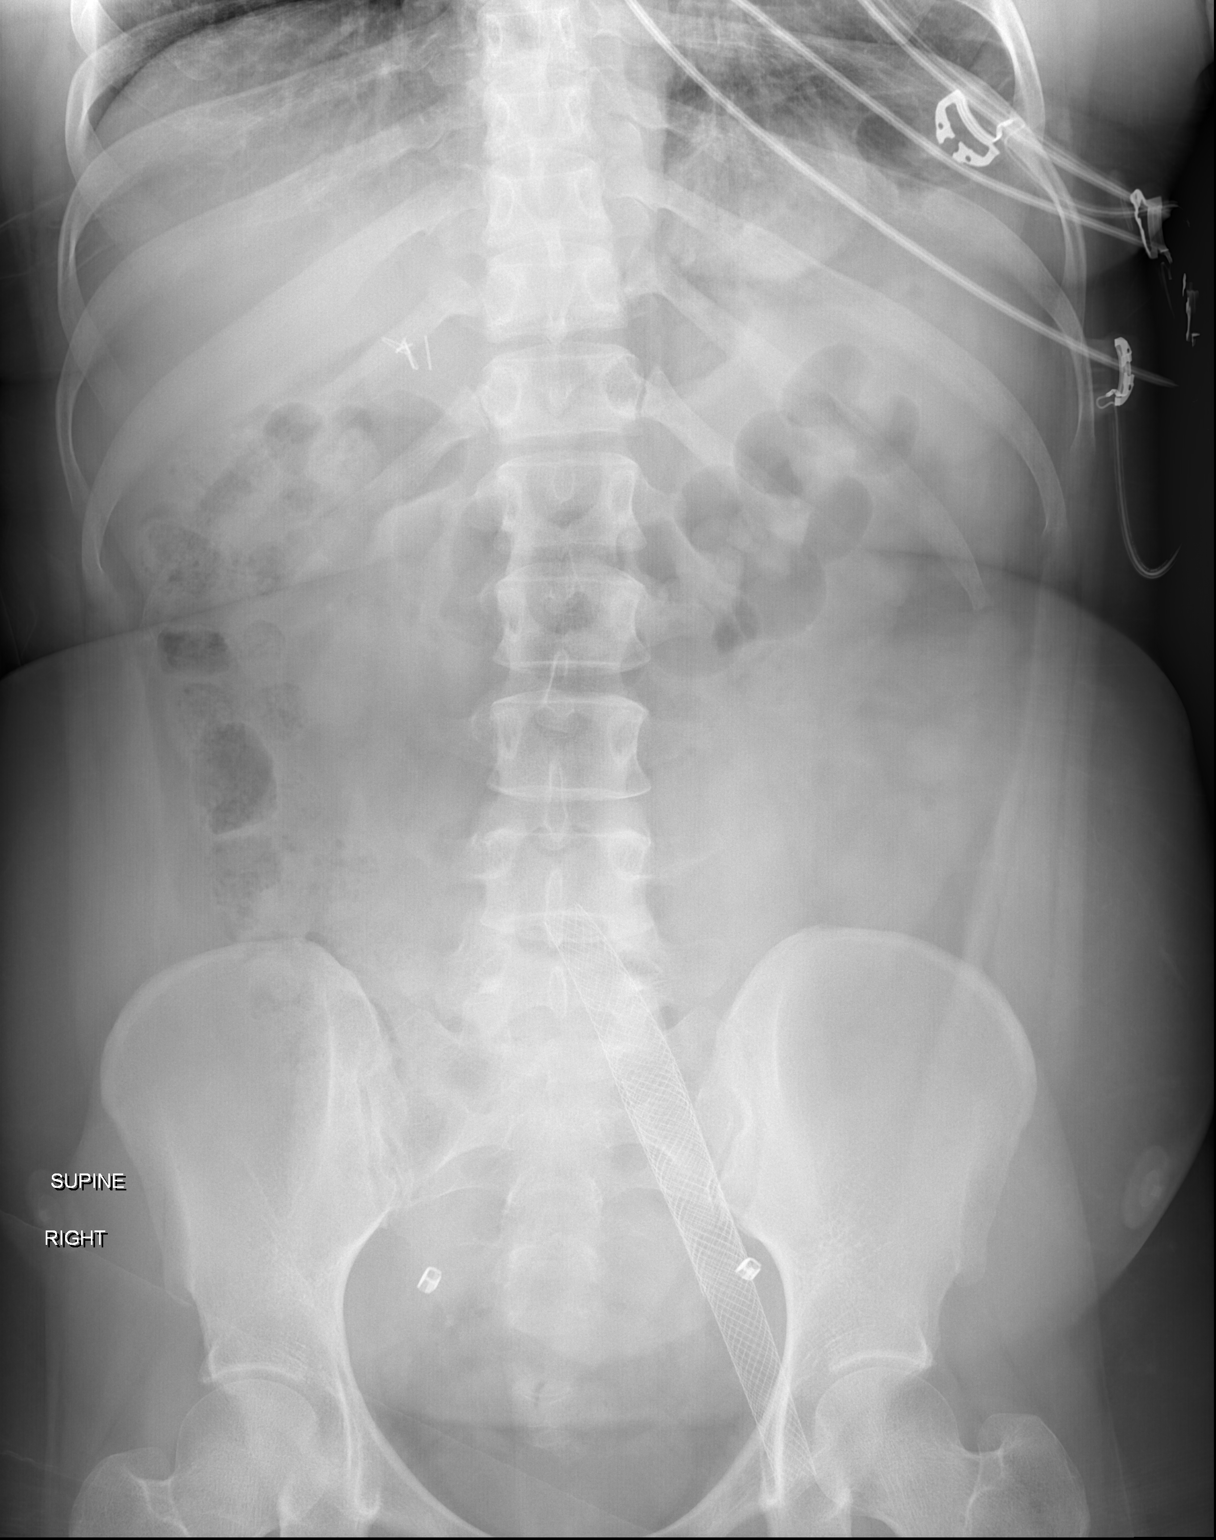

[1 of 1 positions shown; findings below may reference images not displayed]

FINDINGS: Coarse calcification projecting over the lower pole right kidney
compatible with a calculus seen on comparison ultrasound. There is
an additional coarse 4 mm calcification projecting superolateral to
the bladder shadow which could reflect a distal ureteral calculus
particularly given the presence of mild right hydronephrosis on the
comparison exam. Few radiodensities overlying the left renal shadow
are nonspecific and may be within the overlying bowel. Prior
cholecystectomy. No obstructive bowel gas pattern. Stenting, likely
venous projects over the left hemipelvis, may correlate with patient
history of DVT, possible underlying BOCKING. Bilateral tubal
ligation clips. Osseous structures are unremarkable.
IMPRESSION: 1. Right nephrolithiasis. Coarse calcification towards the right
superolateral aspect of the bladder may reflect a small distal
ureteral calculus given the presence of hydronephrosis on comparison
ultrasound.
2. Calcifications of the left renal shadow are less specific given
overlying bowel.
3. Prior cholecystectomy, tubal ligation.
4. Stenting, likely venous projects over the left hemipelvis, may
correlate with patient history of DVT.

## 2020-09-15 IMAGING — US US RENAL
1 series · 14 of 25 positions shown · non-contrast
Comparison: [DATE]

CLINICAL DATA: Right flank pain for 3 hours

EXAM:
RENAL / URINARY TRACT ULTRASOUND COMPLETE

[Series 1: us renal · 14 of 51 slices shown]
[im 1/51]
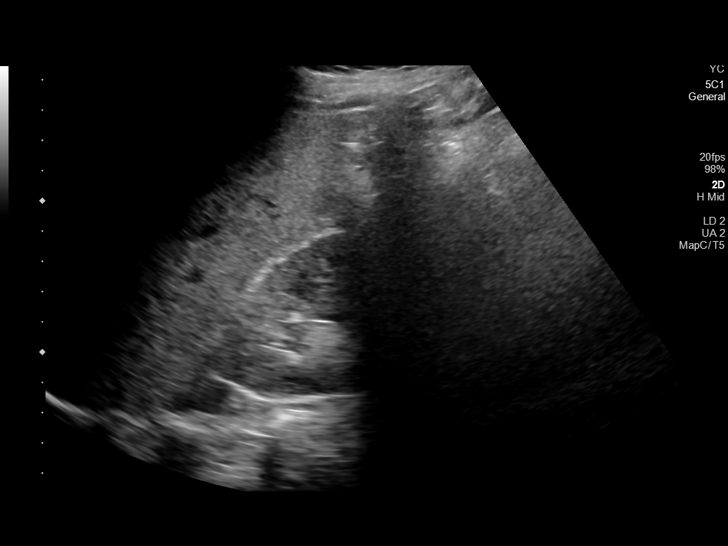
[im 5/51]
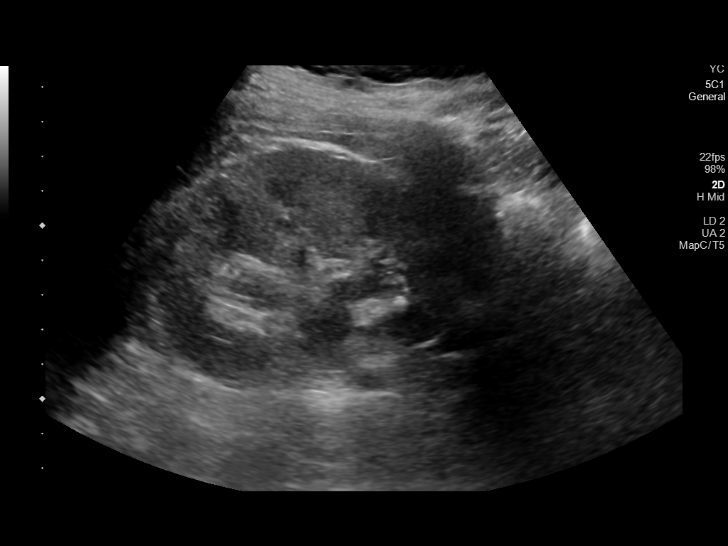
[im 9/51]
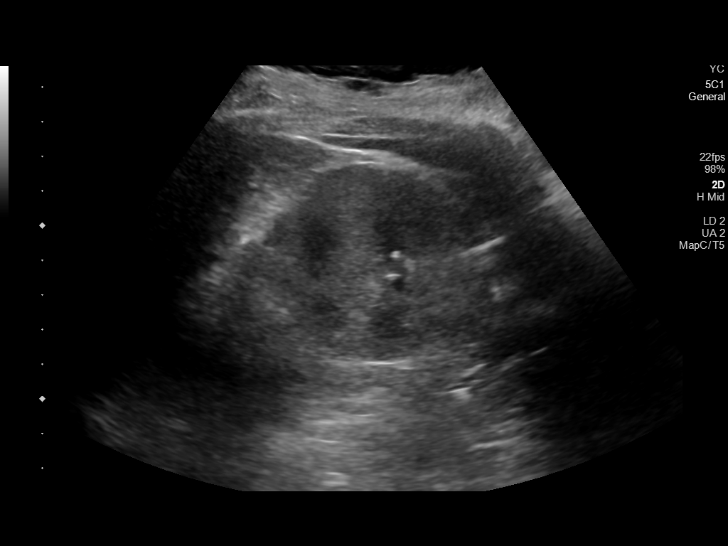
[im 13/51]
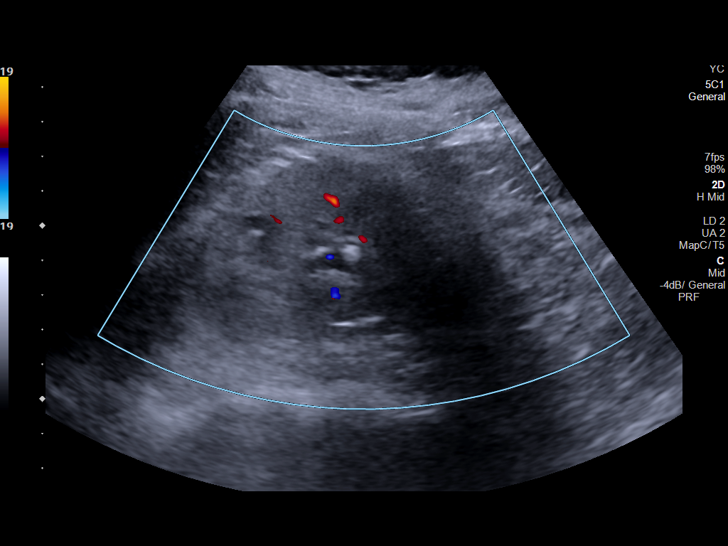
[im 17/51]
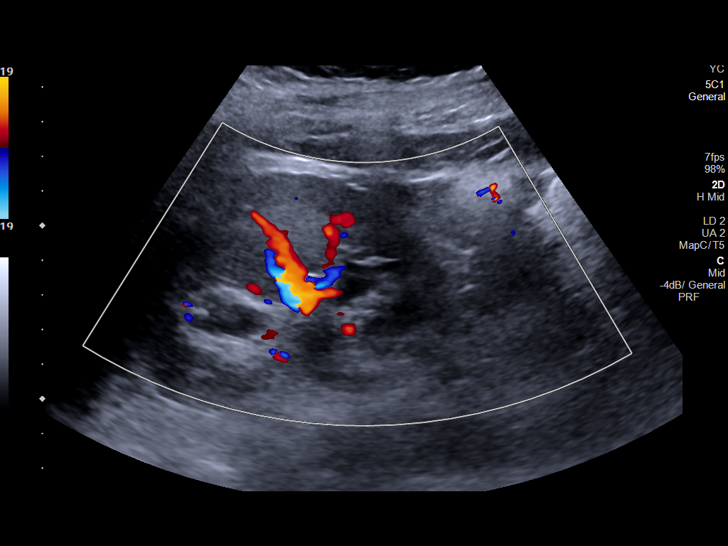
[im 19/51]
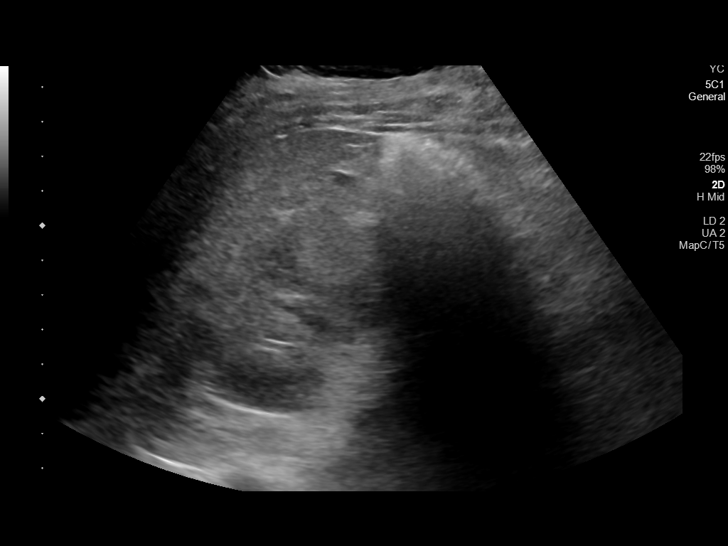
[im 23/51]
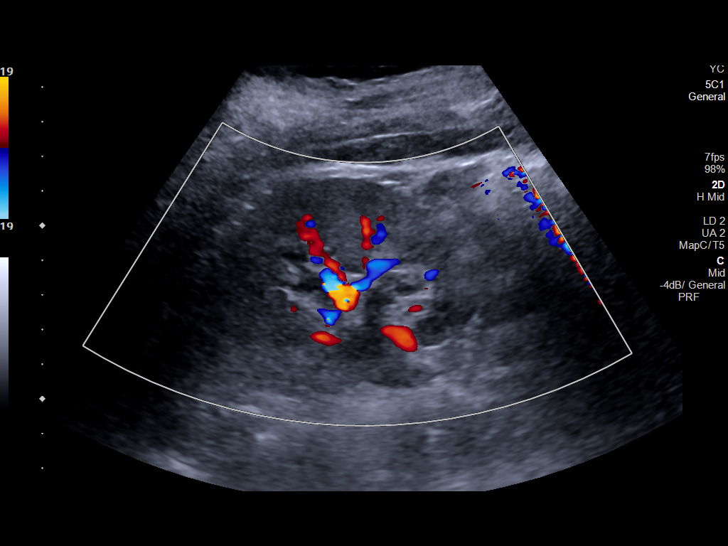
[im 28/51]
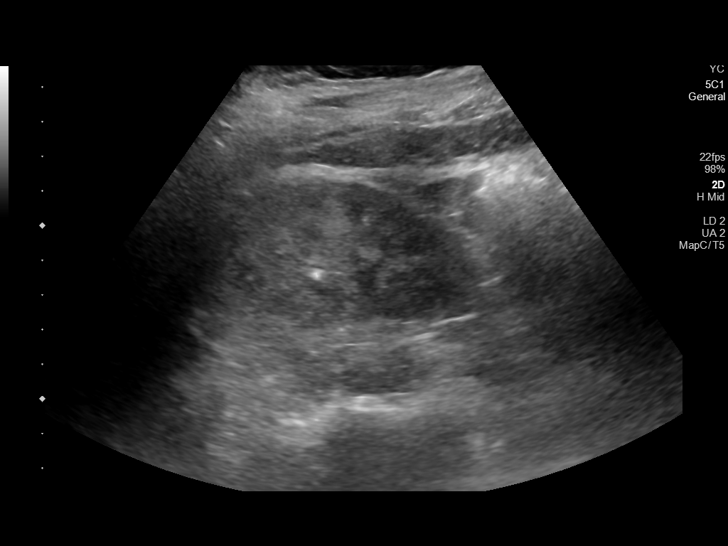
[im 32/51]
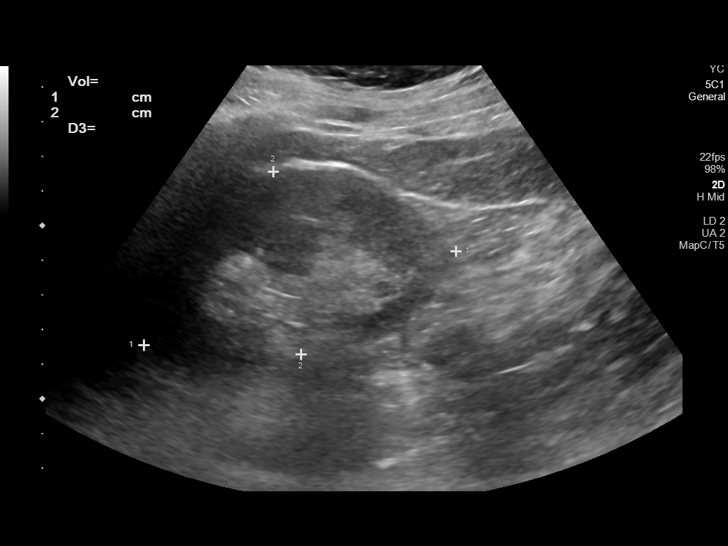
[im 34/51]
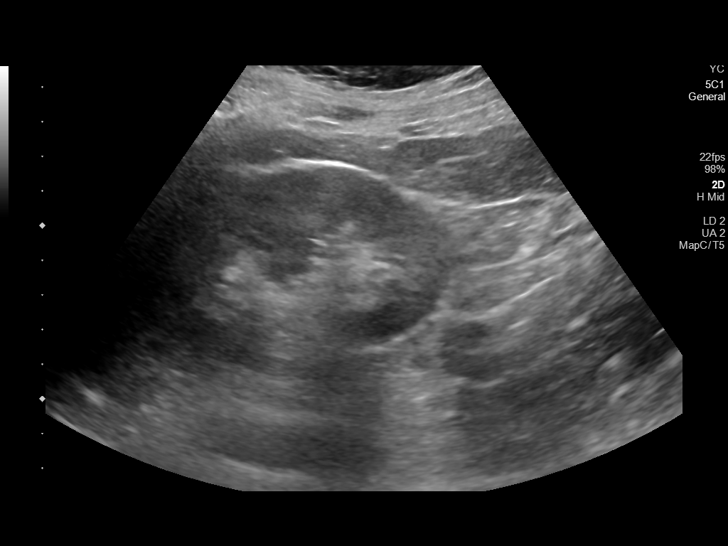
[im 38/51]
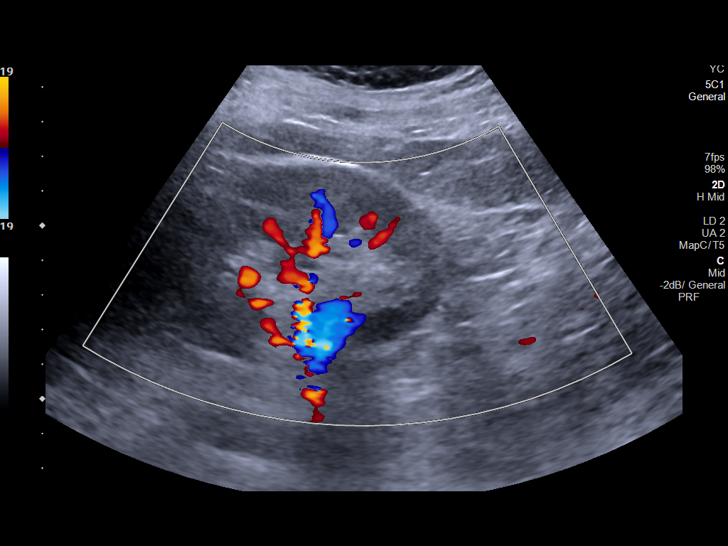
[im 42/51]
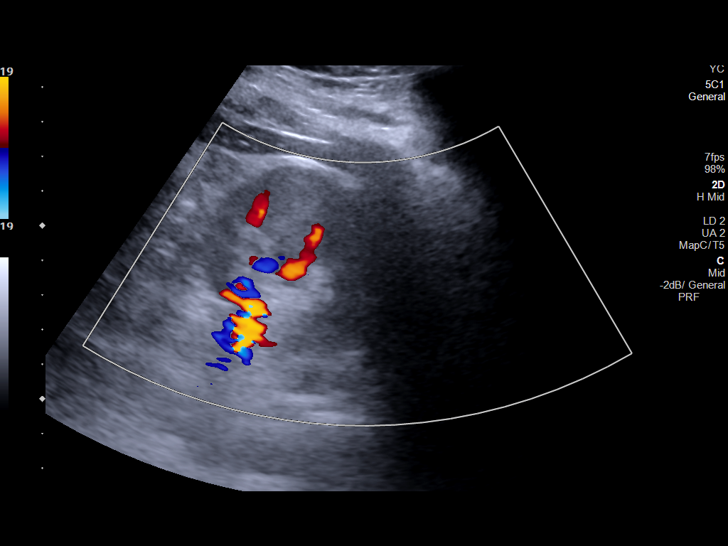
[im 46/51]
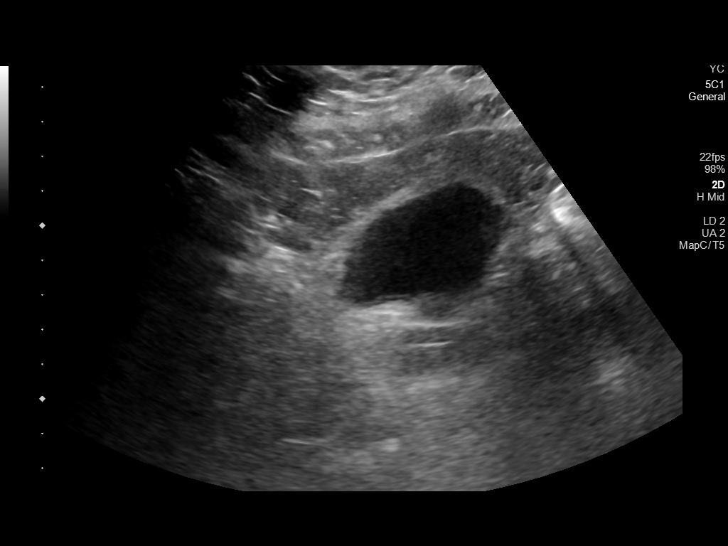
[im 51/51]
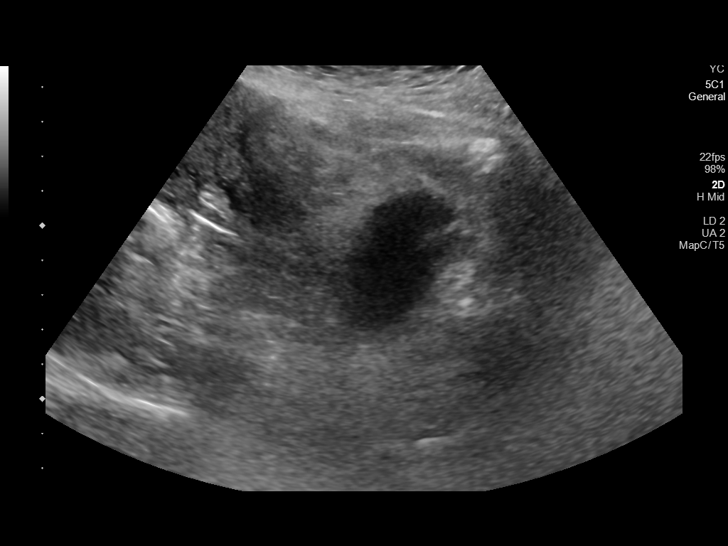

[14 of 25 positions shown; findings below may reference images not displayed]

FINDINGS: Right Kidney:

Renal measurements: 9.1 x 5.9 by 7.2 cm = volume: 202.5 mL.
Echotexture is normal. There is mild right hydronephrosis. 4 mm
calculus is seen within an interpolar calyx. No renal mass.

Left Kidney:

Renal measurements: 9.4 x 5.3 x 6.4 cm = volume: 167.6 mL.
Echogenicity within normal limits. No mass or hydronephrosis
visualized.

Bladder:

Bladder is incompletely distended with no gross abnormalities.

Other:

None.
IMPRESSION: 1. Mild right-sided hydronephrosis.
2. 4 mm right calyceal calculus.

## 2020-09-15 MED ORDER — SODIUM CHLORIDE 0.9 % IV BOLUS
1000.0000 mL | Freq: Once | INTRAVENOUS | Status: AC
  Administered 2020-09-15: 1000 mL via INTRAVENOUS

## 2020-09-15 MED ORDER — HYDROCODONE-ACETAMINOPHEN 5-325 MG PO TABS: 1.0000 | ORAL_TABLET | ORAL | 0 refills | Status: DC | PRN

## 2020-09-15 MED ORDER — IBUPROFEN 400 MG PO TABS
400.0000 mg | ORAL_TABLET | Freq: Four times a day (QID) | ORAL | 0 refills | Status: DC | PRN
Start: 2020-09-15 — End: 2022-03-22

## 2020-09-15 MED ORDER — ONDANSETRON 4 MG PO TBDP
4.0000 mg | ORAL_TABLET | Freq: Three times a day (TID) | ORAL | 0 refills | Status: DC | PRN
Start: 2020-09-15 — End: 2022-03-22

## 2020-09-15 MED ORDER — KETOROLAC TROMETHAMINE 15 MG/ML IJ SOLN
15.0000 mg | Freq: Once | INTRAMUSCULAR | Status: AC
  Administered 2020-09-15: 15 mg via INTRAVENOUS
  Filled 2020-09-15: qty 1

## 2020-09-15 MED ORDER — FENTANYL CITRATE (PF) 100 MCG/2ML IJ SOLN
100.0000 ug | Freq: Once | INTRAMUSCULAR | Status: AC
  Administered 2020-09-15: 100 ug via INTRAVENOUS
  Filled 2020-09-15: qty 2

## 2020-09-15 MED ORDER — MORPHINE SULFATE (PF) 4 MG/ML IV SOLN
4.0000 mg | Freq: Once | INTRAVENOUS | Status: AC
  Administered 2020-09-15: 4 mg via INTRAVENOUS
  Filled 2020-09-15: qty 1

## 2020-09-15 NOTE — ED Provider Notes (Signed)
[facility] DEPT Provider Note   CSN: [id number] Arrival date & time: 09/15/20  1904     History Chief Complaint  Patient presents with  . Abdominal Pain    Paula Warner is a 34 y.o. female.  HPI 34 year old female presents with sudden onset right flank and lower abdominal pain. Radiates from RLQ to right back. Mostly sitting in right flank. No vomiting but some nausea. Pain is currently severe. No fevers. No urinary symptoms. Started her menstrual cycle today. Started 2 hours ago.    Past Medical History:  Diagnosis Date  . Asthma   . History of asthma    no current med.  Marland Kitchen 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  . History of kidney stones   . Menstrual periods irregular     Patient Active Problem List   Diagnosis Date Noted  . Acute gallstone pancreatitis 02/02/2020  . 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 DVT (deep venous thrombosis) ([place]) 07/11/2016  . DVT of lower extremity (deep venous thrombosis) ([place]) 01/24/2016  . High risk sexual behavior 08/04/2015    Past Surgical History:  Procedure Laterality Date  . CESAREAN SECTION     x 2  . CHOLECYSTECTOMY N/A 02/05/2020   Procedure: LAPAROSCOPIC CHOLECYSTECTOMY WITH POSSIBLE INTRAOPERATIVE CHOLANGIOGRAM;  Surgeon: Michael Boston, MD;  Location: [facility];  Service: General;  Laterality: N/A;  . LAPAROSCOPIC TUBAL LIGATION Bilateral 05/08/2017   Procedure: LAPAROSCOPIC TUBAL LIGATION - FILSHIE CLIPS;  Surgeon: Emily Filbert, MD;  Location: [place];  Service: Gynecology;  Laterality: Bilateral;  . LOWER EXTREMITY ANGIOGRAM Left 07/11/2016   Procedure: Percutaneous Venous Thrombectomy with IVUS and  with Stent Placement;  Surgeon: Serafina Mitchell, MD;  Location: [facility];  Service: Vascular;  Laterality: Left;     OB History    Gravida  3   Para  1   Term      Preterm      AB  1   Living  2     SAB      IAB  1    Ectopic      Multiple  0   Live Births  2           Family History  Problem Relation Age of Onset  . Heart murmur Mother   . Asthma Mother   . Clotting disorder Father   . Heart disease Father     Social History   Tobacco Use  . Smoking status: Never Smoker  . Smokeless tobacco: Never Used  Vaping Use  . Vaping Use: Never used  Substance Use Topics  . Alcohol use: No    Alcohol/week: 0.0 standard drinks  . Drug use: No    Home Medications Prior to Admission medications   Medication Sig Start Date End Date Taking? Authorizing Provider  acetaminophen (TYLENOL) 325 MG tablet Take 2 tablets (650 mg total) by mouth every 6 (six) hours as needed (pain). 02/06/20   Florencia Reasons, MD  bismuth subsalicylate (PEPTO BISMOL) 262 MG/15ML suspension Take 30 mLs by mouth every 6 (six) hours as needed for indigestion or diarrhea or loose stools.     [provider]  HYDROcodone-acetaminophen (NORCO) 5-325 MG tablet Take 1 tablet by mouth every 4 (four) hours as needed for severe pain. 09/15/20   Sherwood Gambler, MD  ibuprofen (ADVIL) 400 MG tablet Take 1 tablet (400 mg total) by mouth every 6 (six) hours as  needed. 09/15/20   Sherwood Gambler, MD  medroxyPROGESTERone (PROVERA) 10 MG tablet Take 1 tablet (10 mg total) by mouth daily. 04/11/20   Leftwich-Kirby, Kathie Dike, CNM  ondansetron (ZOFRAN ODT) 4 MG disintegrating tablet Take 1 tablet (4 mg total) by mouth every 8 (eight) hours as needed for nausea or vomiting. 09/15/20   Sherwood Gambler, MD  predniSONE (DELTASONE) 20 MG tablet Take 20 mg by mouth daily. Patient not taking: Reported on 08/29/2020 05/25/20   [provider]  terbinafine (LAMISIL) 250 MG tablet Take 1 tablet (250 mg total) by mouth daily. Patient not taking: Reported on 08/29/2020 06/28/20   Trula Slade, DPM    Allergies    Zinacef [cefuroxime]  Review of Systems   Review of Systems  Constitutional: Negative for fever.  Gastrointestinal: Positive for  abdominal pain and nausea. Negative for vomiting.  Genitourinary: Positive for flank pain and vaginal bleeding. Negative for dysuria.  Musculoskeletal: Positive for back pain.  All other systems reviewed and are negative.   Physical Exam Updated Vital Signs BP 122/79   Pulse 89   Temp 98.1 F (36.7 C) (Oral)   Resp 14   Ht 5\' 1"  (1.549 m)   Wt 74.4 kg   SpO2 95%   BMI 30.99 kg/m   Physical Exam Vitals and nursing note reviewed.  Constitutional:      General: She is not in acute distress.    Appearance: She is well-developed and well-nourished. She is not ill-appearing or diaphoretic.  HENT:     Head: Normocephalic and atraumatic.     Right Ear: External ear normal.     Left Ear: External ear normal.     Nose: Nose normal.  Eyes:     General:        Right eye: No discharge.        Left eye: No discharge.  Cardiovascular:     Rate and Rhythm: Normal rate and regular rhythm.     Heart sounds: Normal heart sounds.  Pulmonary:     Effort: Pulmonary effort is normal.     Breath sounds: Normal breath sounds.  Abdominal:     Palpations: Abdomen is soft.     Tenderness: There is no abdominal tenderness. There is no right CVA tenderness or left CVA tenderness.     Comments: No reproducible tenderness  Skin:    General: Skin is warm and dry.  Neurological:     Mental Status: She is alert.  Psychiatric:        Mood and Affect: Mood is not anxious.     ED Results / Procedures / Treatments   Labs (all labs ordered are listed, but only abnormal results are displayed) Labs Reviewed  COMPREHENSIVE METABOLIC PANEL - Abnormal; Notable for the following components:      Result Value   ALT 51 (*)    Total Bilirubin 1.6 (*)    All other components within normal limits  CBC - Abnormal; Notable for the following components:   WBC 12.8 (*)    All other components within normal limits  URINALYSIS, ROUTINE W REFLEX MICROSCOPIC - Abnormal; Notable for the following components:    APPearance HAZY (*)    Hgb urine dipstick LARGE (*)    Protein, ur 30 (*)    RBC / HPF >50 (*)    Bacteria, UA RARE (*)    All other components within normal limits  LIPASE, BLOOD  I-STAT BETA HCG BLOOD, ED ([facility], [facility], [facility] ONLY)  EKG None  Radiology DG Abdomen 1 View  Result Date: 09/15/2020 CLINICAL DATA:  Right flank and lower quadrant pain EXAM: ABDOMEN - 1 VIEW COMPARISON:  Renal ultrasound 09/15/2020 FINDINGS: Coarse calcification projecting over the lower pole right kidney compatible with a calculus seen on comparison ultrasound. There is an additional coarse 4 mm calcification projecting superolateral to the bladder shadow which could reflect a distal ureteral calculus particularly given the presence of mild right hydronephrosis on the comparison exam. Few radiodensities overlying the left renal shadow are nonspecific and may be within the overlying bowel. Prior cholecystectomy. No obstructive bowel gas pattern. Stenting, likely venous projects over the left hemipelvis, may correlate with patient history of DVT, possible underlying May-Turner. Bilateral tubal ligation clips. Osseous structures are unremarkable. IMPRESSION: 1. Right nephrolithiasis. Coarse calcification towards the right superolateral aspect of the bladder may reflect a small distal ureteral calculus given the presence of hydronephrosis on comparison ultrasound. 2. Calcifications of the left renal shadow are less specific given overlying bowel. 3. Prior cholecystectomy, tubal ligation. 4. Stenting, likely venous projects over the left hemipelvis, may correlate with patient history of DVT. Electronically Signed   By: Lovena Le M.D.   On: 09/15/2020 20:31   US Renal  Result Date: 09/15/2020 CLINICAL DATA:  Right flank pain for 3 hours EXAM: RENAL / URINARY TRACT ULTRASOUND COMPLETE COMPARISON:  02/02/2020 FINDINGS: Right Kidney: Renal measurements: 9.1 x 5.9 by 7.2 cm = volume: 202.5 mL. Echotexture is normal. There is  mild right hydronephrosis. 4 mm calculus is seen within an interpolar calyx. No renal mass. Left Kidney: Renal measurements: 9.4 x 5.3 x 6.4 cm = volume: 167.6 mL. Echogenicity within normal limits. No mass or hydronephrosis visualized. Bladder: Bladder is incompletely distended with no gross abnormalities. Other: None. IMPRESSION: 1. Mild right-sided hydronephrosis. 2. 4 mm right calyceal calculus. Electronically Signed   By: Randa Ngo M.D.   On: 09/15/2020 20:12    Procedures Procedures (including critical care time)  Medications Ordered in ED Medications  fentaNYL (SUBLIMAZE) injection 100 mcg (100 mcg Intravenous Given 09/15/20 1949)  sodium chloride 0.9 % bolus 1,000 mL (0 mLs Intravenous Stopped 09/15/20 2236)  ketorolac (TORADOL) 15 MG/ML injection 15 mg (15 mg Intravenous Given 09/15/20 2129)  morphine 4 MG/ML injection 4 mg (4 mg Intravenous Given 09/15/20 2129)    ED Course  I have reviewed the triage vital signs and the nursing notes.  Pertinent labs & imaging results that were available during my care of the patient were reviewed by me and considered in my medical decision making (see chart for details).    MDM Rules/Calculators/A&P                          Patient symptoms are most consistent with a right ureteral stone.  She has had multiple kidney stones in the past.  She has also had multiple CTs in the past.  At this point, given I was able to get her pain under control I opted for ultrasound as far as evaluation.  There is some hydronephrosis which makes me suspect that this is a ureteral stone.  My suspicion for appendicitis, torsion, etc. is very low.  We discussed deferring CT imaging and if her symptoms worsen she may need to return for that.  Otherwise, we will treat with hydrocodone and ibuprofen.  Follow-up with urology.  No obvious sign of an infection. Final Clinical Impression(s) / ED Diagnoses Final diagnoses:  Right  ureteral stone    Rx / DC Orders ED  Discharge Orders         Ordered    HYDROcodone-acetaminophen (NORCO) 5-325 MG tablet  Every 4 hours PRN        09/15/20 2217    ibuprofen (ADVIL) 400 MG tablet  Every 6 hours PRN        09/15/20 2217    ondansetron (ZOFRAN ODT) 4 MG disintegrating tablet  Every 8 hours PRN        09/15/20 2226           Sherwood Gambler, MD 09/15/20 2309

## 2020-09-15 NOTE — ED Triage Notes (Signed)
Patient is complaining of right flank pain and right lower quadrant pain. Patient brought in by [facility] from home. Patient states has been going on for about an hour with nausea. Diarrhea yesterday.

## 2020-09-15 NOTE — ED Notes (Signed)
Patient complaining of returning pain, MD at bedside

## 2020-09-15 NOTE — Discharge Instructions (Signed)
If you develop worsening, continued, or recurrent abdominal pain, uncontrolled vomiting, fever, chest or back pain, or any other new/concerning symptoms then return to the ER for evaluation.  

## 2020-09-17 ENCOUNTER — Encounter (HOSPITAL_COMMUNITY): Payer: Self-pay

## 2020-09-17 ENCOUNTER — Emergency Department (HOSPITAL_COMMUNITY): Payer: 59

## 2020-09-17 ENCOUNTER — Other Ambulatory Visit: Payer: Self-pay

## 2020-09-17 ENCOUNTER — Emergency Department (HOSPITAL_COMMUNITY)
Admission: EM | Admit: 2020-09-17 | Discharge: 2020-09-18 | Disposition: A | Discharge status: Home / Self Care | Payer: 59 | Attending: Emergency Medicine | Admitting: Emergency Medicine

## 2020-09-17 DIAGNOSIS — N201 Calculus of ureter: Secondary | ICD-10-CM

## 2020-09-17 DIAGNOSIS — N211 Calculus in urethra: Secondary | ICD-10-CM | POA: Diagnosis not present

## 2020-09-17 DIAGNOSIS — J45909 Unspecified asthma, uncomplicated: Secondary | ICD-10-CM | POA: Diagnosis not present

## 2020-09-17 DIAGNOSIS — R1110 Vomiting, unspecified: Secondary | ICD-10-CM | POA: Diagnosis not present

## 2020-09-17 DIAGNOSIS — R109 Unspecified abdominal pain: Secondary | ICD-10-CM | POA: Diagnosis present

## 2020-09-17 LAB — COMPREHENSIVE METABOLIC PANEL
ALT: 79 U/L — ABNORMAL HIGH (ref 0–44)
AST: 69 U/L — ABNORMAL HIGH (ref 15–41)
Albumin: 3.9 g/dL (ref 3.5–5.0)
Alkaline Phosphatase: 89 U/L (ref 38–126)
Anion gap: 8 (ref 5–15)
BUN: 18 mg/dL (ref 6–20)
CO2: 27 mmol/L (ref 22–32)
Calcium: 8.9 mg/dL (ref 8.9–10.3)
Chloride: 104 mmol/L (ref 98–111)
Creatinine, Ser: 1.22 mg/dL — ABNORMAL HIGH (ref 0.44–1.00)
GFR, Estimated: >60 mL/min (ref >60)
Glucose, Bld: 89 mg/dL (ref 70–99)
Potassium: 3.5 mmol/L (ref 3.5–5.1)
Sodium: 139 mmol/L (ref 135–145)
Total Bilirubin: 2.7 mg/dL — ABNORMAL HIGH (ref 0.3–1.2)
Total Protein: 7.8 g/dL (ref 6.5–8.1)

## 2020-09-17 LAB — URINALYSIS, ROUTINE W REFLEX MICROSCOPIC
Bacteria, UA: NONE SEEN
Bilirubin Urine: NEGATIVE
Glucose, UA: NEGATIVE mg/dL
Ketones, ur: 20 mg/dL — AB
Leukocytes,Ua: NEGATIVE
Nitrite: NEGATIVE
Protein, ur: 30 mg/dL — AB
Specific Gravity, Urine: 1.033 — ABNORMAL HIGH (ref 1.005–1.030)
pH: 5 (ref 5.0–8.0)

## 2020-09-17 LAB — I-STAT BETA HCG BLOOD, ED (MC, WL, AP ONLY): I-stat hCG, quantitative: <5 m[IU]/mL (ref <5)

## 2020-09-17 LAB — CBC
HCT: 41.2 % (ref 36.0–46.0)
Hemoglobin: 13.7 g/dL (ref 12.0–15.0)
MCH: 32.2 pg (ref 26.0–34.0)
MCHC: 33.3 g/dL (ref 30.0–36.0)
MCV: 96.7 fL (ref 80.0–100.0)
Platelets: 258 10*3/uL (ref 150–400)
RBC: 4.26 MIL/uL (ref 3.87–5.11)
RDW: 12.3 % (ref 11.5–15.5)
WBC: 13 10*3/uL — ABNORMAL HIGH (ref 4.0–10.5)
nRBC: 0 % (ref 0.0–0.2)

## 2020-09-17 LAB — LIPASE, BLOOD: Lipase: 24 U/L (ref 11–51)

## 2020-09-17 IMAGING — CT CT RENAL STONE PROTOCOL
2 of 4 series · 16 of 46 positions shown, 18 images · non-contrast
Comparison: CT abdomen pelvis [DATE]

CLINICAL DATA: Flank pain.  Kidney stone suspected.

EXAM:
CT ABDOMEN AND PELVIS WITHOUT CONTRAST
TECHNIQUE: Multidetector CT imaging of the abdomen and pelvis was performed
following the standard protocol without IV contrast.

[Series 2: axial st · axial · 0.75mm/px · z∈[-469,-69]mm · 13 of 90 slices shown, 15 images]
[im 5/90  soft-tissue]
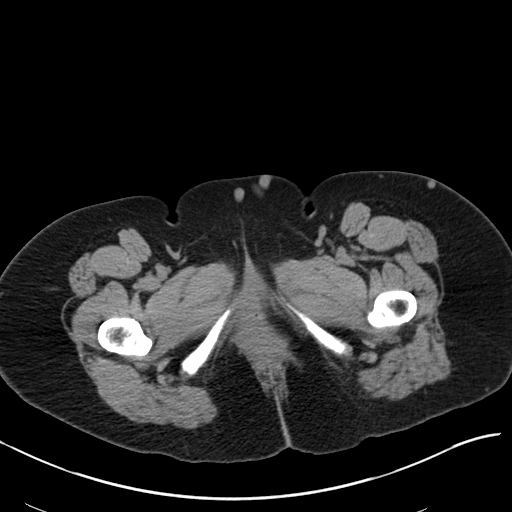
[im 5/90  bone]
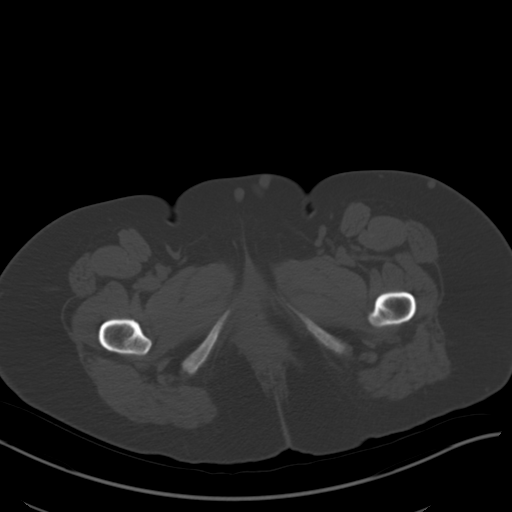
[im 13/90  soft-tissue]
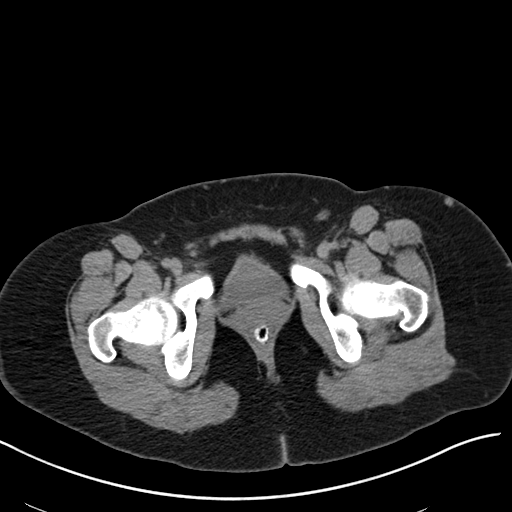
[im 17/90  soft-tissue]
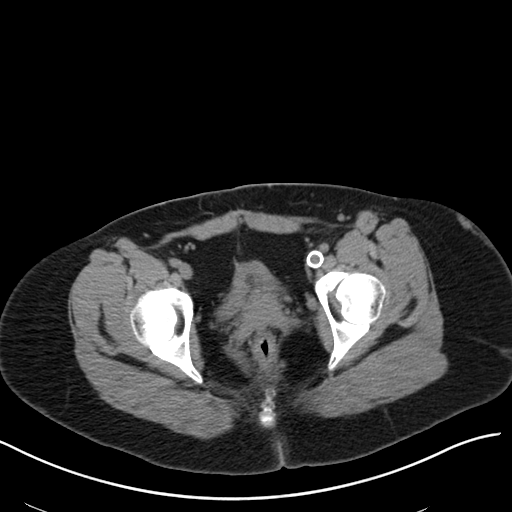
[im 26/90  soft-tissue]
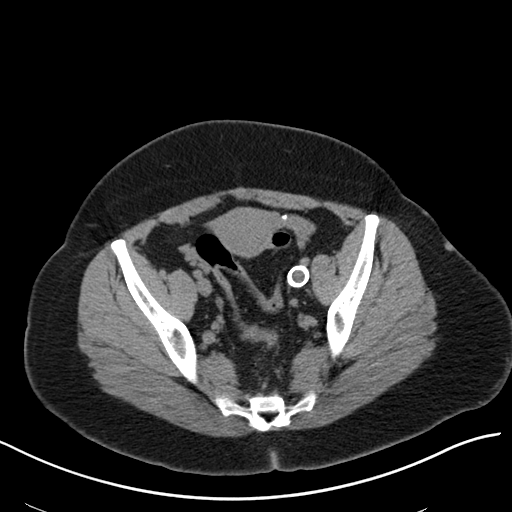
[im 30/90  soft-tissue]
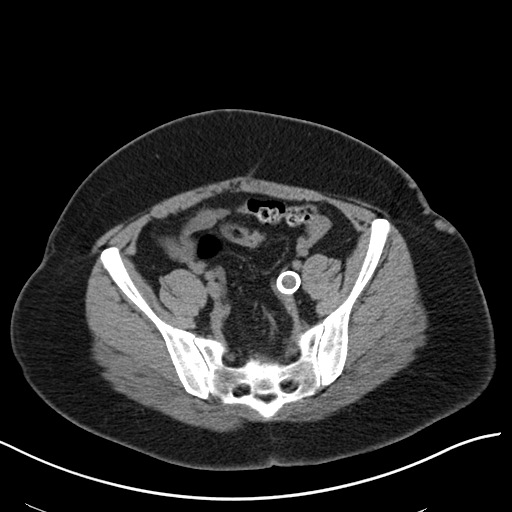
[im 39/90  soft-tissue]
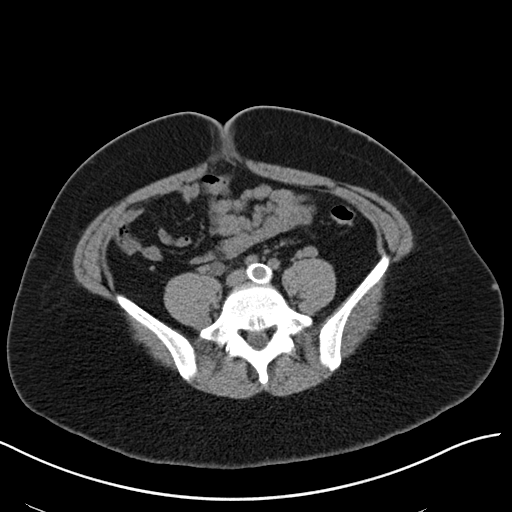
[im 47/90  soft-tissue]
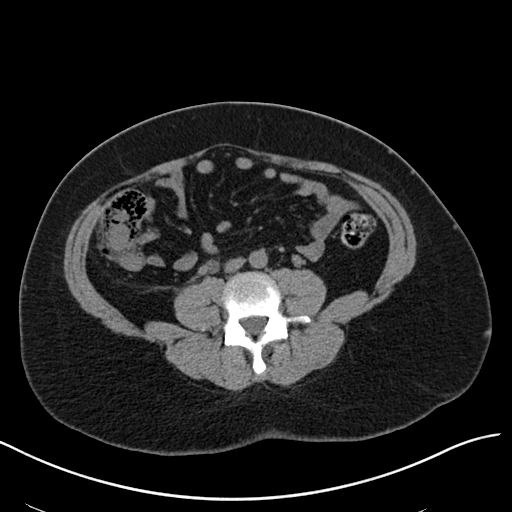
[im 51/90  soft-tissue]
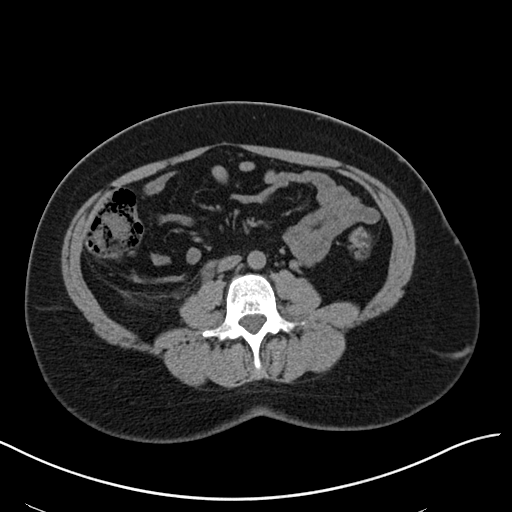
[im 60/90  soft-tissue]
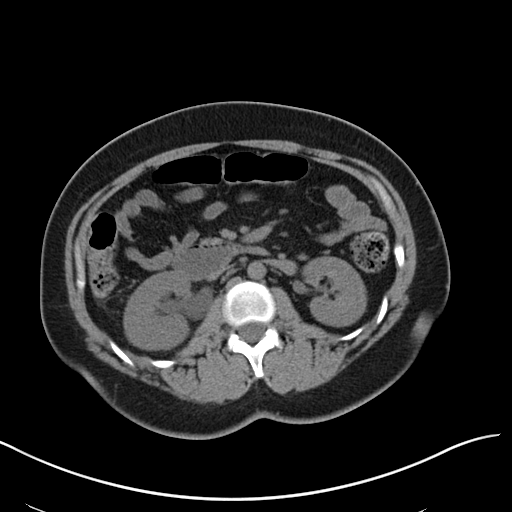
[im 60/90  bone]
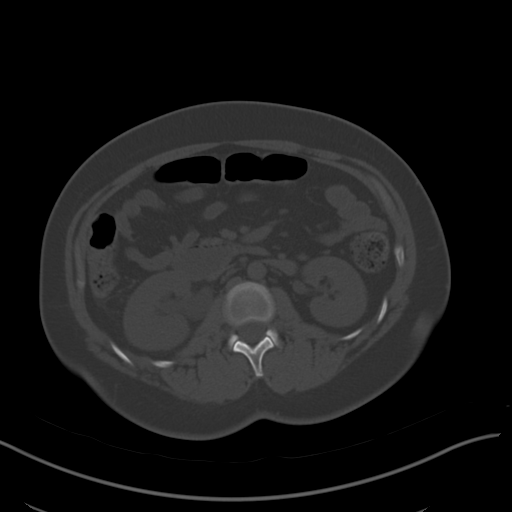
[im 64/90  soft-tissue]
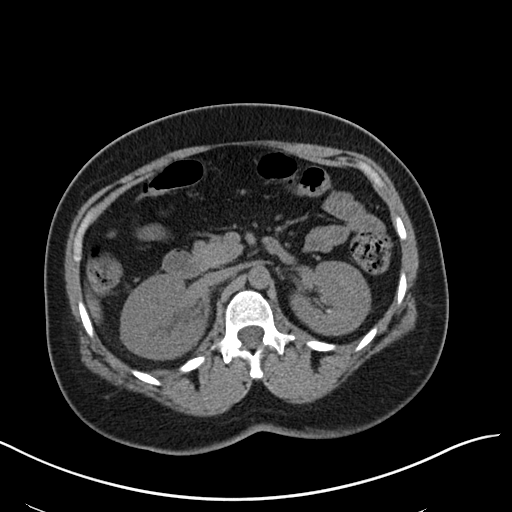
[im 73/90  soft-tissue]
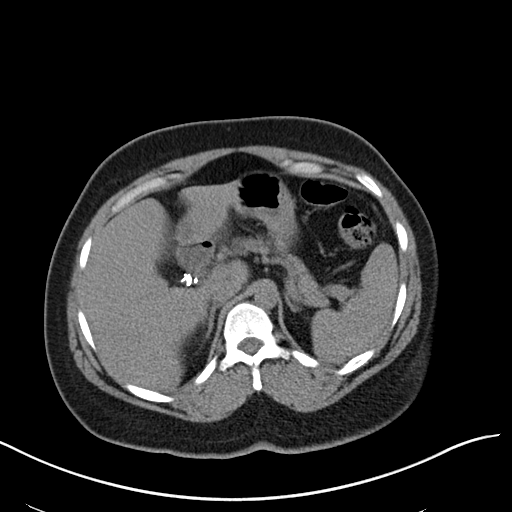
[im 77/90  soft-tissue]
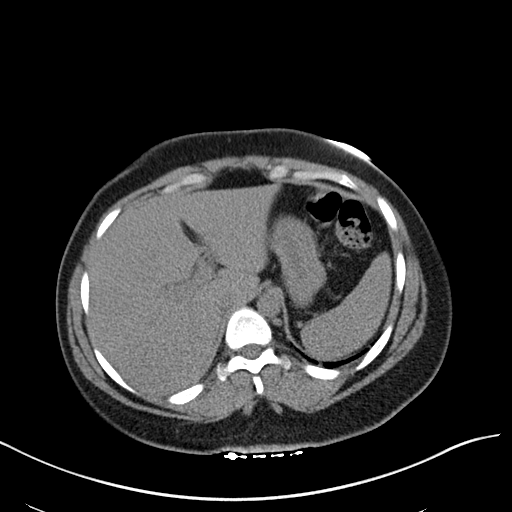
[im 85/90  soft-tissue]
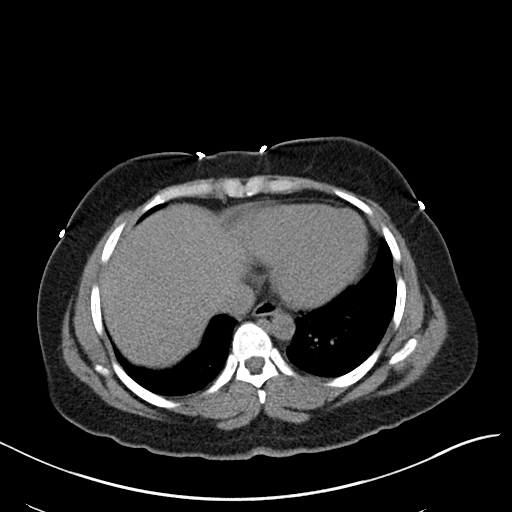

[Series 5: coronal · coronal · 0.78mm/px · 3 of 150 slices shown]
[im 50/150  soft-tissue]
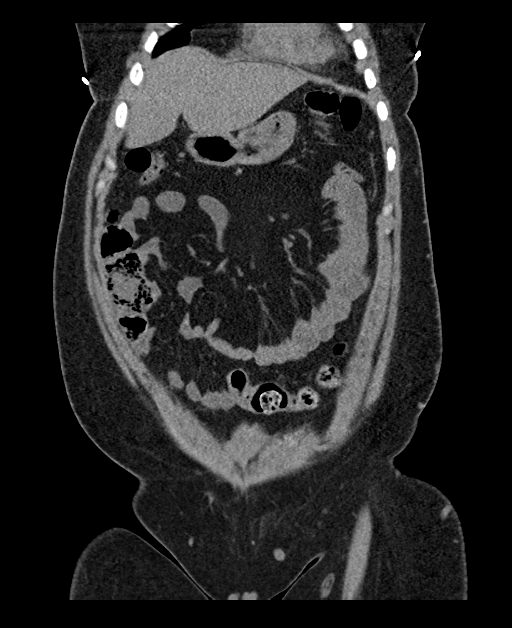
[im 67/150  soft-tissue]
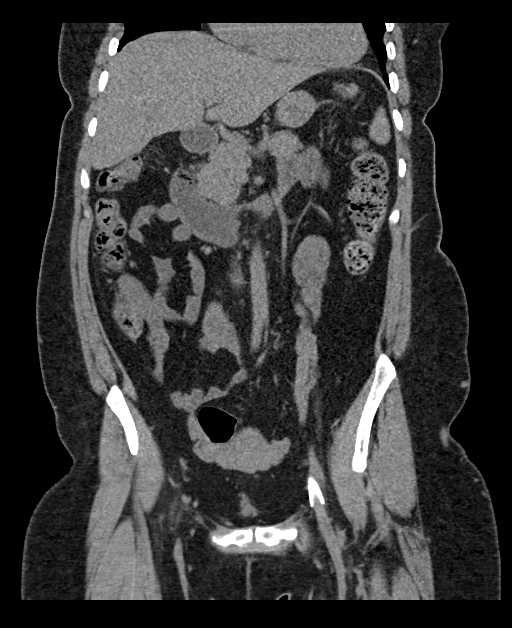
[im 83/150  soft-tissue]
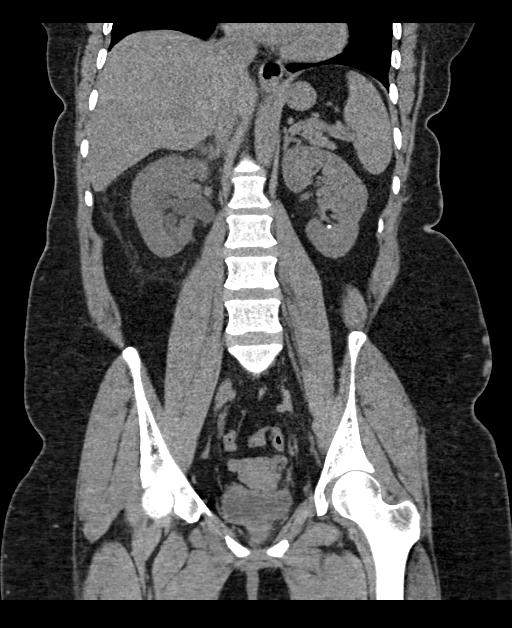

[16 of 46 positions shown; findings below may reference images not displayed]

FINDINGS: Lower chest: Left lower lobe linear atelectasis versus scarring.
Bilateral lower lobe subsegmental atelectasis. No acute abnormality.

Hepatobiliary: No focal liver abnormality is seen. Status post
cholecystectomy. No biliary dilatation.

Pancreas: No focal lesion. Normal pancreatic contour. No surrounding
inflammatory changes. No main pancreatic ductal dilatation.

Spleen: Normal in size without focal abnormality.

Adrenals/Urinary Tract: No adrenal nodule bilaterally.

Trace right perinephric strep at stranding. Larger in size right
renal parenchyma compared to the left. Bilateral 1-2 mm calcified
stones within the kidney mass. Mild right hydronephrosis. No left
hydronephrosis. Mild right hydroureter with a 3 mm calcified stone
at the right ureterovesicular junction. No left hydroureter or
ureterolithiasis. No contour-deforming renal mass.

The urinary bladder is unremarkable.

Stomach/Bowel: Stomach is within normal limits. No evidence of bowel
wall thickening or dilatation. Appendix appears normal.

Vascular/Lymphatic: No abdominal aorta or iliac aneurysm. Incomplete
evaluation of the 2 consecutive left common iliac/external iliac
venous stent. Similar-appearing calcification noted within the left
common vein. No abdominal, pelvic, or inguinal lymphadenopathy.
Varicose veins noted within the proximal exam extremity/subcutaneus
soft tissues of the pubic area.

Reproductive: Uterus and bilateral adnexa are unremarkable.
Bilateral tubal ligation.

Other: No intraperitoneal free fluid. No intraperitoneal free gas.
No organized fluid collection.

Musculoskeletal:

No abdominal wall hernia or abnormality.

No suspicious lytic or blastic osseous lesions. No acute displaced
fracture.
IMPRESSION: 1. Obstructive 3 mm right ureterovesicular junction stone. Correlate
with urinalysis to exclude overlying infection.
2. Nonobstructive bilateral 1-2 mm nephrolithiasis.
3. Left common iliac/external iliac venous stent incompletely
evaluated on this noncontrast study.

## 2020-09-17 MED ORDER — PROMETHAZINE HCL 25 MG RE SUPP: 25.0000 mg | Freq: Three times a day (TID) | RECTAL | 0 refills | Status: DC | PRN

## 2020-09-17 MED ORDER — TAMSULOSIN HCL 0.4 MG PO CAPS: 0.4000 mg | ORAL_CAPSULE | Freq: Every day | ORAL | 0 refills | Status: DC | PRN

## 2020-09-17 MED ORDER — PROMETHAZINE HCL 25 MG/ML IJ SOLN
12.5000 mg | Freq: Once | INTRAMUSCULAR | Status: AC
  Administered 2020-09-18: 12.5 mg via INTRAVENOUS
  Filled 2020-09-17: qty 1

## 2020-09-17 MED ORDER — LACTATED RINGERS IV BOLUS
1000.0000 mL | Freq: Once | INTRAVENOUS | Status: AC
  Administered 2020-09-17: 1000 mL via INTRAVENOUS

## 2020-09-17 MED ORDER — PROMETHAZINE HCL 25 MG/ML IJ SOLN
12.5000 mg | Freq: Once | INTRAMUSCULAR | Status: AC
  Administered 2020-09-17: 12.5 mg via INTRAVENOUS
  Filled 2020-09-17: qty 1

## 2020-09-17 MED ORDER — KETOROLAC TROMETHAMINE 15 MG/ML IJ SOLN
15.0000 mg | Freq: Once | INTRAMUSCULAR | Status: AC
  Administered 2020-09-18: 15 mg via INTRAVENOUS
  Filled 2020-09-17: qty 1

## 2020-09-17 NOTE — ED Triage Notes (Signed)
Pt presents with c/o abdominal pain and back pain that started 2 days ago. Pt also reports some vomiting with the abdominal pain.

## 2020-09-17 NOTE — ED Provider Notes (Signed)
[facility] DEPT Provider Note   CSN: [id number] Arrival date & time: 09/17/20  1715     History Chief Complaint  Patient presents with  . Abdominal Pain  . Back Pain    Paula Warner is a 34 y.o. 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  She was seen for the same 2 days ago.  At that point they obtained a 1 view abdomen and a renal ultrasound and right-sided hydronephrosis.  She reports that she has had worsening vomiting, that Zofran is not working.  She last had a dose at midnight and then vomited at 4 AM.  She states that she is not tolerated any food or drink in the past 24 hours.  HPI     Past Medical History:  Diagnosis Date  . Asthma   . History of asthma    no current med.  Marland Kitchen 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  . History of kidney stones   . Menstrual periods irregular     Patient Active Problem List   Diagnosis Date Noted  . Acute gallstone pancreatitis 02/02/2020  . 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 DVT (deep venous thrombosis) ([place]) 07/11/2016  . DVT of lower extremity (deep venous thrombosis) ([place]) 01/24/2016  . High risk sexual behavior 08/04/2015    Past Surgical History:  Procedure Laterality Date  . CESAREAN SECTION     x 2  . CHOLECYSTECTOMY N/A 02/05/2020   Procedure: LAPAROSCOPIC CHOLECYSTECTOMY WITH POSSIBLE INTRAOPERATIVE CHOLANGIOGRAM;  Surgeon: Michael Boston, MD;  Location: [facility];  Service: General;  Laterality: N/A;  . LAPAROSCOPIC TUBAL LIGATION Bilateral 05/08/2017   Procedure: LAPAROSCOPIC TUBAL LIGATION - FILSHIE CLIPS;  Surgeon: Emily Filbert, MD;  Location: [facility];  Service: Gynecology;  Laterality: Bilateral;  . LOWER EXTREMITY ANGIOGRAM Left 07/11/2016   Procedure: Percutaneous Venous Thrombectomy with IVUS and  with Stent  Placement;  Surgeon: Serafina Mitchell, MD;  Location: [facility];  Service: Vascular;  Laterality: Left;     OB History    Gravida  3   Para  1   Term      Preterm      AB  1   Living  2     SAB      IAB  1   Ectopic      Multiple  0   Live Births  2           Family History  Problem Relation Age of Onset  . Heart murmur Mother   . Asthma Mother   . Clotting disorder Father   . Heart disease Father     Social History   Tobacco Use  . Smoking status: Never Smoker  . Smokeless tobacco: Never Used  Vaping Use  . Vaping Use: Never used  Substance Use Topics  . Alcohol use: No    Alcohol/week: 0.0 standard drinks  . Drug use: No    Home Medications Prior to Admission medications   Medication Sig Start Date End Date Taking? Authorizing Provider  acetaminophen (TYLENOL) 325 MG tablet Take 2 tablets (650 mg total) by mouth every 6 (six) hours as needed (pain). 02/06/20   Florencia Reasons, MD  bismuth subsalicylate (PEPTO BISMOL) 262 MG/15ML suspension Take 30 mLs by mouth every 6 (six) hours as needed for indigestion or diarrhea or loose stools.  [provider]  HYDROcodone-acetaminophen (NORCO) 5-325 MG tablet Take 1 tablet by mouth every 4 (four) hours as needed for severe pain. 09/15/20   Sherwood Gambler, MD  ibuprofen (ADVIL) 400 MG tablet Take 1 tablet (400 mg total) by mouth every 6 (six) hours as needed. 09/15/20   Sherwood Gambler, MD  medroxyPROGESTERone (PROVERA) 10 MG tablet Take 1 tablet (10 mg total) by mouth daily. 04/11/20   Leftwich-Kirby, Kathie Dike, CNM  ondansetron (ZOFRAN ODT) 4 MG disintegrating tablet Take 1 tablet (4 mg total) by mouth every 8 (eight) hours as needed for nausea or vomiting. 09/15/20   Sherwood Gambler, MD  predniSONE (DELTASONE) 20 MG tablet Take 20 mg by mouth daily. Patient not taking: Reported on 08/29/2020 05/25/20   [provider]  promethazine (PHENERGAN) 25 MG suppository Place 1 suppository (25 mg total) rectally  every 8 (eight) hours as needed for nausea or vomiting. 09/17/20   Lorin Glass, PA-C  tamsulosin (FLOMAX) 0.4 MG CAPS capsule Take 1 capsule (0.4 mg total) by mouth daily as needed (Kidney stone pain). 09/17/20   Lorin Glass, PA-C  terbinafine (LAMISIL) 250 MG tablet Take 1 tablet (250 mg total) by mouth daily. Patient not taking: Reported on 08/29/2020 06/28/20   Trula Slade, DPM    Allergies    Zinacef [cefuroxime]  Review of Systems   Review of Systems  Constitutional: Negative for chills and fever.  Eyes: Negative for visual disturbance.  Respiratory: Negative for shortness of breath.   Gastrointestinal: Positive for abdominal pain, nausea and vomiting. Negative for constipation and diarrhea.  Genitourinary: Positive for flank pain. Negative for dysuria, urgency, vaginal bleeding and vaginal discharge.  Musculoskeletal: Positive for back pain.  Neurological: Negative for weakness.  All other systems reviewed and are negative.   Physical Exam Updated Vital Signs BP 126/77 (BP Location: Right Arm)   Pulse 72   Temp 98.9 F (37.2 C) (Oral)   Resp 16   Ht 5\' 1"  (1.549 m)   Wt 72.6 kg   SpO2 96%   BMI 30.23 kg/m   Physical Exam Vitals and nursing note reviewed.  Constitutional:      General: She is not in acute distress.    Appearance: She is well-developed and well-nourished.  HENT:     Head: Normocephalic and atraumatic.  Eyes:     Conjunctiva/sclera: Conjunctivae normal.  Cardiovascular:     Rate and Rhythm: Normal rate and regular rhythm.     Heart sounds: No murmur heard.   Pulmonary:     Effort: Pulmonary effort is normal. No respiratory distress.     Breath sounds: Normal breath sounds.  Abdominal:     General: Abdomen is flat.     Palpations: Abdomen is soft.     Tenderness: There is no abdominal tenderness. There is right CVA tenderness. There is no guarding or rebound.     Hernia: No hernia is present.  Musculoskeletal:         General: No edema.     Cervical back: Neck supple.  Skin:    General: Skin is warm and dry.  Neurological:     Mental Status: She is alert.     Comments: Patient is awake and alert, answers all questions appropriately.  Speech is not slurred.  Psychiatric:        Mood and Affect: Mood and affect and mood normal.        Behavior: Behavior normal.     ED Results /  Procedures / Treatments   Labs (all labs ordered are listed, but only abnormal results are displayed) Labs Reviewed  COMPREHENSIVE METABOLIC PANEL - Abnormal; Notable for the following components:      Result Value   Creatinine, Ser 1.22 (*)    AST 69 (*)    ALT 79 (*)    Total Bilirubin 2.7 (*)    All other components within normal limits  CBC - Abnormal; Notable for the following components:   WBC 13.0 (*)    All other components within normal limits  URINALYSIS, ROUTINE W REFLEX MICROSCOPIC - Abnormal; Notable for the following components:   APPearance HAZY (*)    Specific Gravity, Urine 1.033 (*)    Hgb urine dipstick MODERATE (*)    Ketones, ur 20 (*)    Protein, ur 30 (*)    All other components within normal limits  LIPASE, BLOOD  I-STAT BETA HCG BLOOD, ED ([facility], [facility], [facility] ONLY)    EKG None  Radiology CT Renal Stone Study  Result Date: 09/17/2020 CLINICAL DATA:  Flank pain.  Kidney stone suspected. EXAM: CT ABDOMEN AND PELVIS WITHOUT CONTRAST TECHNIQUE: Multidetector CT imaging of the abdomen and pelvis was performed following the standard protocol without IV contrast. COMPARISON:  CT abdomen pelvis 02/02/2020 FINDINGS: Lower chest: Left lower lobe linear atelectasis versus scarring. Bilateral lower lobe subsegmental atelectasis. No acute abnormality. Hepatobiliary: No focal liver abnormality is seen. Status post cholecystectomy. No biliary dilatation. Pancreas: No focal lesion. Normal pancreatic contour. No surrounding inflammatory changes. No main pancreatic ductal dilatation. Spleen: Normal in size without  focal abnormality. Adrenals/Urinary Tract: No adrenal nodule bilaterally. Trace right perinephric strep at stranding. Larger in size right renal parenchyma compared to the left. Bilateral 1-2 mm calcified stones within the kidney mass. Mild right hydronephrosis. No left hydronephrosis. Mild right hydroureter with a 3 mm calcified stone at the right ureterovesicular junction. No left hydroureter or ureterolithiasis. No contour-deforming renal mass. The urinary bladder is unremarkable. Stomach/Bowel: Stomach is within normal limits. No evidence of bowel wall thickening or dilatation. Appendix appears normal. Vascular/Lymphatic: No abdominal aorta or iliac aneurysm. Incomplete evaluation of the 2 consecutive left common iliac/external iliac venous stent. Similar-appearing calcification noted within the left common vein. No abdominal, pelvic, or inguinal lymphadenopathy. Varicose veins noted within the proximal exam extremity/subcutaneus soft tissues of the pubic area. Reproductive: Uterus and bilateral adnexa are unremarkable. Bilateral tubal ligation. Other: No intraperitoneal free fluid. No intraperitoneal free gas. No organized fluid collection. Musculoskeletal: No abdominal wall hernia or abnormality. No suspicious lytic or blastic osseous lesions. No acute displaced fracture. IMPRESSION: 1. Obstructive 3 mm right ureterovesicular junction stone. Correlate with urinalysis to exclude overlying infection. 2. Nonobstructive bilateral 1-2 mm nephrolithiasis. 3. Left common iliac/external iliac venous stent incompletely evaluated on this noncontrast study. Electronically Signed   By: Iven Finn M.D.   On: 09/17/2020 22:17    Procedures Procedures (including critical care time)  Medications Ordered in ED Medications  lactated ringers bolus 1,000 mL (0 mLs Intravenous Stopped 09/18/20 0005)  promethazine (PHENERGAN) injection 12.5 mg (12.5 mg Intravenous Given 09/17/20 2155)  ketorolac (TORADOL) 15 MG/ML  injection 15 mg (15 mg Intravenous Given 09/18/20 0004)  promethazine (PHENERGAN) injection 12.5 mg (12.5 mg Intravenous Given 09/18/20 0005)    ED Course  I have reviewed the triage vital signs and the nursing notes.  Pertinent labs & imaging results that were available during my care of the patient were reviewed by me and considered in my medical  decision making (see chart for details).    MDM Rules/Calculators/A&P                         Patient is a 34 year old woman who presents today for evaluation of continuing right sided flank and abdominal pain that is attributed to renal stone.  She has worsening vomiting and reports that she is unable to tolerate PO despite zofran.  CT scan is obtained given her worsening vomiting and failing to improve pain wise.  This shows a obstructive 3 mm right UVJ stone.  UA does not show evidence of infection with no bacteria seen.  This appears to be the cause of patient's symptoms.  She is treated with Phenergan and IV fluid in the emergency room.    Patient is reevaluated after this, reports she still feels nauseous and is having pain.  We discussed her results.  Plan to give second dose of 12.5 mg of Phenergan and IV Toradol with p.o. challenge. If patient passes her p.o. challenge she can go home. Prescriptions are sent for Flomax and Phenergan PR.  Return precautions were discussed with patient who states their understanding.    Note: Portions of this report may have been transcribed using voice recognition software. Every effort was made to ensure accuracy; however, inadvertent computerized transcription errors may be present   Final Clinical Impression(s) / ED Diagnoses Final diagnoses:  Right ureteral stone    Rx / DC Orders ED Discharge Orders         Ordered    promethazine (PHENERGAN) 25 MG suppository  Every 8 hours PRN        09/17/20 2350    tamsulosin (FLOMAX) 0.4 MG CAPS capsule  Daily PRN        09/17/20 2350            Lorin Glass, PA-C 09/18/20 Shelah Lewandowsky    Gareth Morgan, MD 09/19/20 [phone]

## 2020-09-17 NOTE — Discharge Instructions (Signed)
Today you received medications that may make you sleepy or impair your ability to make decisions.  For the next 24 hours please do not drive, operate heavy machinery, care for a small child with out another adult present, or perform any activities that may cause harm to you or someone else if you were to fall asleep or be impaired.   You are being prescribed a medication which may make you sleepy. Please follow up of listed precautions for at least 24 hours after taking one dose.

## 2020-09-18 NOTE — ED Provider Notes (Signed)
  Physical Exam  BP 126/77 (BP Location: Right Arm)   Pulse 72   Temp 98.9 F (37.2 C) (Oral)   Resp 16   Ht 5\' 1"  (1.549 m)   Wt 72.6 kg   SpO2 96%   BMI 30.23 kg/m   Physical Exam  Gen: nontoxic GI: drinking water in the room without signs of pain or nausea  ED Course/Procedures     Procedures  MDM   Patient signed out to me by E. Phylliss Bob, PA-C.  Please see previous notes for further history.  In brief, patient presenting for persistent nausea in the setting of a known kidney stone.  She has been using Zofran at home without improvement.  Repeat scan today was negative for concerning findings, urine without infection.  Labs overall reassuring.  She is pending symptomatic treatment, p.o. challenge, likely discharge.  On reassessment after medication, patient reports improvement of pain and nausea.  She is tolerating p.o. without difficulty.  I discussed continued symptomatic treatment at home.  At this time, patient appears safe for discharged.  Return precautions given.  Patient states she understands and agrees to plan.    Franchot Heidelberg, PA-C 09/18/20 0109    Shanon Rosser, MD 09/18/20 [phone]

## 2021-01-10 DIAGNOSIS — K120 Recurrent oral aphthae: Secondary | ICD-10-CM | POA: Diagnosis not present

## 2021-02-21 DIAGNOSIS — N393 Stress incontinence (female) (male): Secondary | ICD-10-CM | POA: Diagnosis not present

## 2021-02-21 DIAGNOSIS — Z124 Encounter for screening for malignant neoplasm of cervix: Secondary | ICD-10-CM | POA: Diagnosis not present

## 2021-02-21 DIAGNOSIS — R87613 High grade squamous intraepithelial lesion on cytologic smear of cervix (HGSIL): Secondary | ICD-10-CM | POA: Diagnosis not present

## 2021-02-21 DIAGNOSIS — Z113 Encounter for screening for infections with a predominantly sexual mode of transmission: Secondary | ICD-10-CM | POA: Diagnosis not present

## 2021-02-21 DIAGNOSIS — N938 Other specified abnormal uterine and vaginal bleeding: Secondary | ICD-10-CM | POA: Diagnosis not present

## 2021-02-21 DIAGNOSIS — Z01411 Encounter for gynecological examination (general) (routine) with abnormal findings: Secondary | ICD-10-CM | POA: Diagnosis not present

## 2021-03-13 DIAGNOSIS — G44229 Chronic tension-type headache, not intractable: Secondary | ICD-10-CM | POA: Diagnosis not present

## 2021-03-13 DIAGNOSIS — Z6835 Body mass index (BMI) 35.0-35.9, adult: Secondary | ICD-10-CM | POA: Diagnosis not present

## 2021-03-13 DIAGNOSIS — N3941 Urge incontinence: Secondary | ICD-10-CM | POA: Diagnosis not present

## 2021-03-13 DIAGNOSIS — Z79899 Other long term (current) drug therapy: Secondary | ICD-10-CM | POA: Diagnosis not present

## 2021-03-13 DIAGNOSIS — J309 Allergic rhinitis, unspecified: Secondary | ICD-10-CM | POA: Diagnosis not present

## 2021-04-05 DIAGNOSIS — R87613 High grade squamous intraepithelial lesion on cytologic smear of cervix (HGSIL): Secondary | ICD-10-CM | POA: Diagnosis not present

## 2021-04-05 DIAGNOSIS — N72 Inflammatory disease of cervix uteri: Secondary | ICD-10-CM | POA: Diagnosis not present

## 2021-04-05 DIAGNOSIS — N870 Mild cervical dysplasia: Secondary | ICD-10-CM | POA: Diagnosis not present

## 2021-04-05 DIAGNOSIS — R87810 Cervical high risk human papillomavirus (HPV) DNA test positive: Secondary | ICD-10-CM | POA: Diagnosis not present

## 2021-04-13 DIAGNOSIS — N3941 Urge incontinence: Secondary | ICD-10-CM | POA: Diagnosis not present

## 2021-04-13 DIAGNOSIS — K30 Functional dyspepsia: Secondary | ICD-10-CM | POA: Diagnosis not present

## 2021-04-13 DIAGNOSIS — H9202 Otalgia, left ear: Secondary | ICD-10-CM | POA: Diagnosis not present

## 2021-04-20 DIAGNOSIS — K30 Functional dyspepsia: Secondary | ICD-10-CM | POA: Diagnosis not present

## 2021-04-20 DIAGNOSIS — K8020 Calculus of gallbladder without cholecystitis without obstruction: Secondary | ICD-10-CM | POA: Diagnosis not present

## 2021-05-09 ENCOUNTER — Other Ambulatory Visit: Payer: Self-pay | Admitting: Obstetrics and Gynecology

## 2021-05-09 ENCOUNTER — Encounter: Payer: Self-pay | Admitting: Obstetrics and Gynecology

## 2021-05-09 ENCOUNTER — Telehealth: Payer: Self-pay

## 2021-05-09 ENCOUNTER — Ambulatory Visit (INDEPENDENT_AMBULATORY_CARE_PROVIDER_SITE_OTHER): Payer: 59 | Admitting: Obstetrics and Gynecology

## 2021-05-09 ENCOUNTER — Other Ambulatory Visit: Payer: Self-pay

## 2021-05-09 ENCOUNTER — Other Ambulatory Visit (HOSPITAL_COMMUNITY)
Admission: RE | Admit: 2021-05-09 | Discharge: 2021-05-09 | Disposition: A | Discharge status: Home / Self Care | Payer: 59 | Source: Ambulatory Visit | Attending: Obstetrics and Gynecology | Admitting: Obstetrics and Gynecology

## 2021-05-09 VITALS — BP 107/67 | HR 60 | Ht 61.0 in | Wt 162.1 lb

## 2021-05-09 DIAGNOSIS — N760 Acute vaginitis: Secondary | ICD-10-CM

## 2021-05-09 DIAGNOSIS — Z01419 Encounter for gynecological examination (general) (routine) without abnormal findings: Secondary | ICD-10-CM | POA: Diagnosis not present

## 2021-05-09 MED ORDER — SLYND 4 MG PO TABS: 1.0000 | ORAL_TABLET | Freq: Every day | ORAL | 11 refills | Status: DC

## 2021-05-09 MED ORDER — NORETHINDRONE 0.35 MG PO TABS: 1.0000 | ORAL_TABLET | Freq: Every day | ORAL | 11 refills | Status: DC

## 2021-05-09 NOTE — Telephone Encounter (Signed)
TC from patient her birth control pills will be 207.00 per month. Can you please call in something different. Pharmacy is correct on file.

## 2021-05-09 NOTE — Progress Notes (Signed)
Subjective:     Paula Warner is a 35 y.o. female P2 with BMI 30 and LMP 04/15/21 presenting for a comprehensive physical exam. The patient reports no problems. She is sexually active using BTL for contraception. She reports irregular periods, often having one or two per year, each lasting 5-7 days. She reports some urinary frequency without dysuria and reports a fishy odor. Patient is without any other complaints. She denies pelvic pain or abnormal discharge.   Past Medical History:  Diagnosis Date   Asthma    History of asthma    no current med.   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   History of kidney stones    Menstrual periods irregular    .psh Family History  Problem Relation Age of Onset   Cancer Father    Clotting disorder Father    Heart disease Father    Heart murmur Mother    Asthma Mother    Social History   Tobacco Use   Smoking status: Never   Smokeless tobacco: Never  Vaping Use   Vaping Use: Never used  Substance Use Topics   Alcohol use: No    Alcohol/week: 0.0 standard drinks   Drug use: No     Social History   Socioeconomic History   Marital status: Single    Spouse name: Not on file   Number of children: 2   Years of education: 12   Highest education level: 12th grade  Occupational History   Occupation: Pharmacist, hospital  Tobacco Use   Smoking status: Never   Smokeless tobacco: Never  Vaping Use   Vaping Use: Never used  Substance and Sexual Activity   Alcohol use: No    Alcohol/week: 0.0 standard drinks   Drug use: No   Sexual activity: Yes    Partners: Male    Birth control/protection: Surgical  Other Topics Concern   Not on file  Social History Narrative   Not on file   Social Determinants of Health   Financial Resource Strain: Not on file  Food Insecurity: Not on file  Transportation Needs: Not on file  Physical Activity: Not on file  Stress: Not on file  Social Connections: Not on file  Intimate Partner Violence: Not on  file   Health Maintenance  Topic Date Due   COVID-19 Vaccine (1) Never done   TETANUS/TDAP  Never done   INFLUENZA VACCINE  04/24/2021   PAP SMEAR-Modifier  10/28/2021   Hepatitis C Screening  Completed   HIV Screening  Completed   Pneumococcal Vaccine 8-43 Years old  Aged Out   HPV VACCINES  Aged Out       Review of Systems Pertinent items noted in HPI and remainder of comprehensive ROS otherwise negative.   Objective:  Blood pressure 107/67, pulse 60, height '5\' 1"'$  (1.549 m), weight 162 lb 1.6 oz (73.5 kg), last menstrual period 04/15/2021.    GENERAL: Well-developed, well-nourished female in no acute distress.  HEENT: Normocephalic, atraumatic. Sclerae anicteric.  NECK: Supple. Normal thyroid.  LUNGS: Clear to auscultation bilaterally.  HEART: Regular rate and rhythm. BREASTS: Symmetric in size. No palpable masses or lymphadenopathy, skin changes, or nipple drainage. ABDOMEN: Soft, nontender, nondistended. No organomegaly. PELVIC: Normal external female genitalia. Vagina is pink and rugated.  Normal discharge. Normal appearing cervix. Uterus is normal in size.  No adnexal mass or tenderness. EXTREMITIES: No cyanosis, clubbing, or edema, 2+ distal pulses.    Assessment:    Healthy female exam.  Plan:    Pap smear collected STI screen collected per patient request and evaluation of vaginitis Urine culture collected Patient will be contacted with abnormal results Discussed benefits of hormonal contraception in the long term prevention of endometrial hyperplasia/carcinoma given her oligomenorrhea- Patient agreed to POP See After Visit Summary for Counseling Recommendations

## 2021-05-09 NOTE — Progress Notes (Signed)
Annual Exam Last PAP 10/28/18 WNL Request STI testing, swab and blood work No complaints

## 2021-05-10 LAB — CERVICOVAGINAL ANCILLARY ONLY
Bacterial Vaginitis (gardnerella): POSITIVE — AB
Candida Glabrata: NEGATIVE
Candida Vaginitis: NEGATIVE
Chlamydia: NEGATIVE
Comment: NEGATIVE
Comment: NEGATIVE
Comment: NEGATIVE
Comment: NEGATIVE
Comment: NEGATIVE
Comment: NORMAL
Neisseria Gonorrhea: NEGATIVE
Trichomonas: NEGATIVE

## 2021-05-10 LAB — RPR+HBSAG+HCVAB+...
HIV Screen 4th Generation wRfx: NONREACTIVE
Hep C Virus Ab: <0.1 s/co ratio (ref 0.0–0.9)
Hepatitis B Surface Ag: NEGATIVE
RPR Ser Ql: NONREACTIVE

## 2021-05-10 LAB — CYTOLOGY - PAP
Comment: NEGATIVE
Diagnosis: NEGATIVE
High risk HPV: NEGATIVE

## 2021-05-11 LAB — URINE CULTURE

## 2021-05-11 MED ORDER — METRONIDAZOLE 500 MG PO TABS
500.0000 mg | ORAL_TABLET | Freq: Two times a day (BID) | ORAL | 0 refills | Status: DC
Start: 2021-05-11 — End: 2022-03-22

## 2021-05-11 NOTE — Addendum Note (Signed)
Addended by: Mora Bellman on: 05/11/2021 08:24 AM   Modules accepted: Orders

## 2021-05-16 DIAGNOSIS — M79671 Pain in right foot: Secondary | ICD-10-CM | POA: Diagnosis not present

## 2021-05-16 DIAGNOSIS — L851 Acquired keratosis [keratoderma] palmaris et plantaris: Secondary | ICD-10-CM | POA: Diagnosis not present

## 2021-05-16 DIAGNOSIS — D2122 Benign neoplasm of connective and other soft tissue of left lower limb, including hip: Secondary | ICD-10-CM | POA: Diagnosis not present

## 2021-05-16 DIAGNOSIS — M21622 Bunionette of left foot: Secondary | ICD-10-CM | POA: Diagnosis not present

## 2021-05-16 DIAGNOSIS — M79672 Pain in left foot: Secondary | ICD-10-CM | POA: Diagnosis not present

## 2021-06-12 DIAGNOSIS — K8080 Other cholelithiasis without obstruction: Secondary | ICD-10-CM | POA: Diagnosis not present

## 2021-06-12 DIAGNOSIS — K30 Functional dyspepsia: Secondary | ICD-10-CM | POA: Diagnosis not present

## 2021-06-12 DIAGNOSIS — N3941 Urge incontinence: Secondary | ICD-10-CM | POA: Diagnosis not present

## 2021-06-12 DIAGNOSIS — G44229 Chronic tension-type headache, not intractable: Secondary | ICD-10-CM | POA: Diagnosis not present

## 2021-06-12 DIAGNOSIS — Z6835 Body mass index (BMI) 35.0-35.9, adult: Secondary | ICD-10-CM | POA: Diagnosis not present

## 2021-06-15 DIAGNOSIS — R87613 High grade squamous intraepithelial lesion on cytologic smear of cervix (HGSIL): Secondary | ICD-10-CM | POA: Diagnosis not present

## 2021-06-15 DIAGNOSIS — R102 Pelvic and perineal pain: Secondary | ICD-10-CM | POA: Diagnosis not present

## 2021-06-15 DIAGNOSIS — D219 Benign neoplasm of connective and other soft tissue, unspecified: Secondary | ICD-10-CM | POA: Diagnosis not present

## 2021-06-15 DIAGNOSIS — Z124 Encounter for screening for malignant neoplasm of cervix: Secondary | ICD-10-CM | POA: Diagnosis not present

## 2021-06-19 DIAGNOSIS — R102 Pelvic and perineal pain: Secondary | ICD-10-CM | POA: Diagnosis not present

## 2021-06-19 DIAGNOSIS — Z3042 Encounter for surveillance of injectable contraceptive: Secondary | ICD-10-CM | POA: Diagnosis not present

## 2021-06-19 DIAGNOSIS — Z3202 Encounter for pregnancy test, result negative: Secondary | ICD-10-CM | POA: Diagnosis not present

## 2021-06-19 DIAGNOSIS — D259 Leiomyoma of uterus, unspecified: Secondary | ICD-10-CM | POA: Diagnosis not present

## 2021-06-23 DIAGNOSIS — Z87891 Personal history of nicotine dependence: Secondary | ICD-10-CM | POA: Diagnosis not present

## 2021-06-23 DIAGNOSIS — M7989 Other specified soft tissue disorders: Secondary | ICD-10-CM | POA: Diagnosis not present

## 2021-06-23 DIAGNOSIS — K219 Gastro-esophageal reflux disease without esophagitis: Secondary | ICD-10-CM | POA: Diagnosis not present

## 2021-06-23 DIAGNOSIS — Z09 Encounter for follow-up examination after completed treatment for conditions other than malignant neoplasm: Secondary | ICD-10-CM | POA: Diagnosis not present

## 2021-06-23 DIAGNOSIS — M79672 Pain in left foot: Secondary | ICD-10-CM | POA: Diagnosis not present

## 2021-06-23 DIAGNOSIS — M21622 Bunionette of left foot: Secondary | ICD-10-CM | POA: Diagnosis not present

## 2021-06-23 DIAGNOSIS — Z885 Allergy status to narcotic agent status: Secondary | ICD-10-CM | POA: Diagnosis not present

## 2021-06-23 DIAGNOSIS — Z886 Allergy status to analgesic agent status: Secondary | ICD-10-CM | POA: Diagnosis not present

## 2021-06-23 DIAGNOSIS — Z803 Family history of malignant neoplasm of breast: Secondary | ICD-10-CM | POA: Diagnosis not present

## 2021-06-23 DIAGNOSIS — M21629 Bunionette of unspecified foot: Secondary | ICD-10-CM | POA: Diagnosis not present

## 2021-06-23 DIAGNOSIS — Z793 Long term (current) use of hormonal contraceptives: Secondary | ICD-10-CM | POA: Diagnosis not present

## 2021-06-23 DIAGNOSIS — Z79899 Other long term (current) drug therapy: Secondary | ICD-10-CM | POA: Diagnosis not present

## 2021-06-23 DIAGNOSIS — Z888 Allergy status to other drugs, medicaments and biological substances status: Secondary | ICD-10-CM | POA: Diagnosis not present

## 2021-06-28 ENCOUNTER — Emergency Department (HOSPITAL_COMMUNITY)
Admission: EM | Admit: 2021-06-28 | Discharge: 2021-06-28 | Disposition: A | Discharge status: Home / Self Care | Payer: 59 | Attending: Emergency Medicine | Admitting: Emergency Medicine

## 2021-06-28 ENCOUNTER — Other Ambulatory Visit: Payer: Self-pay

## 2021-06-28 ENCOUNTER — Encounter (HOSPITAL_COMMUNITY): Payer: Self-pay | Admitting: Oncology

## 2021-06-28 ENCOUNTER — Emergency Department (HOSPITAL_COMMUNITY): Payer: 59

## 2021-06-28 DIAGNOSIS — R1084 Generalized abdominal pain: Secondary | ICD-10-CM | POA: Insufficient documentation

## 2021-06-28 DIAGNOSIS — J45909 Unspecified asthma, uncomplicated: Secondary | ICD-10-CM | POA: Diagnosis not present

## 2021-06-28 DIAGNOSIS — N9489 Other specified conditions associated with female genital organs and menstrual cycle: Secondary | ICD-10-CM | POA: Diagnosis not present

## 2021-06-28 DIAGNOSIS — R1030 Lower abdominal pain, unspecified: Secondary | ICD-10-CM

## 2021-06-28 LAB — COMPREHENSIVE METABOLIC PANEL
ALT: 136 U/L — ABNORMAL HIGH (ref 0–44)
AST: 97 U/L — ABNORMAL HIGH (ref 15–41)
Albumin: 4.3 g/dL (ref 3.5–5.0)
Alkaline Phosphatase: 97 U/L (ref 38–126)
Anion gap: 9 (ref 5–15)
BUN: 11 mg/dL (ref 6–20)
CO2: 31 mmol/L (ref 22–32)
Calcium: 9.5 mg/dL (ref 8.9–10.3)
Chloride: 109 mmol/L (ref 98–111)
Creatinine, Ser: 0.64 mg/dL (ref 0.44–1.00)
GFR, Estimated: >60 mL/min (ref >60)
Glucose, Bld: 85 mg/dL (ref 70–99)
Potassium: 3.5 mmol/L (ref 3.5–5.1)
Sodium: 149 mmol/L — ABNORMAL HIGH (ref 135–145)
Total Bilirubin: 2.5 mg/dL — ABNORMAL HIGH (ref 0.3–1.2)
Total Protein: 8.2 g/dL — ABNORMAL HIGH (ref 6.5–8.1)

## 2021-06-28 LAB — CBC
HCT: 42 % (ref 36.0–46.0)
Hemoglobin: 14.1 g/dL (ref 12.0–15.0)
MCH: 32.4 pg (ref 26.0–34.0)
MCHC: 33.6 g/dL (ref 30.0–36.0)
MCV: 96.6 fL (ref 80.0–100.0)
Platelets: 233 10*3/uL (ref 150–400)
RBC: 4.35 MIL/uL (ref 3.87–5.11)
RDW: 12.6 % (ref 11.5–15.5)
WBC: 9 10*3/uL (ref 4.0–10.5)
nRBC: 0 % (ref 0.0–0.2)

## 2021-06-28 LAB — URINALYSIS, ROUTINE W REFLEX MICROSCOPIC
Bilirubin Urine: NEGATIVE
Glucose, UA: NEGATIVE mg/dL
Hgb urine dipstick: NEGATIVE
Ketones, ur: NEGATIVE mg/dL
Leukocytes,Ua: NEGATIVE
Nitrite: NEGATIVE
Protein, ur: NEGATIVE mg/dL
Specific Gravity, Urine: 1.023 (ref 1.005–1.030)
pH: 6 (ref 5.0–8.0)

## 2021-06-28 LAB — I-STAT BETA HCG BLOOD, ED (MC, WL, AP ONLY): I-stat hCG, quantitative: <5 m[IU]/mL (ref <5)

## 2021-06-28 LAB — LIPASE, BLOOD: Lipase: 31 U/L (ref 11–51)

## 2021-06-28 MED ORDER — SODIUM CHLORIDE 0.9 % IV BOLUS
500.0000 mL | Freq: Once | INTRAVENOUS | Status: AC
  Administered 2021-06-28: 500 mL via INTRAVENOUS

## 2021-06-28 MED ORDER — ONDANSETRON HCL 4 MG/2ML IJ SOLN
4.0000 mg | Freq: Once | INTRAMUSCULAR | Status: AC
  Administered 2021-06-28: 4 mg via INTRAVENOUS
  Filled 2021-06-28: qty 2

## 2021-06-28 MED ORDER — ONDANSETRON HCL 4 MG PO TABS: 4.0000 mg | ORAL_TABLET | Freq: Four times a day (QID) | ORAL | 0 refills | Status: DC

## 2021-06-28 MED ORDER — HYDROMORPHONE HCL 1 MG/ML IJ SOLN
0.5000 mg | Freq: Once | INTRAMUSCULAR | Status: AC
Start: 2021-06-28 — End: 2021-06-28
  Administered 2021-06-28: 0.5 mg via INTRAVENOUS
  Filled 2021-06-28: qty 1

## 2021-06-28 NOTE — ED Triage Notes (Signed)
Pt c/o lower abdominal pain, nausea, 1 episode of emesis x 2 days.  Pt reports feeling as if she is not completely emptying bladder.

## 2021-06-28 NOTE — ED Provider Notes (Signed)
[place] DEPT Provider Note   CSN: [id number] Arrival date & time: 06/28/21  1048     History Chief Complaint  Patient presents with   Abdominal Pain    Paula Warner is a 35 y.o. female.  Patient complains of vomiting abdominal discomfort.  No fevers no chills.  The history is provided by the patient and medical records. No language interpreter was used.  Abdominal Pain Pain location:  Generalized Pain quality: aching   Pain radiates to:  Does not radiate Pain severity:  Mild Onset quality:  Sudden Timing:  Intermittent Progression:  Waxing and waning Chronicity:  New Associated symptoms: no chest pain, no cough, no diarrhea, no fatigue and no hematuria       Past Medical History:  Diagnosis Date   Asthma    History of asthma    no current med.   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   DVT (deep venous thrombosis) ([place]) 07/11/2016   DVT of lower extremity (deep venous thrombosis) ([place]) 01/24/2016   High risk sexual behavior 08/04/2015    Past Surgical History:  Procedure Laterality Date   CESAREAN SECTION     x 2   CHOLECYSTECTOMY N/A 02/05/2020   Procedure: LAPAROSCOPIC CHOLECYSTECTOMY WITH POSSIBLE INTRAOPERATIVE CHOLANGIOGRAM;  Surgeon: Michael Boston, MD;  Location: [facility];  Service: General;  Laterality: N/A;   LAPAROSCOPIC TUBAL LIGATION Bilateral 05/08/2017   Procedure: LAPAROSCOPIC TUBAL LIGATION - FILSHIE CLIPS;  Surgeon: Emily Filbert, MD;  Location: [facility];  Service: Gynecology;  Laterality: Bilateral;   LOWER EXTREMITY ANGIOGRAM Left 07/11/2016   Procedure: Percutaneous Venous Thrombectomy with IVUS and  with Stent Placement;  Surgeon: Serafina Mitchell, MD;  Location:  [facility];  Service: Vascular;  Laterality: Left;     OB History     Gravida  3   Para  1   Term      Preterm      AB  1   Living  2      SAB      IAB  1   Ectopic      Multiple  0   Live Births  2           Family History  Problem Relation Age of Onset   Cancer Father    Clotting disorder Father    Heart disease Father    Heart murmur Mother    Asthma Mother     Social History   Tobacco Use   Smoking status: Never   Smokeless tobacco: Never  Vaping Use   Vaping Use: Never used  Substance Use Topics   Alcohol use: No    Alcohol/week: 0.0 standard drinks   Drug use: No    Home Medications Prior to Admission medications   Medication Sig Start Date End Date Taking? Authorizing Provider  ondansetron (ZOFRAN) 4 MG tablet Take 1 tablet (4 mg total) by mouth every 6 (six) hours. 06/28/21  Yes Milton Ferguson, MD  acetaminophen (TYLENOL) 325 MG tablet Take 2 tablets (650 mg total) by mouth every 6 (six) hours as needed (pain). 02/06/20   Florencia Reasons, MD  bismuth subsalicylate (PEPTO BISMOL) 262 MG/15ML suspension Take 30 mLs by mouth every 6 (six)  hours as needed for indigestion or diarrhea or loose stools.     [provider]  cholecalciferol (VITAMIN D3) 25 MCG (1000 UNIT) tablet Take 1,000 Units by mouth daily.    [provider]  Drospirenone (SLYND) 4 MG TABS Take 1 tablet by mouth daily. 05/09/21   Constant, Peggy, MD  HYDROcodone-acetaminophen (NORCO) 5-325 MG tablet Take 1 tablet by mouth every 4 (four) hours as needed for severe pain. 09/15/20   Sherwood Gambler, MD  ibuprofen (ADVIL) 400 MG tablet Take 1 tablet (400 mg total) by mouth every 6 (six) hours as needed. Patient not taking: Reported on 05/09/2021 09/15/20   Sherwood Gambler, MD  medroxyPROGESTERone (PROVERA) 10 MG tablet Take 1 tablet (10 mg total) by mouth daily. Patient not taking: Reported on 05/09/2021 04/11/20   Leftwich-Kirby, Kathie Dike, CNM  metroNIDAZOLE (FLAGYL) 500 MG tablet  Take 1 tablet (500 mg total) by mouth 2 (two) times daily. 05/11/21   Constant, Peggy, MD  norethindrone (MICRONOR) 0.35 MG tablet Take 1 tablet (0.35 mg total) by mouth daily. 05/09/21   Constant, Peggy, MD  ondansetron (ZOFRAN ODT) 4 MG disintegrating tablet Take 1 tablet (4 mg total) by mouth every 8 (eight) hours as needed for nausea or vomiting. 09/15/20   Sherwood Gambler, MD  predniSONE (DELTASONE) 20 MG tablet Take 20 mg by mouth daily. 05/25/20   [provider]  promethazine (PHENERGAN) 25 MG suppository Place 1 suppository (25 mg total) rectally every 8 (eight) hours as needed for nausea or vomiting. 09/17/20   Lorin Glass, PA-C  tamsulosin (FLOMAX) 0.4 MG CAPS capsule Take 1 capsule (0.4 mg total) by mouth daily as needed (Kidney stone pain). 09/17/20   Lorin Glass, PA-C  terbinafine (LAMISIL) 250 MG tablet Take 1 tablet (250 mg total) by mouth daily. 06/28/20   Trula Slade, DPM    Allergies    Zinacef [cefuroxime]  Review of Systems   Review of Systems  Constitutional:  Negative for appetite change and fatigue.  HENT:  Negative for congestion, ear discharge and sinus pressure.   Eyes:  Negative for discharge.  Respiratory:  Negative for cough.   Cardiovascular:  Negative for chest pain.  Gastrointestinal:  Positive for abdominal pain. Negative for diarrhea.  Genitourinary:  Negative for frequency and hematuria.  Musculoskeletal:  Negative for back pain.  Skin:  Negative for rash.  Neurological:  Negative for seizures and headaches.  Psychiatric/Behavioral:  Negative for hallucinations.    Physical Exam Updated Vital Signs BP 115/81 (BP Location: Left Arm)   Pulse 91   Temp 98.4 F (36.9 C) (Oral)   Resp 16   Ht '5\' 1"'$  (1.549 m)   Wt 72.6 kg   LMP 06/09/2021 (Exact Date) Comment: negative HCG 06-28-2021  SpO2 96%   BMI 30.23 kg/m   Physical Exam Vitals and nursing note reviewed.  Constitutional:      Appearance: She is well-developed.   HENT:     Head: Normocephalic.  Eyes:     General: No scleral icterus.    Conjunctiva/sclera: Conjunctivae normal.  Neck:     Thyroid: No thyromegaly.  Cardiovascular:     Rate and Rhythm: Normal rate and regular rhythm.     Heart sounds: No murmur heard.   No friction rub. No gallop.  Pulmonary:     Breath sounds: No stridor. No wheezing or rales.  Chest:     Chest wall: No tenderness.  Abdominal:     General: There is no  distension.     Tenderness: There is no abdominal tenderness. There is no rebound.  Musculoskeletal:        General: Normal range of motion.     Cervical back: Neck supple.  Lymphadenopathy:     Cervical: No cervical adenopathy.  Skin:    Findings: No erythema or rash.  Neurological:     Mental Status: She is alert and oriented to person, place, and time.     Motor: No abnormal muscle tone.     Coordination: Coordination normal.  Psychiatric:        Behavior: Behavior normal.    ED Results / Procedures / Treatments   Labs (all labs ordered are listed, but only abnormal results are displayed) Labs Reviewed  COMPREHENSIVE METABOLIC PANEL - Abnormal; Notable for the following components:      Result Value   Sodium 149 (*)    Total Protein 8.2 (*)    AST 97 (*)    ALT 136 (*)    Total Bilirubin 2.5 (*)    All other components within normal limits  URINALYSIS, ROUTINE W REFLEX MICROSCOPIC - Abnormal; Notable for the following components:   APPearance HAZY (*)    All other components within normal limits  LIPASE, BLOOD  CBC  I-STAT BETA HCG BLOOD, ED ([facility], [facility], [facility] ONLY)    EKG None  Radiology CT Renal Stone Study  Result Date: 06/28/2021 CLINICAL DATA:  Flank pain, kidney stone suspected. Low abdominal pain with nausea and vomiting for 2 days. EXAM: CT ABDOMEN AND PELVIS WITHOUT CONTRAST TECHNIQUE: Multidetector CT imaging of the abdomen and pelvis was performed following the standard protocol without IV contrast. COMPARISON:  Abdominopelvic CT  09/17/2020. FINDINGS: Lower chest: Interval improved aeration of both lung bases with mild residual dependent atelectasis. No significant pleural or pericardial effusion. Hepatobiliary: No focal hepatic abnormalities are identified. There is no significant biliary dilatation status post cholecystectomy. Pancreas: Unremarkable. No pancreatic ductal dilatation or surrounding inflammatory changes. Spleen: Normal in size without focal abnormality. Adrenals/Urinary Tract: Both adrenal glands appear normal. Numerous small nonobstructing renal calculi are again noted bilaterally. There is no residual hydronephrosis or perinephric soft tissue stranding. The previously demonstrated distal right ureteral calculus has passed. The bladder appears unremarkable. Stomach/Bowel: No enteric contrast administered the stomach appears unremarkable for its degree of distension. No evidence of bowel wall thickening, distention or surrounding inflammatory change. The appendix appears normal. Vascular/Lymphatic: There are no enlarged abdominal or pelvic lymph nodes. No acute vascular findings on noncontrast imaging. There is a metallic stent within the left common and external iliac vein. Stable venous collaterals in the lower anterior pelvic wall and pubic area. Reproductive: The uterus and ovaries appear unremarkable. No adnexal mass. Bilateral tubal ligation clips are noted. Other: Postsurgical changes at the umbilicus. No significant abdominal wall hernia, ascites or free air. Musculoskeletal: No acute or significant osseous findings. IMPRESSION: 1. No acute findings or explanation for the patient's symptoms. 2. Previously demonstrated distal right ureteral calculus has passed. No recurrent ureteral calculus or hydronephrosis. Multiple small nonobstructing renal calculi are present bilaterally. 3. Unchanged position of left iliac venous stent. Patency not addressed without contrast. Electronically Signed   By: Richardean Sale M.D.    On: 06/28/2021 14:15    Procedures Procedures   Medications Ordered in ED Medications  HYDROmorphone (DILAUDID) injection 0.5 mg (0.5 mg Intravenous Given 06/28/21 1314)  ondansetron (ZOFRAN) injection 4 mg (4 mg Intravenous Given 06/28/21 1314)  sodium chloride 0.9 % bolus 500 mL (  500 mLs Intravenous Bolus 06/28/21 1313)    ED Course  I have reviewed the triage vital signs and the nursing notes.  Pertinent labs & imaging results that were available during my care of the patient were reviewed by me and considered in my medical decision making (see chart for details).    MDM Rules/Calculators/A&P                           Labs including urine are unremarkable except for mildly elevated sodium.  CT scan negative.  Patient improved with fluids and nausea medicine.  She will follow-up with her PCP and is given some Zofran and told to drink plenty of fluids Final Clinical Impression(s) / ED Diagnoses Final diagnoses:  Lower abdominal pain    Rx / DC Orders ED Discharge Orders          Ordered    ondansetron (ZOFRAN) 4 MG tablet  Every 6 hours        06/28/21 1530             Milton Ferguson, MD 07/01/21 1202

## 2021-06-28 NOTE — Discharge Instructions (Addendum)
Drink plenty of fluids and follow-up with your primary care doctor in 2 days for recheck.  Your salt level was slightly high and needs to be checked again

## 2021-06-28 NOTE — ED Notes (Signed)
Pt ambulatory without assistance in triage.

## 2021-06-30 DIAGNOSIS — M779 Enthesopathy, unspecified: Secondary | ICD-10-CM | POA: Diagnosis not present

## 2021-06-30 DIAGNOSIS — M79672 Pain in left foot: Secondary | ICD-10-CM | POA: Diagnosis not present

## 2021-06-30 DIAGNOSIS — M21622 Bunionette of left foot: Secondary | ICD-10-CM | POA: Diagnosis not present

## 2021-07-03 DIAGNOSIS — K8020 Calculus of gallbladder without cholecystitis without obstruction: Secondary | ICD-10-CM | POA: Diagnosis not present

## 2021-07-05 DIAGNOSIS — Z79899 Other long term (current) drug therapy: Secondary | ICD-10-CM | POA: Diagnosis not present

## 2021-07-05 DIAGNOSIS — G8918 Other acute postprocedural pain: Secondary | ICD-10-CM | POA: Diagnosis not present

## 2021-07-05 DIAGNOSIS — Z886 Allergy status to analgesic agent status: Secondary | ICD-10-CM | POA: Diagnosis not present

## 2021-07-05 DIAGNOSIS — K8020 Calculus of gallbladder without cholecystitis without obstruction: Secondary | ICD-10-CM | POA: Diagnosis not present

## 2021-07-05 DIAGNOSIS — K8010 Calculus of gallbladder with chronic cholecystitis without obstruction: Secondary | ICD-10-CM | POA: Diagnosis not present

## 2021-07-05 DIAGNOSIS — Z87891 Personal history of nicotine dependence: Secondary | ICD-10-CM | POA: Diagnosis not present

## 2021-07-05 DIAGNOSIS — K219 Gastro-esophageal reflux disease without esophagitis: Secondary | ICD-10-CM | POA: Diagnosis not present

## 2021-07-05 DIAGNOSIS — Z888 Allergy status to other drugs, medicaments and biological substances status: Secondary | ICD-10-CM | POA: Diagnosis not present

## 2021-07-05 DIAGNOSIS — Z803 Family history of malignant neoplasm of breast: Secondary | ICD-10-CM | POA: Diagnosis not present

## 2021-07-05 DIAGNOSIS — Z885 Allergy status to narcotic agent status: Secondary | ICD-10-CM | POA: Diagnosis not present

## 2021-07-07 DIAGNOSIS — M21622 Bunionette of left foot: Secondary | ICD-10-CM | POA: Diagnosis not present

## 2021-07-07 DIAGNOSIS — M779 Enthesopathy, unspecified: Secondary | ICD-10-CM | POA: Diagnosis not present

## 2021-07-07 DIAGNOSIS — Z09 Encounter for follow-up examination after completed treatment for conditions other than malignant neoplasm: Secondary | ICD-10-CM | POA: Diagnosis not present

## 2021-07-14 DIAGNOSIS — F32A Depression, unspecified: Secondary | ICD-10-CM | POA: Diagnosis not present

## 2021-07-14 DIAGNOSIS — Z79899 Other long term (current) drug therapy: Secondary | ICD-10-CM | POA: Diagnosis not present

## 2021-07-14 DIAGNOSIS — G4700 Insomnia, unspecified: Secondary | ICD-10-CM | POA: Diagnosis not present

## 2021-07-17 DIAGNOSIS — F4322 Adjustment disorder with anxiety: Secondary | ICD-10-CM | POA: Diagnosis not present

## 2021-07-19 DIAGNOSIS — F4322 Adjustment disorder with anxiety: Secondary | ICD-10-CM | POA: Diagnosis not present

## 2021-07-21 DIAGNOSIS — M21622 Bunionette of left foot: Secondary | ICD-10-CM | POA: Diagnosis not present

## 2021-07-21 DIAGNOSIS — Z09 Encounter for follow-up examination after completed treatment for conditions other than malignant neoplasm: Secondary | ICD-10-CM | POA: Diagnosis not present

## 2021-07-25 DIAGNOSIS — F4322 Adjustment disorder with anxiety: Secondary | ICD-10-CM | POA: Diagnosis not present

## 2021-07-27 DIAGNOSIS — F4322 Adjustment disorder with anxiety: Secondary | ICD-10-CM | POA: Diagnosis not present

## 2021-07-31 DIAGNOSIS — Z6832 Body mass index (BMI) 32.0-32.9, adult: Secondary | ICD-10-CM | POA: Diagnosis not present

## 2021-07-31 DIAGNOSIS — G4700 Insomnia, unspecified: Secondary | ICD-10-CM | POA: Diagnosis not present

## 2021-07-31 DIAGNOSIS — Z79899 Other long term (current) drug therapy: Secondary | ICD-10-CM | POA: Diagnosis not present

## 2021-07-31 DIAGNOSIS — R45851 Suicidal ideations: Secondary | ICD-10-CM | POA: Diagnosis not present

## 2021-07-31 DIAGNOSIS — F32A Depression, unspecified: Secondary | ICD-10-CM | POA: Diagnosis not present

## 2021-08-01 DIAGNOSIS — F4322 Adjustment disorder with anxiety: Secondary | ICD-10-CM | POA: Diagnosis not present

## 2021-08-03 DIAGNOSIS — F4312 Post-traumatic stress disorder, chronic: Secondary | ICD-10-CM | POA: Diagnosis not present

## 2021-08-04 DIAGNOSIS — M21622 Bunionette of left foot: Secondary | ICD-10-CM | POA: Diagnosis not present

## 2021-08-04 DIAGNOSIS — M779 Enthesopathy, unspecified: Secondary | ICD-10-CM | POA: Diagnosis not present

## 2021-08-04 DIAGNOSIS — Z09 Encounter for follow-up examination after completed treatment for conditions other than malignant neoplasm: Secondary | ICD-10-CM | POA: Diagnosis not present

## 2021-08-07 DIAGNOSIS — F4312 Post-traumatic stress disorder, chronic: Secondary | ICD-10-CM | POA: Diagnosis not present

## 2021-08-10 DIAGNOSIS — F4312 Post-traumatic stress disorder, chronic: Secondary | ICD-10-CM | POA: Diagnosis not present

## 2021-08-14 DIAGNOSIS — F32A Depression, unspecified: Secondary | ICD-10-CM | POA: Diagnosis not present

## 2021-08-14 DIAGNOSIS — Z6833 Body mass index (BMI) 33.0-33.9, adult: Secondary | ICD-10-CM | POA: Diagnosis not present

## 2021-08-14 DIAGNOSIS — F4312 Post-traumatic stress disorder, chronic: Secondary | ICD-10-CM | POA: Diagnosis not present

## 2021-08-14 DIAGNOSIS — F419 Anxiety disorder, unspecified: Secondary | ICD-10-CM | POA: Diagnosis not present

## 2021-08-14 DIAGNOSIS — Z79899 Other long term (current) drug therapy: Secondary | ICD-10-CM | POA: Diagnosis not present

## 2021-08-15 DIAGNOSIS — R102 Pelvic and perineal pain: Secondary | ICD-10-CM | POA: Diagnosis not present

## 2021-08-15 DIAGNOSIS — N938 Other specified abnormal uterine and vaginal bleeding: Secondary | ICD-10-CM | POA: Diagnosis not present

## 2021-08-15 DIAGNOSIS — D219 Benign neoplasm of connective and other soft tissue, unspecified: Secondary | ICD-10-CM | POA: Diagnosis not present

## 2021-08-15 DIAGNOSIS — R4586 Emotional lability: Secondary | ICD-10-CM | POA: Diagnosis not present

## 2021-08-16 DIAGNOSIS — F4312 Post-traumatic stress disorder, chronic: Secondary | ICD-10-CM | POA: Diagnosis not present

## 2021-08-17 DIAGNOSIS — J069 Acute upper respiratory infection, unspecified: Secondary | ICD-10-CM | POA: Diagnosis not present

## 2021-08-23 DIAGNOSIS — F4312 Post-traumatic stress disorder, chronic: Secondary | ICD-10-CM | POA: Diagnosis not present

## 2021-08-24 DIAGNOSIS — M21622 Bunionette of left foot: Secondary | ICD-10-CM | POA: Diagnosis not present

## 2021-08-24 DIAGNOSIS — M21621 Bunionette of right foot: Secondary | ICD-10-CM | POA: Diagnosis not present

## 2021-08-24 DIAGNOSIS — Z09 Encounter for follow-up examination after completed treatment for conditions other than malignant neoplasm: Secondary | ICD-10-CM | POA: Diagnosis not present

## 2021-08-30 DIAGNOSIS — F4312 Post-traumatic stress disorder, chronic: Secondary | ICD-10-CM | POA: Diagnosis not present

## 2021-09-01 DIAGNOSIS — F4312 Post-traumatic stress disorder, chronic: Secondary | ICD-10-CM | POA: Diagnosis not present

## 2021-09-06 DIAGNOSIS — F4312 Post-traumatic stress disorder, chronic: Secondary | ICD-10-CM | POA: Diagnosis not present

## 2021-09-11 DIAGNOSIS — F4312 Post-traumatic stress disorder, chronic: Secondary | ICD-10-CM | POA: Diagnosis not present

## 2021-09-12 DIAGNOSIS — Z3042 Encounter for surveillance of injectable contraceptive: Secondary | ICD-10-CM | POA: Diagnosis not present

## 2021-09-13 DIAGNOSIS — F4312 Post-traumatic stress disorder, chronic: Secondary | ICD-10-CM | POA: Diagnosis not present

## 2021-09-21 DIAGNOSIS — M779 Enthesopathy, unspecified: Secondary | ICD-10-CM | POA: Diagnosis not present

## 2021-09-21 DIAGNOSIS — M21622 Bunionette of left foot: Secondary | ICD-10-CM | POA: Diagnosis not present

## 2021-09-21 DIAGNOSIS — R600 Localized edema: Secondary | ICD-10-CM | POA: Diagnosis not present

## 2021-09-21 DIAGNOSIS — M79672 Pain in left foot: Secondary | ICD-10-CM | POA: Diagnosis not present

## 2021-09-22 DIAGNOSIS — F4312 Post-traumatic stress disorder, chronic: Secondary | ICD-10-CM | POA: Diagnosis not present

## 2021-10-05 DIAGNOSIS — Z309 Encounter for contraceptive management, unspecified: Secondary | ICD-10-CM | POA: Diagnosis not present

## 2021-10-05 DIAGNOSIS — R87613 High grade squamous intraepithelial lesion on cytologic smear of cervix (HGSIL): Secondary | ICD-10-CM | POA: Diagnosis not present

## 2021-10-05 DIAGNOSIS — Z124 Encounter for screening for malignant neoplasm of cervix: Secondary | ICD-10-CM | POA: Diagnosis not present

## 2021-10-19 DIAGNOSIS — Z6837 Body mass index (BMI) 37.0-37.9, adult: Secondary | ICD-10-CM | POA: Diagnosis not present

## 2021-10-19 DIAGNOSIS — J3081 Allergic rhinitis due to animal (cat) (dog) hair and dander: Secondary | ICD-10-CM | POA: Diagnosis not present

## 2021-10-24 DIAGNOSIS — F419 Anxiety disorder, unspecified: Secondary | ICD-10-CM | POA: Diagnosis not present

## 2021-10-24 DIAGNOSIS — Z6836 Body mass index (BMI) 36.0-36.9, adult: Secondary | ICD-10-CM | POA: Diagnosis not present

## 2021-10-24 DIAGNOSIS — F32A Depression, unspecified: Secondary | ICD-10-CM | POA: Diagnosis not present

## 2021-10-24 DIAGNOSIS — Z79899 Other long term (current) drug therapy: Secondary | ICD-10-CM | POA: Diagnosis not present

## 2021-11-21 ENCOUNTER — Emergency Department (HOSPITAL_COMMUNITY)
Admission: EM | Admit: 2021-11-21 | Discharge: 2021-11-21 | Disposition: A | Discharge status: Home / Self Care | Payer: Self-pay | Attending: Emergency Medicine | Admitting: Emergency Medicine

## 2021-11-21 ENCOUNTER — Encounter (HOSPITAL_COMMUNITY): Payer: Self-pay

## 2021-11-21 ENCOUNTER — Emergency Department (HOSPITAL_BASED_OUTPATIENT_CLINIC_OR_DEPARTMENT_OTHER)
Admission: EM | Admit: 2021-11-21 | Discharge: 2021-11-21 | Disposition: A | Discharge status: Home / Self Care | Payer: Medicaid Other | Source: Home / Self Care

## 2021-11-21 DIAGNOSIS — M79604 Pain in right leg: Secondary | ICD-10-CM | POA: Insufficient documentation

## 2021-11-21 DIAGNOSIS — M79605 Pain in left leg: Secondary | ICD-10-CM | POA: Insufficient documentation

## 2021-11-21 NOTE — ED Provider Notes (Signed)
[facility] DEPT Provider Note   CSN: [id number] Arrival date & time: 11/21/21  0758     History  Chief Complaint  Patient presents with   Leg Pain    Paula Warner is a 36 y.o. female.   Leg Pain  Patient with history of DVT, May Thurner not currently on anticoagulation presents due to bilateral lower extremity pain.  Started a week and a half ago, its intermittent.  Feels like a burning pain, no associated redness or swelling that she is noted.  Denies any trauma.  No chest pain or shortness of breath.  Home Medications Prior to Admission medications   Medication Sig Start Date End Date Taking? Authorizing Provider  acetaminophen (TYLENOL) 325 MG tablet Take 2 tablets (650 mg total) by mouth every 6 (six) hours as needed (pain). 02/06/20   Florencia Reasons, MD  bismuth subsalicylate (PEPTO BISMOL) 262 MG/15ML suspension Take 30 mLs by mouth every 6 (six) hours as needed for indigestion or diarrhea or loose stools.     [provider]  cholecalciferol (VITAMIN D3) 25 MCG (1000 UNIT) tablet Take 1,000 Units by mouth daily.    [provider]  Drospirenone (SLYND) 4 MG TABS Take 1 tablet by mouth daily. 05/09/21   Constant, Peggy, MD  HYDROcodone-acetaminophen (NORCO) 5-325 MG tablet Take 1 tablet by mouth every 4 (four) hours as needed for severe pain. 09/15/20   Sherwood Gambler, MD  ibuprofen (ADVIL) 400 MG tablet Take 1 tablet (400 mg total) by mouth every 6 (six) hours as needed. Patient not taking: Reported on 05/09/2021 09/15/20   Sherwood Gambler, MD  medroxyPROGESTERone (PROVERA) 10 MG tablet Take 1 tablet (10 mg total) by mouth daily. Patient not taking: Reported on 05/09/2021 04/11/20   Leftwich-Kirby, Kathie Dike, CNM  metroNIDAZOLE (FLAGYL) 500 MG tablet Take 1 tablet (500 mg total) by mouth 2 (two) times daily. 05/11/21   Constant, Peggy, MD  norethindrone (MICRONOR) 0.35 MG tablet Take 1 tablet (0.35 mg total) by mouth daily. 05/09/21    Constant, Peggy, MD  ondansetron (ZOFRAN ODT) 4 MG disintegrating tablet Take 1 tablet (4 mg total) by mouth every 8 (eight) hours as needed for nausea or vomiting. 09/15/20   Sherwood Gambler, MD  ondansetron (ZOFRAN) 4 MG tablet Take 1 tablet (4 mg total) by mouth every 6 (six) hours. 06/28/21   Milton Ferguson, MD  predniSONE (DELTASONE) 20 MG tablet Take 20 mg by mouth daily. 05/25/20   [provider]  promethazine (PHENERGAN) 25 MG suppository Place 1 suppository (25 mg total) rectally every 8 (eight) hours as needed for nausea or vomiting. 09/17/20   Lorin Glass, PA-C  tamsulosin (FLOMAX) 0.4 MG CAPS capsule Take 1 capsule (0.4 mg total) by mouth daily as needed (Kidney stone pain). 09/17/20   Lorin Glass, PA-C  terbinafine (LAMISIL) 250 MG tablet Take 1 tablet (250 mg total) by mouth daily. 06/28/20   Trula Slade, DPM      Allergies    Zinacef [cefuroxime]    Review of Systems   Review of Systems  Physical Exam Updated Vital Signs BP 110/77 (BP Location: Right Arm)    Pulse 73    Temp 98.1 F (36.7 C) (Oral)    Resp 18    LMP 11/12/2021 (Approximate)    SpO2 97%  Physical Exam Vitals and nursing note reviewed. Exam conducted with a chaperone present.  Constitutional:      General: She is not in acute distress.  Appearance: Normal appearance.  HENT:     Head: Normocephalic and atraumatic.  Eyes:     General: No scleral icterus.    Extraocular Movements: Extraocular movements intact.     Pupils: Pupils are equal, round, and reactive to light.  Cardiovascular:     Rate and Rhythm: Normal rate.     Pulses: Normal pulses.  Musculoskeletal:        General: No swelling or tenderness. Normal range of motion.     Right lower leg: No edema.     Left lower leg: No edema.  Skin:    Capillary Refill: Capillary refill takes less than 2 seconds.     Coloration: Skin is not jaundiced.  Neurological:     Mental Status: She is alert. Mental status is at  baseline.     Coordination: Coordination normal.    ED Results / Procedures / Treatments   Labs (all labs ordered are listed, but only abnormal results are displayed) Labs Reviewed - No data to display  EKG None  Radiology No results found.  Procedures Procedures    Medications Ordered in ED Medications - No data to display  ED Course/ Medical Decision Making/ A&P                           Medical Decision Making Amount and/or Complexity of Data Reviewed ECG/medicine tests: ordered.   36 year old female presenting with bilateral lower extremity pain.  DVT was obviously considered given history, ultrasound ordered was negative.  There is no surrounding skin discoloration suspicious for cellulitis.  Strong pulses, neurovascularly intact.  No edema.  Good ROM.  Doubt any specific emergent pathology, suspect possible myalgias.  Will have patient follow-up with PCP.        Final Clinical Impression(s) / ED Diagnoses Final diagnoses:  Bilateral leg pain    Rx / DC Orders ED Discharge Orders     None         Sherrill Raring, PA-C 11/21/21 0920    Godfrey Pick, MD 11/22/21 [phone]

## 2021-11-21 NOTE — ED Notes (Signed)
Vascular study in progress, will collect labs as soon as that is complete.

## 2021-11-21 NOTE — ED Notes (Signed)
Pt met this RN in hallway prior to this RN being able to get DC vitals and have patient sign. Discharge instructions reviewed with patient.

## 2021-11-21 NOTE — Progress Notes (Signed)
Bilateral lower extremity venous duplex has been completed. Preliminary results can be found in CV Proc through chart review.  Results were given to [facility] PA.  11/21/21 9:16 AM Carlos Levering RVT

## 2021-11-21 NOTE — Discharge Instructions (Signed)
Take Tylenol and Motrin as needed for pain.  Follow-up with your primary if this pain persist.  Scan was negative for blood clots.

## 2021-11-21 NOTE — ED Triage Notes (Signed)
Pt presents with c/o leg pain. Pt reports the pain has been present for approx a week and a half and is in between her thighs. Pt reports hx of blood clots when she has felt this type of pain before.

## 2021-12-05 DIAGNOSIS — Z3042 Encounter for surveillance of injectable contraceptive: Secondary | ICD-10-CM | POA: Diagnosis not present

## 2021-12-13 ENCOUNTER — Emergency Department (HOSPITAL_COMMUNITY)
Admission: EM | Admit: 2021-12-13 | Discharge: 2021-12-13 | Disposition: A | Discharge status: Home / Self Care | Payer: Self-pay | Attending: Emergency Medicine | Admitting: Emergency Medicine

## 2021-12-13 ENCOUNTER — Encounter (HOSPITAL_COMMUNITY): Payer: Self-pay

## 2021-12-13 ENCOUNTER — Other Ambulatory Visit: Payer: Self-pay

## 2021-12-13 ENCOUNTER — Emergency Department (HOSPITAL_COMMUNITY): Payer: Self-pay

## 2021-12-13 DIAGNOSIS — J45909 Unspecified asthma, uncomplicated: Secondary | ICD-10-CM | POA: Diagnosis not present

## 2021-12-13 DIAGNOSIS — R0789 Other chest pain: Secondary | ICD-10-CM | POA: Diagnosis not present

## 2021-12-13 DIAGNOSIS — R350 Frequency of micturition: Secondary | ICD-10-CM | POA: Diagnosis not present

## 2021-12-13 DIAGNOSIS — R072 Precordial pain: Secondary | ICD-10-CM | POA: Insufficient documentation

## 2021-12-13 DIAGNOSIS — R079 Chest pain, unspecified: Secondary | ICD-10-CM | POA: Diagnosis not present

## 2021-12-13 DIAGNOSIS — D72829 Elevated white blood cell count, unspecified: Secondary | ICD-10-CM | POA: Insufficient documentation

## 2021-12-13 LAB — URINALYSIS, ROUTINE W REFLEX MICROSCOPIC
Bilirubin Urine: NEGATIVE
Glucose, UA: NEGATIVE mg/dL
Hgb urine dipstick: NEGATIVE
Ketones, ur: NEGATIVE mg/dL
Nitrite: NEGATIVE
Protein, ur: NEGATIVE mg/dL
Specific Gravity, Urine: 1.011 (ref 1.005–1.030)
pH: 7 (ref 5.0–8.0)

## 2021-12-13 LAB — BASIC METABOLIC PANEL
Anion gap: 6 (ref 5–15)
BUN: 14 mg/dL (ref 6–20)
CO2: 27 mmol/L (ref 22–32)
Calcium: 9.1 mg/dL (ref 8.9–10.3)
Chloride: 105 mmol/L (ref 98–111)
Creatinine, Ser: 0.68 mg/dL (ref 0.44–1.00)
GFR, Estimated: >60 mL/min (ref >60)
Glucose, Bld: 84 mg/dL (ref 70–99)
Potassium: 3.7 mmol/L (ref 3.5–5.1)
Sodium: 138 mmol/L (ref 135–145)

## 2021-12-13 LAB — CBC
HCT: 42.9 % (ref 36.0–46.0)
Hemoglobin: 14.6 g/dL (ref 12.0–15.0)
MCH: 32.2 pg (ref 26.0–34.0)
MCHC: 34 g/dL (ref 30.0–36.0)
MCV: 94.7 fL (ref 80.0–100.0)
Platelets: 227 10*3/uL (ref 150–400)
RBC: 4.53 MIL/uL (ref 3.87–5.11)
RDW: 12.4 % (ref 11.5–15.5)
WBC: 12.2 10*3/uL — ABNORMAL HIGH (ref 4.0–10.5)
nRBC: 0 % (ref 0.0–0.2)

## 2021-12-13 LAB — TROPONIN I (HIGH SENSITIVITY)
Troponin I (High Sensitivity): <2 ng/L (ref <18)
Troponin I (High Sensitivity): 2 ng/L (ref <18)

## 2021-12-13 MED ORDER — KETOROLAC TROMETHAMINE 30 MG/ML IJ SOLN
30.0000 mg | Freq: Once | INTRAMUSCULAR | Status: AC
  Administered 2021-12-13: 30 mg via INTRAMUSCULAR
  Filled 2021-12-13: qty 1

## 2021-12-13 MED ORDER — DICLOFENAC SODIUM 1 % EX GEL: 2.0000 g | Freq: Four times a day (QID) | CUTANEOUS | 0 refills | Status: DC

## 2021-12-13 NOTE — ED Notes (Signed)
Lab called to add urine culture onto specimen in lab ?

## 2021-12-13 NOTE — ED Triage Notes (Signed)
Patient presents to ED with complaint of left side chest pain that began today and does not radiate and urinary frequency x 1 week. Denies N/V/D, SOB, headache, dizziness.  ?

## 2021-12-13 NOTE — ED Notes (Signed)
Sent down a blue tube.  ?

## 2021-12-13 NOTE — Discharge Instructions (Signed)

## 2021-12-13 NOTE — ED Provider Notes (Signed)
? ?Emergency Department Provider Note ? ? ?I have reviewed the triage vital signs and the nursing notes. ? ? ?HISTORY ? ?Chief Complaint ?Chest Pain and Urinary Frequency ? ? ?HPI ?Paula Warner is a 36 y.o. female presents to the emergency department for evaluation of left side chest pain along with some urine frequency.  Patient not having any nausea/vomiting/diarrhea.  No shortness of breath or dizziness.  No headaches.  Pain is sharp.  She states it began when she was lifting her younger family member.  Her pain is mainly when she is lifting her arms or with adduction.  ? ? ?Past Medical History:  ?Diagnosis Date  ? Asthma   ? History of asthma   ? no current med.  ? 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  ? on anticoagulants for a period of time, but no longer on them  ? History of kidney stones   ? Menstrual periods irregular   ? ? ?Review of Systems ? ?Constitutional: No fever/chills ?Eyes: No visual changes. ?ENT: No sore throat. ?Cardiovascular: Positive chest pain. ?Respiratory: Denies shortness of breath. ?Gastrointestinal: No abdominal pain.  No nausea, no vomiting.  No diarrhea.  No constipation. ?Genitourinary: Negative for dysuria. Positive urine frequency.  ?Musculoskeletal: Negative for back pain. ?Skin: Negative for rash. ?Neurological: Negative for headaches, focal weakness or numbness. ? ?____________________________________________ ? ? ?PHYSICAL EXAM: ? ?VITAL SIGNS: ?ED Triage Vitals  ?Enc Vitals Group  ?   BP 12/13/21 2032 124/85  ?   Pulse Rate 12/13/21 2032 72  ?   Resp 12/13/21 2032 18  ?   Temp 12/13/21 2032 98.3 ?F (36.8 ?C)  ?   Temp Source 12/13/21 2032 Oral  ?   SpO2 12/13/21 2032 100 %  ?   Weight 12/13/21 2030 150 lb (68 kg)  ?   Height 12/13/21 2030 5\' 1"  (1.549 m)  ? ?Constitutional: Alert and oriented. Well appearing and in no acute distress. ?Eyes: Conjunctivae are normal. ?Head: Atraumatic. ?Nose: No congestion/rhinnorhea. ?Mouth/Throat: Mucous membranes are moist.    ?Neck: No stridor.   ?Cardiovascular: Normal rate, regular rhythm. Good peripheral circulation. Grossly normal heart sounds.   ?Respiratory: Normal respiratory effort.  No retractions. Lungs CTAB. ?Gastrointestinal: Soft and nontender. No distention.  ?Musculoskeletal: No lower extremity tenderness nor edema. No gross deformities of extremities.  Pain reproduced with palpation of the left anterior chest wall along with abduction of the left upper extremity. ?Neurologic:  Normal speech and language. No gross focal neurologic deficits are appreciated.  ?Skin:  Skin is warm, dry and intact. No rash noted. ? ? ?____________________________________________ ?  ?LABS ?(all labs ordered are listed, but only abnormal results are displayed) ? ?Labs Reviewed  ?URINE CULTURE - Abnormal; Notable for the following components:  ?    Result Value  ? Culture MULTIPLE SPECIES PRESENT, SUGGEST RECOLLECTION (*)   ? All other components within normal limits  ?CBC - Abnormal; Notable for the following components:  ? WBC 12.2 (*)   ? All other components within normal limits  ?URINALYSIS, ROUTINE W REFLEX MICROSCOPIC - Abnormal; Notable for the following components:  ? Leukocytes,Ua TRACE (*)   ? Bacteria, UA RARE (*)   ? All other components within normal limits  ?BASIC METABOLIC PANEL  ?TROPONIN I (HIGH SENSITIVITY)  ?TROPONIN I (HIGH SENSITIVITY)  ? ?____________________________________________ ? ?EKG ? ? EKG Interpretation ? ?Date/Time:  Wednesday December 13 2021 81:15:72 EDT ?Ventricular Rate:  74 ?PR Interval:  174 ?QRS Duration: 96 ?QT  Interval:  394 ?QTC Calculation: 438 ?R Axis:   74 ?Text Interpretation: Sinus rhythm Confirmed by Nanda Quinton [phone]) on 12/13/2021 8:35:26 PM ?  ? ?  ? ? ?____________________________________________ ? ? ?PROCEDURES ? ?Procedure(s) performed:  ? ?Procedures ? ?None  ?____________________________________________ ? ? ?INITIAL IMPRESSION / ASSESSMENT AND PLAN / ED COURSE ? ?Pertinent labs & imaging  results that were available during my care of the patient were reviewed by me and considered in my medical decision making (see chart for details). ?  ?This patient is Presenting for Evaluation of CP, which does require a range of treatment options, and is a complaint that involves a high risk of morbidity and mortality. ? ?The Differential Diagnoses includes all life-threatening causes for chest pain. This includes but is not exclusive to acute coronary syndrome, aortic dissection, pulmonary embolism, cardiac tamponade, community-acquired pneumonia, pericarditis, musculoskeletal chest wall pain, etc. ? ? ?Critical Interventions-  ?  ?Medications  ?ketorolac (TORADOL) 30 MG/ML injection 30 mg (30 mg Intramuscular Given 12/13/21 2312)  ? ? ?Reassessment after intervention: Pain improved.  ? ? ?I decided to review pertinent External Data, and in summary no recent ED visits for similar. ?  ?Clinical Laboratory Tests Ordered, included patient mild leukocytosis to 12.2.  Troponin negative.  UA without infection.  Do plan to send for culture.  ? ?Radiologic Tests Ordered, included CXR. I independently interpreted the images and agree with radiology interpretation.  ? ?Cardiac Monitor Tracing which shows NSR ? ? ?Social Determinants of Health Risk no smoking history.  ? ? ?Medical Decision Making: Summary:  ?Patient presents with CP consistent with MSK pain. UA equivocal. Will send for culture. No abx for now.  ? ?Reevaluation with update and discussion with patient.  Work-up consistent with musculoskeletal etiology of chest pain.  Considered PE along with ACS but given exam and reassuring labs and vital signs do not feel that additional lab or imaging is indicated on an emergent basis. ? ?Disposition: discharge ? ?____________________________________________ ? ?FINAL CLINICAL IMPRESSION(S) / ED DIAGNOSES ? ?Final diagnoses:  ?Precordial pain  ? ? ? ?NEW OUTPATIENT MEDICATIONS STARTED DURING THIS VISIT: ? ?Discharge  Medication List as of 12/13/2021 11:15 PM  ?  ? ?START taking these medications  ? Details  ?diclofenac Sodium (VOLTAREN) 1 % GEL Apply 2 g topically 4 (four) times daily., Starting Wed 12/13/2021, Normal  ?  ?  ? ? ?Note:  This document was prepared using Dragon voice recognition software and may include unintentional dictation errors. ? ?Nanda Quinton, MD, FACEP ?Emergency Medicine ? ?  ?Margette Fast, MD ?12/16/21 [phone] ? ?

## 2021-12-15 LAB — URINE CULTURE

## 2021-12-19 DIAGNOSIS — R102 Pelvic and perineal pain: Secondary | ICD-10-CM | POA: Diagnosis not present

## 2021-12-19 DIAGNOSIS — Z8619 Personal history of other infectious and parasitic diseases: Secondary | ICD-10-CM | POA: Diagnosis not present

## 2021-12-19 DIAGNOSIS — D219 Benign neoplasm of connective and other soft tissue, unspecified: Secondary | ICD-10-CM | POA: Diagnosis not present

## 2022-01-02 DIAGNOSIS — H6121 Impacted cerumen, right ear: Secondary | ICD-10-CM | POA: Diagnosis not present

## 2022-01-02 DIAGNOSIS — H60502 Unspecified acute noninfective otitis externa, left ear: Secondary | ICD-10-CM | POA: Diagnosis not present

## 2022-01-22 DIAGNOSIS — Z6841 Body Mass Index (BMI) 40.0 and over, adult: Secondary | ICD-10-CM | POA: Diagnosis not present

## 2022-01-22 DIAGNOSIS — F419 Anxiety disorder, unspecified: Secondary | ICD-10-CM | POA: Diagnosis not present

## 2022-01-22 DIAGNOSIS — Z793 Long term (current) use of hormonal contraceptives: Secondary | ICD-10-CM | POA: Diagnosis not present

## 2022-01-22 DIAGNOSIS — Z79899 Other long term (current) drug therapy: Secondary | ICD-10-CM | POA: Diagnosis not present

## 2022-01-22 DIAGNOSIS — J309 Allergic rhinitis, unspecified: Secondary | ICD-10-CM | POA: Diagnosis not present

## 2022-02-26 DIAGNOSIS — Z712 Person consulting for explanation of examination or test findings: Secondary | ICD-10-CM | POA: Diagnosis not present

## 2022-02-26 DIAGNOSIS — E559 Vitamin D deficiency, unspecified: Secondary | ICD-10-CM | POA: Diagnosis not present

## 2022-02-26 DIAGNOSIS — Z6839 Body mass index (BMI) 39.0-39.9, adult: Secondary | ICD-10-CM | POA: Diagnosis not present

## 2022-02-26 DIAGNOSIS — Z79899 Other long term (current) drug therapy: Secondary | ICD-10-CM | POA: Diagnosis not present

## 2022-02-27 DIAGNOSIS — Z308 Encounter for other contraceptive management: Secondary | ICD-10-CM | POA: Diagnosis not present

## 2022-03-21 DIAGNOSIS — Z01411 Encounter for gynecological examination (general) (routine) with abnormal findings: Secondary | ICD-10-CM | POA: Diagnosis not present

## 2022-03-21 DIAGNOSIS — Z124 Encounter for screening for malignant neoplasm of cervix: Secondary | ICD-10-CM | POA: Diagnosis not present

## 2022-03-21 DIAGNOSIS — D259 Leiomyoma of uterus, unspecified: Secondary | ICD-10-CM | POA: Diagnosis not present

## 2022-03-22 ENCOUNTER — Other Ambulatory Visit: Payer: Self-pay

## 2022-03-22 ENCOUNTER — Encounter (HOSPITAL_COMMUNITY): Payer: Self-pay

## 2022-03-22 ENCOUNTER — Emergency Department (HOSPITAL_COMMUNITY)
Admission: EM | Admit: 2022-03-22 | Discharge: 2022-03-22 | Disposition: A | Discharge status: Home / Self Care | Payer: Self-pay | Attending: Emergency Medicine | Admitting: Emergency Medicine

## 2022-03-22 ENCOUNTER — Emergency Department (HOSPITAL_COMMUNITY): Payer: Self-pay

## 2022-03-22 DIAGNOSIS — N9489 Other specified conditions associated with female genital organs and menstrual cycle: Secondary | ICD-10-CM | POA: Diagnosis not present

## 2022-03-22 DIAGNOSIS — N201 Calculus of ureter: Secondary | ICD-10-CM | POA: Insufficient documentation

## 2022-03-22 DIAGNOSIS — Z9851 Tubal ligation status: Secondary | ICD-10-CM | POA: Diagnosis not present

## 2022-03-22 DIAGNOSIS — I10 Essential (primary) hypertension: Secondary | ICD-10-CM | POA: Diagnosis not present

## 2022-03-22 DIAGNOSIS — Q433 Congenital malformations of intestinal fixation: Secondary | ICD-10-CM | POA: Diagnosis not present

## 2022-03-22 DIAGNOSIS — R109 Unspecified abdominal pain: Secondary | ICD-10-CM | POA: Diagnosis not present

## 2022-03-22 DIAGNOSIS — Z7951 Long term (current) use of inhaled steroids: Secondary | ICD-10-CM | POA: Diagnosis not present

## 2022-03-22 DIAGNOSIS — R1084 Generalized abdominal pain: Secondary | ICD-10-CM | POA: Diagnosis not present

## 2022-03-22 DIAGNOSIS — J45909 Unspecified asthma, uncomplicated: Secondary | ICD-10-CM | POA: Insufficient documentation

## 2022-03-22 DIAGNOSIS — N132 Hydronephrosis with renal and ureteral calculous obstruction: Secondary | ICD-10-CM | POA: Diagnosis not present

## 2022-03-22 DIAGNOSIS — Z9049 Acquired absence of other specified parts of digestive tract: Secondary | ICD-10-CM | POA: Diagnosis not present

## 2022-03-22 LAB — URINALYSIS, ROUTINE W REFLEX MICROSCOPIC
Bacteria, UA: NONE SEEN
Bilirubin Urine: NEGATIVE
Glucose, UA: NEGATIVE mg/dL
Ketones, ur: NEGATIVE mg/dL
Leukocytes,Ua: NEGATIVE
Nitrite: NEGATIVE
Protein, ur: NEGATIVE mg/dL
Specific Gravity, Urine: 1.021 (ref 1.005–1.030)
pH: 7 (ref 5.0–8.0)

## 2022-03-22 LAB — CBC WITH DIFFERENTIAL/PLATELET
Abs Immature Granulocytes: 0.01 10*3/uL (ref 0.00–0.07)
Basophils Absolute: 0 10*3/uL (ref 0.0–0.1)
Basophils Relative: 1 %
Eosinophils Absolute: 0.1 10*3/uL (ref 0.0–0.5)
Eosinophils Relative: 1 %
HCT: 42.8 % (ref 36.0–46.0)
Hemoglobin: 14.5 g/dL (ref 12.0–15.0)
Immature Granulocytes: 0 %
Lymphocytes Relative: 36 %
Lymphs Abs: 2.7 10*3/uL (ref 0.7–4.0)
MCH: 32.2 pg (ref 26.0–34.0)
MCHC: 33.9 g/dL (ref 30.0–36.0)
MCV: 94.9 fL (ref 80.0–100.0)
Monocytes Absolute: 0.8 10*3/uL (ref 0.1–1.0)
Monocytes Relative: 10 %
Neutro Abs: 4 10*3/uL (ref 1.7–7.7)
Neutrophils Relative %: 52 %
Platelets: 223 10*3/uL (ref 150–400)
RBC: 4.51 MIL/uL (ref 3.87–5.11)
RDW: 12.2 % (ref 11.5–15.5)
WBC: 7.7 10*3/uL (ref 4.0–10.5)
nRBC: 0 % (ref 0.0–0.2)

## 2022-03-22 LAB — COMPREHENSIVE METABOLIC PANEL
ALT: 49 U/L — ABNORMAL HIGH (ref 0–44)
AST: 34 U/L (ref 15–41)
Albumin: 3.9 g/dL (ref 3.5–5.0)
Alkaline Phosphatase: 70 U/L (ref 38–126)
Anion gap: 9 (ref 5–15)
BUN: 12 mg/dL (ref 6–20)
CO2: 26 mmol/L (ref 22–32)
Calcium: 9.2 mg/dL (ref 8.9–10.3)
Chloride: 108 mmol/L (ref 98–111)
Creatinine, Ser: 0.75 mg/dL (ref 0.44–1.00)
GFR, Estimated: >60 mL/min (ref >60)
Glucose, Bld: 100 mg/dL — ABNORMAL HIGH (ref 70–99)
Potassium: 3.2 mmol/L — ABNORMAL LOW (ref 3.5–5.1)
Sodium: 143 mmol/L (ref 135–145)
Total Bilirubin: 3.3 mg/dL — ABNORMAL HIGH (ref 0.3–1.2)
Total Protein: 7.7 g/dL (ref 6.5–8.1)

## 2022-03-22 LAB — I-STAT BETA HCG BLOOD, ED (MC, WL, AP ONLY): I-stat hCG, quantitative: <5 m[IU]/mL (ref <5)

## 2022-03-22 LAB — LIPASE, BLOOD: Lipase: 23 U/L (ref 11–51)

## 2022-03-22 MED ORDER — OXYCODONE-ACETAMINOPHEN 5-325 MG PO TABS: 1.0000 | ORAL_TABLET | Freq: Four times a day (QID) | ORAL | 0 refills | Status: AC | PRN

## 2022-03-22 MED ORDER — POTASSIUM CHLORIDE CRYS ER 20 MEQ PO TBCR: 20.0000 meq | EXTENDED_RELEASE_TABLET | Freq: Two times a day (BID) | ORAL | 0 refills | Status: DC

## 2022-03-22 MED ORDER — FENTANYL CITRATE PF 50 MCG/ML IJ SOSY
50.0000 ug | PREFILLED_SYRINGE | Freq: Once | INTRAMUSCULAR | Status: AC
  Administered 2022-03-22: 50 ug via INTRAVENOUS
  Filled 2022-03-22: qty 1

## 2022-03-22 MED ORDER — KETOROLAC TROMETHAMINE 15 MG/ML IJ SOLN
15.0000 mg | Freq: Once | INTRAMUSCULAR | Status: AC
  Administered 2022-03-22: 15 mg via INTRAVENOUS
  Filled 2022-03-22: qty 1

## 2022-03-22 MED ORDER — ONDANSETRON 4 MG PO TBDP: 4.0000 mg | ORAL_TABLET | Freq: Three times a day (TID) | ORAL | 0 refills | Status: DC | PRN

## 2022-03-22 MED ORDER — POTASSIUM CHLORIDE CRYS ER 20 MEQ PO TBCR
40.0000 meq | EXTENDED_RELEASE_TABLET | Freq: Once | ORAL | Status: AC
Start: 2022-03-22 — End: 2022-03-22
  Administered 2022-03-22: 40 meq via ORAL
  Filled 2022-03-22: qty 2

## 2022-03-22 MED ORDER — MORPHINE SULFATE (PF) 4 MG/ML IV SOLN
4.0000 mg | Freq: Once | INTRAVENOUS | Status: AC
  Administered 2022-03-22: 4 mg via INTRAVENOUS
  Filled 2022-03-22: qty 1

## 2022-03-22 MED ORDER — TAMSULOSIN HCL 0.4 MG PO CAPS: 0.4000 mg | ORAL_CAPSULE | Freq: Every day | ORAL | 0 refills | Status: AC

## 2022-03-22 MED ORDER — ONDANSETRON HCL 4 MG/2ML IJ SOLN
4.0000 mg | Freq: Once | INTRAMUSCULAR | Status: AC
  Administered 2022-03-22: 4 mg via INTRAVENOUS
  Filled 2022-03-22: qty 2

## 2022-03-22 NOTE — ED Triage Notes (Signed)
EMS reports from home, abdominal/flank pain since yesterday. Hx of kidney stones, Pt states feels similar  BP 154/110 HR 84 RR 20 Sp02 98 RA Temp 98.1  20 LAC

## 2022-03-22 NOTE — ED Provider Notes (Signed)
[facility] DEPT Provider Note   CSN: [id number] Arrival date & time: 03/22/22  0736     History  Chief Complaint  Patient presents with   Flank Pain    Paula Warner is a 36 y.o. 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  Presents to the emergency department with complaint of flank pain.  Patient reports that she is having pain to her left flank.  Pain started yesterday and has been constant since then.  Pain was tolerable yesterday however today at 6 AM pain became much worse.  Pain has gotten gradually worse since then.  Pain does not radiate.  Patient reports the pain feels similar to previous episodes of kidney stone she has had in the past.  Patient does endorse urinary frequency.  Patient reports that last oral intake was yesterday evening.  Patient denies any recent falls or traumatic injuries.  Denies any fever, chills, nausea, vomiting, constipation, diarrhea, blood in stool, melena, dysuria, hematuria, urinary urgency, vaginal pain, vaginal bleeding, vaginal discharge.  Last menstrual period 1 week prior.   Flank Pain Pertinent negatives include no chest pain, no abdominal pain, no headaches and no shortness of breath.       Home Medications Prior to Admission medications   Medication Sig Start Date End Date Taking? Authorizing Provider  acetaminophen (TYLENOL) 325 MG tablet Take 2 tablets (650 mg total) by mouth every 6 (six) hours as needed (pain). 02/06/20   Florencia Reasons, MD  bismuth subsalicylate (PEPTO BISMOL) 262 MG/15ML suspension Take 30 mLs by mouth every 6 (six) hours as needed for indigestion or diarrhea or loose stools.     [provider]  cholecalciferol (VITAMIN D3) 25 MCG (1000 UNIT) tablet Take 1,000 Units by mouth daily.     [provider]  diclofenac Sodium (VOLTAREN) 1 % GEL Apply 2 g topically 4 (four) times daily. 12/13/21   Long, Wonda Olds, MD  Drospirenone (SLYND) 4 MG TABS Take 1 tablet by mouth daily. 05/09/21   Constant, Peggy, MD  HYDROcodone-acetaminophen (NORCO) 5-325 MG tablet Take 1 tablet by mouth every 4 (four) hours as needed for severe pain. 09/15/20   Sherwood Gambler, MD  ibuprofen (ADVIL) 400 MG tablet Take 1 tablet (400 mg total) by mouth every 6 (six) hours as needed. Patient not taking: Reported on 05/09/2021 09/15/20   Sherwood Gambler, MD  medroxyPROGESTERone (PROVERA) 10 MG tablet Take 1 tablet (10 mg total) by mouth daily. Patient not taking: Reported on 05/09/2021 04/11/20   Leftwich-Kirby, Kathie Dike, CNM  metroNIDAZOLE (FLAGYL) 500 MG tablet Take 1 tablet (500 mg total) by mouth 2 (two) times daily. 05/11/21   Constant, Peggy, MD  norethindrone (MICRONOR) 0.35 MG tablet Take 1 tablet (0.35 mg total) by mouth daily. 05/09/21   Constant, Peggy, MD  ondansetron (ZOFRAN ODT) 4 MG disintegrating tablet Take 1 tablet (4 mg total) by mouth every 8 (eight) hours as needed for nausea or vomiting. 09/15/20   Sherwood Gambler, MD  ondansetron (ZOFRAN) 4 MG tablet Take 1 tablet (4 mg total) by mouth every 6 (six) hours. 06/28/21   Milton Ferguson, MD  predniSONE (DELTASONE) 20 MG tablet Take 20 mg by mouth daily. 05/25/20   [provider]  promethazine (PHENERGAN) 25 MG suppository Place 1 suppository (25 mg total) rectally every 8 (eight) hours  as needed for nausea or vomiting. 09/17/20   Lorin Glass, PA-C  tamsulosin (FLOMAX) 0.4 MG CAPS capsule Take 1 capsule (0.4 mg total) by mouth daily as needed (Kidney stone pain). 09/17/20   Lorin Glass, PA-C  terbinafine (LAMISIL) 250 MG tablet Take 1 tablet (250 mg total) by mouth daily. 06/28/20   Trula Slade, DPM      Allergies    Zinacef [cefuroxime]    Review of Systems   Review of Systems  Constitutional:  Negative for  chills and fever.  Respiratory:  Negative for shortness of breath.   Cardiovascular:  Negative for chest pain.  Gastrointestinal:  Negative for abdominal distention, abdominal pain, anal bleeding, blood in stool, constipation, diarrhea, nausea, rectal pain and vomiting.  Genitourinary:  Positive for flank pain and frequency. Negative for difficulty urinating, dysuria, hematuria, pelvic pain, urgency, vaginal bleeding, vaginal discharge and vaginal pain.  Musculoskeletal:  Negative for back pain and neck pain.  Skin:  Negative for color change and rash.  Neurological:  Negative for dizziness, syncope, light-headedness and headaches.  Psychiatric/Behavioral:  Negative for confusion.     Physical Exam Updated Vital Signs BP (!) 137/104   Pulse 90   Temp 97.7 F (36.5 C) (Oral)   Resp 20   SpO2 100%  Physical Exam Vitals and nursing note reviewed.  Constitutional:      General: She is not in acute distress.    Appearance: She is not ill-appearing, toxic-appearing or diaphoretic.     Comments: Appears uncomfortable due to reports of flank pain  HENT:     Head: Normocephalic.  Eyes:     General: No scleral icterus.       Right eye: No discharge.        Left eye: No discharge.  Cardiovascular:     Rate and Rhythm: Normal rate.  Pulmonary:     Effort: Pulmonary effort is normal.  Abdominal:     General: Abdomen is flat. There is no distension. There are no signs of injury.     Palpations: Abdomen is soft. There is no mass or pulsatile mass.     Tenderness: There is no abdominal tenderness. There is left CVA tenderness. There is no right CVA tenderness, guarding or rebound.     Hernia: There is no hernia in the umbilical area or ventral area.     Comments: Tenderness to left flank/CVA  Skin:    General: Skin is warm and dry.  Neurological:     General: No focal deficit present.     Mental Status: She is alert.     GCS: GCS eye subscore is 4. GCS verbal subscore is 5. GCS motor  subscore is 6.  Psychiatric:        Mood and Affect: Affect is tearful.        Behavior: Behavior is cooperative.     ED Results / Procedures / Treatments   Labs (all labs ordered are listed, but only abnormal results are displayed) Labs Reviewed  COMPREHENSIVE METABOLIC PANEL - Abnormal; Notable for the following components:      Result Value   Potassium 3.2 (*)    Glucose, Bld 100 (*)    ALT 49 (*)    Total Bilirubin 3.3 (*)    All other components within normal limits  URINALYSIS, ROUTINE W REFLEX MICROSCOPIC - Abnormal; Notable for the following components:   APPearance HAZY (*)    Hgb urine dipstick MODERATE (*)    All  other components within normal limits  URINE CULTURE  CBC WITH DIFFERENTIAL/PLATELET  LIPASE, BLOOD  I-STAT BETA HCG BLOOD, ED ([facility], [facility], [facility] ONLY)    EKG None  Radiology CT Renal Stone Study  Result Date: 03/22/2022 CLINICAL DATA:  36 year old female with left abdominal and flank pain since yesterday. Kidney stones. EXAM: CT ABDOMEN AND PELVIS WITHOUT CONTRAST TECHNIQUE: Multidetector CT imaging of the abdomen and pelvis was performed following the standard protocol without IV contrast. RADIATION DOSE REDUCTION: This exam was performed according to the departmental dose-optimization program which includes automated exposure control, adjustment of the mA and/or kV according to patient size and/or use of iterative reconstruction technique. COMPARISON:  CT Abdomen and Pelvis 06/28/2021. FINDINGS: Lower chest: Partially visible chronic pulmonary AVM on series 6, image 1. See Chest CTA 01/09/2020. Mild atelectasis or scarring in the costophrenic angle unchanged since 2021. No pericardial or pleural effusion. Hepatobiliary: Negative, chronically absent gallbladder. Pancreas: Negative. Spleen: Negative. Adrenals/Urinary Tract: Normal adrenal glands. Chronic right nephrolithiasis without hydronephrosis. Decompressed right ureter to the bladder. Moderate left hydronephrosis  and left hydroureter into the pelvis to the level of a 5 mm distal ureteral calculus located about 15 mm from the left ureterovesical junction (coronal image 82 and series 2, image 74). Decompressed bladder. Other left intrarenal calculi are generally punctate and appear somewhat decreased in number since last year. Stomach/Bowel: Redundant but decompressed sigmoid colon in the pelvis. Decompressed left colon. Mild retained stool in the right and transverse colon. Normal appendix on series 4, image 64. Decompressed terminal ileum. No dilated small bowel. Negative stomach and duodenum. No free air or free fluid. Vascular/Lymphatic: Chronic left iliac venous stenting. Chronic superficial venous collaterals at the level of the mons. Vascular patency is not evaluated in the absence of IV contrast. No calcified arterial atherosclerosis or lymphadenopathy identified. Normal caliber abdominal aorta. Reproductive: Stable bilateral tubal ligation clips. Other: No pelvic free fluid. Musculoskeletal: No acute osseous abnormality identified. IMPRESSION: 1. Acute obstructive uropathy on the left due to a 5 mm distal ureteral calculus located about 15 mm from the left UVJ. 2. Chronic nephrolithiasis. 3. 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 4. Chronic left lung AVM. Electronically Signed   By: Genevie Ann M.D.   On: 03/22/2022 09:34    Procedures Procedures    Medications Ordered in ED Medications  morphine (PF) 4 MG/ML injection 4 mg (4 mg Intravenous Given 03/22/22 0803)  ondansetron (ZOFRAN) injection 4 mg (4 mg Intravenous Given 03/22/22 0804)  fentaNYL (SUBLIMAZE) injection 50 mcg (50 mcg Intravenous Given 03/22/22 0846)  ketorolac (TORADOL) 15 MG/ML injection 15 mg (15 mg Intravenous Given 03/22/22 1048)  potassium chloride SA (KLOR-CON M) CR tablet 40 mEq (40 mEq Oral Given 03/22/22 1047)    ED Course/ Medical Decision Making/ A&P Clinical Course as of 03/22/22 1207  Thu Mar 22, 2022  1025 I spoke to urologist Dr. Lovena Neighbours who agreed with plan for pain management, Flomax, and follow-up with urology in the outpatient setting. [PB]    Clinical Course User Index [PB] Loni Beckwith, PA-C                           Medical Decision Making Amount and/or Complexity of Data Reviewed Labs: ordered. Radiology: ordered.  Risk Prescription drug management.   Alert 36 year old female in no acute distress, nontoxic-appearing.  Presents the emergency department for complaint of left flank pain.  Information obtained from patient.  I reviewed patient's past medical records including previous provider notes, notes from specialist, labs, and imaging.  Patient has medical history as outlined in HPI which complicates her care.  With left flank pain differential diagnosis includes but is not limited to ureteral/renal calculus, pyelonephritis, sciatica, musculoskeletal injury.  I suspect possible ureteral/renal calculus as patient has had multiple episodes previously.  Will obtain CMP, CBC, lipase, UA, urine culture, CT renal.  Patient given morphine and Zofran for pain management.  We will make patient n.p.o. at this time.  I personally viewed and interpreted patient's lab results.  Pertinent findings include: -Urinalysis shows no signs of infection -CBC unremarkable with no leukocytosis or anemia -Potassium 3.2 -I-STAT hCG within normal limits  I personally viewed interpret patient's CT imaging.  Agree with radiology interpretation of acute obstructive uropathy on the left 2 to 5 mm distal ureteral calculus located about 15 mm from the left UVJ.  Due to findings of left ureteral stone I reached out to urology and spoke to Dr. Lovena Neighbours.  Please see above note for further information on this conversation.  Patient has improvement of pain after receiving morphine, fentanyl, and Toradol.  On serial reexamination abdomen soft, nondistended, not tender with improvement in  tenderness to left flank.  Patient able to tolerate p.o. intake without difficulty.  Patient received potassium due to hypokalemia noted on lab testing.  Will discharge patient with prescription for Zofran, Percocet, Flomax, and potassium.  Patient follow-up with urology and PCP.  Based on patient's chief complaint, I considered admission might be necessary, however after reassuring ED workup feel patient is reasonable for discharge.  Discussed results, findings, treatment and follow up. Patient advised of return precautions. Patient verbalized understanding and agreed with plan.  Portions of this note were generated with [employer]. Dictation errors may occur despite best attempts at proofreading.         Final Clinical Impression(s) / ED Diagnoses Final diagnoses:  Left ureteral stone    Rx / DC Orders ED Discharge Orders          Ordered    oxyCODONE-acetaminophen (PERCOCET/ROXICET) 5-325 MG tablet  Every 6 hours PRN        03/22/22 1201    tamsulosin (FLOMAX) 0.4 MG CAPS capsule  Daily        03/22/22 1201    ondansetron (ZOFRAN-ODT) 4 MG disintegrating tablet  Every 8 hours PRN        03/22/22 1201    potassium chloride SA (KLOR-CON M) 20 MEQ tablet  2 times daily        03/22/22 1208              Dyann Ruddle 03/22/22 2041    Ezequiel Essex, MD 03/23/22 [phone]

## 2022-03-22 NOTE — Discharge Instructions (Addendum)
You came to the emergency today to be evaluated for your flank pain.  The CT scan of your abdomen pelvis showed a 5 mm ureteral stone.  Based on the size of your stone that should pass on its own.  Please follow-up with [facility] for further management of your ureteral stone.  I have given you prescription for Flomax which will help pass the stone alone.  I have also given you prescription for Percocet to help with your pain management.  Additionally you may take ibuprofen starting tomorrow afternoon to also help with your pain.  Today you received medications that may make you sleepy or impair your ability to make decisions.  For the next 24 hours please do not drive, operate heavy machinery, care for a small child with out another adult present, or perform any activities that may cause harm to you or someone else if you were to fall asleep or be impaired.   You are being prescribed a medication which may make you sleepy or mpair your ability to make decisions. Please follow up of listed precautions for at least 24 hours after taking one dose.  While in the emergency department your potassium was found to be low.  I have given you prescription for potassium.  Please follow-up with your primary care doctor to have this level rechecked.  Get help right away if: You have a fever or chills. You develop severe pain. You develop new abdominal pain. You faint. You are unable to urinate.

## 2022-03-23 LAB — URINE CULTURE

## 2022-03-26 ENCOUNTER — Telehealth (HOSPITAL_COMMUNITY): Payer: Self-pay | Admitting: Emergency Medicine

## 2022-03-26 MED ORDER — OXYCODONE-ACETAMINOPHEN 5-325 MG PO TABS: 1.0000 | ORAL_TABLET | Freq: Four times a day (QID) | ORAL | 0 refills | Status: DC | PRN

## 2022-03-26 NOTE — Telephone Encounter (Signed)
Received call from pharmacy requesting patient's Percocet prescription be changed.  There was not a pharmacy assigned at time of discharge so therefore I am unable to write a new prescription.

## 2022-04-04 ENCOUNTER — Encounter (HOSPITAL_COMMUNITY): Payer: Self-pay

## 2022-04-04 ENCOUNTER — Other Ambulatory Visit: Payer: Self-pay

## 2022-04-04 ENCOUNTER — Emergency Department (HOSPITAL_COMMUNITY)
Admission: EM | Admit: 2022-04-04 | Discharge: 2022-04-05 | Disposition: A | Discharge status: Home / Self Care | Payer: Commercial Managed Care - HMO | Source: Home / Self Care | Attending: Emergency Medicine | Admitting: Emergency Medicine

## 2022-04-04 ENCOUNTER — Emergency Department (HOSPITAL_COMMUNITY)
Admission: EM | Admit: 2022-04-04 | Discharge: 2022-04-04 | Disposition: A | Discharge status: Home / Self Care | Payer: Commercial Managed Care - HMO | Attending: Emergency Medicine | Admitting: Emergency Medicine

## 2022-04-04 ENCOUNTER — Emergency Department (HOSPITAL_COMMUNITY): Payer: Commercial Managed Care - HMO

## 2022-04-04 DIAGNOSIS — J45909 Unspecified asthma, uncomplicated: Secondary | ICD-10-CM | POA: Diagnosis not present

## 2022-04-04 DIAGNOSIS — R112 Nausea with vomiting, unspecified: Secondary | ICD-10-CM | POA: Insufficient documentation

## 2022-04-04 DIAGNOSIS — N200 Calculus of kidney: Secondary | ICD-10-CM | POA: Insufficient documentation

## 2022-04-04 DIAGNOSIS — R109 Unspecified abdominal pain: Secondary | ICD-10-CM

## 2022-04-04 DIAGNOSIS — N132 Hydronephrosis with renal and ureteral calculous obstruction: Secondary | ICD-10-CM | POA: Insufficient documentation

## 2022-04-04 DIAGNOSIS — R1031 Right lower quadrant pain: Secondary | ICD-10-CM | POA: Diagnosis present

## 2022-04-04 LAB — COMPREHENSIVE METABOLIC PANEL
ALT: 76 U/L — ABNORMAL HIGH (ref 0–44)
AST: 71 U/L — ABNORMAL HIGH (ref 15–41)
Albumin: 3.7 g/dL (ref 3.5–5.0)
Alkaline Phosphatase: 70 U/L (ref 38–126)
Anion gap: 6 (ref 5–15)
BUN: 14 mg/dL (ref 6–20)
CO2: 28 mmol/L (ref 22–32)
Calcium: 8.9 mg/dL (ref 8.9–10.3)
Chloride: 108 mmol/L (ref 98–111)
Creatinine, Ser: 0.65 mg/dL (ref 0.44–1.00)
GFR, Estimated: >60 mL/min (ref >60)
Glucose, Bld: 102 mg/dL — ABNORMAL HIGH (ref 70–99)
Potassium: 3.7 mmol/L (ref 3.5–5.1)
Sodium: 142 mmol/L (ref 135–145)
Total Bilirubin: 1.3 mg/dL — ABNORMAL HIGH (ref 0.3–1.2)
Total Protein: 7.3 g/dL (ref 6.5–8.1)

## 2022-04-04 LAB — URINALYSIS, ROUTINE W REFLEX MICROSCOPIC
Bacteria, UA: NONE SEEN
Bilirubin Urine: NEGATIVE
Glucose, UA: NEGATIVE mg/dL
Ketones, ur: NEGATIVE mg/dL
Leukocytes,Ua: NEGATIVE
Nitrite: NEGATIVE
Protein, ur: NEGATIVE mg/dL
RBC / HPF: >50 RBC/hpf — ABNORMAL HIGH (ref 0–5)
Specific Gravity, Urine: 1.021 (ref 1.005–1.030)
pH: 9 — ABNORMAL HIGH (ref 5.0–8.0)

## 2022-04-04 LAB — CBC WITH DIFFERENTIAL/PLATELET
Abs Immature Granulocytes: 0.03 10*3/uL (ref 0.00–0.07)
Basophils Absolute: 0 10*3/uL (ref 0.0–0.1)
Basophils Relative: 0 %
Eosinophils Absolute: 0.1 10*3/uL (ref 0.0–0.5)
Eosinophils Relative: 1 %
HCT: 38.5 % (ref 36.0–46.0)
Hemoglobin: 13 g/dL (ref 12.0–15.0)
Immature Granulocytes: 0 %
Lymphocytes Relative: 27 %
Lymphs Abs: 2.7 10*3/uL (ref 0.7–4.0)
MCH: 32 pg (ref 26.0–34.0)
MCHC: 33.8 g/dL (ref 30.0–36.0)
MCV: 94.8 fL (ref 80.0–100.0)
Monocytes Absolute: 1 10*3/uL (ref 0.1–1.0)
Monocytes Relative: 10 %
Neutro Abs: 6.3 10*3/uL (ref 1.7–7.7)
Neutrophils Relative %: 62 %
Platelets: 239 10*3/uL (ref 150–400)
RBC: 4.06 MIL/uL (ref 3.87–5.11)
RDW: 12 % (ref 11.5–15.5)
WBC: 10.1 10*3/uL (ref 4.0–10.5)
nRBC: 0 % (ref 0.0–0.2)

## 2022-04-04 LAB — I-STAT BETA HCG BLOOD, ED (MC, WL, AP ONLY): I-stat hCG, quantitative: <5 m[IU]/mL (ref <5)

## 2022-04-04 LAB — LIPASE, BLOOD: Lipase: 28 U/L (ref 11–51)

## 2022-04-04 MED ORDER — KETOROLAC TROMETHAMINE 30 MG/ML IJ SOLN
30.0000 mg | Freq: Once | INTRAMUSCULAR | Status: AC
Start: 2022-04-04 — End: 2022-04-04
  Administered 2022-04-04: 30 mg via INTRAVENOUS
  Filled 2022-04-04: qty 1

## 2022-04-04 MED ORDER — MORPHINE SULFATE (PF) 4 MG/ML IV SOLN
4.0000 mg | Freq: Once | INTRAVENOUS | Status: AC
  Administered 2022-04-04: 4 mg via INTRAVENOUS
  Filled 2022-04-04: qty 1

## 2022-04-04 MED ORDER — IOHEXOL 300 MG/ML  SOLN
100.0000 mL | Freq: Once | INTRAMUSCULAR | Status: AC | PRN
  Administered 2022-04-04: 100 mL via INTRAVENOUS

## 2022-04-04 MED ORDER — TAMSULOSIN HCL 0.4 MG PO CAPS: 0.4000 mg | ORAL_CAPSULE | Freq: Every day | ORAL | 0 refills | Status: DC

## 2022-04-04 MED ORDER — SODIUM CHLORIDE (PF) 0.9 % IJ SOLN
INTRAMUSCULAR | Status: AC
  Filled 2022-04-04: qty 50

## 2022-04-04 MED ORDER — ONDANSETRON HCL 4 MG/2ML IJ SOLN
4.0000 mg | Freq: Once | INTRAMUSCULAR | Status: AC
  Administered 2022-04-04: 4 mg via INTRAVENOUS
  Filled 2022-04-04: qty 2

## 2022-04-04 MED ORDER — ONDANSETRON 8 MG PO TBDP: 8.0000 mg | ORAL_TABLET | Freq: Three times a day (TID) | ORAL | 0 refills | Status: DC | PRN

## 2022-04-04 MED ORDER — OXYCODONE-ACETAMINOPHEN 5-325 MG PO TABS: 1.0000 | ORAL_TABLET | Freq: Four times a day (QID) | ORAL | 0 refills | Status: DC | PRN

## 2022-04-04 NOTE — ED Triage Notes (Signed)
BIBA from home for RLQ pain x2hrs with n/v, emesis x1.  Pain 10/10 Tylenol @ home with no relief.

## 2022-04-04 NOTE — ED Triage Notes (Signed)
Pt BIB EMS with c/o flank pain related to kidney stone. Pt was dc'd at 0630 this morning with prescriptions for  pain medication but did not pick them up.   EMS vitals: BP 118/78  HR 78  SPO2 100% 116

## 2022-04-04 NOTE — ED Provider Notes (Signed)
[facility] DEPT Provider Note   CSN: [id number] Arrival date & time: 04/04/22  6213     History  Chief Complaint  Patient presents with   Abdominal Pain    Paula Warner is a 36 y.o. female.   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  It started 2 hours prior to arrival, did not radiate elsewhere.  Associated with nausea and 1 episode of emesis.  No dysuria or hematuria, denies any fevers at home.  Not having any change in bowel habits, no blood in stool or vomit.  No vaginal discharge.  Home Medications Prior to Admission medications   Medication Sig Start Date End Date Taking? Authorizing Provider  ondansetron (ZOFRAN-ODT) 8 MG disintegrating tablet Take 1 tablet (8 mg total) by mouth every 8 (eight) hours as needed for nausea or vomiting. 04/04/22  Yes Sherrill Raring, PA-C  oxyCODONE-acetaminophen (PERCOCET/ROXICET) 5-325 MG tablet Take 1 tablet by mouth every 6 (six) hours as needed for severe pain. 04/04/22  Yes Sherrill Raring, PA-C  tamsulosin (FLOMAX) 0.4 MG CAPS capsule Take 1 capsule (0.4 mg total) by mouth daily after supper. 04/04/22  Yes Sherrill Raring, PA-C  diclofenac Sodium (VOLTAREN) 1 % GEL Apply 2 g topically 4 (four) times daily. Patient not taking: Reported on 03/22/2022 12/13/21   Long, Wonda Olds, MD  Drospirenone (SLYND) 4 MG TABS Take 1 tablet by mouth daily. Patient not taking: Reported on 03/22/2022 05/09/21   Constant, Peggy, MD  medroxyPROGESTERone (PROVERA) 10 MG tablet Take 1 tablet (10 mg total) by mouth daily. Patient not taking: Reported on 05/09/2021 04/11/20   Leftwich-Kirby, Kathie Dike, CNM  norethindrone (MICRONOR) 0.35 MG tablet Take 1 tablet (0.35 mg total) by mouth daily. Patient not taking: Reported on 03/22/2022 05/09/21   Constant, Peggy, MD   potassium chloride SA (KLOR-CON M) 20 MEQ tablet Take 1 tablet (20 mEq total) by mouth 2 (two) times daily for 5 days. 03/22/22 03/27/22  Loni Beckwith, PA-C  terbinafine (LAMISIL) 250 MG tablet Take 1 tablet (250 mg total) by mouth daily. Patient not taking: Reported on 03/22/2022 06/28/20   Trula Slade, DPM      Allergies    Zinacef [cefuroxime]    Review of Systems   Review of Systems  Gastrointestinal:  Positive for abdominal pain.    Physical Exam Updated Vital Signs BP 113/83 (BP Location: Left Arm)   Pulse 86   Temp 98 F (36.7 C)   Resp 18   Ht 5\' 1"  (1.549 m)   Wt 68 kg   LMP 03/15/2022 Comment: negative HCG  04/04/22  SpO2 93%   BMI 28.33 kg/m  Physical Exam Vitals and nursing note reviewed. Exam conducted with a chaperone present.  Constitutional:      Appearance: Normal appearance.  HENT:     Head: Normocephalic and atraumatic.  Eyes:     General: No scleral icterus.       Right eye: No discharge.        Left eye: No discharge.     Extraocular Movements: Extraocular movements intact.     Pupils: Pupils are equal, round, and reactive to light.  Cardiovascular:     Rate and Rhythm: Normal rate and regular rhythm.     Pulses: Normal pulses.     Heart sounds: Normal heart sounds.  No murmur heard.    No friction rub. No gallop.  Pulmonary:     Effort: Pulmonary effort is normal. No respiratory distress.     Breath sounds: Normal breath sounds.  Abdominal:     General: Abdomen is flat. A surgical scar is present. Bowel sounds are normal. There is no distension.     Palpations: Abdomen is soft.     Tenderness: There is abdominal tenderness in the right lower quadrant.  Skin:    General: Skin is warm and dry.     Coloration: Skin is not jaundiced.  Neurological:     Mental Status: She is alert. Mental status is at baseline.     Coordination: Coordination normal.     ED Results / Procedures / Treatments   Labs (all labs ordered are listed, but  only abnormal results are displayed) Labs Reviewed  COMPREHENSIVE METABOLIC PANEL - Abnormal; Notable for the following components:      Result Value   Glucose, Bld 102 (*)    AST 71 (*)    ALT 76 (*)    Total Bilirubin 1.3 (*)    All other components within normal limits  URINALYSIS, ROUTINE W REFLEX MICROSCOPIC - Abnormal; Notable for the following components:   APPearance CLOUDY (*)    pH 9.0 (*)    Hgb urine dipstick SMALL (*)    RBC / HPF >50 (*)    All other components within normal limits  CBC WITH DIFFERENTIAL/PLATELET  LIPASE, BLOOD  I-STAT BETA HCG BLOOD, ED ([facility], [facility], [facility] ONLY)    EKG None  Radiology CT Abdomen Pelvis W Contrast  Result Date: 04/04/2022 CLINICAL DATA:  36 year old female with history of right lower quadrant abdominal pain for several hours. EXAM: CT ABDOMEN AND PELVIS WITH CONTRAST TECHNIQUE: Multidetector CT imaging of the abdomen and pelvis was performed using the standard protocol following bolus administration of intravenous contrast. RADIATION DOSE REDUCTION: This exam was performed according to the departmental dose-optimization program which includes automated exposure control, adjustment of the mA and/or kV according to patient size and/or use of iterative reconstruction technique. CONTRAST:  142mL OMNIPAQUE IOHEXOL 300 MG/ML  SOLN COMPARISON:  CT the abdomen and pelvis 03/22/2022. FINDINGS: Lower chest: Chronic scarring in the lung bases bilaterally (left-greater-than-right). Hepatobiliary: No suspicious cystic or solid hepatic lesions. No intra or extrahepatic biliary ductal dilatation. Status post cholecystectomy. Pancreas: No pancreatic mass. No pancreatic ductal dilatation. No pancreatic or peripancreatic fluid collections or inflammatory changes. Spleen: Unremarkable. Adrenals/Urinary Tract: Multiple nonobstructive calculi are noted within the collecting systems of both kidneys measuring up to 3 mm in the lower pole collecting system of the right  kidney. In addition, in the distal third of the right ureter (axial image 61 of series 2) there is a 3 mm calculus which is associated with mild proximal right hydroureteronephrosis indicating mild right-sided obstruction. No suspicious renal lesions are noted. No left hydroureteronephrosis. Urinary bladder is normal in appearance. Bilateral adrenal glands are normal in appearance. Stomach/Bowel: The appearance of the stomach is normal. No pathologic dilatation of small bowel or colon. The appendix is not confidently identified and may be surgically absent. Regardless, there are no inflammatory changes noted adjacent to the cecum to suggest the presence of an acute appendicitis at this time. Vascular/Lymphatic: There is a stent extending from the left common femoral vein to the left common iliac vein just before the confluence with the inferior vena cava. 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 Attenuation within the lumen of the stent is slightly lower than the contralateral right-sided pelvic venous system, which could be related to slow flow or partial thrombosis. Associated with this, there are numerous dilated venous collaterals in the lower left hemipelvis, particularly in the left labial region. Reproductive: Bilateral tubal ligation clips are noted. Uterus and ovaries are unremarkable in appearance. Other: No significant volume of ascites.  No pneumoperitoneum. Musculoskeletal: There are no aggressive appearing lytic or blastic lesions noted in the visualized portions of the skeleton. IMPRESSION: 1. 3 mm obstructive calculus in the distal third of the right ureter with mild proximal right hydroureteronephrosis. 2. Additional nonobstructive calculi are noted within the collecting systems of both kidneys measuring up to 3 mm in the lower pole collecting system of the left kidney. 3. 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 This may be complete or incomplete (favored to be incomplete based on today's examination). 4. Chronic scarring in the lung bases bilaterally (left-greater-than-right). 5. Additional incidental findings, as above. Electronically Signed   By: Vinnie Langton M.D.   On: 04/04/2022 06:27    Procedures Procedures    Medications Ordered in ED Medications  sodium chloride (PF) 0.9 % injection (  Not Given 04/04/22 0605)  ondansetron (ZOFRAN) injection 4 mg (4 mg Intravenous Given 04/04/22 0234)  morphine (PF) 4 MG/ML injection 4 mg (4 mg Intravenous Given 04/04/22 0234)  iohexol (OMNIPAQUE) 300 MG/ML solution 100 mL (100 mLs Intravenous Contrast Given 04/04/22 0558)  ketorolac (TORADOL) 30 MG/ML injection 30 mg (30 mg Intravenous Given 04/04/22 0533)    ED Course/ Medical Decision Making/ A&P Clinical Course as of 04/04/22 0642  Wed Apr 04, 2022  0327 I-Stat beta hCG blood, ED Patient is not pregnant, not a ruptured ectopic pregnancy [HS]  0327 Lipase, blood Lipase unremarkable, not consistent with pancreatitis [HS]  0327 Comprehensive metabolic panel(!) Mild hyperglycemia at 102.  Patient's AST and ALT are slightly elevated in the 70s but no AKI.  Additionally patient is status post cholecystectomy. [HS]  0327 CBC with Differential No leukocytosis, no anemia [HS]  0435 Urinalysis, Routine w reflex microscopic(!) UA notable for trace hematuria, greater than 50 red blood cells seen despite patient's lack of hematuria.  No signs to be stress of UTI, there is some budding yeast which could suggest most likely contamination given she is not having any dysuria.  Do not think this is a UTI or pyelonephritis. [HS]  0526 CAT scan is pending, patient requested reevaluation.  The pain is returned.  The pain is also now to the right flank.   Will order Toradol.  This seems more likely nephrolithiasis vs. pyelonephritis vs. appendicitis.   [HS]    Clinical Course User Index [HS] Sherrill Raring, PA-C                           Medical Decision Making Amount and/or Complexity of Data Reviewed Labs: ordered. Decision-making details documented in ED Course. Radiology: ordered.  Risk Prescription drug management.   Patient presents due to right lower quadrant pain.  Differential includes but not limited to ruptured Pregnancy, appendicitis, ovarian cyst, ovarian torsion, UTI, pyelonephritis, nephrolithiasis.  On exam she is right lower quadrant tenderness, there is no CVA tenderness or flank pain.  Lungs are clear to auscultation, not tachycardic or hypoxic.  She is on cardiac monitoring and is in sinus rhythm with a rate of 76.  She does have a history of frequent nephrolithiasis but she has her appendix in place.  We will proceed with CT to evaluate for possible appendicitis.  I ordered and viewed and interpreted laboratory work-up as documented in ED course.  I viewed the CT scan of her abdomen.  Her appendix is normal, ovaries are normal.  There is evidence of a 3 mm stone in the distal ureter with some mild hydronephrosis.  This is consistent with her presentation, will discharge with pain medicine, Zofran and Flomax.  Urology follow-up provided.  Considered admission but given size of stone is relatively small, her vitals are stable and she is well managed regarding her pain and I do not think she needs additional work-up or admission at this time.        Final Clinical Impression(s) / ED Diagnoses Final diagnoses:  Nephrolithiasis    Rx / DC Orders ED Discharge Orders          Ordered    tamsulosin (FLOMAX) 0.4 MG CAPS capsule  Daily after supper        04/04/22 0635    ondansetron (ZOFRAN-ODT) 8 MG disintegrating tablet  Every 8 hours PRN        04/04/22 0635    oxyCODONE-acetaminophen (PERCOCET/ROXICET) 5-325 MG  tablet  Every 6 hours PRN        04/04/22 0635              Sherrill Raring, PA-C 04/04/22 2876    Dina Rich, Barbette Hair, MD 04/10/22 [phone]

## 2022-04-04 NOTE — Discharge Instructions (Signed)
You have a kidney stone. Call to follow up with urology. Take flomax in the evenings after dinner. Take zofran for nausea and vomiting. Take percocet for severe pain, take motrin for breakthrough pain. Return for fevers, vomiting, new symptoms.

## 2022-04-05 MED ORDER — HYDROMORPHONE HCL 1 MG/ML IJ SOLN
1.0000 mg | Freq: Once | INTRAMUSCULAR | Status: AC
  Administered 2022-04-05: 1 mg via INTRAVENOUS
  Filled 2022-04-05: qty 1

## 2022-04-05 MED ORDER — ONDANSETRON HCL 4 MG/2ML IJ SOLN
4.0000 mg | Freq: Once | INTRAMUSCULAR | Status: AC
Start: 2022-04-05 — End: 2022-04-05
  Administered 2022-04-05: 4 mg via INTRAVENOUS
  Filled 2022-04-05: qty 2

## 2022-04-05 MED ORDER — KETOROLAC TROMETHAMINE 30 MG/ML IJ SOLN
30.0000 mg | Freq: Once | INTRAMUSCULAR | Status: AC
  Administered 2022-04-05: 30 mg via INTRAVENOUS
  Filled 2022-04-05: qty 1

## 2022-04-05 NOTE — Discharge Instructions (Signed)
Make sure to pick up your prescriptions from the pharmacy today, take as directed. Follow-up with your primary care doctor or the urologist. Return here for new concerns.

## 2022-04-05 NOTE — ED Provider Notes (Signed)
[facility] DEPT Provider Note   CSN: [id number] Arrival date & time: 04/04/22  2338     History  Chief Complaint  Patient presents with   Flank Pain    Paula Warner is a 36 y.o. female.  The history is provided by the patient and medical records.  Flank Pain   36 year old female presenting to the ED with right flank pain.  She was seen in this ED last night and diagnosed with a right-sided kidney stone.  She was discharged home with pain and nausea medication, however did not pick up any of her medications today as she stated "I could not get to the pharmacy".  She reports she has been in pain and nauseated all day with intermittent vomiting.  She has not had any fever.  She is still urinating normally.  Home Medications Prior to Admission medications   Medication Sig Start Date End Date Taking? Authorizing Provider  diclofenac Sodium (VOLTAREN) 1 % GEL Apply 2 g topically 4 (four) times daily. Patient not taking: Reported on 03/22/2022 12/13/21   Long, Wonda Olds, MD  Drospirenone (SLYND) 4 MG TABS Take 1 tablet by mouth daily. Patient not taking: Reported on 03/22/2022 05/09/21   Constant, Peggy, MD  medroxyPROGESTERone (PROVERA) 10 MG tablet Take 1 tablet (10 mg total) by mouth daily. Patient not taking: Reported on 05/09/2021 04/11/20   Leftwich-Kirby, Kathie Dike, CNM  norethindrone (MICRONOR) 0.35 MG tablet Take 1 tablet (0.35 mg total) by mouth daily. Patient not taking: Reported on 03/22/2022 05/09/21   Constant, Peggy, MD  ondansetron (ZOFRAN-ODT) 8 MG disintegrating tablet Take 1 tablet (8 mg total) by mouth every 8 (eight) hours as needed for nausea or vomiting. 04/04/22   Sherrill Raring, PA-C  oxyCODONE-acetaminophen (PERCOCET/ROXICET) 5-325 MG tablet Take 1 tablet by mouth every 6 (six) hours as needed for severe pain. 04/04/22   Sherrill Raring, PA-C  potassium chloride SA (KLOR-CON M) 20 MEQ tablet Take 1 tablet (20 mEq total) by mouth 2 (two) times daily  for 5 days. 03/22/22 03/27/22  Loni Beckwith, PA-C  tamsulosin (FLOMAX) 0.4 MG CAPS capsule Take 1 capsule (0.4 mg total) by mouth daily after supper. 04/04/22   Sherrill Raring, PA-C  terbinafine (LAMISIL) 250 MG tablet Take 1 tablet (250 mg total) by mouth daily. Patient not taking: Reported on 03/22/2022 06/28/20   Trula Slade, DPM      Allergies    Zinacef [cefuroxime]    Review of Systems   Review of Systems  Gastrointestinal:  Positive for nausea and vomiting.  Genitourinary:  Positive for flank pain.  All other systems reviewed and are negative.   Physical Exam Updated Vital Signs BP 127/85   Pulse 92   Temp 98.1 F (36.7 C) (Oral)   Resp 18   Ht 5\' 1"  (1.549 m)   Wt 68.5 kg   LMP 03/15/2022 Comment: negative HCG  04/04/22  SpO2 98%   BMI 28.53 kg/m  Physical Exam Vitals and nursing note reviewed.  Constitutional:      Appearance: She is well-developed.     Comments: Curled up onto stretcher, appears uncomfortable  HENT:     Head: Normocephalic and atraumatic.  Eyes:     Conjunctiva/sclera: Conjunctivae normal.     Pupils: Pupils are equal, round, and reactive to light.  Cardiovascular:     Rate and Rhythm: Normal rate and regular rhythm.     Heart sounds: Normal heart sounds.  Pulmonary:  Effort: Pulmonary effort is normal. No respiratory distress.     Breath sounds: Normal breath sounds. No rhonchi.  Abdominal:     General: Bowel sounds are normal.     Palpations: Abdomen is soft.     Tenderness: There is no abdominal tenderness. There is no rebound.  Musculoskeletal:        General: Normal range of motion.     Cervical back: Normal range of motion.  Skin:    General: Skin is warm and dry.  Neurological:     Mental Status: She is alert and oriented to person, place, and time.     ED Results / Procedures / Treatments   Labs (all labs ordered are listed, but only abnormal results are displayed) Labs Reviewed - No data to  display  EKG None  Radiology CT Abdomen Pelvis W Contrast  Result Date: 04/04/2022 CLINICAL DATA:  36 year old female with history of right lower quadrant abdominal pain for several hours. EXAM: CT ABDOMEN AND PELVIS WITH CONTRAST TECHNIQUE: Multidetector CT imaging of the abdomen and pelvis was performed using the standard protocol following bolus administration of intravenous contrast. RADIATION DOSE REDUCTION: This exam was performed according to the departmental dose-optimization program which includes automated exposure control, adjustment of the mA and/or kV according to patient size and/or use of iterative reconstruction technique. CONTRAST:  166mL OMNIPAQUE IOHEXOL 300 MG/ML  SOLN COMPARISON:  CT the abdomen and pelvis 03/22/2022. FINDINGS: Lower chest: Chronic scarring in the lung bases bilaterally (left-greater-than-right). Hepatobiliary: No suspicious cystic or solid hepatic lesions. No intra or extrahepatic biliary ductal dilatation. Status post cholecystectomy. Pancreas: No pancreatic mass. No pancreatic ductal dilatation. No pancreatic or peripancreatic fluid collections or inflammatory changes. Spleen: Unremarkable. Adrenals/Urinary Tract: Multiple nonobstructive calculi are noted within the collecting systems of both kidneys measuring up to 3 mm in the lower pole collecting system of the right kidney. In addition, in the distal third of the right ureter (axial image 61 of series 2) there is a 3 mm calculus which is associated with mild proximal right hydroureteronephrosis indicating mild right-sided obstruction. No suspicious renal lesions are noted. No left hydroureteronephrosis. Urinary bladder is normal in appearance. Bilateral adrenal glands are normal in appearance. Stomach/Bowel: The appearance of the stomach is normal. No pathologic dilatation of small bowel or colon. The appendix is not confidently identified and may be surgically absent. Regardless, there are no inflammatory changes  noted adjacent to the cecum to suggest the presence of an acute appendicitis at this time. Vascular/Lymphatic: There is a stent extending from the left common femoral vein to the left common iliac vein just before the confluence with the inferior vena cava. 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 Attenuation within the lumen of the stent is slightly lower than the contralateral right-sided pelvic venous system, which could be related to slow flow or partial thrombosis. Associated with this, there are numerous dilated venous collaterals in the lower left hemipelvis, particularly in the left labial region. Reproductive: Bilateral tubal ligation clips are noted. Uterus and ovaries are unremarkable in appearance. Other: No significant volume of ascites.  No pneumoperitoneum. Musculoskeletal: There are no aggressive appearing lytic or blastic lesions noted in the visualized portions of the skeleton. IMPRESSION: 1. 3 mm obstructive calculus in the distal third of the right ureter with mild proximal right hydroureteronephrosis. 2. Additional nonobstructive calculi are noted within the collecting systems of both kidneys  measuring up to 3 mm in the lower pole collecting system of the left kidney. 3. 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 This may be complete or incomplete (favored to be incomplete based on today's examination). 4. Chronic scarring in the lung bases bilaterally (left-greater-than-right). 5. Additional incidental findings, as above. Electronically Signed   By: Vinnie Langton M.D.   On: 04/04/2022 06:27    Procedures Procedures    Medications Ordered in ED Medications  ketorolac (TORADOL) 30 MG/ML injection 30 mg (30 mg Intravenous Given 04/05/22  0308)  HYDROmorphone (DILAUDID) injection 1 mg (1 mg Intravenous Given 04/05/22 0308)  ondansetron (ZOFRAN) injection 4 mg (4 mg Intravenous Given 04/05/22 0308)    ED Course/ Medical Decision Making/ A&P                           Medical Decision Making Risk Prescription drug management.   36 year old female here with right flank pain.  She was seen here late last night and discharged early this morning, found to have right-sided kidney stone.  She did not pick up any of her medications at the pharmacy today as she stated she "could not get there".  She reports ongoing pain, nausea, and vomiting throughout the day today.  She is afebrile and nontoxic but does appear uncomfortable.  Results reviewed from yesterday, normal renal function, does have 3 mm distal right ureteral calculus.  We will give pain control.  3:55 AM No pain after medications here in the ED.  Little drowsy currently.  Will let metabolize prior to discharge.  Will need to follow-up with PCP and/or urology.  Can pick up prescriptions from pharmacy, take as directed.  Can return here for new concerns.  Final Clinical Impression(s) / ED Diagnoses Final diagnoses:  Right flank pain    Rx / DC Orders ED Discharge Orders     None         Larene Pickett, PA-C 04/05/22 0457    Maudie Flakes, MD 04/05/22 [phone]

## 2022-04-09 ENCOUNTER — Other Ambulatory Visit: Payer: Self-pay | Admitting: Urology

## 2022-04-09 NOTE — Patient Instructions (Signed)
DUE TO SPACE LIMITATIONS, ONLY TWO VISITORS  (aged 34 and older) ARE ALLOWED TO COME WITH YOU AND STAY IN THE WAITING ROOM DURING YOUR PRE OP AND PROCEDURE.   **NO VISITORS ARE ALLOWED IN THE SHORT STAY AREA OR RECOVERY ROOM!!**  You are not required to quarantine at this time prior to your surgery. However, you must do this: Hand Hygiene often Do NOT share personal items Notify your provider if you are in close contact with someone who has COVID or you develop fever 100.4 or greater, new onset of sneezing, cough, sore throat, shortness of breath or body aches.       Your procedure is scheduled on:  Wednesday April 11, 2022  Report to [facility] Main Entrance.  Report to admitting at:  1:15 pm  +++++Call this number if you have any questions or problems the morning of surgery [phone]  Do not eat food :After Midnight the night prior to your surgery/procedure.  After Midnight you may have the following liquids until   12:30 PM DAY OF SURGERY  Clear Liquid Diet Water Black Coffee (sugar ok, NO MILK/CREAM OR CREAMERS)  Tea (sugar ok, NO MILK/CREAM OR CREAMERS) regular and decaf                             Plain Jell-O (NO RED)                                           Fruit ices (not with fruit pulp, NO RED)                                     Popsicles (NO RED)                                                                  Juice: apple, WHITE grape, WHITE cranberry Sports drinks like Gatorade (NO RED) Clear broth(vegetable,chicken,beef)         FOLLOW ANY ADDITIONAL PRE OP INSTRUCTIONS YOU RECEIVED FROM YOUR SURGEON'S OFFICE!!!   Oral Hygiene is also important to reduce your risk of infection.        Remember - BRUSH YOUR TEETH THE MORNING OF SURGERY WITH YOUR REGULAR TOOTHPASTE  Take ONLY these medicines the morning of surgery with A SIP OF WATER:   No Medicine                 You may not have any metal on your body including hair pins, jewelry, and body  piercing  Do not wear make-up, lotions, powders, perfumes, or deodorant  Do not wear nail polish including gel and S&S, artificial / acrylic nails, or any other type of covering on natural nails including finger and toenails. If you have artificial nails, gel coating, etc., that needs to be removed by a nail salon, Please have this removed prior to surgery. Not doing so may mean that your surgery could be cancelled or delayed if the Surgeon or anesthesia staff feels like they are unable to monitor you safely.  Do not shave 48 hours prior to surgery to avoid nicks in your skin which may contribute to postoperative infections.   DO NOT [place]. PHARMACY WILL DISPENSE MEDICATIONS LISTED ON YOUR MEDICATION LIST TO YOU DURING YOUR ADMISSION [place]!   Patients discharged on the day of surgery will not be allowed to drive home.  Someone NEEDS to stay with you for the first 24 hours after anesthesia.  Special Instructions: Bring a copy of your healthcare power of attorney and living will documents the day of surgery, if you wish to have them scanned into your [facility] Records- EPIC  Please read over the following fact sheets you were given: IF YOU HAVE QUESTIONS ABOUT YOUR PRE-OP INSTRUCTIONS, PLEASE CALL [phone]  ([place])   [place] - Preparing for Surgery Before surgery, you can play an important role.  Because skin is not sterile, your skin needs to be as free of germs as possible.  You can reduce the number of germs on your skin by washing with CHG (chlorahexidine gluconate) soap before surgery.  CHG is an antiseptic cleaner which kills germs and bonds with the skin to continue killing germs even after washing. Please DO NOT use if you have an allergy to CHG or antibacterial soaps.  If your skin becomes reddened/irritated stop using the CHG and inform your nurse when you arrive at Short Stay. Do not shave (including legs and underarms) for  at least 48 hours prior to the first CHG shower.  You may shave your face/neck.  Please follow these instructions carefully:  1.  Shower with CHG Soap the night before surgery and the  morning of surgery.  2.  If you choose to wash your hair, wash your hair first as usual with your normal  shampoo.  3.  After you shampoo, rinse your hair and body thoroughly to remove the shampoo.                             4.  Use CHG as you would any other liquid soap.  You can apply chg directly to the skin and wash.  Gently with a scrungie or clean washcloth.  5.  Apply the CHG Soap to your body ONLY FROM THE NECK DOWN.   Do not use on face/ open                           Wound or open sores. Avoid contact with eyes, ears mouth and genitals (private parts).                       Wash face,  Genitals (private parts) with your normal soap.             6.  Wash thoroughly, paying special attention to the area where your  surgery  will be performed.  7.  Thoroughly rinse your body with warm water from the neck down.  8.  DO NOT shower/wash with your normal soap after using and rinsing off the CHG Soap.            9.  Pat yourself dry with a clean towel.            10.  Wear clean pajamas.            11.  Place clean sheets on your bed the night  of your first shower and do not  sleep with pets.  ON THE DAY OF SURGERY : Do not apply any lotions/deodorants the morning of surgery.  Please wear clean clothes to the hospital/surgery center.    FAILURE TO FOLLOW THESE INSTRUCTIONS MAY RESULT IN THE CANCELLATION OF YOUR SURGERY  PATIENT SIGNATURE_________________________________  NURSE SIGNATURE__________________________________  ________________________________________________________________________

## 2022-04-09 NOTE — Progress Notes (Signed)
COVID Vaccine received:  [x]  No []  Yes  Date of any COVID positive Test in last 90 days: None  PCP - Kristie Cowman, MD Cardiologist - none  Chest x-ray - 12-13-21  Epic EKG -  12-15-21  Epic Stress Test - N/A ECHO - N/A Cardiac Cath - N/A  History of Sleep Apnea? [x]  No []  Yes  []  unknown  Does the patient monitor blood sugar? 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  Xarelto stopped in 2017; [facility] still follows her.  Activity level:  Patient can go up a flight of stairs and perform activities of daily living without stopping and without symptoms of chest pain or shortness of breath. She is able to do exercises without symptoms. Patient able to complete ADLs without assistance    Comments: Patient is taking no medications at this time.   Anesthesia review:   Patient denies shortness of breath, fever, cough and chest pain at PAT appointment  Patient verbalized understanding and agreement to the Pre-Surgical Instructions that were given to them at this PAT appointment. Patient was also educated of the need to review these PAT instructions again prior to his/her surgery.I reviewed the appropriate phone numbers to call if they have any and questions or concerns.

## 2022-04-10 ENCOUNTER — Other Ambulatory Visit: Payer: Self-pay

## 2022-04-10 ENCOUNTER — Encounter (HOSPITAL_COMMUNITY): Payer: Self-pay

## 2022-04-10 ENCOUNTER — Encounter (HOSPITAL_COMMUNITY)
Admission: RE | Admit: 2022-04-10 | Discharge: 2022-04-10 | Disposition: A | Discharge status: Home / Self Care | Payer: Commercial Managed Care - HMO | Source: Ambulatory Visit | Attending: Urology | Admitting: Urology

## 2022-04-10 VITALS — BP 123/85 | HR 69 | Temp 98.9°F | Resp 16 | Ht 61.0 in | Wt 147.0 lb

## 2022-04-10 DIAGNOSIS — Z01818 Encounter for other preprocedural examination: Secondary | ICD-10-CM | POA: Diagnosis present

## 2022-04-10 HISTORY — DX: Compression of vein: I87.1

## 2022-04-11 ENCOUNTER — Ambulatory Visit (HOSPITAL_COMMUNITY): Payer: Commercial Managed Care - HMO | Admitting: Certified Registered Nurse Anesthetist

## 2022-04-11 ENCOUNTER — Ambulatory Visit (HOSPITAL_BASED_OUTPATIENT_CLINIC_OR_DEPARTMENT_OTHER): Payer: Commercial Managed Care - HMO | Admitting: Certified Registered Nurse Anesthetist

## 2022-04-11 ENCOUNTER — Ambulatory Visit (HOSPITAL_COMMUNITY)
Admission: RE | Admit: 2022-04-11 | Discharge: 2022-04-11 | Disposition: A | Discharge status: Home / Self Care | Payer: Commercial Managed Care - HMO | Source: Ambulatory Visit | Attending: Urology | Admitting: Urology

## 2022-04-11 ENCOUNTER — Other Ambulatory Visit: Payer: Self-pay

## 2022-04-11 ENCOUNTER — Encounter (HOSPITAL_COMMUNITY): Payer: Self-pay | Admitting: Urology

## 2022-04-11 ENCOUNTER — Ambulatory Visit (HOSPITAL_COMMUNITY): Payer: Commercial Managed Care - HMO

## 2022-04-11 ENCOUNTER — Encounter (HOSPITAL_COMMUNITY)
Admission: RE | Disposition: A | Discharge status: Home / Self Care | Payer: Self-pay | Source: Ambulatory Visit | Attending: Urology

## 2022-04-11 DIAGNOSIS — Z01818 Encounter for other preprocedural examination: Secondary | ICD-10-CM

## 2022-04-11 DIAGNOSIS — N200 Calculus of kidney: Secondary | ICD-10-CM | POA: Diagnosis present

## 2022-04-11 DIAGNOSIS — N202 Calculus of kidney with calculus of ureter: Secondary | ICD-10-CM | POA: Diagnosis not present

## 2022-04-11 DIAGNOSIS — Z86718 Personal history of other venous thrombosis and embolism: Secondary | ICD-10-CM | POA: Diagnosis not present

## 2022-04-11 HISTORY — PX: CYSTOSCOPY WITH RETROGRADE PYELOGRAM, URETEROSCOPY AND STENT PLACEMENT: SHX5789

## 2022-04-11 LAB — POCT PREGNANCY, URINE: Preg Test, Ur: NEGATIVE

## 2022-04-11 SURGERY — CYSTOURETEROSCOPY, WITH RETROGRADE PYELOGRAM AND STENT INSERTION
Anesthesia: General | Site: Vagina | Laterality: Bilateral

## 2022-04-11 MED ORDER — OXYCODONE HCL 5 MG/5ML PO SOLN: 5.0000 mg | Freq: Once | ORAL | Status: DC | PRN

## 2022-04-11 MED ORDER — LIDOCAINE HCL (PF) 2 % IJ SOLN
INTRAMUSCULAR | Status: AC
  Filled 2022-04-11: qty 5

## 2022-04-11 MED ORDER — ACETAMINOPHEN 500 MG PO TABS
1000.0000 mg | ORAL_TABLET | Freq: Once | ORAL | Status: AC
  Administered 2022-04-11: 1000 mg via ORAL
  Filled 2022-04-11: qty 2

## 2022-04-11 MED ORDER — PROPOFOL 10 MG/ML IV BOLUS
INTRAVENOUS | Status: DC | PRN
  Administered 2022-04-11: 200 mg via INTRAVENOUS

## 2022-04-11 MED ORDER — ONDANSETRON HCL 4 MG/2ML IJ SOLN: 4.0000 mg | Freq: Once | INTRAMUSCULAR | Status: DC | PRN

## 2022-04-11 MED ORDER — 0.9 % SODIUM CHLORIDE (POUR BTL) OPTIME
TOPICAL | Status: DC | PRN
  Administered 2022-04-11: 1000 mL

## 2022-04-11 MED ORDER — CIPROFLOXACIN IN D5W 400 MG/200ML IV SOLN
INTRAVENOUS | Status: DC | PRN
  Administered 2022-04-11: 400 mg via INTRAVENOUS

## 2022-04-11 MED ORDER — AMISULPRIDE (ANTIEMETIC) 5 MG/2ML IV SOLN: 10.0000 mg | Freq: Once | INTRAVENOUS | Status: DC | PRN

## 2022-04-11 MED ORDER — KETOROLAC TROMETHAMINE 10 MG PO TABS: 10.0000 mg | ORAL_TABLET | Freq: Three times a day (TID) | ORAL | 0 refills | Status: DC | PRN

## 2022-04-11 MED ORDER — LACTATED RINGERS IV SOLN: INTRAVENOUS | Status: DC

## 2022-04-11 MED ORDER — SCOPOLAMINE 1 MG/3DAYS TD PT72
1.0000 | MEDICATED_PATCH | TRANSDERMAL | Status: DC
  Administered 2022-04-11: 1.5 mg via TRANSDERMAL
  Filled 2022-04-11: qty 1

## 2022-04-11 MED ORDER — HYDROMORPHONE HCL 1 MG/ML IJ SOLN: 0.2500 mg | INTRAMUSCULAR | Status: DC | PRN

## 2022-04-11 MED ORDER — ORAL CARE MOUTH RINSE
15.0000 mL | Freq: Once | OROMUCOSAL | Status: AC
  Administered 2022-04-11: 15 mL via OROMUCOSAL

## 2022-04-11 MED ORDER — MEPERIDINE HCL 50 MG/ML IJ SOLN: 6.2500 mg | INTRAMUSCULAR | Status: DC | PRN

## 2022-04-11 MED ORDER — MIDAZOLAM HCL 2 MG/2ML IJ SOLN
INTRAMUSCULAR | Status: AC
  Filled 2022-04-11: qty 2

## 2022-04-11 MED ORDER — ONDANSETRON HCL 4 MG/2ML IJ SOLN
INTRAMUSCULAR | Status: AC
  Filled 2022-04-11: qty 2

## 2022-04-11 MED ORDER — OXYCODONE-ACETAMINOPHEN 5-325 MG PO TABS: 1.0000 | ORAL_TABLET | Freq: Four times a day (QID) | ORAL | 0 refills | Status: DC | PRN

## 2022-04-11 MED ORDER — IOHEXOL 300 MG/ML  SOLN
INTRAMUSCULAR | Status: DC | PRN
  Administered 2022-04-11: 25 mL

## 2022-04-11 MED ORDER — ONDANSETRON HCL 4 MG/2ML IJ SOLN
INTRAMUSCULAR | Status: DC | PRN
  Administered 2022-04-11: 4 mg via INTRAVENOUS

## 2022-04-11 MED ORDER — FENTANYL CITRATE (PF) 100 MCG/2ML IJ SOLN
INTRAMUSCULAR | Status: DC | PRN
  Administered 2022-04-11 (×2): 50 ug via INTRAVENOUS

## 2022-04-11 MED ORDER — OXYCODONE HCL 5 MG PO TABS: 5.0000 mg | ORAL_TABLET | Freq: Once | ORAL | Status: DC | PRN

## 2022-04-11 MED ORDER — DEXAMETHASONE SODIUM PHOSPHATE 10 MG/ML IJ SOLN
INTRAMUSCULAR | Status: DC | PRN
  Administered 2022-04-11: 5 mg via INTRAVENOUS

## 2022-04-11 MED ORDER — SODIUM CHLORIDE 0.9 % IR SOLN
Status: DC | PRN
  Administered 2022-04-11: 3000 mL

## 2022-04-11 MED ORDER — FENTANYL CITRATE (PF) 100 MCG/2ML IJ SOLN
INTRAMUSCULAR | Status: AC
  Filled 2022-04-11: qty 2

## 2022-04-11 MED ORDER — CIPROFLOXACIN IN D5W 400 MG/200ML IV SOLN
INTRAVENOUS | Status: AC
  Filled 2022-04-11: qty 200

## 2022-04-11 MED ORDER — CHLORHEXIDINE GLUCONATE 0.12 % MT SOLN: 15.0000 mL | Freq: Once | OROMUCOSAL | Status: AC

## 2022-04-11 MED ORDER — MIDAZOLAM HCL 2 MG/2ML IJ SOLN
INTRAMUSCULAR | Status: DC | PRN
  Administered 2022-04-11: 2 mg via INTRAVENOUS

## 2022-04-11 MED ORDER — KETOROLAC TROMETHAMINE 30 MG/ML IJ SOLN: 30.0000 mg | Freq: Once | INTRAMUSCULAR | Status: DC | PRN

## 2022-04-11 MED ORDER — LIDOCAINE HCL (CARDIAC) PF 100 MG/5ML IV SOSY
PREFILLED_SYRINGE | INTRAVENOUS | Status: DC | PRN
  Administered 2022-04-11: 60 mg via INTRAVENOUS

## 2022-04-11 MED ORDER — DEXAMETHASONE SODIUM PHOSPHATE 10 MG/ML IJ SOLN
INTRAMUSCULAR | Status: AC
  Filled 2022-04-11: qty 1

## 2022-04-11 MED ORDER — KETOROLAC TROMETHAMINE 30 MG/ML IJ SOLN
INTRAMUSCULAR | Status: DC | PRN
  Administered 2022-04-11: 30 mg via INTRAVENOUS

## 2022-04-11 MED ORDER — PHENYLEPHRINE HCL (PRESSORS) 10 MG/ML IV SOLN
INTRAVENOUS | Status: DC | PRN
  Administered 2022-04-11: 80 ug via INTRAVENOUS

## 2022-04-11 MED ORDER — PROPOFOL 10 MG/ML IV BOLUS
INTRAVENOUS | Status: AC
Start: 2022-04-11 — End: ?
  Filled 2022-04-11: qty 20

## 2022-04-11 SURGICAL SUPPLY — 25 items
BAG URO CATCHER STRL LF (MISCELLANEOUS) ×3 IMPLANT
BASKET LASER NITINOL 1.9FR (BASKET) ×2 IMPLANT
CATH URETL OPEN END 6FR 70 (CATHETERS) ×3 IMPLANT
CLOTH BEACON ORANGE TIMEOUT ST (SAFETY) ×3 IMPLANT
EXTRACTOR STONE 1.7FRX115CM (UROLOGICAL SUPPLIES) IMPLANT
GLOVE SURG LX 7.5 STRW (GLOVE) ×1
GLOVE SURG LX STRL 7.5 STRW (GLOVE) ×2 IMPLANT
GOWN SRG XL LVL 4 BRTHBL STRL (GOWNS) ×2 IMPLANT
GOWN STRL NON-REIN XL LVL4 (GOWNS) ×1
GUIDEWIRE ANG ZIPWIRE 038X150 (WIRE) ×3 IMPLANT
GUIDEWIRE STR DUAL SENSOR (WIRE) ×3 IMPLANT
KIT TURNOVER KIT A (KITS) IMPLANT
LASER FIB FLEXIVA PULSE ID 365 (Laser) IMPLANT
LASER FIB FLEXIVA PULSE ID 550 (Laser) IMPLANT
LASER FIB FLEXIVA PULSE ID 910 (Laser) IMPLANT
MANIFOLD NEPTUNE II (INSTRUMENTS) ×3 IMPLANT
PACK CYSTO (CUSTOM PROCEDURE TRAY) ×3 IMPLANT
SHEATH NAVIGATOR HD 11/13X28 (SHEATH) ×2 IMPLANT
SHEATH NAVIGATOR HD 11/13X36 (SHEATH) IMPLANT
STENT POLARIS 5FRX22 (STENTS) ×4 IMPLANT
TRACTIP FLEXIVA PULS ID 200XHI (Laser) IMPLANT
TRACTIP FLEXIVA PULSE ID 200 (Laser)
TUBE FEEDING 8FR 16IN STR KANG (MISCELLANEOUS) ×3 IMPLANT
TUBING CONNECTING 10 (TUBING) ×3 IMPLANT
TUBING UROLOGY SET (TUBING) ×3 IMPLANT

## 2022-04-11 NOTE — Brief Op Note (Signed)
04/11/2022  4:02 PM  PATIENT:  Paula Warner  36 y.o. female  PRE-OPERATIVE DIAGNOSIS:  RIGHT URETERAL AND BILATERAL RENAL STONES  POST-OPERATIVE DIAGNOSIS:  RIGHT URETERAL AND BILATERAL RENAL STONES  PROCEDURE:  Procedure(s) with comments: CYSTOSCOPY WITH RETROGRADE PYELOGRAM, URETEROSCOPY BASKETING OF STONE AND STENT PLACEMENT (Bilateral) - 1 HR  SURGEON:  Surgeon(s) and Role:    * Alexis Frock, MD - Primary  PHYSICIAN ASSISTANT:   ASSISTANTS: none   ANESTHESIA:   general  EBL:  minimal   BLOOD ADMINISTERED:none  DRAINS: none   LOCAL MEDICATIONS USED:  NONE  SPECIMEN:  Source of Specimen:  bilateral renal stones  DISPOSITION OF SPECIMEN:   [facility] for compositional analysis  COUNTS:  YES  TOURNIQUET:  * No tourniquets in log *  DICTATION: .Other Dictation: Dictation Number [id number]  PLAN OF CARE: Discharge to home after PACU  PATIENT DISPOSITION:  PACU - hemodynamically stable.   Delay start of Pharmacological VTE agent (>24hrs) due to surgical blood loss or risk of bleeding: yes

## 2022-04-11 NOTE — Progress Notes (Signed)
85 Anesthesiologist ion to evaluate made aware that the patient passed two kidney stones one Saturday, 04-07-22 and one yesterday,04-10-22 and  she went to speak with Dr. Tresa Moore because he is in surgery and was unable to return a call to me.

## 2022-04-11 NOTE — Transfer of Care (Signed)
Immediate Anesthesia Transfer of Care Note  Patient: EVALYSE STROOPE  Procedure(s) Performed: CYSTOSCOPY WITH RETROGRADE PYELOGRAM, URETEROSCOPY BASKETING OF STONE AND STENT PLACEMENT (Bilateral: Vagina )  Patient Location: PACU  Anesthesia Type:General  Level of Consciousness: awake, alert , oriented and patient cooperative  Airway & Oxygen Therapy: Patient Spontanous Breathing  Post-op Assessment: Report given to RN and Post -op Vital signs reviewed and stable  Post vital signs: Reviewed and stable  Last Vitals:  Vitals Value Taken Time  BP 124/84 04/11/22 1610  Temp    Pulse 65 04/11/22 1612  Resp 14 04/11/22 1612  SpO2 96 % 04/11/22 1612  Vitals shown include unvalidated device data.  Last Pain:  Vitals:   04/11/22 1329  TempSrc:   PainSc: 0-No pain         Complications: No notable events documented.

## 2022-04-11 NOTE — Op Note (Signed)
NAME: Paula Warner, DEARING MEDICAL RECORD NO: [id number] ACCOUNT NO: [id number] DATE OF BIRTH: 04/19/1986 FACILITY: [facility] LOCATION: [facility] PHYSICIAN: Alexis Frock, MD  Operative Report   DATE OF PROCEDURE: 04/11/2022  DIAGNOSIS:  Right ureteral and bilateral renal stones.  PROCEDURE PERFORMED:   1.  Cystoscopy with bilateral retrograde pyelograms, interpretation. 2.  Bilateral ureteroscopy with basketing of stone. 3.  Insertion of bilateral ureteral stents.  ESTIMATED BLOOD LOSS:  Nil.  COMPLICATIONS:  None.  SPECIMENS: Bilateral renal stone fragments for composition analysis.  FINDINGS:   1.  Interval passage of right ureteral stone. 2.  Left lower pole renal stone. 3.  Multifocal right papillary tip calcifications. 4.  Complete resolution of all accessible stone fragments larger than one-third mm following basket extraction. 5.  Successful placement of bilateral ureteral stents, 5 x 22 Polaris with tether.  INDICATIONS:  The patient is a very pleasant 36 year old lady with history of recurrent urolithiasis times several.  She was found on workup of colicky flank pain to have a right distal ureteral stone and bilateral renal stones.  She does have  significant travel in the near future.  Options were discussed for management including continued medical therapy versus shockwave lithotripsy versus ureteroscopy unilateral versus bilateral. She wished to proceed with bilateral ureteroscopy with goal of  stone free.  She endorsed that she did pass one small stone fragment recently with a significant improvement in pain, likely corresponding to interval passage of a right distal ureteral stone.  I discussed with her preoperatively cancelling surgery versus proceeding with  goal of stone free to prevent future episodes of colic, especially during her travel and she wished to proceed.  Informed consent was obtained and placed in medical record.  PROCEDURE IN DETAIL:  The patient being  herself verified, procedure being bilateral ureteroscopy was confirmed.  Procedure timeout was performed.  Intravenous antibiotics were administered.  General anesthesia was induced.  The patient was placed into a  low lithotomy position.  Sterile field was created, prepped and draped the patient's vagina, introitus, and proximal thighs using iodine.  Cystourethroscopy was performed using 21-French rigid cystoscope with offset lens.  Inspection of urinary bladder  was unremarkable.  Left ureteral orifice was cannulated with a 6-French end-hole catheter, and a left retrograde pyelogram was obtained.  Left retrograde pyelogram demonstrated a single left ureter, single system left kidney.  No filling defects or narrowing noted.  A ZIPwire advanced to the level of lower pole, set aside as a safety wire, 8-French feeding tube placed in the urinary  bladder for pressure release.  Next, right retrograde pyelogram was performed.  Right retrograde pyelogram demonstrated single right ureter, single system right kidney.  No filling defects or narrowing noted.  A ZIPwire advanced to lower pole, set aside as a safety wire.  Notably, it did appear that she had passed a right ureteral  stone based on fluoroscopy.  She does have a left iliac stent placed, which is also seen on fluoroscopy.  Next, a rigid ureteroscopy performed of the distal four-fifths of the right ureter alongside a separate sensor working wire.  No mucosal  abnormalities were found and semirigid ureteroscopy performed of the distal four-fifths left ureter alongside a separate sensor working wire.  No mucosal abnormalities were found.  Semi-rigid scope was exchanged for a short length ureteral access sheath  to the level of proximal ureter and flexible digital ureteroscopy was performed of the proximal ureter and systematic inspection of left kidney including all calices x3.  There was a dominant calcification in the lower mid calix, did appear a bit   amenable to simple basketing.  It was grasped with the Escape basket and removed, set aside for composition analysis.  Following this, complete resolution of all accessible stone fragments larger than one-third mm.  Access sheath was removed under  continuous vision, no significant mucosal abnormalities were found.  Access sheath was then placed over the right sensor working wire to the level of proximal right ureter and flexible digital ureteroscopy was performed of the proximal right ureter and  systematic inspection of the right kidney, including all calices x3.  There was multifocal papillary tip calcifications noted in upper, mid and lower pole.  These were fortunately also amenable to simple basketing with the Escape basket and removed  sequentially and set aside for composition analysis.  Following this, complete resolution of all accessible stone fragments on the right side.  Access sheath was removed under continuous vision, no significant mucosal abnormalities were found.  Given  access sheath usage and bilateral procedure, it was felt that brief interval stenting with tethered stent would be most prudent.  As such, a new 5 x 22 Polaris type stents were placed over remaining safety wires using fluoroscopic guidance.  Good  proximal and distal planes were noted.  Tethers were fashioned together, trimmed to length and tucked per vagina and the procedure was terminated.      PAA D: 04/11/2022 4:07:53 pm T: 04/11/2022 10:46:00 pm  JOB: [id number]/ [id number]

## 2022-04-11 NOTE — Anesthesia Preprocedure Evaluation (Addendum)
Anesthesia Evaluation  Patient identified by MRN, date of birth, ID band Patient awake    Reviewed: Allergy & Precautions, NPO status , Patient's Chart, lab work & pertinent test results  Airway Mallampati: II  TM Distance: >3 FB Neck ROM: Full    Dental  (+) Teeth Intact, Dental Advisory Given   Pulmonary asthma ,    Pulmonary exam normal breath sounds clear to auscultation       Cardiovascular + DVT (2017)  Normal cardiovascular exam Rhythm:Regular Rate:Normal     Neuro/Psych negative neurological ROS  negative psych ROS   GI/Hepatic negative GI ROS, Neg liver ROS,   Endo/Other  negative endocrine ROS  Renal/GU negative Renal ROS  negative genitourinary   Musculoskeletal negative musculoskeletal ROS (+)   Abdominal   Peds  Hematology negative hematology ROS (+) Hb 13   Anesthesia Other Findings   Reproductive/Obstetrics negative OB ROS                            Anesthesia Physical Anesthesia Plan  ASA: 2  Anesthesia Plan: General   Post-op Pain Management: Tylenol PO (pre-op)* and Toradol IV (intra-op)*   Induction: Intravenous  PONV Risk Score and Plan: 3 and Ondansetron, Dexamethasone, Midazolam, Scopolamine patch - Pre-op and Treatment may vary due to age or medical condition  Airway Management Planned: LMA  Additional Equipment: None  Intra-op Plan:   Post-operative Plan: Extubation in OR  Informed Consent: I have reviewed the patients History and Physical, chart, labs and discussed the procedure including the risks, benefits and alternatives for the proposed anesthesia with the patient or authorized representative who has indicated his/her understanding and acceptance.     Dental advisory given  Plan Discussed with: CRNA  Anesthesia Plan Comments:         Anesthesia Quick Evaluation

## 2022-04-11 NOTE — Discharge Instructions (Addendum)
1 - You may have urinary urgency (bladder spasms) and bloody urine on / off with stent in place. This is normal.  2 -  Remove tethered stents on Thursday at around noon by pulling on strings, then blue-white plastic tubing, and discarding. Office is open THursday if problems arise.   3 - Call MD or go to ER for fever >102, severe pain / nausea / vomiting not relieved by medications, or acute change in medical status

## 2022-04-11 NOTE — H&P (Signed)
Paula Warner is an 36 y.o. female.    Chief Complaint: Pre-OP BILATERAL Ureteroscopic Stone Manipulation  HPI:   1 - Recurrent Urolithiasis -  Pre 2023 - medical passage x 3  03/2022 - 73mm Rt distal stone (below iliacs, above intramural) with mild hydro and bilateral <101mm renal stones.   PMH sig for DVT/leg stent (no blood thinners), BTL   Today " Paula Warner " is seen to proceed with BILATERAL ureteroscopic stone manipulation. NO interval fevers. She did pass one stone in interval with some significant improvement in pain.   Past Medical History:  Diagnosis Date   Asthma    History of asthma    no current med.   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   on anticoagulants for a period of time, but no longer on them   History of kidney stones    May-Thurner syndrome    Menstrual periods irregular     Past Surgical History:  Procedure Laterality Date   CESAREAN SECTION     x 2   CHOLECYSTECTOMY N/A 02/05/2020   Procedure: LAPAROSCOPIC CHOLECYSTECTOMY WITH POSSIBLE INTRAOPERATIVE CHOLANGIOGRAM;  Surgeon: Michael Boston, MD;  Location: [facility];  Service: General;  Laterality: N/A;   LAPAROSCOPIC TUBAL LIGATION Bilateral 05/08/2017   Procedure: LAPAROSCOPIC TUBAL LIGATION - FILSHIE CLIPS;  Surgeon: Emily Filbert, MD;  Location: [place];  Service: Gynecology;  Laterality: Bilateral;   LOWER EXTREMITY ANGIOGRAM Left 07/11/2016   Procedure: Percutaneous Venous Thrombectomy with IVUS and  with Stent Placement;  Surgeon: Serafina Mitchell, MD;  Location: [facility];  Service: Vascular;  Laterality: Left;   STENT PLACEMENT VASCULAR ([place] HX) Left 2017   L leg due to DVT   TUBAL LIGATION Bilateral     Family History  Problem Relation Age of Onset   Cancer Father    Clotting disorder Father    Heart disease Father    Heart murmur Mother    Asthma Mother    Social History:  reports that she has never smoked. She has never used smokeless tobacco. She reports that she  does not drink alcohol and does not use drugs.  Allergies:  Allergies  Allergen Reactions   Zinacef [Cefuroxime] Hives    No medications prior to admission.    No results found for this or any previous visit (from the past 48 hour(s)). No results found.  Review of Systems  Constitutional:  Negative for chills and fatigue.  Genitourinary:  Positive for flank pain.  All other systems reviewed and are negative.   Last menstrual period 03/15/2022. Physical Exam Vitals reviewed.  HENT:     Nose: Nose normal.  Eyes:     Pupils: Pupils are equal, round, and reactive to light.  Cardiovascular:     Rate and Rhythm: Normal rate.  Pulmonary:     Effort: Pulmonary effort is normal.  Abdominal:     General: Abdomen is flat.  Genitourinary:    Comments: Mild Rt CVAT at present Musculoskeletal:     Cervical back: Normal range of motion.  Skin:    General: Skin is warm.  Neurological:     Mental Status: She is alert.  Psychiatric:        Mood and Affect: Mood normal.      Assessment/Plan  We discussed options of cancelling surgery today as she likely passed the ureteral stone v. Proceed with goal of stone free as she does have bilateral renal stones as well. She wants to  proceed as she has severeal large trips upcoming and wants to avoid acute colic when away. Very reasonable.   Proceed as planned with BILATERAL ureteroscopic stone manipulation. RIsks, benefits, alternatives, expected peri-op course, including need for temporary stents as bilateral procedure, discussed previously and reiterated today.   Alexis Frock, MD 04/11/2022, 6:52 AM

## 2022-04-11 NOTE — Anesthesia Procedure Notes (Signed)
Procedure Name: LMA Insertion Date/Time: 04/11/2022 3:23 PM  Performed by: Raenette Rover, CRNAPre-anesthesia Checklist: Patient identified, Emergency Drugs available, Suction available and Patient being monitored Patient Re-evaluated:Patient Re-evaluated prior to induction Oxygen Delivery Method: Circle system utilized Preoxygenation: Pre-oxygenation with 100% oxygen Induction Type: IV induction LMA: LMA inserted LMA Size: 4.0 Number of attempts: 1 Placement Confirmation: positive ETCO2 and breath sounds checked- equal and bilateral Tube secured with: Tape Dental Injury: Teeth and Oropharynx as per pre-operative assessment

## 2022-04-11 NOTE — Anesthesia Postprocedure Evaluation (Signed)
Anesthesia Post Note  Patient: Paula Warner  Procedure(s) Performed: CYSTOSCOPY WITH RETROGRADE PYELOGRAM, URETEROSCOPY BASKETING OF STONE AND STENT PLACEMENT (Bilateral: Vagina )     Patient location during evaluation: PACU Anesthesia Type: General Level of consciousness: awake and alert, oriented and patient cooperative Pain management: pain level controlled Vital Signs Assessment: post-procedure vital signs reviewed and stable Respiratory status: spontaneous breathing, nonlabored ventilation and respiratory function stable Cardiovascular status: blood pressure returned to baseline and stable Postop Assessment: no apparent nausea or vomiting Anesthetic complications: no   No notable events documented.  Last Vitals:  Vitals:   04/11/22 1611 04/11/22 1615  BP: 124/84 (!) 129/102  Pulse: 69 65  Resp: 14 13  Temp: 36.8 C   SpO2: 96% 100%    Last Pain:  Vitals:   04/11/22 1611  TempSrc:   PainSc: 0-No pain                 Pervis Hocking

## 2022-04-11 NOTE — Progress Notes (Signed)
51 Paged Dr. Clint Lipps to make aware that the patient passed a kidney stone Saturday, 04-07-22 and yesterday, 04-10-21 and she is free of pain.  Patient reported she took a picture of the kidney stone on yesterday.  She didn't think about calling the doctor's office to report passing the kidney stones.

## 2022-04-12 ENCOUNTER — Encounter (HOSPITAL_COMMUNITY): Payer: Self-pay | Admitting: Urology

## 2022-05-14 DIAGNOSIS — H9202 Otalgia, left ear: Secondary | ICD-10-CM | POA: Diagnosis not present

## 2022-05-14 DIAGNOSIS — J309 Allergic rhinitis, unspecified: Secondary | ICD-10-CM | POA: Diagnosis not present

## 2022-05-14 DIAGNOSIS — K30 Functional dyspepsia: Secondary | ICD-10-CM | POA: Diagnosis not present

## 2022-05-14 DIAGNOSIS — Z6841 Body Mass Index (BMI) 40.0 and over, adult: Secondary | ICD-10-CM | POA: Diagnosis not present

## 2022-05-14 DIAGNOSIS — N3941 Urge incontinence: Secondary | ICD-10-CM | POA: Diagnosis not present

## 2022-05-22 DIAGNOSIS — Z3042 Encounter for surveillance of injectable contraceptive: Secondary | ICD-10-CM | POA: Diagnosis not present

## 2022-05-31 ENCOUNTER — Ambulatory Visit
Admission: EM | Admit: 2022-05-31 | Discharge: 2022-05-31 | Disposition: A | Discharge status: Home / Self Care | Payer: Commercial Managed Care - HMO | Attending: Physician Assistant | Admitting: Physician Assistant

## 2022-05-31 ENCOUNTER — Encounter: Payer: Self-pay | Admitting: Emergency Medicine

## 2022-05-31 DIAGNOSIS — J069 Acute upper respiratory infection, unspecified: Secondary | ICD-10-CM | POA: Insufficient documentation

## 2022-05-31 DIAGNOSIS — Z1152 Encounter for screening for COVID-19: Secondary | ICD-10-CM | POA: Diagnosis present

## 2022-05-31 LAB — RESP PANEL BY RT-PCR (FLU A&B, COVID) ARPGX2
Influenza A by PCR: NEGATIVE
Influenza B by PCR: NEGATIVE
SARS Coronavirus 2 by RT PCR: NEGATIVE

## 2022-05-31 LAB — POCT RAPID STREP A (OFFICE): Rapid Strep A Screen: NEGATIVE

## 2022-05-31 MED ORDER — IBUPROFEN 800 MG PO TABS
800.0000 mg | ORAL_TABLET | Freq: Once | ORAL | Status: AC
  Administered 2022-05-31: 800 mg via ORAL

## 2022-05-31 NOTE — ED Triage Notes (Signed)
Pt is present today with c/o sore throat and nasal congestion. Pt sx started x2 days ago

## 2022-05-31 NOTE — ED Provider Notes (Signed)
[facility]    CSN: [id number] Arrival date & time: 05/31/22  1110      History   Chief Complaint Chief Complaint  Patient presents with   Sore Throat   Nasal Congestion    HPI Paula Warner is a 36 y.o. female.   Patient here today for evaluation of sore throat, nasal congestion, sneezing and cough that started 2 days ago. She reports her worst symptom is her sore throat. She has not had fever. She reports she has taken dayquil with mild relief. Last dose of medication was early this morning.   The history is provided by the patient.  Sore Throat Pertinent negatives include no abdominal pain and no shortness of breath.    Past Medical History:  Diagnosis Date   Asthma    History of asthma    no current med.   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   on anticoagulants for a period of time, but no longer on them   History of kidney stones    May-Thurner syndrome    Menstrual periods irregular     Patient Active Problem List   Diagnosis Date Noted   Acute gallstone pancreatitis 02/02/2020   Secondary amenorrhea 10/28/2018   May-Thurner syndrome 07/12/2016   DVT (deep venous thrombosis) ([place]) 07/11/2016   DVT of lower extremity (deep venous thrombosis) ([place]) 01/24/2016   High risk sexual behavior 08/04/2015    Past Surgical History:  Procedure Laterality Date   CESAREAN SECTION     x 2   CHOLECYSTECTOMY N/A 02/05/2020   Procedure: LAPAROSCOPIC CHOLECYSTECTOMY WITH POSSIBLE INTRAOPERATIVE CHOLANGIOGRAM;  Surgeon: Michael Boston, MD;  Location: [facility];  Service: General;  Laterality: N/A;   CYSTOSCOPY WITH RETROGRADE PYELOGRAM, URETEROSCOPY AND STENT PLACEMENT Bilateral 04/11/2022   Procedure: CYSTOSCOPY WITH RETROGRADE PYELOGRAM, URETEROSCOPY BASKETING OF STONE AND STENT PLACEMENT;  Surgeon: Alexis Frock, MD;  Location: [facility];  Service: Urology;  Laterality: Bilateral;  1 HR   LAPAROSCOPIC TUBAL LIGATION Bilateral 05/08/2017   Procedure:  LAPAROSCOPIC TUBAL LIGATION - FILSHIE CLIPS;  Surgeon: Emily Filbert, MD;  Location: [facility];  Service: Gynecology;  Laterality: Bilateral;   LOWER EXTREMITY ANGIOGRAM Left 07/11/2016   Procedure: Percutaneous Venous Thrombectomy with IVUS and  with Stent Placement;  Surgeon: Serafina Mitchell, MD;  Location: [facility];  Service: Vascular;  Laterality: Left;   STENT PLACEMENT VASCULAR ([place] HX) Left 2017   L leg due to DVT   TUBAL LIGATION Bilateral     OB History     Gravida  3   Para  1   Term      Preterm      AB  1   Living  2      SAB      IAB  1   Ectopic      Multiple  0   Live Births  2            Home Medications    Prior to Admission medications   Medication Sig Start Date End Date Taking? Authorizing Provider  ketorolac (TORADOL) 10 MG tablet Take 1 tablet (10 mg total) by mouth every 8 (eight) hours as needed for moderate pain (or stent discomfort post-operatively). 04/11/22   Alexis Frock, MD  ondansetron (ZOFRAN-ODT) 8 MG disintegrating tablet Take 1 tablet (8 mg total) by mouth every 8 (eight) hours as needed for nausea or vomiting. Patient not taking: Reported on 04/09/2022 04/04/22   Sherrill Raring,  PA-C  oxyCODONE-acetaminophen (PERCOCET/ROXICET) 5-325 MG tablet Take 1 tablet by mouth every 6 (six) hours as needed for severe pain (post-operatively). 04/11/22   Alexis Frock, MD    Family History Family History  Problem Relation Age of Onset   Cancer Father    Clotting disorder Father    Heart disease Father    Heart murmur Mother    Asthma Mother     Social History Social History   Tobacco Use   Smoking status: Never   Smokeless tobacco: Never  Vaping Use   Vaping Use: Never used  Substance Use Topics   Alcohol use: No    Alcohol/week: 0.0 standard drinks of alcohol   Drug use: No     Allergies   Zinacef [cefuroxime]   Review of Systems Review of Systems  Constitutional:  Negative for chills and fever.  HENT:   Positive for congestion and sore throat. Negative for ear pain and sinus pressure.   Eyes:  Negative for discharge and redness.  Respiratory:  Positive for cough. Negative for shortness of breath and wheezing.   Gastrointestinal:  Negative for abdominal pain, diarrhea, nausea and vomiting.     Physical Exam Triage Vital Signs ED Triage Vitals  Enc Vitals Group     BP 05/31/22 1252 115/81     Pulse Rate 05/31/22 1252 61     Resp 05/31/22 1252 18     Temp 05/31/22 1252 98.1 F (36.7 C)     Temp src --      SpO2 05/31/22 1252 98 %     Weight --      Height --      Head Circumference --      Peak Flow --      Pain Score 05/31/22 1251 10     Pain Loc --      Pain Edu? --      Excl. in [place]? --    No data found.  Updated Vital Signs BP 115/81   Pulse 61   Temp 98.1 F (36.7 C)   Resp 18   SpO2 98%      Physical Exam Vitals and nursing note reviewed.  Constitutional:      General: She is not in acute distress.    Appearance: Normal appearance. She is not ill-appearing.  HENT:     Head: Normocephalic and atraumatic.     Nose: Congestion present.     Mouth/Throat:     Mouth: Mucous membranes are moist.     Pharynx: Posterior oropharyngeal erythema present. No oropharyngeal exudate.  Eyes:     Conjunctiva/sclera: Conjunctivae normal.  Cardiovascular:     Rate and Rhythm: Normal rate and regular rhythm.     Heart sounds: Normal heart sounds. No murmur heard. Pulmonary:     Effort: Pulmonary effort is normal. No respiratory distress.     Breath sounds: Normal breath sounds. No wheezing, rhonchi or rales.  Skin:    General: Skin is warm and dry.  Neurological:     Mental Status: She is alert.  Psychiatric:        Mood and Affect: Mood normal.        Thought Content: Thought content normal.      UC Treatments / Results  Labs (all labs ordered are listed, but only abnormal results are displayed) Labs Reviewed  RESP PANEL BY RT-PCR (FLU A&B, COVID) ARPGX2   CULTURE, GROUP A STREP [facility])  POCT RAPID STREP A (OFFICE)    EKG  Radiology No results found.  Procedures Procedures (including critical care time)  Medications Ordered in UC Medications  ibuprofen (ADVIL) tablet 800 mg (800 mg Oral Given 05/31/22 1407)    Initial Impression / Assessment and Plan / UC Course  I have reviewed the triage vital signs and the nursing notes.  Pertinent labs & imaging results that were available during my care of the patient were reviewed by me and considered in my medical decision making (see chart for details).    Strep test negative. Culture ordered. Will also order covid and flu screening. Ibuprofen administered in office for pain. Encouraged symptomatic treatment while awaiting results. Recommend follow up with any further concerns or worsening or persistent symptoms.   Final Clinical Impressions(s) / UC Diagnoses   Final diagnoses:  Encounter for screening for COVID-19  Acute upper respiratory infection   Discharge Instructions   None    ED Prescriptions   None    PDMP not reviewed this encounter.   Francene Finders, PA-C 05/31/22 1421

## 2022-06-03 LAB — CULTURE, GROUP A STREP (THRC)

## 2022-06-12 DIAGNOSIS — M79642 Pain in left hand: Secondary | ICD-10-CM | POA: Diagnosis not present

## 2022-06-12 DIAGNOSIS — S61432A Puncture wound without foreign body of left hand, initial encounter: Secondary | ICD-10-CM | POA: Diagnosis not present

## 2022-06-19 DIAGNOSIS — M85642 Other cyst of bone, left hand: Secondary | ICD-10-CM | POA: Diagnosis not present

## 2022-06-19 DIAGNOSIS — Z6841 Body Mass Index (BMI) 40.0 and over, adult: Secondary | ICD-10-CM | POA: Diagnosis not present

## 2022-06-19 DIAGNOSIS — M79642 Pain in left hand: Secondary | ICD-10-CM | POA: Diagnosis not present

## 2022-06-19 DIAGNOSIS — D219 Benign neoplasm of connective and other soft tissue, unspecified: Secondary | ICD-10-CM | POA: Diagnosis not present

## 2022-07-06 DIAGNOSIS — D219 Benign neoplasm of connective and other soft tissue, unspecified: Secondary | ICD-10-CM | POA: Diagnosis not present

## 2022-07-06 DIAGNOSIS — D259 Leiomyoma of uterus, unspecified: Secondary | ICD-10-CM | POA: Diagnosis not present

## 2022-07-07 DIAGNOSIS — D219 Benign neoplasm of connective and other soft tissue, unspecified: Secondary | ICD-10-CM | POA: Diagnosis not present

## 2022-07-07 DIAGNOSIS — D259 Leiomyoma of uterus, unspecified: Secondary | ICD-10-CM | POA: Diagnosis not present

## 2022-07-16 DIAGNOSIS — L700 Acne vulgaris: Secondary | ICD-10-CM | POA: Diagnosis not present

## 2022-07-16 DIAGNOSIS — L810 Postinflammatory hyperpigmentation: Secondary | ICD-10-CM | POA: Diagnosis not present

## 2022-07-16 DIAGNOSIS — L738 Other specified follicular disorders: Secondary | ICD-10-CM | POA: Diagnosis not present

## 2022-07-18 DIAGNOSIS — M79651 Pain in right thigh: Secondary | ICD-10-CM | POA: Diagnosis not present

## 2022-07-31 DIAGNOSIS — R2232 Localized swelling, mass and lump, left upper limb: Secondary | ICD-10-CM | POA: Diagnosis not present

## 2022-07-31 DIAGNOSIS — M71342 Other bursal cyst, left hand: Secondary | ICD-10-CM | POA: Diagnosis not present

## 2022-08-15 DIAGNOSIS — M71342 Other bursal cyst, left hand: Secondary | ICD-10-CM | POA: Diagnosis not present

## 2022-08-15 DIAGNOSIS — R600 Localized edema: Secondary | ICD-10-CM | POA: Diagnosis not present

## 2022-08-20 DIAGNOSIS — Z3042 Encounter for surveillance of injectable contraceptive: Secondary | ICD-10-CM | POA: Diagnosis not present

## 2022-08-21 DIAGNOSIS — M79642 Pain in left hand: Secondary | ICD-10-CM | POA: Diagnosis not present

## 2022-08-27 DIAGNOSIS — D219 Benign neoplasm of connective and other soft tissue, unspecified: Secondary | ICD-10-CM | POA: Diagnosis not present

## 2022-08-27 DIAGNOSIS — Z79899 Other long term (current) drug therapy: Secondary | ICD-10-CM | POA: Diagnosis not present

## 2022-08-27 DIAGNOSIS — R635 Abnormal weight gain: Secondary | ICD-10-CM | POA: Diagnosis not present

## 2022-08-27 DIAGNOSIS — M542 Cervicalgia: Secondary | ICD-10-CM | POA: Diagnosis not present

## 2022-08-27 DIAGNOSIS — Z6838 Body mass index (BMI) 38.0-38.9, adult: Secondary | ICD-10-CM | POA: Diagnosis not present

## 2022-08-28 DIAGNOSIS — R635 Abnormal weight gain: Secondary | ICD-10-CM | POA: Diagnosis not present

## 2022-08-28 DIAGNOSIS — Z79899 Other long term (current) drug therapy: Secondary | ICD-10-CM | POA: Diagnosis not present

## 2022-09-05 ENCOUNTER — Ambulatory Visit: Payer: Commercial Managed Care - HMO | Admitting: Student

## 2022-10-02 DIAGNOSIS — D219 Benign neoplasm of connective and other soft tissue, unspecified: Secondary | ICD-10-CM | POA: Diagnosis not present

## 2022-10-02 DIAGNOSIS — Z3009 Encounter for other general counseling and advice on contraception: Secondary | ICD-10-CM | POA: Diagnosis not present

## 2022-10-02 DIAGNOSIS — D259 Leiomyoma of uterus, unspecified: Secondary | ICD-10-CM | POA: Diagnosis not present

## 2022-10-15 DIAGNOSIS — Z30430 Encounter for insertion of intrauterine contraceptive device: Secondary | ICD-10-CM | POA: Diagnosis not present

## 2022-10-15 DIAGNOSIS — N938 Other specified abnormal uterine and vaginal bleeding: Secondary | ICD-10-CM | POA: Diagnosis not present

## 2022-10-15 DIAGNOSIS — Z3200 Encounter for pregnancy test, result unknown: Secondary | ICD-10-CM | POA: Diagnosis not present

## 2022-10-16 ENCOUNTER — Encounter: Payer: Self-pay | Admitting: Obstetrics

## 2022-10-16 ENCOUNTER — Ambulatory Visit (INDEPENDENT_AMBULATORY_CARE_PROVIDER_SITE_OTHER): Payer: Commercial Managed Care - HMO | Admitting: Obstetrics

## 2022-10-16 ENCOUNTER — Other Ambulatory Visit (HOSPITAL_COMMUNITY)
Admission: RE | Admit: 2022-10-16 | Discharge: 2022-10-16 | Disposition: A | Discharge status: Home / Self Care | Payer: Commercial Managed Care - HMO | Source: Ambulatory Visit | Attending: Student | Admitting: Student

## 2022-10-16 VITALS — BP 101/64 | HR 74 | Ht 61.0 in | Wt 159.0 lb

## 2022-10-16 DIAGNOSIS — N898 Other specified noninflammatory disorders of vagina: Secondary | ICD-10-CM | POA: Insufficient documentation

## 2022-10-16 DIAGNOSIS — Z01419 Encounter for gynecological examination (general) (routine) without abnormal findings: Secondary | ICD-10-CM | POA: Insufficient documentation

## 2022-10-16 DIAGNOSIS — Z3042 Encounter for surveillance of injectable contraceptive: Secondary | ICD-10-CM

## 2022-10-16 DIAGNOSIS — Z113 Encounter for screening for infections with a predominantly sexual mode of transmission: Secondary | ICD-10-CM | POA: Diagnosis not present

## 2022-10-16 LAB — POCT URINE PREGNANCY: Preg Test, Ur: NEGATIVE

## 2022-10-16 NOTE — Progress Notes (Signed)
Subjective:        Paula Warner is a 37 y.o. female here for a routine exam.  Current complaints: Vaginal discharge.    Personal health questionnaire:  Is patient [name], have a family history of breast and/or ovarian cancer: no Is there a family history of uterine cancer diagnosed at age < 65, gastrointestinal cancer, urinary tract cancer, family member who is a Field seismologist syndrome-associated carrier: no Is the patient overweight and hypertensive, family history of diabetes, personal history of gestational diabetes, preeclampsia or PCOS: no Is patient over 57, have PCOS,  family history of premature CHD under age 77, diabetes, smoke, have hypertension or peripheral artery disease:  no At any time, has a partner hit, kicked or otherwise hurt or frightened you?: no Over the past 2 weeks, have you felt down, depressed or hopeless?: no Over the past 2 weeks, have you felt little interest or pleasure in doing things?:no   Gynecologic History Patient's last menstrual period was 10/13/2022 (exact date). Contraception: tubal ligation Last Pap: 2022. Results were: normal Last mammogram: n/a. Results were: n/a  Obstetric History OB History  Gravida Para Term Preterm AB Living  3 1     1 2   SAB IAB Ectopic Multiple Live Births    1   0 2    # Outcome Date GA Lbr Len/2nd Weight Sex Delivery Anes PTL Lv  3 IAB 04/29/15          2 Gravida 04/15/10 [redacted]w[redacted]d  6 lb 6 oz (2.892 kg) M CS-LTranv Spinal N LIV  1 Para 12/01/04 [redacted]w[redacted]d  6 lb 7 oz (2.92 kg) M CS-LTranv Spinal N LIV     Complications: Dysfunctional Labor    Past Medical History:  Diagnosis Date   Asthma    History of asthma    no current med.   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   on anticoagulants for a period of time, but no longer on them   History of kidney stones    May-Thurner syndrome    Menstrual periods irregular     Past Surgical History:  Procedure Laterality Date   CESAREAN SECTION     x 2    CHOLECYSTECTOMY N/A 02/05/2020   Procedure: LAPAROSCOPIC CHOLECYSTECTOMY WITH POSSIBLE INTRAOPERATIVE CHOLANGIOGRAM;  Surgeon: Michael Boston, MD;  [facility];  Service: General;  Laterality: N/A;   CYSTOSCOPY WITH RETROGRADE PYELOGRAM, URETEROSCOPY AND STENT PLACEMENT Bilateral 04/11/2022   Procedure: CYSTOSCOPY WITH RETROGRADE PYELOGRAM, URETEROSCOPY BASKETING OF STONE AND STENT PLACEMENT;  Surgeon: Alexis Frock, MD;  Location[facility];  Service: Urology;  Laterality: Bilateral;  1 HR   LAPAROSCOPIC TUBAL LIGATION Bilateral 05/08/2017   Procedure: LAPAROSCOPIC TUBAL LIGATION - FILSHIE CLIPS;  Surgeon: Emily Filbert, MD;  Location: [facility];  Service: Gynecology;  Laterality: Bilateral;   LOWER EXTREMITY ANGIOGRAM Left 07/11/2016   Procedure: Percutaneous Venous Thrombectomy with IVUS and  with Stent Placement;  Surgeon: Serafina Mitchell, MD;  Location: [facility];  Service: Vascular;  Laterality: Left;   STENT PLACEMENT VASCULAR ([place] HX) Left 2017   L leg due to DVT   TUBAL LIGATION Bilateral      Current Outpatient Medications:    ketorolac (TORADOL) 10 MG tablet, Take 1 tablet (10 mg total) by mouth every 8 (eight) hours as needed for moderate pain (or stent discomfort post-operatively)., Disp: 20 tablet, Rfl: 0   ondansetron (ZOFRAN-ODT) 8 MG disintegrating tablet, Take 1 tablet (8 mg total) by  mouth every 8 (eight) hours as needed for nausea or vomiting. (Patient not taking: Reported on 04/09/2022), Disp: 20 tablet, Rfl: 0   oxyCODONE-acetaminophen (PERCOCET/ROXICET) 5-325 MG tablet, Take 1 tablet by mouth every 6 (six) hours as needed for severe pain (post-operatively)., Disp: 15 tablet, Rfl: 0 Allergies  Allergen Reactions   Zinacef [Cefuroxime] Hives    Social History   Tobacco Use   Smoking status: Never   Smokeless tobacco: Never  Substance Use Topics   Alcohol use: No    Alcohol/week: 0.0 standard drinks of alcohol    Family History  Problem Relation Age of  Onset   Cancer Father    Clotting disorder Father    Heart disease Father    Heart murmur Mother    Asthma Mother       Review of Systems  Constitutional: negative for fatigue and weight lossRespiratory: negative for cough and wheezing Cardiovascular: negative for chest pain, fatigue and palpitations Gastrointestinal: negative for abdominal pain and change in bowel habits Musculoskeletal:negative for myalgias Neurological: negative for gait problems and tremors Behavioral/Psych: negative for abusive relationship, depression Endocrine: negative for temperature intolerance    Genitourinary: positive for vaginal discharge.  negative for abnormal menstrual periods, genital lesions, hot flashes, sexual problem Integument/breast: negative for breast lump, breast tenderness, nipple discharge and skin lesion(s)    Objective:       BP 101/64   Pulse 74   Ht 5\' 1"  (1.549 m)   Wt 159 lb (72.1 kg)   LMP 10/13/2022 (Exact Date)   BMI 30.04 kg/m  General:   alert  Skin:   no rash or abnormalities  Lungs:   clear to auscultation bilaterally  Heart:   regular rate and rhythm, S1, S2 normal, no murmur, click, rub or gallop  Breasts:   normal without suspicious masses, skin or nipple changes or axillary nodes  Abdomen:  normal findings: no organomegaly, soft, non-tender and no hernia  Pelvis:  External genitalia: normal general appearance Urinary system: urethral meatus normal and bladder without fullness, nontender Vaginal: normal without tenderness, induration or masses Cervix: normal appearance Adnexa: normal bimanual exam Uterus: anteverted and non-tender, normal size   Lab Review Urine pregnancy test Labs reviewed yes Radiologic studies reviewed no  I have spent a total of 20 minutes of face-to-face time, excluding clinical staff time, reviewing notes and preparing to see patient, ordering tests and/or medications, and counseling the patient.   Assessment:    1. Women's  annual routine gynecological examination [W65.681] Rx: - Cytology - PAP( [place]) - POCT urine pregnancy  2. Vaginal discharge Rx: - Cervicovaginal ancillary only( [place])  3. Screen for STD (sexually transmitted disease) Rx: - Hepatitis C Antibody - Hepatitis B Surface AntiGEN - HIV antibody (with reflex) - RPR  4. Encounter for surveillance of injectable contraceptive - doing well     Plan:    Education reviewed: calcium supplements, depression evaluation, low fat, low cholesterol diet, safe sex/STD prevention, self breast exams, and weight bearing exercise. Follow up in: 1 year.    Orders Placed This Encounter  Procedures   Hepatitis C Antibody   Hepatitis B Surface AntiGEN   HIV antibody (with reflex)   RPR   POCT urine pregnancy    Zariel Capano A. Jodi Mourning MD 10/16/2022

## 2022-10-16 NOTE — Progress Notes (Addendum)
37 y.o GYN presents for AEX/PAP/STD Screening.  Pt requested UPT.  C/o bump on labia.  UPT is Negative

## 2022-10-17 LAB — CERVICOVAGINAL ANCILLARY ONLY
Bacterial Vaginitis (gardnerella): NEGATIVE
Candida Glabrata: NEGATIVE
Candida Vaginitis: NEGATIVE
Chlamydia: NEGATIVE
Comment: NEGATIVE
Comment: NEGATIVE
Comment: NEGATIVE
Comment: NEGATIVE
Comment: NEGATIVE
Comment: NORMAL
Neisseria Gonorrhea: NEGATIVE
Trichomonas: NEGATIVE

## 2022-10-17 LAB — HIV ANTIBODY (ROUTINE TESTING W REFLEX): HIV Screen 4th Generation wRfx: NONREACTIVE

## 2022-10-17 LAB — HEPATITIS C ANTIBODY: Hep C Virus Ab: NONREACTIVE

## 2022-10-17 LAB — HEPATITIS B SURFACE ANTIGEN: Hepatitis B Surface Ag: NEGATIVE

## 2022-10-17 LAB — RPR: RPR Ser Ql: NONREACTIVE

## 2022-10-22 DIAGNOSIS — L02426 Furuncle of left lower limb: Secondary | ICD-10-CM | POA: Diagnosis not present

## 2022-10-22 DIAGNOSIS — L810 Postinflammatory hyperpigmentation: Secondary | ICD-10-CM | POA: Diagnosis not present

## 2022-10-22 DIAGNOSIS — L858 Other specified epidermal thickening: Secondary | ICD-10-CM | POA: Diagnosis not present

## 2022-10-22 DIAGNOSIS — L02425 Furuncle of right lower limb: Secondary | ICD-10-CM | POA: Diagnosis not present

## 2022-10-23 LAB — CYTOLOGY - PAP
Comment: NEGATIVE
Diagnosis: NEGATIVE
Diagnosis: REACTIVE
High risk HPV: NEGATIVE

## 2022-11-01 ENCOUNTER — Emergency Department (HOSPITAL_COMMUNITY)
Admission: EM | Admit: 2022-11-01 | Discharge: 2022-11-01 | Disposition: A | Discharge status: Home / Self Care | Payer: Medicaid Other | Attending: Emergency Medicine | Admitting: Emergency Medicine

## 2022-11-01 ENCOUNTER — Encounter (HOSPITAL_COMMUNITY): Payer: Self-pay

## 2022-11-01 ENCOUNTER — Other Ambulatory Visit: Payer: Self-pay

## 2022-11-01 DIAGNOSIS — R519 Headache, unspecified: Secondary | ICD-10-CM | POA: Diagnosis not present

## 2022-11-01 DIAGNOSIS — R799 Abnormal finding of blood chemistry, unspecified: Secondary | ICD-10-CM | POA: Insufficient documentation

## 2022-11-01 LAB — CBG MONITORING, ED: Glucose-Capillary: 100 mg/dL — ABNORMAL HIGH (ref 70–99)

## 2022-11-01 MED ORDER — METOCLOPRAMIDE HCL 5 MG/ML IJ SOLN
10.0000 mg | Freq: Once | INTRAMUSCULAR | Status: AC
  Administered 2022-11-01: 10 mg via INTRAVENOUS
  Filled 2022-11-01: qty 2

## 2022-11-01 MED ORDER — IBUPROFEN 800 MG PO TABS: 800.0000 mg | ORAL_TABLET | Freq: Three times a day (TID) | ORAL | 0 refills | Status: DC | PRN

## 2022-11-01 MED ORDER — KETOROLAC TROMETHAMINE 30 MG/ML IJ SOLN
30.0000 mg | Freq: Once | INTRAMUSCULAR | Status: AC
  Administered 2022-11-01: 30 mg via INTRAVENOUS
  Filled 2022-11-01: qty 1

## 2022-11-01 MED ORDER — DIPHENHYDRAMINE HCL 50 MG/ML IJ SOLN
25.0000 mg | Freq: Once | INTRAMUSCULAR | Status: AC
  Administered 2022-11-01: 25 mg via INTRAVENOUS
  Filled 2022-11-01: qty 1

## 2022-11-01 NOTE — ED Provider Notes (Signed)
[place] EM[facility] DEPARTMENT AT [facility] Provider Note   CSN: [id number] Arrival date & time: 11/01/22  0754     History  Chief Complaint  Patient presents with   Headache    Paula Warner is a 37 y.o. female.  Patient complains of a headache.  She has taken Tylenol without help.  No fever no chills no neck pain.  Patient has a history of occasional headaches  The history is provided by the patient and medical records.  Headache Pain location:  Generalized Quality:  Dull Radiates to:  Does not radiate Severity currently:  8/10 Severity at highest:  8/10 Onset quality:  Sudden Timing:  Constant Progression:  Waxing and waning Chronicity:  New Similar to prior headaches: no   Context: not activity   Relieved by:  Nothing Worsened by:  Nothing Associated symptoms: no abdominal pain, no back pain, no congestion, no cough, no diarrhea, no fatigue, no seizures and no sinus pressure        Home Medications Prior to Admission medications   Medication Sig Start Date End Date Taking? Authorizing Provider  ibuprofen (ADVIL) 800 MG tablet Take 1 tablet (800 mg total) by mouth every 8 (eight) hours as needed for headache. 11/01/22  Yes Milton Ferguson, MD  ketorolac (TORADOL) 10 MG tablet Take 1 tablet (10 mg total) by mouth every 8 (eight) hours as needed for moderate pain (or stent discomfort post-operatively). 04/11/22   Alexis Frock, MD  ondansetron (ZOFRAN-ODT) 8 MG disintegrating tablet Take 1 tablet (8 mg total) by mouth every 8 (eight) hours as needed for nausea or vomiting. Patient not taking: Reported on 04/09/2022 04/04/22   Sherrill Raring, PA-C  oxyCODONE-acetaminophen (PERCOCET/ROXICET) 5-325 MG tablet Take 1 tablet by mouth every 6 (six) hours as needed for severe pain (post-operatively). 04/11/22   Alexis Frock, MD      Allergies    Zinacef [cefuroxime]    Review of Systems   Review of Systems  Constitutional:  Negative for appetite change and  fatigue.  HENT:  Negative for congestion, ear discharge and sinus pressure.   Eyes:  Negative for discharge.  Respiratory:  Negative for cough.   Cardiovascular:  Negative for chest pain.  Gastrointestinal:  Negative for abdominal pain and diarrhea.  Genitourinary:  Negative for frequency and hematuria.  Musculoskeletal:  Negative for back pain.  Skin:  Negative for rash.  Neurological:  Positive for headaches. Negative for seizures.  Psychiatric/Behavioral:  Negative for hallucinations.     Physical Exam Updated Vital Signs BP 120/66 (BP Location: Right Arm)   Pulse 68   Temp 98.5 F (36.9 C) (Oral)   Resp 18   LMP 10/13/2022 (Exact Date)   SpO2 97%  Physical Exam Vitals and nursing note reviewed.  Constitutional:      Appearance: She is well-developed.  HENT:     Head: Normocephalic.     Nose: Nose normal.  Eyes:     General: No scleral icterus.    Conjunctiva/sclera: Conjunctivae normal.  Neck:     Thyroid: No thyromegaly.  Cardiovascular:     Rate and Rhythm: Normal rate and regular rhythm.     Heart sounds: No murmur heard.    No friction rub. No gallop.  Pulmonary:     Breath sounds: No stridor. No wheezing or rales.  Chest:     Chest wall: No tenderness.  Abdominal:     General: There is no distension.     Tenderness: There is no  abdominal tenderness. There is no rebound.  Musculoskeletal:        General: Normal range of motion.     Cervical back: Neck supple.  Lymphadenopathy:     Cervical: No cervical adenopathy.  Skin:    Findings: No erythema or rash.  Neurological:     Mental Status: She is alert and oriented to person, place, and time.     Motor: No abnormal muscle tone.     Coordination: Coordination normal.  Psychiatric:        Behavior: Behavior normal.     ED Results / Procedures / Treatments   Labs (all labs ordered are listed, but only abnormal results are displayed) Labs Reviewed  CBG MONITORING, ED - Abnormal; Notable for the  following components:      Result Value   Glucose-Capillary 100 (*)    All other components within normal limits    EKG None  Radiology No results found.  Procedures Procedures    Medications Ordered in ED Medications  diphenhydrAMINE (BENADRYL) injection 25 mg (25 mg Intravenous Given 11/01/22 0902)  ketorolac (TORADOL) 30 MG/ML injection 30 mg (30 mg Intravenous Given 11/01/22 0901)  metoCLOPramide (REGLAN) injection 10 mg (10 mg Intravenous Given 11/01/22 0902)    ED Course/ Medical Decision Making/ A&P                             Medical Decision Making Risk Prescription drug management.  This patient presents to the ED for concern of headache, this involves an extensive number of treatment options, and is a complaint that carries with it a high risk of complications and morbidity.  The differential diagnosis includes migraine headache, viral infection   Co morbidities that complicate the patient evaluation  None   Additional history obtained:  Additional history obtained from patient External records from outside source obtained and reviewed including hospital records   Lab Tests:  No labs Imaging Studies ordered: No imaging  Cardiac Monitoring: / EKG:  The patient was maintained on a cardiac monitor.  I personally viewed and interpreted the cardiac monitored which showed an underlying rhythm of: Normal sinus rhythm   Consultations Obtained:  No consultant Problem List / ED Course / Critical interventions / Medication management  Headache I ordered medication including migraine cocktail Reevaluation of the patient after these medicines showed that the patient improved I have reviewed the patients home medicines and have made adjustments as needed   Social Determinants of Health:  None   Test / Admission - Considered:  None  Bad headache that improved with migraine cocktail.  Patient given Motrin and will follow-up with PCP as  needed        Final Clinical Impression(s) / ED Diagnoses Final diagnoses:  Bad headache    Rx / DC Orders ED Discharge Orders          Ordered    ibuprofen (ADVIL) 800 MG tablet  Every 8 hours PRN        11/01/22 0945              Milton Ferguson, MD 11/02/22 1152

## 2022-11-01 NOTE — Discharge Instructions (Signed)
Follow-up with your family doctor if any problems 

## 2022-11-01 NOTE — ED Triage Notes (Addendum)
Pt reports intermittent headache x1 week with nausea, dizziness, and photosensitivity.  Tylenol and goody powder w/o relief.

## 2022-11-06 DIAGNOSIS — G4730 Sleep apnea, unspecified: Secondary | ICD-10-CM | POA: Diagnosis not present

## 2022-11-06 DIAGNOSIS — R519 Headache, unspecified: Secondary | ICD-10-CM | POA: Diagnosis not present

## 2022-11-06 DIAGNOSIS — E789 Disorder of lipoprotein metabolism, unspecified: Secondary | ICD-10-CM | POA: Diagnosis not present

## 2022-11-06 DIAGNOSIS — Z79899 Other long term (current) drug therapy: Secondary | ICD-10-CM | POA: Diagnosis not present

## 2022-11-06 DIAGNOSIS — E559 Vitamin D deficiency, unspecified: Secondary | ICD-10-CM | POA: Diagnosis not present

## 2022-11-06 DIAGNOSIS — Z6830 Body mass index (BMI) 30.0-30.9, adult: Secondary | ICD-10-CM | POA: Diagnosis not present

## 2022-11-06 DIAGNOSIS — D539 Nutritional anemia, unspecified: Secondary | ICD-10-CM | POA: Diagnosis not present

## 2022-11-16 DIAGNOSIS — E789 Disorder of lipoprotein metabolism, unspecified: Secondary | ICD-10-CM | POA: Diagnosis not present

## 2022-11-16 DIAGNOSIS — Z6841 Body Mass Index (BMI) 40.0 and over, adult: Secondary | ICD-10-CM | POA: Diagnosis not present

## 2022-11-16 DIAGNOSIS — R519 Headache, unspecified: Secondary | ICD-10-CM | POA: Diagnosis not present

## 2022-11-16 DIAGNOSIS — E559 Vitamin D deficiency, unspecified: Secondary | ICD-10-CM | POA: Diagnosis not present

## 2022-11-16 DIAGNOSIS — G4730 Sleep apnea, unspecified: Secondary | ICD-10-CM | POA: Diagnosis not present

## 2022-11-19 DIAGNOSIS — R519 Headache, unspecified: Secondary | ICD-10-CM | POA: Diagnosis not present

## 2022-11-20 ENCOUNTER — Ambulatory Visit (INDEPENDENT_AMBULATORY_CARE_PROVIDER_SITE_OTHER): Payer: BC Managed Care – PPO | Admitting: Obstetrics

## 2022-11-20 ENCOUNTER — Encounter: Payer: Self-pay | Admitting: Obstetrics

## 2022-11-20 VITALS — BP 108/75 | HR 84 | Ht 61.0 in | Wt 163.0 lb

## 2022-11-20 DIAGNOSIS — N764 Abscess of vulva: Secondary | ICD-10-CM

## 2022-11-20 MED ORDER — CLINDAMYCIN HCL 300 MG PO CAPS: 300.0000 mg | ORAL_CAPSULE | Freq: Three times a day (TID) | ORAL | 0 refills | Status: DC

## 2022-11-20 NOTE — Progress Notes (Signed)
Pt presents for concerns of boils on vaginal lip for a couple of months and vulvar crease for about 1-2 weeks. Reports pain when manipulated but otherwise not painful. Denies any drainage from boils.

## 2022-11-20 NOTE — Progress Notes (Signed)
Patient ID: Paula Warner, female   DOB: 1985-11-24, 37 y.o.   MRN: [id number]  HPI Paula Warner is a 37 y.o. female.  Complains of boil on right vulva HPI  Past Medical History:  Diagnosis Date   Asthma    History of asthma    no current med.   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   on anticoagulants for a period of time, but no longer on them   History of kidney stones    May-Thurner syndrome    Menstrual periods irregular     Past Surgical History:  Procedure Laterality Date   CESAREAN SECTION     x 2   CHOLECYSTECTOMY N/A 02/05/2020   Procedure: LAPAROSCOPIC CHOLECYSTECTOMY WITH POSSIBLE INTRAOPERATIVE CHOLANGIOGRAM;  Surgeon: Michael Boston, MD;  Location: [facility];  Service: General;  Laterality: N/A;   CYSTOSCOPY WITH RETROGRADE PYELOGRAM, URETEROSCOPY AND STENT PLACEMENT Bilateral 04/11/2022   Procedure: CYSTOSCOPY WITH RETROGRADE PYELOGRAM, URETEROSCOPY BASKETING OF STONE AND STENT PLACEMENT;  Surgeon: Alexis Frock, MD;  Location: [facility];  Service: Urology;  Laterality: Bilateral;  1 HR   LAPAROSCOPIC TUBAL LIGATION Bilateral 05/08/2017   Procedure: LAPAROSCOPIC TUBAL LIGATION - FILSHIE CLIPS;  Surgeon: Emily Filbert, MD;  Location: [facility];  Service: Gynecology;  Laterality: Bilateral;   LOWER EXTREMITY ANGIOGRAM Left 07/11/2016   Procedure: Percutaneous Venous Thrombectomy with IVUS and  with Stent Placement;  Surgeon: Serafina Mitchell, MD;  Location: [facility];  Service: Vascular;  Laterality: Left;   STENT PLACEMENT VASCULAR ([place] HX) Left 2017   L leg due to DVT   TUBAL LIGATION Bilateral     Family History  Problem Relation Age of Onset   Cancer Father    Clotting disorder Father    Heart disease Father    Heart murmur Mother    Asthma Mother     Social History Social History   Tobacco Use   Smoking status: Never   Smokeless tobacco: Never  Vaping Use   Vaping Use: Never used  Substance Use Topics   Alcohol use: No     Alcohol/week: 0.0 standard drinks of alcohol   Drug use: No    Allergies  Allergen Reactions   Zinacef [Cefuroxime] Hives    Current Outpatient Medications  Medication Sig Dispense Refill   clindamycin (CLEOCIN) 300 MG capsule Take 1 capsule (300 mg total) by mouth 3 (three) times daily. 21 capsule 0   ibuprofen (ADVIL) 800 MG tablet Take 1 tablet (800 mg total) by mouth every 8 (eight) hours as needed for headache. 15 tablet 0   ketorolac (TORADOL) 10 MG tablet Take 1 tablet (10 mg total) by mouth every 8 (eight) hours as needed for moderate pain (or stent discomfort post-operatively). 20 tablet 0   ondansetron (ZOFRAN-ODT) 8 MG disintegrating tablet Take 1 tablet (8 mg total) by mouth every 8 (eight) hours as needed for nausea or vomiting. (Patient not taking: Reported on 04/09/2022) 20 tablet 0   oxyCODONE-acetaminophen (PERCOCET/ROXICET) 5-325 MG tablet Take 1 tablet by mouth every 6 (six) hours as needed for severe pain (post-operatively). 15 tablet 0   No current facility-administered medications for this visit.    Review of Systems Review of Systems Constitutional: negative for fatigue and weight loss Respiratory: negative for cough and wheezing Cardiovascular: negative for chest pain, fatigue and palpitations Gastrointestinal: negative for abdominal pain and change in bowel habits Genitourinary: positive for boil on right vulva Integument/breast: negative for nipple discharge Musculoskeletal:negative for  myalgias Neurological: negative for gait problems and tremors Behavioral/Psych: negative for abusive relationship, depression Endocrine: negative for temperature intolerance      Blood pressure 108/75, pulse 84, height '5\' 1"'$  (1.549 m), weight 163 lb (73.9 kg).  Physical Exam Physical Exam General:   Alert and no distress  Skin:   no rash or abnormalities  Lungs:   clear to auscultation bilaterally  Heart:   regular rate and rhythm, S1, S2 normal, no murmur, click, rub  or gallop  Breasts:   Not examined  Abdomen:  normal findings: no organomegaly, soft, non-tender and no hernia  Pelvis:  External Genitalia:  small boil on right mid-vulva, tender   I have spent a total of 20 minutes of face-to-face time, excluding clinical staff time, reviewing notes and preparing to see patient, ordering tests and/or medications, and counseling the patient.   Data Reviewed Labs  Assessment     1. Boil of vulva Rx: - clindamycin (CLEOCIN) 300 MG capsule; Take 1 capsule (300 mg total) by mouth 3 (three) times daily.  Dispense: 21 capsule; Refill: 0     Plan Follow up prn    Meds ordered this encounter  Medications   clindamycin (CLEOCIN) 300 MG capsule    Sig: Take 1 capsule (300 mg total) by mouth 3 (three) times daily.    Dispense:  21 capsule    Refill:  0    Shelly Bombard, MD 11/20/2022 8:40 PM

## 2022-11-27 DIAGNOSIS — Z6831 Body mass index (BMI) 31.0-31.9, adult: Secondary | ICD-10-CM | POA: Diagnosis not present

## 2022-11-27 DIAGNOSIS — R519 Headache, unspecified: Secondary | ICD-10-CM | POA: Diagnosis not present

## 2022-11-27 DIAGNOSIS — K760 Fatty (change of) liver, not elsewhere classified: Secondary | ICD-10-CM | POA: Diagnosis not present

## 2022-11-27 DIAGNOSIS — G4733 Obstructive sleep apnea (adult) (pediatric): Secondary | ICD-10-CM | POA: Diagnosis not present

## 2022-11-27 DIAGNOSIS — J45909 Unspecified asthma, uncomplicated: Secondary | ICD-10-CM | POA: Diagnosis not present

## 2022-11-27 DIAGNOSIS — J302 Other seasonal allergic rhinitis: Secondary | ICD-10-CM | POA: Diagnosis not present

## 2022-11-28 DIAGNOSIS — R519 Headache, unspecified: Secondary | ICD-10-CM | POA: Diagnosis not present

## 2022-11-28 DIAGNOSIS — J45909 Unspecified asthma, uncomplicated: Secondary | ICD-10-CM | POA: Diagnosis not present

## 2022-11-28 DIAGNOSIS — G4730 Sleep apnea, unspecified: Secondary | ICD-10-CM | POA: Diagnosis not present

## 2022-12-04 DIAGNOSIS — E559 Vitamin D deficiency, unspecified: Secondary | ICD-10-CM | POA: Diagnosis not present

## 2022-12-04 DIAGNOSIS — J309 Allergic rhinitis, unspecified: Secondary | ICD-10-CM | POA: Diagnosis not present

## 2022-12-04 DIAGNOSIS — N3281 Overactive bladder: Secondary | ICD-10-CM | POA: Diagnosis not present

## 2022-12-04 DIAGNOSIS — R7989 Other specified abnormal findings of blood chemistry: Secondary | ICD-10-CM | POA: Diagnosis not present

## 2022-12-10 DIAGNOSIS — Z30431 Encounter for routine checking of intrauterine contraceptive device: Secondary | ICD-10-CM | POA: Diagnosis not present

## 2022-12-10 DIAGNOSIS — D219 Benign neoplasm of connective and other soft tissue, unspecified: Secondary | ICD-10-CM | POA: Diagnosis not present

## 2022-12-18 DIAGNOSIS — F411 Generalized anxiety disorder: Secondary | ICD-10-CM | POA: Diagnosis not present

## 2022-12-18 DIAGNOSIS — F4312 Post-traumatic stress disorder, chronic: Secondary | ICD-10-CM | POA: Diagnosis not present

## 2022-12-18 DIAGNOSIS — F900 Attention-deficit hyperactivity disorder, predominantly inattentive type: Secondary | ICD-10-CM | POA: Diagnosis not present

## 2022-12-18 DIAGNOSIS — F330 Major depressive disorder, recurrent, mild: Secondary | ICD-10-CM | POA: Diagnosis not present

## 2022-12-21 ENCOUNTER — Encounter (HOSPITAL_COMMUNITY): Payer: Self-pay

## 2022-12-21 ENCOUNTER — Emergency Department (HOSPITAL_COMMUNITY)
Admission: EM | Admit: 2022-12-21 | Discharge: 2022-12-21 | Disposition: A | Discharge status: Home / Self Care | Payer: Medicaid Other | Attending: Emergency Medicine | Admitting: Emergency Medicine

## 2022-12-21 DIAGNOSIS — N764 Abscess of vulva: Secondary | ICD-10-CM | POA: Diagnosis present

## 2022-12-21 LAB — URINALYSIS, ROUTINE W REFLEX MICROSCOPIC
Bilirubin Urine: NEGATIVE
Glucose, UA: NEGATIVE mg/dL
Hgb urine dipstick: NEGATIVE
Ketones, ur: NEGATIVE mg/dL
Leukocytes,Ua: NEGATIVE
Nitrite: NEGATIVE
Protein, ur: NEGATIVE mg/dL
Specific Gravity, Urine: 1.024 (ref 1.005–1.030)
pH: 6 (ref 5.0–8.0)

## 2022-12-21 LAB — PREGNANCY, URINE: Preg Test, Ur: NEGATIVE

## 2022-12-21 MED ORDER — CLINDAMYCIN PHOSPHATE 1 % EX GEL: Freq: Two times a day (BID) | CUTANEOUS | 0 refills | Status: DC

## 2022-12-21 MED ORDER — HYDROCODONE-ACETAMINOPHEN 5-325 MG PO TABS
2.0000 | ORAL_TABLET | Freq: Once | ORAL | Status: AC
  Administered 2022-12-21: 2 via ORAL
  Filled 2022-12-21: qty 2

## 2022-12-21 MED ORDER — LIDOCAINE HCL (PF) 1 % IJ SOLN
5.0000 mL | Freq: Once | INTRAMUSCULAR | Status: AC
  Administered 2022-12-21: 5 mL
  Filled 2022-12-21: qty 30

## 2022-12-21 NOTE — Discharge Instructions (Addendum)
Thank you for allowing me to be a part of your care today.  I have sent an antibiotic ointment over to your pharmacy to use twice a day.  Please continue to use warm compresses and sitz bath's to help the area stay open and drain.  I recommend following up with your primary care provider or your OB/GYN next week.  Return to the ED if you have worsening of your symptoms or if you have any new concerns.

## 2022-12-21 NOTE — ED Triage Notes (Signed)
Pt states she is concerned for a vaginal cyst. Pt first noticed it Monday. Denies discharge/odor.

## 2022-12-21 NOTE — ED Provider Notes (Signed)
[place] EMERGENCY DEPARTMENT AT [facility] Provider Note   CSN: [id number] Arrival date & time: 12/21/22  [id number]     History  Chief Complaint  Patient presents with   Abscess    Paula Warner is a 37 y.o. female with past medical history significant for irregular menstrual periods, DVT, high risk sexual behavior presents to the ED complaining of a possible abscess or cyst to her right labia.  She states that she noticed it on Monday but has continued to grow in size and is now very uncomfortable.  Patient has been using warm compresses at home and states she has not had any discharge from the area.  Denies vaginal discharge, vaginal bleeding, abnormal vaginal odor, concern for STD/STI, new use of any lotions, creams, or other soaps.       Home Medications Prior to Admission medications   Medication Sig Start Date End Date Taking? Authorizing Provider  clindamycin (CLINDAGEL) 1 % gel Apply topically 2 (two) times daily. 12/21/22  Yes Azael Ragain R, PA  clindamycin (CLEOCIN) 300 MG capsule Take 1 capsule (300 mg total) by mouth 3 (three) times daily. 11/20/22   Shelly Bombard, MD  ibuprofen (ADVIL) 800 MG tablet Take 1 tablet (800 mg total) by mouth every 8 (eight) hours as needed for headache. 11/01/22   Milton Ferguson, MD  ketorolac (TORADOL) 10 MG tablet Take 1 tablet (10 mg total) by mouth every 8 (eight) hours as needed for moderate pain (or stent discomfort post-operatively). 04/11/22   Alexis Frock, MD  ondansetron (ZOFRAN-ODT) 8 MG disintegrating tablet Take 1 tablet (8 mg total) by mouth every 8 (eight) hours as needed for nausea or vomiting. Patient not taking: Reported on 04/09/2022 04/04/22   Sherrill Raring, PA-C  oxyCODONE-acetaminophen (PERCOCET/ROXICET) 5-325 MG tablet Take 1 tablet by mouth every 6 (six) hours as needed for severe pain (post-operatively). 04/11/22   Alexis Frock, MD      Allergies    Zinacef [cefuroxime]    Review of Systems   Review  of Systems  Genitourinary:  Negative for vaginal bleeding, vaginal discharge and vaginal pain.       Labial abscess    Physical Exam Updated Vital Signs BP (!) 141/67   Pulse 84   Temp 98.1 F (36.7 C)   Resp 18   Ht 5\' 1"  (1.549 m)   Wt 74.8 kg   SpO2 95%   BMI 31.18 kg/m  Physical Exam Vitals and nursing note reviewed.  Constitutional:      General: She is not in acute distress.    Appearance: Normal appearance. She is not ill-appearing or diaphoretic.  Cardiovascular:     Rate and Rhythm: Normal rate and regular rhythm.  Pulmonary:     Effort: Pulmonary effort is normal.  Abdominal:     General: Abdomen is flat.     Palpations: Abdomen is soft.     Tenderness: There is no abdominal tenderness.  Genitourinary:    Exam position: Supine.    Skin:    General: Skin is warm and dry.  Neurological:     Mental Status: She is alert. Mental status is at baseline.  Psychiatric:        Mood and Affect: Mood normal.        Behavior: Behavior normal.     ED Results / Procedures / Treatments   Labs (all labs ordered are listed, but only abnormal results are displayed) Labs Reviewed  URINALYSIS, ROUTINE W REFLEX MICROSCOPIC -  Abnormal; Notable for the following components:      Result Value   APPearance HAZY (*)    All other components within normal limits  PREGNANCY, URINE    EKG None  Radiology No results found.  Procedures .Marland KitchenIncision and Drainage  Date/Time: 12/21/2022 1:07 PM  Performed by: Pat Kocher, PA Authorized by: Pat Kocher, PA   Consent:    Consent obtained:  Verbal   Consent given by:  Patient   Risks, benefits, and alternatives were discussed: yes     Risks discussed:  Bleeding, incomplete drainage and pain   Alternatives discussed:  No treatment, delayed treatment, alternative treatment and observation Universal protocol:    Procedure explained and questions answered to patient or proxy's satisfaction: yes     Patient identity  confirmed:  Verbally with patient and arm band Location:    Type:  Abscess   Size:  0.5 cm   Location:  Anogenital   Anogenital location:  Vulva Pre-procedure details:    Skin preparation:  Chlorhexidine with alcohol Sedation:    Sedation type:  None Anesthesia:    Anesthesia method:  Local infiltration   Local anesthetic:  Lidocaine 1% WITH epi Procedure type:    Complexity:  Simple Procedure details:    Ultrasound guidance: no     Incision types:  Single straight   Incision depth:  Dermal   Wound management:  Probed and deloculated   Drainage:  Bloody and purulent   Drainage amount:  Moderate   Wound treatment:  Wound left open   Packing materials:  None Post-procedure details:    Procedure completion:  Tolerated well, no immediate complications     Medications Ordered in ED Medications  lidocaine (PF) (XYLOCAINE) 1 % injection 5 mL (5 mLs Infiltration Given by Other 12/21/22 1224)  HYDROcodone-acetaminophen (NORCO/VICODIN) 5-325 MG per tablet 2 tablet (2 tablets Oral Given 12/21/22 1102)    ED Course/ Medical Decision Making/ A&P                             Medical Decision Making Amount and/or Complexity of Data Reviewed Labs: ordered.  Risk Prescription drug management.   This patient presents to the ED with chief complaint(s) of labial abscess or cyst with pertinent past medical history of previous vulvar boil.  The complaint involves an extensive differential diagnosis and also carries with it a high risk of complications and morbidity.    The differential diagnosis includes labial abscess, labial cyst, folliculitis, furuncle  Additional history obtained: Records reviewed  previous OBGYN documents where patient was treated before for vulvar boil, no I&D performed, was treated conservatively with antibiotics  Initial Assessment:   Exam significant for a 0.5 cm fluctuant labial mass that is erythematous and tender to palpation, concerning for a labial  abscess.   Treatment and Reassessment: I&D performed on labial abscess.  See procedure note for more detail.  Patient tolerated well with no immediate complications.  Will send patient home on topical clindamycin and recommended PCP or OBGYN follow-up.    Disposition:   The patient has been appropriately medically screened and/or stabilized in the ED. I have low suspicion for any other emergent medical condition which would require further screening, evaluation or treatment in the ED or require inpatient management. At time of discharge the patient is hemodynamically stable and in no acute distress. I have discussed work-up results and diagnosis with patient and answered all questions. Patient  is agreeable with discharge plan. We discussed strict return precautions for returning to the emergency department and they verbalized understanding.            Final Clinical Impression(s) / ED Diagnoses Final diagnoses:  Labial abscess    Rx / DC Orders ED Discharge Orders          Ordered    clindamycin (CLINDAGEL) 1 % gel  2 times daily        12/21/22 1240              Carlis Abbott [place], [place] 12/21/22 1310    Ezequiel Essex, MD 12/21/22 1607

## 2022-12-26 DIAGNOSIS — D473 Essential (hemorrhagic) thrombocythemia: Secondary | ICD-10-CM | POA: Diagnosis not present

## 2022-12-31 DIAGNOSIS — F4312 Post-traumatic stress disorder, chronic: Secondary | ICD-10-CM | POA: Diagnosis not present

## 2022-12-31 DIAGNOSIS — F411 Generalized anxiety disorder: Secondary | ICD-10-CM | POA: Diagnosis not present

## 2022-12-31 DIAGNOSIS — F900 Attention-deficit hyperactivity disorder, predominantly inattentive type: Secondary | ICD-10-CM | POA: Diagnosis not present

## 2022-12-31 DIAGNOSIS — F330 Major depressive disorder, recurrent, mild: Secondary | ICD-10-CM | POA: Diagnosis not present

## 2023-01-03 ENCOUNTER — Encounter: Payer: Commercial Managed Care - HMO | Admitting: Obstetrics and Gynecology

## 2023-01-21 DIAGNOSIS — F411 Generalized anxiety disorder: Secondary | ICD-10-CM | POA: Diagnosis not present

## 2023-01-21 DIAGNOSIS — F4312 Post-traumatic stress disorder, chronic: Secondary | ICD-10-CM | POA: Diagnosis not present

## 2023-01-21 DIAGNOSIS — F330 Major depressive disorder, recurrent, mild: Secondary | ICD-10-CM | POA: Diagnosis not present

## 2023-01-21 DIAGNOSIS — F900 Attention-deficit hyperactivity disorder, predominantly inattentive type: Secondary | ICD-10-CM | POA: Diagnosis not present

## 2023-01-24 ENCOUNTER — Emergency Department (HOSPITAL_COMMUNITY)
Admission: EM | Admit: 2023-01-24 | Discharge: 2023-01-25 | Disposition: A | Discharge status: Home / Self Care | Payer: Medicaid Other | Attending: Emergency Medicine | Admitting: Emergency Medicine

## 2023-01-24 ENCOUNTER — Other Ambulatory Visit: Payer: Self-pay

## 2023-01-24 ENCOUNTER — Emergency Department (HOSPITAL_COMMUNITY): Payer: Medicaid Other

## 2023-01-24 DIAGNOSIS — R0902 Hypoxemia: Secondary | ICD-10-CM | POA: Diagnosis not present

## 2023-01-24 DIAGNOSIS — R0789 Other chest pain: Secondary | ICD-10-CM | POA: Diagnosis not present

## 2023-01-24 DIAGNOSIS — R079 Chest pain, unspecified: Secondary | ICD-10-CM | POA: Diagnosis not present

## 2023-01-24 NOTE — ED Triage Notes (Signed)
BIB EMS from home.  Was watching TV and began having CP.  Hypertensive initially but resolved en route.  Described as 'pressure", "dull", "achy"  non radiating. No undue stress. No new physical activity.  NSR on EKG with EMS.  VSS 128/94, pulse 64, 96% on RA.  324 ASA given.

## 2023-01-25 DIAGNOSIS — R079 Chest pain, unspecified: Secondary | ICD-10-CM | POA: Diagnosis not present

## 2023-01-25 LAB — CBC
HCT: 41.3 % (ref 36.0–46.0)
Hemoglobin: 13.8 g/dL (ref 12.0–15.0)
MCH: 31.3 pg (ref 26.0–34.0)
MCHC: 33.4 g/dL (ref 30.0–36.0)
MCV: 93.7 fL (ref 80.0–100.0)
Platelets: 247 10*3/uL (ref 150–400)
RBC: 4.41 MIL/uL (ref 3.87–5.11)
RDW: 12.6 % (ref 11.5–15.5)
WBC: 9.9 10*3/uL (ref 4.0–10.5)
nRBC: 0 % (ref 0.0–0.2)

## 2023-01-25 LAB — BASIC METABOLIC PANEL
Anion gap: 8 (ref 5–15)
BUN: 13 mg/dL (ref 6–20)
CO2: 24 mmol/L (ref 22–32)
Calcium: 9.1 mg/dL (ref 8.9–10.3)
Chloride: 108 mmol/L (ref 98–111)
Creatinine, Ser: 0.67 mg/dL (ref 0.44–1.00)
GFR, Estimated: >60 mL/min (ref >60)
Glucose, Bld: 99 mg/dL (ref 70–99)
Potassium: 3.7 mmol/L (ref 3.5–5.1)
Sodium: 140 mmol/L (ref 135–145)

## 2023-01-25 LAB — TROPONIN I (HIGH SENSITIVITY)
Troponin I (High Sensitivity): 2 ng/L (ref <18)
Troponin I (High Sensitivity): <2 ng/L (ref <18)

## 2023-01-25 LAB — I-STAT BETA HCG BLOOD, ED (MC, WL, AP ONLY): I-stat hCG, quantitative: <5 m[IU]/mL (ref <5)

## 2023-01-25 MED ORDER — LIDOCAINE VISCOUS HCL 2 % MT SOLN
15.0000 mL | Freq: Once | OROMUCOSAL | Status: AC
  Administered 2023-01-25: 15 mL via ORAL
  Filled 2023-01-25: qty 15

## 2023-01-25 MED ORDER — ALUM & MAG HYDROXIDE-SIMETH 200-200-20 MG/5ML PO SUSP
30.0000 mL | Freq: Once | ORAL | Status: AC
  Administered 2023-01-25: 30 mL via ORAL
  Filled 2023-01-25: qty 30

## 2023-01-25 NOTE — ED Provider Notes (Signed)
[place] EMERGENCY DEPARTMENT AT [facility] Provider Note   CSN: [id number] Arrival date & time: 01/24/23  2310     History  Chief Complaint  Patient presents with   Chest Pain    Paula Warner is a 37 y.o. female.  37 year old female presents to the emergency department for evaluation of chest pain.  She was watching TV at home when she felt an aching, "burning" pain in the center of her chest.  She states that she has felt similar discomfort in the past, but tonight's episode lasted longer.  Pain was nonradiating without associated back pain, jaw pain, diaphoresis, syncope or near syncope, hemoptysis, leg swelling, or fever.  She was given 324 mg aspirin and route by EMS.  No recent surgeries or hospitalizations.  No family history of known sudden cardiac death at a young age.  The history is provided by the patient. No language interpreter was used.  Chest Pain      Home Medications Prior to Admission medications   Medication Sig Start Date End Date Taking? Authorizing Provider  clindamycin (CLEOCIN) 300 MG capsule Take 1 capsule (300 mg total) by mouth 3 (three) times daily. 11/20/22   Brock Bad, MD  clindamycin (CLINDAGEL) 1 % gel Apply topically 2 (two) times daily. 12/21/22   Clark, Meghan R, PA-C  ibuprofen (ADVIL) 800 MG tablet Take 1 tablet (800 mg total) by mouth every 8 (eight) hours as needed for headache. 11/01/22   Bethann Berkshire, MD  ketorolac (TORADOL) 10 MG tablet Take 1 tablet (10 mg total) by mouth every 8 (eight) hours as needed for moderate pain (or stent discomfort post-operatively). 04/11/22   Loletta Parish., MD  ondansetron (ZOFRAN-ODT) 8 MG disintegrating tablet Take 1 tablet (8 mg total) by mouth every 8 (eight) hours as needed for nausea or vomiting. Patient not taking: Reported on 04/09/2022 04/04/22   Theron Arista, PA-C  oxyCODONE-acetaminophen (PERCOCET/ROXICET) 5-325 MG tablet Take 1 tablet by mouth every 6 (six) hours as needed  for severe pain (post-operatively). 04/11/22   Manny, Delbert Phenix., MD      Allergies    Zinacef [cefuroxime]    Review of Systems   Review of Systems  Cardiovascular:  Positive for chest pain.  Ten systems reviewed and are negative for acute change, except as noted in the HPI.    Physical Exam Updated Vital Signs BP 112/80   Pulse 68   Temp 98.1 F (36.7 C) (Oral)   Resp 15   Ht 5\' 1"  (1.549 m)   Wt 70.3 kg   LMP  (Approximate) Comment: per pt, not pregnant. irregular cycles, has not had one in a while  SpO2 96%   BMI 29.29 kg/m   Physical Exam Vitals and nursing note reviewed.  Constitutional:      General: She is not in acute distress.    Appearance: She is well-developed. She is not diaphoretic.     Comments: Nontoxic.  In no acute distress  HENT:     Head: Normocephalic and atraumatic.  Eyes:     General: No scleral icterus.    Conjunctiva/sclera: Conjunctivae normal.  Cardiovascular:     Rate and Rhythm: Normal rate and regular rhythm.     Pulses: Normal pulses.  Pulmonary:     Effort: Pulmonary effort is normal. No respiratory distress.     Breath sounds: No stridor. No wheezing.     Comments: Respirations even and unlabored Musculoskeletal:  General: Normal range of motion.     Cervical back: Normal range of motion.  Skin:    General: Skin is warm and dry.     Coloration: Skin is not pale.     Findings: No erythema or rash.  Neurological:     Mental Status: She is alert and oriented to person, place, and time.     Coordination: Coordination normal.  Psychiatric:        Behavior: Behavior normal.     ED Results / Procedures / Treatments   Labs (all labs ordered are listed, but only abnormal results are displayed) Labs Reviewed  BASIC METABOLIC PANEL  CBC  I-STAT BETA HCG BLOOD, ED ([facility], [facility], [facility] ONLY)  TROPONIN I (HIGH SENSITIVITY)  TROPONIN I (HIGH SENSITIVITY)    EKG None  Radiology DG Chest 2 View  Result Date:  01/25/2023 CLINICAL DATA:  Chest pain EXAM: CHEST - 2 VIEW COMPARISON:  Chest x-ray 12/13/2021 FINDINGS: The heart size and mediastinal contours are within normal limits. Both lungs are clear. The visualized skeletal structures are unremarkable. IMPRESSION: No active cardiopulmonary disease. Electronically Signed   By: Darliss Cheney M.D.   On: 01/25/2023 00:01    Procedures Procedures    Medications Ordered in ED Medications  alum & mag hydroxide-simeth (MAALOX/MYLANTA) 200-200-20 MG/5ML suspension 30 mL (has no administration in time range)    And  lidocaine (XYLOCAINE) 2 % viscous mouth solution 15 mL (has no administration in time range)    ED Course/ Medical Decision Making/ A&P                             Medical Decision Making Amount and/or Complexity of Data Reviewed Labs: ordered. Radiology: ordered.  Risk OTC drugs. Prescription drug management.   This patient presents to the ED for concern of chest pain, this involves an extensive number of treatment options, and is a complaint that carries with it a high risk of complications and morbidity.  The differential diagnosis includes ACS vs pericarditis vs anxiety vs chest wall pain vs PTX vs PNA vs gastritis/PUD   Co morbidities that complicate the patient evaluation  Asthma Hx of DVT (felt 2/2 OCP use, now off birth control) May Thurner syndrome   Additional history obtained:  Additional history obtained from EMS External records from outside source obtained and reviewed including negative DVT study Feb 2023   Lab Tests:  I Ordered, and personally interpreted labs.  The pertinent results include:  normal CBC, BMP, troponin x2.   Imaging Studies ordered:  I ordered imaging studies including CXR  I independently visualized and interpreted imaging which showed no acute cardiopulmonary abnormality I agree with the radiologist interpretation   Cardiac Monitoring:  The patient was maintained on a cardiac  monitor.  I personally viewed and interpreted the cardiac monitored which showed an underlying rhythm of: NSR   Medicines ordered and prescription drug management:  I ordered medication including maalox and lidocaine for chest pain  I have reviewed the patients home medicines and have made adjustments as needed   Problem List / ED Course:  Low suspicion for emergent cardiac etiology given reassuring workup today.   EKG is nonischemic and troponin negative x2.  Chest x-ray without evidence of mediastinal widening to suggest dissection.  No pneumothorax, pneumonia, pleural effusion.     Reevaluation:  After the interventions noted above, I reevaluated the patient and found that they have :stayed the same  Dispostion:  After consideration of the diagnostic results and the patients response to treatment, I feel that the patent would benefit from outpatient PCP follow up for recheck. Return precautions discussed and provided. Patient discharged in stable condition with no unaddressed concerns.          Final Clinical Impression(s) / ED Diagnoses Final diagnoses:  Nonspecific chest pain    Rx / DC Orders ED Discharge Orders     None         Antony Madura, PA-C 01/25/23 1610    Shon Baton, MD 01/26/23 [phone]

## 2023-01-25 NOTE — Discharge Instructions (Addendum)
Your evaluation in the ED today was reassuring.  We recommend follow-up with your primary care doctor. Return for new or concerning symptoms.

## 2023-02-18 DIAGNOSIS — F411 Generalized anxiety disorder: Secondary | ICD-10-CM | POA: Diagnosis not present

## 2023-02-18 DIAGNOSIS — F4312 Post-traumatic stress disorder, chronic: Secondary | ICD-10-CM | POA: Diagnosis not present

## 2023-02-18 DIAGNOSIS — F900 Attention-deficit hyperactivity disorder, predominantly inattentive type: Secondary | ICD-10-CM | POA: Diagnosis not present

## 2023-02-18 DIAGNOSIS — F330 Major depressive disorder, recurrent, mild: Secondary | ICD-10-CM | POA: Diagnosis not present

## 2023-02-19 DIAGNOSIS — L02425 Furuncle of right lower limb: Secondary | ICD-10-CM | POA: Diagnosis not present

## 2023-02-19 DIAGNOSIS — L700 Acne vulgaris: Secondary | ICD-10-CM | POA: Diagnosis not present

## 2023-02-19 DIAGNOSIS — L680 Hirsutism: Secondary | ICD-10-CM | POA: Diagnosis not present

## 2023-02-19 DIAGNOSIS — L02426 Furuncle of left lower limb: Secondary | ICD-10-CM | POA: Diagnosis not present

## 2023-03-11 DIAGNOSIS — F4312 Post-traumatic stress disorder, chronic: Secondary | ICD-10-CM | POA: Diagnosis not present

## 2023-03-11 DIAGNOSIS — F900 Attention-deficit hyperactivity disorder, predominantly inattentive type: Secondary | ICD-10-CM | POA: Diagnosis not present

## 2023-03-11 DIAGNOSIS — F411 Generalized anxiety disorder: Secondary | ICD-10-CM | POA: Diagnosis not present

## 2023-03-11 DIAGNOSIS — F330 Major depressive disorder, recurrent, mild: Secondary | ICD-10-CM | POA: Diagnosis not present

## 2023-03-26 DIAGNOSIS — J029 Acute pharyngitis, unspecified: Secondary | ICD-10-CM | POA: Diagnosis not present

## 2023-03-26 DIAGNOSIS — U071 COVID-19: Secondary | ICD-10-CM | POA: Diagnosis not present

## 2023-04-06 DIAGNOSIS — Z79899 Other long term (current) drug therapy: Secondary | ICD-10-CM | POA: Diagnosis not present

## 2023-04-08 DIAGNOSIS — E049 Nontoxic goiter, unspecified: Secondary | ICD-10-CM | POA: Diagnosis not present

## 2023-04-08 DIAGNOSIS — F330 Major depressive disorder, recurrent, mild: Secondary | ICD-10-CM | POA: Diagnosis not present

## 2023-04-08 DIAGNOSIS — F411 Generalized anxiety disorder: Secondary | ICD-10-CM | POA: Diagnosis not present

## 2023-04-08 DIAGNOSIS — D473 Essential (hemorrhagic) thrombocythemia: Secondary | ICD-10-CM | POA: Diagnosis not present

## 2023-04-08 DIAGNOSIS — F4312 Post-traumatic stress disorder, chronic: Secondary | ICD-10-CM | POA: Diagnosis not present

## 2023-04-08 DIAGNOSIS — F900 Attention-deficit hyperactivity disorder, predominantly inattentive type: Secondary | ICD-10-CM | POA: Diagnosis not present

## 2023-04-09 DIAGNOSIS — A609 Anogenital herpesviral infection, unspecified: Secondary | ICD-10-CM | POA: Diagnosis not present

## 2023-04-09 DIAGNOSIS — Z01419 Encounter for gynecological examination (general) (routine) without abnormal findings: Secondary | ICD-10-CM | POA: Diagnosis not present

## 2023-04-11 DIAGNOSIS — D473 Essential (hemorrhagic) thrombocythemia: Secondary | ICD-10-CM | POA: Diagnosis not present

## 2023-04-13 ENCOUNTER — Emergency Department (HOSPITAL_COMMUNITY)
Admission: EM | Admit: 2023-04-13 | Discharge: 2023-04-13 | Disposition: A | Discharge status: Home / Self Care | Payer: BC Managed Care – PPO | Attending: Emergency Medicine | Admitting: Emergency Medicine

## 2023-04-13 ENCOUNTER — Other Ambulatory Visit: Payer: Self-pay

## 2023-04-13 DIAGNOSIS — N3000 Acute cystitis without hematuria: Secondary | ICD-10-CM | POA: Diagnosis not present

## 2023-04-13 DIAGNOSIS — R300 Dysuria: Secondary | ICD-10-CM | POA: Diagnosis not present

## 2023-04-13 DIAGNOSIS — J45909 Unspecified asthma, uncomplicated: Secondary | ICD-10-CM | POA: Insufficient documentation

## 2023-04-13 LAB — URINALYSIS, ROUTINE W REFLEX MICROSCOPIC
Bilirubin Urine: NEGATIVE
Glucose, UA: NEGATIVE mg/dL
Hgb urine dipstick: NEGATIVE
Ketones, ur: NEGATIVE mg/dL
Nitrite: NEGATIVE
Protein, ur: NEGATIVE mg/dL
Specific Gravity, Urine: 1.016 (ref 1.005–1.030)
WBC, UA: >50 WBC/hpf (ref 0–5)
pH: 8 (ref 5.0–8.0)

## 2023-04-13 LAB — WET PREP, GENITAL
Clue Cells Wet Prep HPF POC: NONE SEEN
Sperm: NONE SEEN
Trich, Wet Prep: NONE SEEN
WBC, Wet Prep HPF POC: >=10 — AB
Yeast Wet Prep HPF POC: NONE SEEN

## 2023-04-13 LAB — PREGNANCY, URINE: Preg Test, Ur: NEGATIVE

## 2023-04-13 MED ORDER — NITROFURANTOIN MONOHYD MACRO 100 MG PO CAPS: 100.0000 mg | ORAL_CAPSULE | Freq: Two times a day (BID) | ORAL | 0 refills | Status: DC

## 2023-04-13 MED ORDER — NITROFURANTOIN MONOHYD MACRO 100 MG PO CAPS
100.0000 mg | ORAL_CAPSULE | Freq: Once | ORAL | Status: AC
  Administered 2023-04-13: 100 mg via ORAL
  Filled 2023-04-13: qty 1

## 2023-04-13 MED ORDER — CLOTRIMAZOLE 1 % EX CREA: TOPICAL_CREAM | CUTANEOUS | 0 refills | Status: DC

## 2023-04-13 NOTE — ED Triage Notes (Signed)
Pt arrived via POV. C/o dysuria and urinary frequency for 1x days.  AOx4

## 2023-04-13 NOTE — Discharge Instructions (Addendum)
Is a pleasure caring for you today.  Your urine today was concerning for UTI.  I am sending you home with antibiotic.  Please take entire course of antibiotics even if symptoms resolve.  Seek emergency care if experiencing any new or worsening symptoms.

## 2023-04-13 NOTE — ED Provider Notes (Signed)
[place] EMERGENCY DEPARTMENT AT [facility] Provider Note   CSN: [id number] Arrival date & time: 04/13/23  1010     History  Chief Complaint  Patient presents with   Dysuria   Urinary Frequency    Paula Warner is a 37 y.o. female PMHx asthma who presents to ED concerned for dysuria and urinary frequency x1 day. Patient with UTI in the past and symptoms are similar. Also complaining of vaginal itching. One sexual partner in the past 12 months and is not concerned for STD. No vaginal discharge.  Denies fevers, chest pain, dyspnea, abdominal pain, nausea, vomiting, diarrhea.    Dysuria Urinary Frequency       Home Medications Prior to Admission medications   Medication Sig Start Date End Date Taking? Authorizing Provider  clotrimazole (LOTRIMIN) 1 % cream Apply to affected area 2 times daily 04/13/23  Yes Isiah Scheel F, PA-C  nitrofurantoin, macrocrystal-monohydrate, (MACROBID) 100 MG capsule Take 1 capsule (100 mg total) by mouth 2 (two) times daily for 5 days. 04/13/23 04/18/23 Yes Valrie Hart F, PA-C  clindamycin (CLEOCIN) 300 MG capsule Take 1 capsule (300 mg total) by mouth 3 (three) times daily. 11/20/22   Brock Bad, MD  clindamycin (CLINDAGEL) 1 % gel Apply topically 2 (two) times daily. 12/21/22   Clark, Meghan R, PA-C  ibuprofen (ADVIL) 800 MG tablet Take 1 tablet (800 mg total) by mouth every 8 (eight) hours as needed for headache. 11/01/22   Bethann Berkshire, MD  ketorolac (TORADOL) 10 MG tablet Take 1 tablet (10 mg total) by mouth every 8 (eight) hours as needed for moderate pain (or stent discomfort post-operatively). 04/11/22   Loletta Parish., MD  ondansetron (ZOFRAN-ODT) 8 MG disintegrating tablet Take 1 tablet (8 mg total) by mouth every 8 (eight) hours as needed for nausea or vomiting. Patient not taking: Reported on 04/09/2022 04/04/22   Theron Arista, PA-C  oxyCODONE-acetaminophen (PERCOCET/ROXICET) 5-325 MG tablet Take 1 tablet  by mouth every 6 (six) hours as needed for severe pain (post-operatively). 04/11/22   Manny, Delbert Phenix., MD      Allergies    Zinacef [cefuroxime]    Review of Systems   Review of Systems  Genitourinary:  Positive for dysuria and frequency.    Physical Exam Updated Vital Signs BP 110/73 (BP Location: Right Arm)   Pulse 63   Temp 98 F (36.7 C) (Oral)   Resp 16   Ht 5\' 1"  (1.549 m)   Wt 68 kg   SpO2 95%   BMI 28.34 kg/m  Physical Exam Vitals and nursing note reviewed.  Constitutional:      General: She is not in acute distress.    Appearance: She is not ill-appearing or toxic-appearing.  HENT:     Head: Normocephalic and atraumatic.     Mouth/Throat:     Mouth: Mucous membranes are moist.  Eyes:     General: No scleral icterus.       Right eye: No discharge.        Left eye: No discharge.     Conjunctiva/sclera: Conjunctivae normal.  Cardiovascular:     Rate and Rhythm: Normal rate.     Pulses: Normal pulses.     Heart sounds: Normal heart sounds. No murmur heard. Pulmonary:     Effort: Pulmonary effort is normal.  Abdominal:     General: Abdomen is flat. Bowel sounds are normal. There is no distension.     Palpations: Abdomen is  soft. There is no mass.     Tenderness: There is no abdominal tenderness. There is no right CVA tenderness or left CVA tenderness.  Musculoskeletal:     Right lower leg: No edema.     Left lower leg: No edema.  Skin:    General: Skin is warm and dry.     Capillary Refill: Capillary refill takes less than 2 seconds.     Findings: No rash.  Neurological:     General: No focal deficit present.     Mental Status: She is alert and oriented to person, place, and time. Mental status is at baseline.  Psychiatric:        Mood and Affect: Mood normal.        Behavior: Behavior normal.     ED Results / Procedures / Treatments   Labs (all labs ordered are listed, but only abnormal results are displayed) Labs Reviewed  WET PREP,  GENITAL - Abnormal; Notable for the following components:      Result Value   WBC, Wet Prep HPF POC >=10 (*)    All other components within normal limits  URINALYSIS, ROUTINE W REFLEX MICROSCOPIC - Abnormal; Notable for the following components:   APPearance HAZY (*)    Leukocytes,Ua LARGE (*)    Bacteria, UA RARE (*)    All other components within normal limits  PREGNANCY, URINE  GC/CHLAMYDIA PROBE AMP ([place]) NOT AT [facility]    EKG None  Radiology No results found.  Procedures Procedures    Medications Ordered in ED Medications  nitrofurantoin (macrocrystal-monohydrate) (MACROBID) capsule 100 mg (100 mg Oral Given 04/13/23 1214)    ED Course/ Medical Decision Making/ A&P                             Medical Decision Making Amount and/or Complexity of Data Reviewed Labs: ordered.    This patient presents to the ED for concern of dysuria and urinary frequency, this involves an extensive number of treatment options, and is a complaint that carries with it a high risk of complications and morbidity.  The differential diagnosis includes UTI, pyleonephritis, yeast infection, BV, STD   Co morbidities that complicate the patient evaluation  none   Lab Tests:  I Ordered, and personally interpreted labs.  The pertinent results include:   Wet prep: WBC >10 UA: large leukocytes Urine Pregnancy: negative   Problem List / ED Course / Critical interventions / Medication management  Patient presents to ED concerned for dysuria and urinary frequency.  Denying hematuria.  Physical exam without tenderness to palpation of abdomen.  No CVA tenderness.  Patient afebrile with stable vitals.  Patient stating the symptoms are similar to her past UTI. UA with large leukocytes.  Wet prep with >10 WBC.  Preg negative.  Provided patient with a dose of nitrofurantoin here in the emergency room which she tolerated well.  Sending rest prescription to her pharmacy.  Patient also endorsing  vaginal itching.  Denies vaginal discharge.  Denies any concern for STD.  Patient specifically requesting medication for vaginal itching.  Wet prep without yeast or clue cells - provided patient with clotrimazole for symptomatic treatment.  Patient without any concern for STD - but requesting GC/chlamydia testing today.  Testing patient for GC/chlamydia.  Educated patient that if test come back positive she will need follow-up with department of public health.  Patient verbally endorsed understanding of plan. I have reviewed the  patients home medicines and have made adjustments as needed Patient was given return precautions. Patient stable for discharge at this time.  Patient verbalized understanding of plan.  Ddx: These are considered less likely due to history of present illness and physical exam. -nephrolithiasis: Denies flank pain and urinary complaints  -UTI/pyelonephritis: Denies urinary complaints     Social Determinants of Health:  none           Final Clinical Impression(s) / ED Diagnoses Final diagnoses:  Acute cystitis without hematuria    Rx / DC Orders ED Discharge Orders          Ordered    clotrimazole (LOTRIMIN) 1 % cream        04/13/23 1159    nitrofurantoin, macrocrystal-monohydrate, (MACROBID) 100 MG capsule  2 times daily        04/13/23 1210              Dorthy Cooler, [place] 04/13/23 1217    Lorre Nick, MD 04/14/23 [phone]

## 2023-04-14 ENCOUNTER — Encounter (HOSPITAL_COMMUNITY): Payer: Self-pay

## 2023-04-14 ENCOUNTER — Emergency Department (HOSPITAL_COMMUNITY)
Admission: EM | Admit: 2023-04-14 | Discharge: 2023-04-14 | Disposition: A | Discharge status: Home / Self Care | Payer: BC Managed Care – PPO | Attending: Emergency Medicine | Admitting: Emergency Medicine

## 2023-04-14 DIAGNOSIS — L292 Pruritus vulvae: Secondary | ICD-10-CM | POA: Diagnosis not present

## 2023-04-14 DIAGNOSIS — J45909 Unspecified asthma, uncomplicated: Secondary | ICD-10-CM | POA: Insufficient documentation

## 2023-04-14 DIAGNOSIS — N898 Other specified noninflammatory disorders of vagina: Secondary | ICD-10-CM | POA: Diagnosis not present

## 2023-04-14 LAB — WET PREP, GENITAL
Clue Cells Wet Prep HPF POC: NONE SEEN
Sperm: NONE SEEN
Trich, Wet Prep: NONE SEEN
WBC, Wet Prep HPF POC: >=10 — AB (ref <10)
Yeast Wet Prep HPF POC: NONE SEEN

## 2023-04-14 LAB — URINALYSIS, ROUTINE W REFLEX MICROSCOPIC
Bacteria, UA: NONE SEEN
Bilirubin Urine: NEGATIVE
Glucose, UA: NEGATIVE mg/dL
Hgb urine dipstick: NEGATIVE
Ketones, ur: NEGATIVE mg/dL
Nitrite: NEGATIVE
Protein, ur: NEGATIVE mg/dL
Specific Gravity, Urine: 1.024 (ref 1.005–1.030)
pH: 5 (ref 5.0–8.0)

## 2023-04-14 LAB — PREGNANCY, URINE: Preg Test, Ur: NEGATIVE

## 2023-04-14 MED ORDER — DIPHENHYDRAMINE HCL 25 MG PO CAPS
25.0000 mg | ORAL_CAPSULE | Freq: Once | ORAL | Status: AC
  Administered 2023-04-14: 25 mg via ORAL
  Filled 2023-04-14: qty 1

## 2023-04-14 NOTE — ED Triage Notes (Signed)
Pt arrived via POV , c/o vaginal itching. Was seen yesterday, worsening sx.

## 2023-04-14 NOTE — Discharge Instructions (Signed)
You were seen in the emergency department for your vaginal itching.  He had no obvious rash and no signs of yeast or UTI on your exam.  You can complete your prior UTI antibiotics as prescribed.  You did have a gonorrhea and Chlamydia test sent yesterday and if these come back positive you will receive a call tomorrow to be given treatment.  You can take Benadryl as needed for itching and you can use lubricant or Vaseline as a barrier to help with the itching.  You should follow-up with your GYN to have your symptoms rechecked.  You should return to the emergency department if you have severe abdominal pain, fevers, repetitive vomiting or any other new or concerning symptoms.

## 2023-04-14 NOTE — ED Provider Notes (Signed)
[place] EMERGENCY DEPARTMENT AT [facility] Provider Note   CSN: [id number] Arrival date & time: 04/14/23  4098     History  Chief Complaint  Patient presents with   Vaginal Itching    Paula Warner is a 37 y.o. female.  Patient is a 37 year old female with a past medical history of asthma presenting to the emergency department with vaginal itching.  The patient states that she was on amoxicillin last week for a dental infection and started to develop itching on Thursday.  She states that she had some associated dysuria but denies any hematuria.  She was seen in the emergency department yesterday and was diagnosed with a UTI and started on Macrobid and was also given clotrimazole cream to help with the itching though her wet prep was negative for yeast.  She states that she has been using the cream and has had no improvement.  She denies any vaginal discharge, fevers or chills, nausea or vomiting.  The history is provided by the patient.  Vaginal Itching       Home Medications Prior to Admission medications   Medication Sig Start Date End Date Taking? Authorizing Provider  clindamycin (CLEOCIN) 300 MG capsule Take 1 capsule (300 mg total) by mouth 3 (three) times daily. 11/20/22   Brock Bad, MD  clindamycin (CLINDAGEL) 1 % gel Apply topically 2 (two) times daily. 12/21/22   Melton Alar R, PA-C  clotrimazole (LOTRIMIN) 1 % cream Apply to affected area 2 times daily 04/13/23   Valrie Hart F, PA-C  ibuprofen (ADVIL) 800 MG tablet Take 1 tablet (800 mg total) by mouth every 8 (eight) hours as needed for headache. 11/01/22   Bethann Berkshire, MD  ketorolac (TORADOL) 10 MG tablet Take 1 tablet (10 mg total) by mouth every 8 (eight) hours as needed for moderate pain (or stent discomfort post-operatively). 04/11/22   Loletta Parish., MD  nitrofurantoin, macrocrystal-monohydrate, (MACROBID) 100 MG capsule Take 1 capsule (100 mg total) by mouth 2 (two) times  daily for 5 days. 04/13/23 04/18/23  Dorthy Cooler, PA-C  ondansetron (ZOFRAN-ODT) 8 MG disintegrating tablet Take 1 tablet (8 mg total) by mouth every 8 (eight) hours as needed for nausea or vomiting. Patient not taking: Reported on 04/09/2022 04/04/22   Theron Arista, PA-C  oxyCODONE-acetaminophen (PERCOCET/ROXICET) 5-325 MG tablet Take 1 tablet by mouth every 6 (six) hours as needed for severe pain (post-operatively). 04/11/22   Loletta Parish., MD      Allergies    Zinacef [cefuroxime]    Review of Systems   Review of Systems  Physical Exam Updated Vital Signs BP 106/66 (BP Location: Left Arm)   Pulse 61   Temp 98.5 F (36.9 C) (Oral)   Resp 18   SpO2 98%  Physical Exam Vitals and nursing note reviewed.  Constitutional:      General: She is not in acute distress.    Appearance: Normal appearance.  HENT:     Head: Normocephalic and atraumatic.     Nose: Nose normal.     Mouth/Throat:     Mouth: Mucous membranes are moist.     Pharynx: Oropharynx is clear.  Eyes:     Extraocular Movements: Extraocular movements intact.     Conjunctiva/sclera: Conjunctivae normal.  Cardiovascular:     Rate and Rhythm: Normal rate and regular rhythm.     Heart sounds: Normal heart sounds.  Pulmonary:     Effort: Pulmonary effort is normal.  Breath sounds: Normal breath sounds.  Abdominal:     General: Abdomen is flat.     Palpations: Abdomen is soft.     Tenderness: There is no abdominal tenderness.  Musculoskeletal:        General: Normal range of motion.     Cervical back: Normal range of motion.  Skin:    General: Skin is warm and dry.  Neurological:     General: No focal deficit present.     Mental Status: She is alert and oriented to person, place, and time.  Psychiatric:        Mood and Affect: Mood normal.        Behavior: Behavior normal.     ED Results / Procedures / Treatments   Labs (all labs ordered are listed, but only abnormal results are  displayed) Labs Reviewed  WET PREP, GENITAL - Abnormal; Notable for the following components:      Result Value   WBC, Wet Prep HPF POC >=10 (*)    All other components within normal limits  URINALYSIS, ROUTINE W REFLEX MICROSCOPIC - Abnormal; Notable for the following components:   APPearance HAZY (*)    Leukocytes,Ua LARGE (*)    All other components within normal limits  PREGNANCY, URINE    EKG None  Radiology No results found.  Procedures Procedures    Medications Ordered in ED Medications  diphenhydrAMINE (BENADRYL) capsule 25 mg (25 mg Oral Given 04/14/23 1002)    ED Course/ Medical Decision Making/ A&P Clinical Course as of 04/14/23 1311  Sun Apr 14, 2023  1230 Wet prep negative for yeast, again positive for WBC. Will be offered prophylactic GC/Chlam treatment. [VK]  1309 Patient declined prophylactic STI testing and states that she has GYN to follow-up with.  She was recommended continued Benadryl and lubricant for symptomatic management and is stable for discharge home. [VK]    Clinical Course User Index [VK] Rexford Maus, DO                             Medical Decision Making This patient presents to the ED with chief complaint(s) of vaginal itching with pertinent past medical history of asthma which further complicates the presenting complaint. The complaint involves an extensive differential diagnosis and also carries with it a high risk of complications and morbidity.    The differential diagnosis includes STI, vaginitis, contact dermatitis  Additional history obtained: Additional history obtained from N/A Records reviewed recent ED records  ED Course and Reassessment: On patient's arrival she is hemodynamically stable in no acute distress.  She had repeat urine performed that showed leuks but no bacteria or other signs of UTI and pregnancy test was negative.  I reviewed wet prep from yesterday that showed WBC but no evidence of yeast, trichomonads  or BV.  Patient's GC chlamydia was sent out and is pending.  The patient is agreeable for repeat testing and pelvic exam to evaluate for any rash or other skin abnormality.  She will be given Benadryl for her itching and will be closely reassessed.  Independent labs interpretation:  The following labs were independently interpreted: Wet prep positive for WBC, otherwise normal  Independent visualization of imaging: - N/A  Consultation: - Consulted or discussed management/test interpretation w/ external professional: N/A  Consideration for admission or further workup: Patient has no emergent conditions requiring admission or further work-up at this time and is stable for discharge home with  primary care follow-up  Social Determinants of health: N/A    Amount and/or Complexity of Data Reviewed Labs: ordered.          Final Clinical Impression(s) / ED Diagnoses Final diagnoses:  Vaginal itching    Rx / DC Orders ED Discharge Orders     None         Rexford Maus, DO 04/14/23 1311

## 2023-04-15 ENCOUNTER — Other Ambulatory Visit (HOSPITAL_COMMUNITY)
Admission: RE | Admit: 2023-04-15 | Discharge: 2023-04-15 | Disposition: A | Discharge status: Home / Self Care | Payer: BC Managed Care – PPO | Source: Ambulatory Visit | Attending: Obstetrics and Gynecology | Admitting: Obstetrics and Gynecology

## 2023-04-15 ENCOUNTER — Ambulatory Visit (INDEPENDENT_AMBULATORY_CARE_PROVIDER_SITE_OTHER): Payer: BC Managed Care – PPO | Admitting: Obstetrics and Gynecology

## 2023-04-15 ENCOUNTER — Encounter: Payer: Self-pay | Admitting: Obstetrics and Gynecology

## 2023-04-15 VITALS — BP 134/91 | HR 73 | Ht 61.0 in | Wt 166.0 lb

## 2023-04-15 DIAGNOSIS — L292 Pruritus vulvae: Secondary | ICD-10-CM | POA: Insufficient documentation

## 2023-04-15 DIAGNOSIS — B3731 Acute candidiasis of vulva and vagina: Secondary | ICD-10-CM | POA: Diagnosis not present

## 2023-04-15 LAB — GC/CHLAMYDIA PROBE AMP (~~LOC~~) NOT AT ARMC
Chlamydia: NEGATIVE
Comment: NEGATIVE
Comment: NORMAL
Neisseria Gonorrhea: NEGATIVE

## 2023-04-15 MED ORDER — FLUCONAZOLE 150 MG PO TABS: 150.0000 mg | ORAL_TABLET | Freq: Once | ORAL | 0 refills | Status: AC

## 2023-04-15 NOTE — Progress Notes (Signed)
Pt presents for vaginal itching and dysuria x3. Current tx for UTI. Pt finished abx course on Sunday for dental.

## 2023-04-15 NOTE — Progress Notes (Signed)
   RETURN GYNECOLOGY VISIT  Subjective:  Paula Warner is a 37 y.o. [id number] with LMP 04/01/23 presenting for vulvovaginal itching  ED follow up from 7/20 - dysuria & urinary frequency x 1 day, vaginal itching. UA w/ large LE, neg nitrite. Wet prep w/ WBCs. UPT neg. GC/CT neg. Discharged w/ lotrimin cream, macrobid.  Returned to ED 7/21 with persistent itching. Testing unremarkable and pt discharged home.  Pt reports no urinary symptoms today but continued itching despite lotrimin cream. Also started using OTC vagisil cream. Has not started antibiotics for UTI.   I personally reviewed the following from 04/13/23: - Note by Dr. Freida Busman  - Urinalysis with result as outlined above - Wet prep with result as outlined above - UPT negative - GC/CT negative  I personally reviewed the following from 04/14/23: - Wet prep +WBCs - UA +LE/neg nitrite - UPT neg - Note by Dr. Theresia Lo  Objective:   Vitals:   04/15/23 1457  BP: (!) 134/91  Pulse: 73  Weight: 166 lb (75.3 kg)  Height: 5\' 1"  (1.549 m)    General:  Alert, oriented and cooperative. Patient is in no acute distress.  Skin: Skin is warm and dry. No rash noted.   Cardiovascular: Normal heart rate noted  Respiratory: Normal respiratory effort, no problems with respiration noted  Abdomen: Soft, non-tender, non-distended   Pelvic: NEFG.   Exam performed in the presence of a chaperone  Assessment and Plan:  Paula Warner is a 37 y.o. with suspected vulvovaginal candidiasis  1. Vulvar itching, VVC Will treat empirically w/ diflucan  Reviewed vulvar care/hygiene - recommended stopping vagisil, avoidance of douching or scented/perfumed products - Cervicovaginal ancillary only( [place]) -    fluconazole (DIFLUCAN) 150 MG tablet; Take 1 tablet (150 mg total) by mouth once for 1 dose.  Pt also mentioned irregular periods - discussed that she should track so we can discuss at future appt if truly irregular.   Return if symptoms  worsen or fail to improve by Thursday.  No future appointments.  Lennart Pall, MD

## 2023-04-16 LAB — CERVICOVAGINAL ANCILLARY ONLY
Candida Glabrata: NEGATIVE
Candida Vaginitis: POSITIVE — AB
Comment: NEGATIVE
Comment: NEGATIVE

## 2023-04-18 DIAGNOSIS — L700 Acne vulgaris: Secondary | ICD-10-CM | POA: Diagnosis not present

## 2023-04-18 DIAGNOSIS — L680 Hirsutism: Secondary | ICD-10-CM | POA: Diagnosis not present

## 2023-04-18 DIAGNOSIS — L02426 Furuncle of left lower limb: Secondary | ICD-10-CM | POA: Diagnosis not present

## 2023-04-18 DIAGNOSIS — L02425 Furuncle of right lower limb: Secondary | ICD-10-CM | POA: Diagnosis not present

## 2023-05-06 DIAGNOSIS — F330 Major depressive disorder, recurrent, mild: Secondary | ICD-10-CM | POA: Diagnosis not present

## 2023-05-06 DIAGNOSIS — F4312 Post-traumatic stress disorder, chronic: Secondary | ICD-10-CM | POA: Diagnosis not present

## 2023-05-06 DIAGNOSIS — F900 Attention-deficit hyperactivity disorder, predominantly inattentive type: Secondary | ICD-10-CM | POA: Diagnosis not present

## 2023-05-06 DIAGNOSIS — F411 Generalized anxiety disorder: Secondary | ICD-10-CM | POA: Diagnosis not present

## 2023-06-11 DIAGNOSIS — N3000 Acute cystitis without hematuria: Secondary | ICD-10-CM | POA: Diagnosis not present

## 2023-06-11 DIAGNOSIS — R350 Frequency of micturition: Secondary | ICD-10-CM | POA: Diagnosis not present

## 2023-06-15 ENCOUNTER — Encounter (HOSPITAL_COMMUNITY): Payer: Self-pay

## 2023-06-15 ENCOUNTER — Emergency Department (HOSPITAL_COMMUNITY)
Admission: EM | Admit: 2023-06-15 | Discharge: 2023-06-15 | Disposition: A | Discharge status: Home / Self Care | Payer: Medicaid Other | Attending: Emergency Medicine | Admitting: Emergency Medicine

## 2023-06-15 DIAGNOSIS — Z23 Encounter for immunization: Secondary | ICD-10-CM | POA: Diagnosis not present

## 2023-06-15 DIAGNOSIS — N764 Abscess of vulva: Secondary | ICD-10-CM | POA: Diagnosis not present

## 2023-06-15 MED ORDER — DOXYCYCLINE HYCLATE 100 MG PO CAPS: 100.0000 mg | ORAL_CAPSULE | Freq: Two times a day (BID) | ORAL | 0 refills | Status: DC

## 2023-06-15 MED ORDER — DOXYCYCLINE HYCLATE 100 MG PO TABS
100.0000 mg | ORAL_TABLET | Freq: Once | ORAL | Status: AC
  Administered 2023-06-15: 100 mg via ORAL
  Filled 2023-06-15: qty 1

## 2023-06-15 MED ORDER — TETANUS-DIPHTH-ACELL PERTUSSIS 5-2.5-18.5 LF-MCG/0.5 IM SUSY
0.5000 mL | PREFILLED_SYRINGE | Freq: Once | INTRAMUSCULAR | Status: AC
  Administered 2023-06-15: 0.5 mL via INTRAMUSCULAR
  Filled 2023-06-15: qty 0.5

## 2023-06-15 MED ORDER — LIDOCAINE HCL (PF) 1 % IJ SOLN
5.0000 mL | Freq: Once | INTRAMUSCULAR | Status: AC
  Administered 2023-06-15: 5 mL
  Filled 2023-06-15: qty 30

## 2023-06-15 NOTE — ED Provider Notes (Signed)
[place] EMERGENCY DEPARTMENT AT [facility] Provider Note   CSN: [id number] Arrival date & time: 06/15/23  0750     History  Chief Complaint  Patient presents with   Cyst   HPI Paula Warner is a 37 y.o. female presenting for cyst on her labia.  Located on the left labia.  States she noticed it a couple days ago.  Endorses pain and swelling in that area but no drainage.  Reports labial abscess on the right labia and March.  Required I&D and antibiotics at that time. Unsure of last tetanus shot.  Denies fever and chills.  Denies abnormal vaginal bleeding or discharge.  HPI     Home Medications Prior to Admission medications   Medication Sig Start Date End Date Taking? Authorizing Provider  doxycycline (VIBRAMYCIN) 100 MG capsule Take 1 capsule (100 mg total) by mouth 2 (two) times daily. 06/15/23  Yes Gareth Eagle, PA-C  clindamycin (CLEOCIN) 300 MG capsule Take 1 capsule (300 mg total) by mouth 3 (three) times daily. 11/20/22   Brock Bad, MD  clindamycin (CLINDAGEL) 1 % gel Apply topically 2 (two) times daily. 12/21/22   Melton Alar R, PA-C  clotrimazole (LOTRIMIN) 1 % cream Apply to affected area 2 times daily 04/13/23   Valrie Hart F, PA-C  ibuprofen (ADVIL) 800 MG tablet Take 1 tablet (800 mg total) by mouth every 8 (eight) hours as needed for headache. 11/01/22   Bethann Berkshire, MD      Allergies    Zinacef [cefuroxime]    Review of Systems   See HPI for pertinent positives  Physical Exam Updated Vital Signs BP 122/83   Pulse 68   Temp 97.9 F (36.6 C)   Resp 18   SpO2 100%  Physical Exam Constitutional:      Appearance: Normal appearance.  HENT:     Head: Normocephalic.     Nose: Nose normal.  Eyes:     Conjunctiva/sclera: Conjunctivae normal.  Pulmonary:     Effort: Pulmonary effort is normal.  Genitourinary:   Neurological:     Mental Status: She is alert.  Psychiatric:        Mood and Affect: Mood normal.     ED  Results / Procedures / Treatments   Labs (all labs ordered are listed, but only abnormal results are displayed) Labs Reviewed - No data to display  EKG None  Radiology No results found.  Procedures .Marland KitchenIncision and Drainage  Date/Time: 06/15/2023 10:46 AM  Performed by: Gareth Eagle, PA-C Authorized by: Gareth Eagle, PA-C   Consent:    Consent obtained:  Verbal   Consent given by:  Patient   Risks discussed:  Bleeding, incomplete drainage, pain and damage to other organs   Alternatives discussed:  No treatment Universal protocol:    Procedure explained and questions answered to patient or proxy's satisfaction: yes     Relevant documents present and verified: yes     Test results available : yes     Imaging studies available: yes     Required blood products, implants, devices, and special equipment available: yes     Site/side marked: yes     Immediately prior to procedure, a time out was called: yes     Patient identity confirmed:  Verbally with patient Location:    Type:  Abscess   Location:  Anogenital   Anogenital location: left labia. Pre-procedure details:    Skin preparation:  Betadine Anesthesia:  Anesthesia method:  Local infiltration   Local anesthetic:  Lidocaine 1% WITH epi Procedure type:    Complexity:  Complex Procedure details:    Incision types:  Single straight   Incision depth:  Subcutaneous   Wound management:  Probed and deloculated, irrigated with saline and extensive cleaning   Drainage:  Purulent   Drainage amount:  Moderate   Packing materials:  None Post-procedure details:    Procedure completion:  Tolerated well, no immediate complications     Medications Ordered in ED Medications  Tdap (BOOSTRIX) injection 0.5 mL (has no administration in time range)  doxycycline (VIBRA-TABS) tablet 100 mg (has no administration in time range)  lidocaine (PF) (XYLOCAINE) 1 % injection 5 mL (5 mLs Infiltration Given by Other 06/15/23 1039)     ED Course/ Medical Decision Making/ A&P                                 Medical Decision Making Risk Prescription drug management.   37 year old well-appearing female presenting for mass on her left labia.  Exam findings concerning for left labial abscess.  I&D procedure went well without complication.  Advised conservative postprocedure treatment and care at home.  Started on doxycycline.  Advised to follow-up with her PCP. Discussed return precautions.  Vital stable.  Discharged home in condition.        Final Clinical Impression(s) / ED Diagnoses Final diagnoses:  Labial abscess    Rx / DC Orders ED Discharge Orders          Ordered    doxycycline (VIBRAMYCIN) 100 MG capsule  2 times daily        06/15/23 1048              Vaughan Browner 06/15/23 1049    Lorre Nick, MD 06/17/23 [phone]

## 2023-06-15 NOTE — Discharge Instructions (Addendum)
Evaluation today revealed he had a labial abscess.  Incision and drainage procedure went well.  Recommend he continue to keep the wound clean with soap and water every day.  Also starting on doxycycline which is an antibiotic to prevent infection.  Recommend you follow-up with your PCP.  If there is concern for infection please return emergency department for further evaluation.

## 2023-06-15 NOTE — ED Triage Notes (Signed)
Pt presents with c/o cyst on her labia. Pt reports she has had this issue before. Pt reports the area is swollen, no drainage.

## 2023-06-18 DIAGNOSIS — E6609 Other obesity due to excess calories: Secondary | ICD-10-CM | POA: Diagnosis not present

## 2023-06-18 DIAGNOSIS — E789 Disorder of lipoprotein metabolism, unspecified: Secondary | ICD-10-CM | POA: Diagnosis not present

## 2023-06-18 DIAGNOSIS — E559 Vitamin D deficiency, unspecified: Secondary | ICD-10-CM | POA: Diagnosis not present

## 2023-06-18 DIAGNOSIS — N907 Vulvar cyst: Secondary | ICD-10-CM | POA: Diagnosis not present

## 2023-06-18 DIAGNOSIS — R7989 Other specified abnormal findings of blood chemistry: Secondary | ICD-10-CM | POA: Diagnosis not present

## 2023-06-18 DIAGNOSIS — Z6831 Body mass index (BMI) 31.0-31.9, adult: Secondary | ICD-10-CM | POA: Diagnosis not present

## 2023-06-18 DIAGNOSIS — R110 Nausea: Secondary | ICD-10-CM | POA: Diagnosis not present

## 2023-07-03 ENCOUNTER — Emergency Department (HOSPITAL_COMMUNITY): Payer: Medicaid Other

## 2023-07-03 ENCOUNTER — Other Ambulatory Visit: Payer: Self-pay

## 2023-07-03 ENCOUNTER — Emergency Department (HOSPITAL_COMMUNITY)
Admission: EM | Admit: 2023-07-03 | Discharge: 2023-07-04 | Discharge status: Against Medical Advice | Payer: Medicaid Other | Attending: Emergency Medicine | Admitting: Emergency Medicine

## 2023-07-03 ENCOUNTER — Encounter (HOSPITAL_COMMUNITY): Payer: Self-pay

## 2023-07-03 DIAGNOSIS — R519 Headache, unspecified: Secondary | ICD-10-CM | POA: Insufficient documentation

## 2023-07-03 DIAGNOSIS — R079 Chest pain, unspecified: Secondary | ICD-10-CM | POA: Insufficient documentation

## 2023-07-03 DIAGNOSIS — Z5321 Procedure and treatment not carried out due to patient leaving prior to being seen by health care provider: Secondary | ICD-10-CM | POA: Diagnosis not present

## 2023-07-03 DIAGNOSIS — R0789 Other chest pain: Secondary | ICD-10-CM | POA: Diagnosis not present

## 2023-07-03 LAB — CBC
HCT: 42.7 % (ref 36.0–46.0)
Hemoglobin: 14 g/dL (ref 12.0–15.0)
MCH: 31.8 pg (ref 26.0–34.0)
MCHC: 32.8 g/dL (ref 30.0–36.0)
MCV: 97 fL (ref 80.0–100.0)
Platelets: 262 10*3/uL (ref 150–400)
RBC: 4.4 MIL/uL (ref 3.87–5.11)
RDW: 12.4 % (ref 11.5–15.5)
WBC: 11.6 10*3/uL — ABNORMAL HIGH (ref 4.0–10.5)
nRBC: 0 % (ref 0.0–0.2)

## 2023-07-03 LAB — BASIC METABOLIC PANEL
Anion gap: 8 (ref 5–15)
BUN: 17 mg/dL (ref 6–20)
CO2: 27 mmol/L (ref 22–32)
Calcium: 9.3 mg/dL (ref 8.9–10.3)
Chloride: 106 mmol/L (ref 98–111)
Creatinine, Ser: 0.62 mg/dL (ref 0.44–1.00)
GFR, Estimated: >60 mL/min (ref >60)
Glucose, Bld: 91 mg/dL (ref 70–99)
Potassium: 3.7 mmol/L (ref 3.5–5.1)
Sodium: 141 mmol/L (ref 135–145)

## 2023-07-03 LAB — HCG, SERUM, QUALITATIVE: Preg, Serum: NEGATIVE

## 2023-07-03 LAB — TROPONIN I (HIGH SENSITIVITY)
Troponin I (High Sensitivity): <2 ng/L (ref <18)
Troponin I (High Sensitivity): 2 ng/L (ref <18)

## 2023-07-03 MED ORDER — ACETAMINOPHEN 500 MG PO TABS
1000.0000 mg | ORAL_TABLET | Freq: Once | ORAL | Status: AC
  Administered 2023-07-03: 1000 mg via ORAL
  Filled 2023-07-03: qty 2

## 2023-07-03 NOTE — ED Triage Notes (Signed)
Patient reports chest pain in the central chest that  radiates to the left side along with headache. States the pain is sharp and moving, with a burning sensation. Patient states she has had chest pain in the past but it has never felt like this. Patient rates pain 6/10. Also reports headache 5/10. Denies taking any medicine. Denies blood thinners.

## 2023-07-03 NOTE — ED Provider Triage Note (Cosign Needed)
Emergency Medicine Provider Triage Evaluation Note  Paula Warner , a 37 y.o. female  was evaluated in triage.  Pt complains of chest pain in the middle of her chest that has been coming and going for the past 2 days, nonexertional.  Denies any cough, shortness of breath, abdominal pain, nausea or vomiting.  Also reports a headache that started today.  It was gradual in onset.  Patient has not tried any medication to help with her headache.  Denies any changes in vision, fever or chills, neck stiffness.  Review of Systems  Positive: As above Negative: As above  Physical Exam  BP 123/78 (BP Location: Right Arm)   Pulse 66   Temp 98.7 F (37.1 C) (Oral)   Resp 17   Ht 5' (1.524 m)   Wt 68 kg   SpO2 97%   BMI 29.29 kg/m  Gen:   Awake, no distress   Resp:  Normal effort  MSK:   Moves extremities without difficulty  Other:  Chest pain reproduced with chest wall palpation.  Regular rate and rhythm on cardiac auscultation, lungs clear to auscultation bilaterally. Cranial nerves grossly intact  Medical Decision Making  Medically screening exam initiated at 8:23 PM.  Appropriate orders placed.  FAHIMA CIFELLI was informed that the remainder of the evaluation will be completed by another provider, this initial triage assessment does not replace that evaluation, and the importance of remaining in the ED until their evaluation is complete.     Arabella Merles, PA-C 07/03/23 2026

## 2023-07-04 DIAGNOSIS — R1084 Generalized abdominal pain: Secondary | ICD-10-CM | POA: Diagnosis not present

## 2023-07-04 DIAGNOSIS — R072 Precordial pain: Secondary | ICD-10-CM | POA: Diagnosis not present

## 2023-07-04 DIAGNOSIS — N915 Oligomenorrhea, unspecified: Secondary | ICD-10-CM | POA: Diagnosis not present

## 2023-07-04 DIAGNOSIS — R3129 Other microscopic hematuria: Secondary | ICD-10-CM | POA: Diagnosis not present

## 2023-07-04 DIAGNOSIS — F419 Anxiety disorder, unspecified: Secondary | ICD-10-CM | POA: Diagnosis not present

## 2023-07-04 DIAGNOSIS — Z6831 Body mass index (BMI) 31.0-31.9, adult: Secondary | ICD-10-CM | POA: Diagnosis not present

## 2023-07-08 DIAGNOSIS — F4312 Post-traumatic stress disorder, chronic: Secondary | ICD-10-CM | POA: Diagnosis not present

## 2023-07-08 DIAGNOSIS — F900 Attention-deficit hyperactivity disorder, predominantly inattentive type: Secondary | ICD-10-CM | POA: Diagnosis not present

## 2023-07-08 DIAGNOSIS — F411 Generalized anxiety disorder: Secondary | ICD-10-CM | POA: Diagnosis not present

## 2023-07-08 DIAGNOSIS — F330 Major depressive disorder, recurrent, mild: Secondary | ICD-10-CM | POA: Diagnosis not present

## 2023-07-26 DIAGNOSIS — Z6831 Body mass index (BMI) 31.0-31.9, adult: Secondary | ICD-10-CM | POA: Diagnosis not present

## 2023-07-26 DIAGNOSIS — E6609 Other obesity due to excess calories: Secondary | ICD-10-CM | POA: Diagnosis not present

## 2023-07-26 DIAGNOSIS — L308 Other specified dermatitis: Secondary | ICD-10-CM | POA: Diagnosis not present

## 2023-08-05 DIAGNOSIS — Z114 Encounter for screening for human immunodeficiency virus [HIV]: Secondary | ICD-10-CM | POA: Diagnosis not present

## 2023-08-05 DIAGNOSIS — Z1331 Encounter for screening for depression: Secondary | ICD-10-CM | POA: Diagnosis not present

## 2023-08-05 DIAGNOSIS — E559 Vitamin D deficiency, unspecified: Secondary | ICD-10-CM | POA: Diagnosis not present

## 2023-08-05 DIAGNOSIS — G4730 Sleep apnea, unspecified: Secondary | ICD-10-CM | POA: Diagnosis not present

## 2023-08-05 DIAGNOSIS — Z131 Encounter for screening for diabetes mellitus: Secondary | ICD-10-CM | POA: Diagnosis not present

## 2023-08-05 DIAGNOSIS — E6609 Other obesity due to excess calories: Secondary | ICD-10-CM | POA: Diagnosis not present

## 2023-08-05 DIAGNOSIS — K760 Fatty (change of) liver, not elsewhere classified: Secondary | ICD-10-CM | POA: Diagnosis not present

## 2023-08-05 DIAGNOSIS — R5383 Other fatigue: Secondary | ICD-10-CM | POA: Diagnosis not present

## 2023-08-05 DIAGNOSIS — Z1339 Encounter for screening examination for other mental health and behavioral disorders: Secondary | ICD-10-CM | POA: Diagnosis not present

## 2023-08-05 DIAGNOSIS — E789 Disorder of lipoprotein metabolism, unspecified: Secondary | ICD-10-CM | POA: Diagnosis not present

## 2023-08-05 DIAGNOSIS — R7989 Other specified abnormal findings of blood chemistry: Secondary | ICD-10-CM | POA: Diagnosis not present

## 2023-08-05 DIAGNOSIS — R0602 Shortness of breath: Secondary | ICD-10-CM | POA: Diagnosis not present

## 2023-08-05 DIAGNOSIS — Z0000 Encounter for general adult medical examination without abnormal findings: Secondary | ICD-10-CM | POA: Diagnosis not present

## 2023-08-05 DIAGNOSIS — Z6831 Body mass index (BMI) 31.0-31.9, adult: Secondary | ICD-10-CM | POA: Diagnosis not present

## 2023-08-12 ENCOUNTER — Encounter: Payer: Self-pay | Admitting: Obstetrics and Gynecology

## 2023-08-12 ENCOUNTER — Ambulatory Visit (INDEPENDENT_AMBULATORY_CARE_PROVIDER_SITE_OTHER): Payer: BC Managed Care – PPO | Admitting: Obstetrics and Gynecology

## 2023-08-12 VITALS — BP 123/80 | HR 84 | Ht 61.0 in | Wt 169.0 lb

## 2023-08-12 DIAGNOSIS — N911 Secondary amenorrhea: Secondary | ICD-10-CM

## 2023-08-12 LAB — POCT URINE PREGNANCY: Preg Test, Ur: NEGATIVE

## 2023-08-12 MED ORDER — MEDROXYPROGESTERONE ACETATE 10 MG PO TABS: 10.0000 mg | ORAL_TABLET | Freq: Every day | ORAL | 0 refills | Status: AC

## 2023-08-12 NOTE — Progress Notes (Signed)
   RETURN GYNECOLOGY VISIT  Subjective:  Paula Warner is a 37 y.o. [id number] s/p BTL with LMP 03/2023 (approximate) presenting for secondary amenorrhea  Has not had a period or any vaginal bleeding since July. Reports irregular periods since at least her tubal in 2018. Denies any current use of hormonal contraception.  Denies acne, hair growth. Reports hot flashes for the past two years. No vaginal dryness or dyspareunia.  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 Patient is in no acute distress.  Skin: Skin is warm and dry. No rash noted.   Cardiovascular: Normal heart rate noted  Respiratory: Normal respiratory effort, no problems with respiration noted  Abdomen: Soft, non-tender, non-distended   Pelvic: NEFG.   Exam performed in the presence of a chaperone  Assessment and Plan:  Paula Warner is a 37 y.o. with secondary amenorrhea  1. Secondary amenorrhea Workup outlined below. Plan for provera challenge. Pt instructed to send message if/when she starts bleeding - POCT urine pregnancy - TSH Rfx on Abnormal to Free T4 - Prolactin - Estradiol - FSH - Testosterone,Free and Total - US PELVIC COMPLETE WITH TRANSVAGINAL; Future -     medroxyPROGESTERone (PROVERA) 10 MG tablet; Take 1 tablet (10 mg total) by mouth daily. Use for ten days  Return for to be determined by test results.  Future Appointments  Date Time Provider Department Center  12/23/2023  3:30 PM Terri Piedra, DO [facility] None   Lennart Pall, MD

## 2023-08-13 DIAGNOSIS — E789 Disorder of lipoprotein metabolism, unspecified: Secondary | ICD-10-CM | POA: Diagnosis not present

## 2023-08-13 DIAGNOSIS — R0602 Shortness of breath: Secondary | ICD-10-CM | POA: Diagnosis not present

## 2023-08-13 DIAGNOSIS — N915 Oligomenorrhea, unspecified: Secondary | ICD-10-CM | POA: Diagnosis not present

## 2023-08-13 DIAGNOSIS — Z6831 Body mass index (BMI) 31.0-31.9, adult: Secondary | ICD-10-CM | POA: Diagnosis not present

## 2023-08-13 DIAGNOSIS — E6609 Other obesity due to excess calories: Secondary | ICD-10-CM | POA: Diagnosis not present

## 2023-08-13 DIAGNOSIS — G4730 Sleep apnea, unspecified: Secondary | ICD-10-CM | POA: Diagnosis not present

## 2023-08-13 DIAGNOSIS — R7303 Prediabetes: Secondary | ICD-10-CM | POA: Diagnosis not present

## 2023-08-13 DIAGNOSIS — R7989 Other specified abnormal findings of blood chemistry: Secondary | ICD-10-CM | POA: Diagnosis not present

## 2023-08-15 DIAGNOSIS — L851 Acquired keratosis [keratoderma] palmaris et plantaris: Secondary | ICD-10-CM | POA: Diagnosis not present

## 2023-08-15 DIAGNOSIS — M2042 Other hammer toe(s) (acquired), left foot: Secondary | ICD-10-CM | POA: Diagnosis not present

## 2023-08-15 DIAGNOSIS — M2062 Acquired deformities of toe(s), unspecified, left foot: Secondary | ICD-10-CM | POA: Diagnosis not present

## 2023-08-15 DIAGNOSIS — M79672 Pain in left foot: Secondary | ICD-10-CM | POA: Diagnosis not present

## 2023-08-15 LAB — FOLLICLE STIMULATING HORMONE: FSH: 6.3 m[IU]/mL

## 2023-08-15 LAB — PROLACTIN: Prolactin: 7.3 ng/mL (ref 4.8–33.4)

## 2023-08-15 LAB — TESTOSTERONE,FREE AND TOTAL
Testosterone, Free: 2.4 pg/mL (ref 0.0–4.2)
Testosterone: 38 ng/dL (ref 8–60)

## 2023-08-15 LAB — ESTRADIOL: Estradiol: 38.1 pg/mL

## 2023-08-15 LAB — TSH RFX ON ABNORMAL TO FREE T4: TSH: 1.45 u[IU]/mL (ref 0.450–4.500)

## 2023-08-26 ENCOUNTER — Emergency Department (HOSPITAL_COMMUNITY)
Admission: EM | Admit: 2023-08-26 | Discharge: 2023-08-26 | Discharge status: Against Medical Advice | Payer: Medicaid Other | Attending: Emergency Medicine | Admitting: Emergency Medicine

## 2023-08-26 DIAGNOSIS — Z5321 Procedure and treatment not carried out due to patient leaving prior to being seen by health care provider: Secondary | ICD-10-CM | POA: Insufficient documentation

## 2023-08-26 DIAGNOSIS — R079 Chest pain, unspecified: Secondary | ICD-10-CM | POA: Insufficient documentation

## 2023-09-06 DIAGNOSIS — E6609 Other obesity due to excess calories: Secondary | ICD-10-CM | POA: Diagnosis not present

## 2023-09-06 DIAGNOSIS — K760 Fatty (change of) liver, not elsewhere classified: Secondary | ICD-10-CM | POA: Diagnosis not present

## 2023-09-16 DIAGNOSIS — M2062 Acquired deformities of toe(s), unspecified, left foot: Secondary | ICD-10-CM | POA: Diagnosis not present

## 2023-09-16 DIAGNOSIS — M2042 Other hammer toe(s) (acquired), left foot: Secondary | ICD-10-CM | POA: Diagnosis not present

## 2023-10-01 ENCOUNTER — Emergency Department (HOSPITAL_COMMUNITY)
Admission: EM | Admit: 2023-10-01 | Discharge: 2023-10-01 | Discharge status: Against Medical Advice | Payer: Medicaid Other

## 2023-10-01 ENCOUNTER — Ambulatory Visit (HOSPITAL_COMMUNITY)
Admission: RE | Admit: 2023-10-01 | Discharge: 2023-10-01 | Disposition: A | Discharge status: Home / Self Care | Payer: Medicaid Other | Source: Ambulatory Visit | Attending: Obstetrics and Gynecology | Admitting: Obstetrics and Gynecology

## 2023-10-01 DIAGNOSIS — N911 Secondary amenorrhea: Secondary | ICD-10-CM | POA: Diagnosis present

## 2023-10-04 ENCOUNTER — Encounter: Payer: Self-pay | Admitting: Obstetrics and Gynecology

## 2023-12-23 ENCOUNTER — Ambulatory Visit: Payer: BC Managed Care – PPO | Admitting: Dermatology

## 2024-04-02 ENCOUNTER — Other Ambulatory Visit: Payer: Self-pay

## 2024-04-02 ENCOUNTER — Encounter (HOSPITAL_COMMUNITY): Payer: Self-pay | Admitting: Emergency Medicine

## 2024-04-02 ENCOUNTER — Emergency Department (HOSPITAL_COMMUNITY)
Admission: EM | Admit: 2024-04-02 | Discharge: 2024-04-02 | Disposition: A | Discharge status: Home / Self Care | Attending: Emergency Medicine | Admitting: Emergency Medicine

## 2024-04-02 DIAGNOSIS — G43009 Migraine without aura, not intractable, without status migrainosus: Secondary | ICD-10-CM | POA: Insufficient documentation

## 2024-04-02 DIAGNOSIS — J45909 Unspecified asthma, uncomplicated: Secondary | ICD-10-CM | POA: Insufficient documentation

## 2024-04-02 LAB — CBC WITH DIFFERENTIAL/PLATELET
Abs Immature Granulocytes: 0.02 K/uL (ref 0.00–0.07)
Basophils Absolute: 0.1 K/uL (ref 0.0–0.1)
Basophils Relative: 1 %
Eosinophils Absolute: 0.2 K/uL (ref 0.0–0.5)
Eosinophils Relative: 2 %
HCT: 39.9 % (ref 36.0–46.0)
Hemoglobin: 13.3 g/dL (ref 12.0–15.0)
Immature Granulocytes: 0 %
Lymphocytes Relative: 32 %
Lymphs Abs: 3 K/uL (ref 0.7–4.0)
MCH: 31.4 pg (ref 26.0–34.0)
MCHC: 33.3 g/dL (ref 30.0–36.0)
MCV: 94.1 fL (ref 80.0–100.0)
Monocytes Absolute: 0.9 K/uL (ref 0.1–1.0)
Monocytes Relative: 9 %
Neutro Abs: 5.2 K/uL (ref 1.7–7.7)
Neutrophils Relative %: 56 %
Platelets: 246 K/uL (ref 150–400)
RBC: 4.24 MIL/uL (ref 3.87–5.11)
RDW: 12.7 % (ref 11.5–15.5)
WBC: 9.4 K/uL (ref 4.0–10.5)
nRBC: 0 % (ref 0.0–0.2)

## 2024-04-02 LAB — BASIC METABOLIC PANEL WITH GFR
Anion gap: 8 (ref 5–15)
BUN: 13 mg/dL (ref 6–20)
CO2: 28 mmol/L (ref 22–32)
Calcium: 9.4 mg/dL (ref 8.9–10.3)
Chloride: 106 mmol/L (ref 98–111)
Creatinine, Ser: 0.78 mg/dL (ref 0.44–1.00)
GFR, Estimated: >60 mL/min (ref >60)
Glucose, Bld: 95 mg/dL (ref 70–99)
Potassium: 3.9 mmol/L (ref 3.5–5.1)
Sodium: 142 mmol/L (ref 135–145)

## 2024-04-02 LAB — HCG, SERUM, QUALITATIVE: Preg, Serum: NEGATIVE

## 2024-04-02 MED ORDER — LACTATED RINGERS IV BOLUS
1000.0000 mL | Freq: Once | INTRAVENOUS | Status: AC
  Administered 2024-04-02: 1000 mL via INTRAVENOUS

## 2024-04-02 MED ORDER — METOCLOPRAMIDE HCL 10 MG PO TABS: 10.0000 mg | ORAL_TABLET | Freq: Four times a day (QID) | ORAL | 0 refills | Status: AC

## 2024-04-02 MED ORDER — DEXAMETHASONE SODIUM PHOSPHATE 10 MG/ML IJ SOLN
10.0000 mg | Freq: Once | INTRAMUSCULAR | Status: AC
  Administered 2024-04-02: 10 mg via INTRAVENOUS
  Filled 2024-04-02: qty 1

## 2024-04-02 MED ORDER — METOCLOPRAMIDE HCL 5 MG/ML IJ SOLN
10.0000 mg | Freq: Once | INTRAMUSCULAR | Status: AC
  Administered 2024-04-02: 10 mg via INTRAVENOUS
  Filled 2024-04-02: qty 2

## 2024-04-02 MED ORDER — KETOROLAC TROMETHAMINE 15 MG/ML IJ SOLN
15.0000 mg | Freq: Once | INTRAMUSCULAR | Status: AC
  Administered 2024-04-02: 15 mg via INTRAVENOUS
  Filled 2024-04-02: qty 1

## 2024-04-02 NOTE — ED Triage Notes (Signed)
 Patient c/o headache since Tuesday, nausea since yesterday, and right side neck pain that started today. Patient denies history of migraines. Patient gives verbal consent for MSE.

## 2024-04-02 NOTE — ED Provider Notes (Signed)
 [place] EMERGENCY DEPARTMENT AT [facility] Provider Note   CSN: [id number] Arrival date & time: 04/02/24  2012     Patient presents with: Headache   Paula Warner is a 38 y.o. female.   Headache Associated symptoms: neck pain   Patient is a 38 year old female to the ED today with concerns for right sided, pulsatile headache does not present for last 2 days intermittently accompanied with photophobia and phonophobia.  States that she has also had some mild nausea without emesis.  Came today however due to having some right-sided neck pain, worse when looking to the right and was concerned after looking up symptoms online. 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     Prior to Admission medications   Medication Sig Start Date End Date Taking? Authorizing Provider  metoCLOPramide  (REGLAN ) 10 MG tablet Take 1 tablet (10 mg total) by mouth every 6 (six) hours. 04/02/24  Yes Beola Terrall RAMAN, PA-C  cholecalciferol (VITAMIN D3) 25 MCG (1000 UNIT) tablet Take 1,000 Units by mouth daily.    [provider]  medroxyPROGESTERone  (PROVERA ) 10 MG tablet Take 1 tablet (10 mg total) by mouth daily. Use for ten days 08/12/23   Erik Kieth BROCKS, MD    Allergies: Zinacef  Innocentia.Hicks ]    Review of Systems  Musculoskeletal:  Positive for neck pain.  Neurological:  Positive for headaches.  All other systems reviewed and are negative.   Updated Vital Signs BP (!) 126/95 (BP Location: Left Arm)   Pulse 72   Temp 98.5 F (36.9 C)   Resp 15   Ht 5' 1 (1.549 m)   Wt 81.6 kg   SpO2 98%   BMI 34.01 kg/m   Physical Exam Vitals and nursing note reviewed.  Constitutional:      General: She is  not in acute distress.    Appearance: Normal appearance. She is not ill-appearing or diaphoretic.  HENT:     Head: Normocephalic and atraumatic.     Mouth/Throat:     Mouth: Mucous membranes are moist.     Pharynx: Oropharynx is clear. No oropharyngeal exudate or posterior oropharyngeal erythema.  Eyes:     General: No visual field deficit or scleral icterus.       Right eye: No discharge.        Left eye: No discharge.     Extraocular Movements: Extraocular movements intact.     Right eye: Normal extraocular motion and no nystagmus.     Left eye: Normal extraocular motion and no nystagmus.     Conjunctiva/sclera: Conjunctivae normal.     Pupils: Pupils are equal, round, and reactive to light. Pupils are equal.     Right eye: Pupil is round and reactive.     Left eye: Pupil is round and reactive.  Neck:     Vascular: No carotid bruit.     Meningeal: Brudzinski's sign and Kernig's sign absent.  Cardiovascular:     Rate and Rhythm: Normal rate and regular rhythm.     Pulses: Normal pulses.     Heart sounds: Normal heart sounds. No murmur heard.    No friction rub. No gallop.  Pulmonary:     Effort: Pulmonary effort is normal. No respiratory distress.     Breath sounds: Normal breath sounds. No  stridor. No wheezing, rhonchi or rales.  Chest:     Chest wall: No tenderness.  Abdominal:     General: Abdomen is flat. There is no distension.     Palpations: Abdomen is soft.     Tenderness: There is no abdominal tenderness. There is no right CVA tenderness, left CVA tenderness or guarding.  Musculoskeletal:        General: No swelling.     Cervical back: Normal range of motion and neck supple. No rigidity.     Right lower leg: No edema.     Left lower leg: No edema.  Lymphadenopathy:     Cervical: No cervical adenopathy.  Skin:    General: Skin is warm and dry.     Coloration: Skin is not cyanotic or pale.     Findings: No erythema.  Neurological:     General: No focal deficit  present.     Mental Status: She is alert and oriented to person, place, and time. Mental status is at baseline.     Cranial Nerves: No cranial nerve deficit, dysarthria or facial asymmetry.     Sensory: No sensory deficit.     Motor: No weakness.     Coordination: Coordination normal.     Gait: Gait normal.  Psychiatric:        Mood and Affect: Mood normal. Mood is not anxious.        Behavior: Behavior is not agitated.     (all labs ordered are listed, but only abnormal results are displayed) Labs Reviewed  HCG, SERUM, QUALITATIVE  BASIC METABOLIC PANEL WITH GFR  CBC WITH DIFFERENTIAL/PLATELET    EKG: None  Radiology: No results found.  Procedures   Medications Ordered in the ED  ketorolac  (TORADOL ) 15 MG/ML injection 15 mg (has no administration in time range)  lactated ringers  bolus 1,000 mL (1,000 mLs Intravenous New Bag/Given 04/02/24 2105)  metoCLOPramide  (REGLAN ) injection 10 mg (10 mg Intravenous Given 04/02/24 2107)  dexamethasone  (DECADRON ) injection 10 mg (10 mg Intravenous Given 04/02/24 2107)                               Medical Decision Making Amount and/or Complexity of Data Reviewed Labs: ordered.   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  Denies fever, vomiting, chest pain, shortness of breath, one-sided weakness, lower leg edema.  On physical exam, patient is in no acute distress, afebrile, alert and orient x 4, speaking in full sentences, nontachypneic, nontachycardic.  Patient had normal neuroexam and no signs of lower leg edema, no signs of meningitis.  LCTAB, RRR, no murmur, no abdominal tenderness to palpation.  Patient had full range  of motion at the neck, with no neck stiffness.  No cervical lymphadenopathy was noted.  Oropharynx was clear with no sign of erythema or exudate.  Unremarkable exam otherwise.  With patient's current presentation, suspecting migraine headache as cause of patient's symptoms.  Will obtain baseline labs.  Labs were unremarkable.  Provided Decadron , Reglan  as well as LR.  On reevaluation patient states that the symptoms have significantly improved.  Provided Toradol  for additional relief.  On reevaluation, patient  notes that her symptoms have abated.  Will have her continue to take over-the-counter Tylenol  and Advil  as needed.  Will have her establish care with primary care and follow-up for any persistent symptoms and return to the ED for any new or worsening symptoms.  Will also provide Reglan  to use for any persistent nausea and/or headache.  Patient vital signs have remained stable throughout the course of patient's time in the ED. Low suspicion for any other emergent pathology at this time. I believe this patient is safe to be discharged. Provided strict return to ER precautions. Patient expressed agreement and understanding of plan. All questions were answered.    Differential diagnoses prior to evaluation: The emergent differential diagnosis includes, but is not limited to, tension headache, migraine, polypharmacy, substance abuse, sinusitis, cervicogenic headache, dehydration, cluster headache, trigeminal neuralgia, IIH, PRES syndrome, intracranial bleed, CVA, meningitis. This is not an exhaustive differential.   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 Pertinent results include: Does not have any history of migraines  Lab Tests/Imaging studies: I personally interpreted labs/imaging and the pertinent results include:   CBC unremarkable BMP unremarkable hCG qualitative  negative.  Considered CT imaging modality is warranted at this time.  Medications: I ordered medication including Toradol , Reglan , Decadron , LR.  I have reviewed the patients home medicines and have made adjustments as needed.  Critical Interventions: None  Social Determinants of Health:  Disposition: After consideration of the diagnostic results and the patients response to treatment, I feel that the patient would benefit from discharge and treatment as above.   emergency department workup does not suggest an emergent condition requiring admission or immediate intervention beyond what has been performed at this time. The plan is: Follow-up with PCP, return to ED for any new or worsening symptoms. The patient is safe for discharge and has been instructed to return immediately for worsening symptoms, change in symptoms or any other concerns.    Final diagnoses:  Migraine without aura and without status migrainosus, not intractable    ED Discharge Orders          Ordered    metoCLOPramide  (REGLAN ) 10 MG tablet  Every 6 hours        04/02/24 2258               Beola Terrall RAMAN, PA-C 04/02/24 2259    Franklyn Sid SAILOR, MD 04/03/24 [id number]

## 2024-04-02 NOTE — Discharge Instructions (Addendum)
 You were seen today for migraine headache.  Your lab work and physical dam today were reassuring that have low suspicion for any emergent causes of your symptoms today.  However recommend you follow-up with PCP for reevaluation to ensure that do not need any long-term medication for headache relief.  Recommend continue take Tylenol  and ibuprofen  as needed for pain relief.  Take Tylenol  (acetominophen)  650mg  every 4-6 hours, as needed for pain or fever. Do not take more than 4,000 mg in a 24-hour period. As this may cause liver damage. While this is rare, if you begin to develop yellowing of the skin or eyes, stop taking and return to ER immediately.  Take Ibuprofen  400mg  every 4-6 hours for pain or fever, not exceeding 3,200 mg per day as more than 3,200mg  can cause Stomach irritation, dizziness, kidney issues with long-term use.  I have also prescribed Reglan  for you to use for nausea and/or recurrent headaches.  Take this as needed.  However if you begin to have any new or worsening symptoms which would include persistent vomiting, fever, persistent vision changes, chest pain, shortness of breath, one-sided weakness, seizures, confusion, please return to the ED as further evaluation will be needed at that time.

## 2024-04-08 ENCOUNTER — Other Ambulatory Visit: Payer: Self-pay

## 2024-04-08 ENCOUNTER — Encounter (HOSPITAL_COMMUNITY): Payer: Self-pay | Admitting: Emergency Medicine

## 2024-04-08 ENCOUNTER — Emergency Department (HOSPITAL_COMMUNITY): Admission: EM | Admit: 2024-04-08 | Discharge: 2024-04-08 | Discharge status: Against Medical Advice

## 2024-04-08 DIAGNOSIS — Z5329 Procedure and treatment not carried out because of patient's decision for other reasons: Secondary | ICD-10-CM | POA: Diagnosis not present

## 2024-04-08 DIAGNOSIS — R519 Headache, unspecified: Secondary | ICD-10-CM | POA: Diagnosis present

## 2024-04-08 LAB — I-STAT CHEM 8, ED
BUN: 11 mg/dL (ref 6–20)
Calcium, Ion: 1.18 mmol/L (ref 1.15–1.40)
Chloride: 106 mmol/L (ref 98–111)
Creatinine, Ser: 0.7 mg/dL (ref 0.44–1.00)
Glucose, Bld: 84 mg/dL (ref 70–99)
HCT: 42 % (ref 36.0–46.0)
Hemoglobin: 14.3 g/dL (ref 12.0–15.0)
Potassium: 3.9 mmol/L (ref 3.5–5.1)
Sodium: 143 mmol/L (ref 135–145)
TCO2: 27 mmol/L (ref 22–32)

## 2024-04-08 LAB — HCG, SERUM, QUALITATIVE: Preg, Serum: NEGATIVE

## 2024-04-08 MED ORDER — PROCHLORPERAZINE EDISYLATE 10 MG/2ML IJ SOLN
10.0000 mg | Freq: Once | INTRAMUSCULAR | Status: AC
  Administered 2024-04-08: 10 mg via INTRAVENOUS
  Filled 2024-04-08: qty 2

## 2024-04-08 MED ORDER — LACTATED RINGERS IV BOLUS
1000.0000 mL | Freq: Once | INTRAVENOUS | Status: AC
  Administered 2024-04-08: 1000 mL via INTRAVENOUS

## 2024-04-08 MED ORDER — ACETAMINOPHEN 500 MG PO TABS
1000.0000 mg | ORAL_TABLET | Freq: Once | ORAL | Status: AC
  Administered 2024-04-08: 1000 mg via ORAL
  Filled 2024-04-08: qty 2

## 2024-04-08 NOTE — ED Triage Notes (Signed)
 Pt comes in for headache states she is still having pressure in her head along with nausea. States headache is not feeling any better it has been a week. She states she didn't receive any scans.

## 2024-04-08 NOTE — ED Notes (Signed)
 Pt reported she was going to sign herself out and just follow up with a neurologist.  Pt stated her pain had improved since medication.  Requested I take out her IV and she ambulated from the department in NAD

## 2024-04-08 NOTE — ED Provider Notes (Signed)
 [place] EMERGENCY DEPARTMENT AT [facility] Provider Note   CSN: [id number] Arrival date & time: 04/08/24  1223     Patient presents with: Headache   Paula Warner is a 38 y.o. female.   38 year old female presenting emergency department for headache.  Headache for close to a week.  No history of headaches.  Was seen in the emergency department several days ago, negative blood work.  She notes that headache symptoms have persisted.  No vision loss, facial droop unilateral weakness, no neck stiffness, rash fevers.   Headache      Prior to Admission medications   Medication Sig Start Date End Date Taking? Authorizing Provider  cholecalciferol (VITAMIN D3) 25 MCG (1000 UNIT) tablet Take 1,000 Units by mouth daily.    [provider]  medroxyPROGESTERone  (PROVERA ) 10 MG tablet Take 1 tablet (10 mg total) by mouth daily. Use for ten days 08/12/23   Erik Kieth BROCKS, MD  metoCLOPramide  (REGLAN ) 10 MG tablet Take 1 tablet (10 mg total) by mouth every 6 (six) hours. 04/02/24   Beola Terrall RAMAN, PA-C    Allergies: Zinacef  [cefuroxime ]    Review of Systems  Neurological:  Positive for headaches.    Updated Vital Signs BP (!) 138/96   Pulse 74   Temp 98.1 F (36.7 C)   Resp 16   Ht 5' 1 (1.549 m)   Wt 74.8 kg   SpO2 100%   BMI 31.18 kg/m   Physical Exam Vitals and nursing note reviewed.  Constitutional:      General: She is not in acute distress. Eyes:     General: No visual field deficit. Cardiovascular:     Rate and Rhythm: Normal rate.  Pulmonary:     Effort: Pulmonary effort is normal.     Breath sounds: Normal breath sounds.  Abdominal:     Palpations: Abdomen is soft.  Musculoskeletal:     Cervical back: Normal range of motion and neck supple.  Skin:    General: Skin is warm and dry.  Neurological:     Mental Status: She is alert and oriented to person, place, and time.     GCS: GCS eye subscore is 4. GCS verbal subscore is 5.  GCS motor subscore is 6.     Cranial Nerves: No cranial nerve deficit, dysarthria or facial asymmetry.     Sensory: No sensory deficit.     Motor: No weakness.     Coordination: Coordination normal.     Gait: Gait normal.  Psychiatric:        Mood and Affect: Mood normal.        Behavior: Behavior normal.     (all labs ordered are listed, but only abnormal results are displayed) Labs Reviewed  RESP PANEL BY RT-PCR (RSV, FLU A&B, COVID)  [id number]  HCG, SERUM, QUALITATIVE  I-STAT CHEM 8, ED    EKG: None  Radiology: No results found.   Procedures   Medications Ordered in the ED  acetaminophen  (TYLENOL ) tablet 1,000 mg (1,000 mg Oral Given 04/08/24 1631)  prochlorperazine  (COMPAZINE ) injection 10 mg (10 mg Intravenous Given 04/08/24 1631)  lactated ringers  bolus 1,000 mL (0 mLs Intravenous Stopped 04/08/24 1709)                                    Medical Decision Making Is a well-appearing 38 year old female presenting emergency department for headache.  She  is afebrile nontachycardic, normotensive.  Clinically well-appearing without localizing neurodeficits.  No signs of meningeal irritation.  I did order imaging to evaluate for intracranial pathology as this is her second interaction with healthcare system for these new onset headaches.  Migraine cocktail ordered as well as basic labs.  Informed by nurse that patient eloped/left AMA after receiving medications.  She does have neurology follow-up on the 30th of this month.  Amount and/or Complexity of Data Reviewed Labs: ordered. Radiology: ordered.  Risk OTC drugs. Prescription drug management.      Final diagnoses:  None    ED Discharge Orders     None          Neysa Caron PARAS, DO 04/08/24 1710

## 2024-07-17 ENCOUNTER — Emergency Department (HOSPITAL_COMMUNITY)
Admission: EM | Admit: 2024-07-17 | Discharge: 2024-07-17 | Disposition: A | Discharge status: Home / Self Care | Attending: Emergency Medicine | Admitting: Emergency Medicine

## 2024-07-17 ENCOUNTER — Other Ambulatory Visit: Payer: Self-pay

## 2024-07-17 DIAGNOSIS — J029 Acute pharyngitis, unspecified: Secondary | ICD-10-CM | POA: Diagnosis present

## 2024-07-17 LAB — GROUP A STREP BY PCR: Group A Strep by PCR: NOT DETECTED

## 2024-07-17 NOTE — ED Provider Notes (Signed)
 [facility] AT [facility] Provider Note   CSN: [id number] Arrival date & time: 07/17/24  9092     History Chief Complaint  Patient presents with   Sore Throat    HPI: Paula Warner is a 38 y.o. female with no pertinent history who presents complaining of sore throat, cough, tongue burning. Patient arrived via POV.  History provided by patient.  No interpreter required during this encounter.  Patient reports that she has had 1 day of sore throat, tolerating p.o., no difficulty breathing, has also had 1 day of nonproductive cough.  Denies fever, chills, chest pain, shortness of breath, nausea, vomiting, diarrhea, abdominal pain, dysuria, vaginal discharge.  Does report that she has had approximately 3 weeks of abnormal sensation of her tongue which she describes as burning.  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  Denies any pain, lesions, itching to hands, feet, buttocks.  Patient's recorded medical, surgical, social, medication list and allergies were reviewed in the Snapshot window as part of the initial history.   Prior to Admission medications   Medication Sig Start Date End Date Taking? Authorizing Provider  cholecalciferol (VITAMIN D3) 25 MCG (1000 UNIT) tablet Take 1,000 Units by mouth daily.    [provider]  medroxyPROGESTERone  (PROVERA ) 10 MG tablet Take 1 tablet (10 mg total) by mouth daily. Use for ten days 08/12/23   Erik Kieth BROCKS, MD  metoCLOPramide  (REGLAN ) 10 MG tablet Take 1 tablet (10 mg total) by mouth every 6 (six) hours. 04/02/24   Bauer, Collin S, PA-C     Allergies: Zinacef  [cefuroxime ]    Review of Systems   ROS as per HPI  Physical Exam Updated Vital Signs BP 133/82   Pulse 74   Temp 97.9 F (36.6 C) (Oral)   Resp 18   SpO2 100%  Physical Exam Vitals and nursing note reviewed.  Constitutional:      General: She is not in acute distress.    Appearance: She is well-developed.  HENT:     Head: Normocephalic and atraumatic.     Right Ear: Ear canal and external ear normal. There is impacted cerumen.     Left Ear: Ear canal and external ear normal. There is impacted cerumen.     Mouth/Throat:     Tongue: No lesions. Tongue does not deviate from midline.     Palate: No mass and lesions.     Pharynx: Oropharynx is clear. Uvula midline. No pharyngeal swelling, oropharyngeal exudate, posterior oropharyngeal erythema or uvula swelling.     Tonsils: No tonsillar exudate or tonsillar abscesses. 1+ on the right. 1+ on the left.  Eyes:     Conjunctiva/sclera: Conjunctivae normal.  Cardiovascular:     Rate and Rhythm: Normal rate and regular rhythm.     Heart sounds: No murmur heard. Pulmonary:     Effort: Pulmonary effort is normal. No respiratory distress.     Breath sounds: Normal breath sounds.  Abdominal:     Palpations: Abdomen is soft.     Tenderness: There is no abdominal tenderness.  Musculoskeletal:        General: No swelling.     Cervical  back: Neck supple.  Lymphadenopathy:     Cervical: No cervical adenopathy.  Skin:    General: Skin is warm and dry.     Capillary Refill: Capillary refill takes less than 2 seconds.  Neurological:     Mental Status: She is alert.  Psychiatric:        Mood and Affect: Mood normal.     ED Course/ Medical Decision Making/ A&P    Procedures Procedures   Medications Ordered in ED Medications - No data to display  Medical Decision Making:   Paula Warner is a 38 y.o. female who presents for sore throat and cough as per above.  Physical exam is pertinent for no focal abnormalities.   The differential  includes but is not limited to viral pharyngitis, strep pharyngitis, PTA, RPA, meningitis, otitis media, otitis externa, mastoiditis, sepsis.  Independent historian: None  External data reviewed: No pertinent external data  Labs: Ordered, Independent interpretation, and Details: Strep screen negative  Radiology: Not indicated No results found.  EKG/Medicine tests: Not indicated EKG Interpretation:    Interventions: None  See the EMR for full details regarding lab and imaging results.  Patient presents for sore throat.  Has no oropharyngeal erythema, exudates, asymmetry on exam, no neck pain or stiffness, no meningismus, thus doubt PTA, RPA, meningitis.  No mastoid erythema or tenderness, no pain with manipulation of the external ear, no ear pain.  Doubt mastoiditis, otitis externa.  Lack of ear pain also makes AOM less likely, though patient notably does have bilateral cerumen impactions, discussed use of Debrox as needed.  Strep screen obtained in triage, negative.  Given patient's concurrent nonproductive cough without other sick stomach symptoms, lungs CTAB, most likely viral infection.  Given patient without productive cough, no abnormal breath sounds, saturating well on room air, do not feel that patient requires chest x-ray at this time.  No definitive evidence of hand-foot-and-mouth disease on exam, though discussed with patient that this is caused by a virus, thus if she develops in the characteristic areas (hands, feet, mouth, buttocks), that would likely be indicative of hand-foot-and-mouth disease.  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  On exam patient without lacerations, stridor, airway obstruction, therefore discussed continuing to follow-up this symptoms with her PCP, patient comfortable with this plan.  Discharged  with plan for supportive care for likely viral illness.  Presentation is most consistent with acute uncomplicated illness with systemic symptoms  Discussion of management or test interpretations with external provider(s): Not indicated  Risk Drugs:None  Disposition: DISCHARGE: I believe that the patient is safe for discharge home with outpatient follow-up. Patient was informed of all pertinent physical exam, laboratory, and imaging findings. Patient's suspected etiology of their symptom presentation was discussed with the patient and all questions were answered. We discussed following up with PCP. I provided thorough ED return precautions. The patient feels safe and comfortable with this plan.  MDM generated using voice dictation software and may contain dictation errors.  Please contact me for any clarification or with any questions.  Clinical Impression:  1. Sore throat      Discharge   Final Clinical Impression(s) / ED Diagnoses Final diagnoses:  Sore throat    Rx / DC Orders ED Discharge Orders     None        Rogelia Jerilynn RAMAN, MD 07/19/24 1339

## 2024-07-17 NOTE — Discharge Instructions (Addendum)
 Paula Warner  Thank you for allowing us  to take care of you today.  You came to the Emergency Department today because he had a sore throat and cough, and you have had some burning sensation of your tongue.  You do not have any abnormalities of your tongue on exam such as bleeding, swelling, difficulty breathing.  You should continue to follow-up with your PCP for this complaint.  Your throat does not show any severe enlargement or asymmetry of your tonsils, you do not have any symptoms consistent with a pocket of infection in your tonsils.  You were negative for strep here in the emergency department, you most likely have a virus causing your symptoms.  We recommend rest, fluids, Tylenol  and ibuprofen  as needed for fever and pain, and follow-up with your primary care doctor.  To-Do: 1. Please follow-up with your primary doctor within 1 - 2 weeks / as soon as possible.   Please return to the Emergency Department or call 911 if you experience have worsening of your symptoms, or do not get better, chest pain, shortness of breath, severe or significantly worsening pain, high fever, severe confusion, pass out or have any reason to think that you need emergency medical care.   We hope you feel better soon.   Mitzie Later, MD Department of Emergency Medicine [facility] [place]

## 2024-07-17 NOTE — ED Triage Notes (Signed)
 Pt reports sore throat that started today. Pt also reporting that her tongue has been burning for 3 weeks.

## 2024-07-21 ENCOUNTER — Ambulatory Visit: Admitting: Dermatology

## 2024-09-08 ENCOUNTER — Ambulatory Visit (INDEPENDENT_AMBULATORY_CARE_PROVIDER_SITE_OTHER): Admitting: Obstetrics

## 2024-09-08 ENCOUNTER — Encounter: Payer: Self-pay | Admitting: Obstetrics

## 2024-09-08 ENCOUNTER — Other Ambulatory Visit (HOSPITAL_COMMUNITY)
Admission: RE | Admit: 2024-09-08 | Discharge: 2024-09-08 | Disposition: A | Source: Ambulatory Visit | Attending: Obstetrics | Admitting: Obstetrics

## 2024-09-08 VITALS — BP 114/73 | HR 76 | Ht 61.0 in | Wt 166.2 lb

## 2024-09-08 DIAGNOSIS — N926 Irregular menstruation, unspecified: Secondary | ICD-10-CM

## 2024-09-08 DIAGNOSIS — Z01411 Encounter for gynecological examination (general) (routine) with abnormal findings: Secondary | ICD-10-CM

## 2024-09-08 DIAGNOSIS — N898 Other specified noninflammatory disorders of vagina: Secondary | ICD-10-CM

## 2024-09-08 DIAGNOSIS — E669 Obesity, unspecified: Secondary | ICD-10-CM

## 2024-09-08 DIAGNOSIS — Z01419 Encounter for gynecological examination (general) (routine) without abnormal findings: Secondary | ICD-10-CM | POA: Insufficient documentation

## 2024-09-08 DIAGNOSIS — R350 Frequency of micturition: Secondary | ICD-10-CM | POA: Diagnosis not present

## 2024-09-08 LAB — POCT URINALYSIS DIPSTICK
Bilirubin, UA: NEGATIVE
Blood, UA: NEGATIVE
Glucose, UA: NEGATIVE
Ketones, UA: NEGATIVE
Leukocytes, UA: NEGATIVE
Nitrite, UA: NEGATIVE
Protein, UA: NEGATIVE
Spec Grav, UA: 1.015 (ref 1.010–1.025)
Urobilinogen, UA: 0.2 U/dL
pH, UA: 6.5 (ref 5.0–8.0)

## 2024-09-08 NOTE — Progress Notes (Signed)
 Pt has not had a period since September.  Pt had tubal ligation 2018.  Pt here for Annual. Last PAP 10/17/2023.

## 2024-09-08 NOTE — Progress Notes (Signed)
 Subjective:        Paula Warner is a 38 y.o. female here for a routine exam.  Current complaints: Complains of urinary frequency.  No period since September.  Has history of irregular periods since tubal ligation in 2018.  Hormonal work-up ( FSH/LH, TSH, Prolactin ) and ultrasound are normal..  PMH significant for a DVT, kidney stones and gallstone pancreatitis.  Personal health questionnaire:  Is patient [name], have a family history of breast and/or ovarian cancer: no Is there a family history of uterine cancer diagnosed at age < 2, gastrointestinal cancer, urinary tract cancer, family member who is a Personnel Officer syndrome-associated carrier: no Is the patient overweight and hypertensive, family history of diabetes, personal history of gestational diabetes, preeclampsia or PCOS: no Is patient over 22, have PCOS,  family history of premature CHD under age 44, diabetes, smoke, have hypertension or peripheral artery disease:  no At any time, has a partner hit, kicked or otherwise hurt or frightened you?: no Over the past 2 weeks, have you felt down, depressed or hopeless?: no Over the past 2 weeks, have you felt little interest or pleasure in doing things?:no   Gynecologic History Patient's last menstrual period was 05/25/2024 (approximate). Contraception: tubal ligation Last Pap: 10/16/2022. Results were: normal Last mammogram: N/A. Results were: N/A  Obstetric History OB History  Gravida Para Term Preterm AB Living  3 1   1 2   SAB IAB Ectopic Multiple Live Births   1  0 2    # Outcome Date GA Lbr Len/2nd Weight Sex Type Anes PTL Lv  3 IAB 04/29/15          2 Gravida 04/15/10 [redacted]w[redacted]d  6 lb 6 oz (2.892 kg) M CS-LTranv Spinal N LIV  1 Para 12/01/04 [redacted]w[redacted]d  6 lb 7 oz (2.92 kg) M CS-LTranv Spinal N LIV     Complications: Dysfunctional Labor    Past Medical History:  Diagnosis Date   Asthma    History of asthma    no current med.   History of DVT (deep vein thrombosis)  06/2016   on anticoagulants for a period of time, but no longer on them   History of kidney stones    May-Thurner syndrome    Menstrual periods irregular     Past Surgical History:  Procedure Laterality Date   CESAREAN SECTION     x 2   CHOLECYSTECTOMY N/A 02/05/2020   Procedure: LAPAROSCOPIC CHOLECYSTECTOMY WITH POSSIBLE INTRAOPERATIVE CHOLANGIOGRAM;  Surgeon: Sheldon Standing, MD;  Location: [facility];  Service: General;  Laterality: N/A;   CYSTOSCOPY WITH RETROGRADE PYELOGRAM, URETEROSCOPY AND STENT PLACEMENT Bilateral 04/11/2022   Procedure: CYSTOSCOPY WITH RETROGRADE PYELOGRAM, URETEROSCOPY BASKETING OF STONE AND STENT PLACEMENT;  Surgeon: Alvaro Hummer, MD;  Location: [facility];  Service: Urology;  Laterality: Bilateral;  1 HR   LAPAROSCOPIC TUBAL LIGATION Bilateral 05/08/2017   Procedure: LAPAROSCOPIC TUBAL LIGATION - FILSHIE CLIPS;  Surgeon: Starla Harland BROCKS, MD;  Location: [facility];  Service: Gynecology;  Laterality: Bilateral;   LOWER EXTREMITY ANGIOGRAM Left 07/11/2016   Procedure: Percutaneous Venous Thrombectomy with IVUS and  with Stent Placement;  Surgeon: Gaile LELON New, MD;  Location: [facility];  Service: Vascular;  Laterality: Left;   STENT PLACEMENT VASCULAR ([facility] HX) Left 2017   L leg due to DVT   TUBAL LIGATION Bilateral     Current Medications[1] Allergies[2]  Social History   Tobacco Use   Smoking status: Never   Smokeless tobacco:  Never  Substance Use Topics   Alcohol  use: No    Alcohol /week: 0.0 standard drinks of alcohol     Family History  Problem Relation Age of Onset   Cancer Father    Clotting disorder Father    Heart disease Father    Heart murmur Mother    Asthma Mother       Review of Systems  Constitutional: negative for fatigue and weight loss Respiratory: negative for cough and wheezing Cardiovascular: negative for chest pain, fatigue and palpitations Gastrointestinal: negative for abdominal pain and change in bowel  habits Musculoskeletal:negative for myalgias Neurological: negative for gait problems and tremors Behavioral/Psych: negative for abusive relationship, depression Endocrine: negative for temperature intolerance    Genitourinary: positive for vaginal discharge and abnormal menstrual periods.  Negative for genital lesions, hot flashes, sexual problems  Integument/breast: negative for breast lump, breast tenderness, nipple discharge and skin lesion(s)    Objective:       BP 114/73   Pulse 76   Ht 5' 1 (1.549 m)   Wt 166 lb 3.2 oz (75.4 kg)   LMP 05/25/2024 (Approximate)   BMI 31.40 kg/m  General:   Alert and no distress  Skin:   no rash or abnormalities  Lungs:   clear to auscultation bilaterally  Heart:   regular rate and rhythm, S1, S2 normal, no murmur, click, rub or gallop  The remainder of the physical exam deferred due to the type of encounter.  Lab Review Urine pregnancy test Labs reviewed yes Radiologic studies reviewed no  I have spent a total of 20 minutes of face-to-face time, excluding clinical staff time, reviewing notes and preparing to see patient, ordering tests and/or medications, and counseling the patient.   Assessment:   1. Encounter for gynecological examination with Papanicolaou smear of cervix (Primary) Rx: - Cytology - PAP( [place])  2. Irregular periods/menstrual cycles  3. Vaginal discharge Rx: - Cervicovaginal ancillary only( [place]) - Hepatitis B Surface AntiGEN - Hepatitis C Antibody - HIV antibody (with reflex) - RPR  4. Urinary frequency Rx: - POCT Urinalysis Dipstick - Urine Culture  5. Obesity (BMI 30-39.9)    Plan:    Education reviewed: calcium supplements, depression evaluation, low fat, low cholesterol diet, safe sex/STD prevention, self breast exams, and weight bearing exercise. Follow up in: 6 months.    Orders Placed This Encounter  Procedures   Urine Culture   Hepatitis B Surface AntiGEN   Hepatitis C  Antibody   HIV antibody (with reflex)   RPR   POCT Urinalysis Dipstick     CARLIN RONAL CENTERS, MD, FACOG Attending Obstetrician & Gynecologist, [facility] for [facility], [facility], [facility] 09/08/2024      [1]  Current Outpatient Medications:    cholecalciferol (VITAMIN D3) 25 MCG (1000 UNIT) tablet, Take 1,000 Units by mouth daily., Disp: , Rfl:    medroxyPROGESTERone  (PROVERA ) 10 MG tablet, Take 1 tablet (10 mg total) by mouth daily. Use for ten days (Patient not taking: Reported on 09/08/2024), Disp: 10 tablet, Rfl: 0   metoCLOPramide  (REGLAN ) 10 MG tablet, Take 1 tablet (10 mg total) by mouth every 6 (six) hours. (Patient not taking: Reported on 09/08/2024), Disp: 30 tablet, Rfl: 0 [2]  Allergies Allergen Reactions   Zinacef  [Cefuroxime ] Hives

## 2024-09-09 LAB — HEPATITIS C ANTIBODY: Hep C Virus Ab: NONREACTIVE

## 2024-09-09 LAB — HEPATITIS B SURFACE ANTIGEN: Hepatitis B Surface Ag: NEGATIVE

## 2024-09-09 LAB — HIV ANTIBODY (ROUTINE TESTING W REFLEX): HIV Screen 4th Generation wRfx: NONREACTIVE

## 2024-09-09 LAB — SYPHILIS: RPR W/REFLEX TO RPR TITER AND TREPONEMAL ANTIBODIES, TRADITIONAL SCREENING AND DIAGNOSIS ALGORITHM: RPR Ser Ql: NONREACTIVE

## 2024-09-10 LAB — CERVICOVAGINAL ANCILLARY ONLY
Bacterial Vaginitis (gardnerella): NEGATIVE
Candida Glabrata: NEGATIVE
Candida Vaginitis: NEGATIVE
Chlamydia: NEGATIVE
Comment: NEGATIVE
Comment: NEGATIVE
Comment: NEGATIVE
Comment: NEGATIVE
Comment: NEGATIVE
Comment: NORMAL
Neisseria Gonorrhea: NEGATIVE
Trichomonas: NEGATIVE

## 2024-09-11 LAB — CYTOLOGY - PAP: Diagnosis: NEGATIVE

## 2024-09-12 LAB — URINE CULTURE
